# Patient Record
Sex: Female | Born: 1990
Health system: Southern US, Community
[De-identification: ages and names within clinical notes are randomized; demographics above are authoritative.]

## PROBLEM LIST (undated history)

## (undated) ENCOUNTER — Inpatient Hospital Stay (HOSPITAL_COMMUNITY): Payer: Self-pay

## (undated) ENCOUNTER — Emergency Department (HOSPITAL_COMMUNITY): Admission: EM | Payer: Medicaid Other

## (undated) DIAGNOSIS — M549 Dorsalgia, unspecified: Secondary | ICD-10-CM

## (undated) DIAGNOSIS — G43909 Migraine, unspecified, not intractable, without status migrainosus: Secondary | ICD-10-CM

## (undated) DIAGNOSIS — F209 Schizophrenia, unspecified: Secondary | ICD-10-CM

## (undated) DIAGNOSIS — D509 Iron deficiency anemia, unspecified: Secondary | ICD-10-CM

## (undated) DIAGNOSIS — E071 Dyshormogenetic goiter: Secondary | ICD-10-CM

## (undated) DIAGNOSIS — I499 Cardiac arrhythmia, unspecified: Secondary | ICD-10-CM

## (undated) DIAGNOSIS — G8929 Other chronic pain: Secondary | ICD-10-CM

## (undated) DIAGNOSIS — J45909 Unspecified asthma, uncomplicated: Secondary | ICD-10-CM

## (undated) DIAGNOSIS — M419 Scoliosis, unspecified: Secondary | ICD-10-CM

## (undated) DIAGNOSIS — F329 Major depressive disorder, single episode, unspecified: Secondary | ICD-10-CM

## (undated) DIAGNOSIS — Z9889 Other specified postprocedural states: Secondary | ICD-10-CM

## (undated) DIAGNOSIS — N83209 Unspecified ovarian cyst, unspecified side: Secondary | ICD-10-CM

## (undated) DIAGNOSIS — T4145XA Adverse effect of unspecified anesthetic, initial encounter: Secondary | ICD-10-CM

## (undated) DIAGNOSIS — K219 Gastro-esophageal reflux disease without esophagitis: Secondary | ICD-10-CM

## (undated) DIAGNOSIS — F32A Depression, unspecified: Secondary | ICD-10-CM

## (undated) DIAGNOSIS — O139 Gestational [pregnancy-induced] hypertension without significant proteinuria, unspecified trimester: Secondary | ICD-10-CM

## (undated) DIAGNOSIS — F419 Anxiety disorder, unspecified: Secondary | ICD-10-CM

## (undated) DIAGNOSIS — T8859XA Other complications of anesthesia, initial encounter: Secondary | ICD-10-CM

## (undated) HISTORY — PX: UPPER GI ENDOSCOPY: SHX6162

---
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
## 2011-02-11 ENCOUNTER — Emergency Department (INDEPENDENT_AMBULATORY_CARE_PROVIDER_SITE_OTHER)
Admission: EM | Admit: 2011-02-11 | Discharge: 2011-02-11 | Disposition: A | Discharge status: Home / Self Care | Payer: Self-pay | Source: Home / Self Care | Attending: Emergency Medicine | Admitting: Emergency Medicine

## 2011-02-11 ENCOUNTER — Encounter (HOSPITAL_COMMUNITY): Payer: Self-pay | Admitting: Emergency Medicine

## 2011-02-11 DIAGNOSIS — R109 Unspecified abdominal pain: Secondary | ICD-10-CM

## 2011-02-11 LAB — POCT URINALYSIS DIP (DEVICE)
Nitrite: NEGATIVE
Protein, ur: NEGATIVE mg/dL
Specific Gravity, Urine: 1.025 (ref 1.005–1.030)
Urobilinogen, UA: 1 mg/dL (ref 0.0–1.0)
pH: 5 (ref 5.0–8.0)

## 2011-02-11 LAB — WET PREP, GENITAL: Yeast Wet Prep HPF POC: NONE SEEN

## 2011-02-11 NOTE — ED Provider Notes (Signed)
History     CSN: [id number]  Arrival date & time 02/11/11  1341   First MD Initiated Contact with Patient 02/11/11 1410      Chief Complaint  Patient presents with  . Abdominal Pain    (Consider location/radiation/quality/duration/timing/severity/associated sxs/prior treatment) HPI Comments: Patient with intermittent nonradiating midline lower and left lower quadrant pain x1 week. Patient states pain lasts for several minutes and then resolves. Patient not affected with movement, eating, defecation, urination. No abdominal distention, fevers, vaginal bleeding, vaginal discharge, urinary complaints. Last bowel movement earlier today, and was within normal limits for patient. Taking it 100 mg of ibuprofen with mild relief. Patient has a history of irregular menses, last menstrual period in 11 2012. Patient sexually active with one female partner, who is asymptomatic. Does not use condoms, as is allergic to latex. Patient does not use any other form of birth control. Patient has not yet been seen by on OB/GYN but has a referral from her primary care physician..  The history is provided by the patient.    Past Medical History  Diagnosis Date  . Gastric reflux   . Arthritis   . Hyperthyroidism     History reviewed. No pertinent past surgical history.  History reviewed. No pertinent family history.  History  Substance Use Topics  . Smoking status: Passive Smoker  . Smokeless tobacco: Never Used  . Alcohol Use: No    OB History    Grav Para Term Preterm Abortions TAB SAB Ect Mult Living                  Review of Systems  Allergies  Augmentin; Keflex; Latex; and Vicodin  Home Medications   Current Outpatient Rx  Name Route Sig Dispense Refill  . BETHANECHOL CHLORIDE 5 MG PO TABS Oral Take 5 mg by mouth 3 (three) times daily.      . IBUPROFEN 800 MG PO TABS Oral Take 800 mg by mouth every 8 (eight) hours as needed.    Marland Kitchen LANSOPRAZOLE 30 MG PO CPDR Oral Take 30 mg by mouth  2 (two) times daily.      Marland Kitchen SYNTHROID 150 MCG PO TABS  TAKE ONE TABLET BY MOUTH DAILY 30 tablet 0  . ARIPIPRAZOLE 5 MG PO TABS Oral Take 5 mg by mouth daily.      Marland Kitchen FLUOXETINE HCL 20 MG PO TABS Oral Take 20 mg by mouth daily.        BP 117/83  Pulse 106  Temp(Src) 99.4 F (37.4 C) (Oral)  Resp 20  SpO2 100%  LMP 12/15/2010  Physical Exam  Nursing note and vitals reviewed. Constitutional: She is oriented to person, place, and time. She appears well-developed and well-nourished. No distress.  HENT:  Head: Normocephalic and atraumatic.  Eyes: Conjunctivae and EOM are normal.  Neck: Normal range of motion. Neck supple.  Cardiovascular: Normal rate, regular rhythm and normal heart sounds.   Pulmonary/Chest: Effort normal and breath sounds normal.  Abdominal: Soft. Bowel sounds are normal. She exhibits no distension. There is tenderness in the left lower quadrant. There is no rigidity, no rebound, no guarding, no CVA tenderness and negative Murphy's sign.  Genitourinary: Uterus normal. Pelvic exam was performed with patient supine. There is no rash on the right labia. There is no rash on the left labia. Uterus is not tender. Cervix exhibits no motion tenderness, no discharge and no friability. Right adnexum displays no mass, no tenderness and no fullness. Left adnexum displays tenderness. Left  adnexum displays no mass and no fullness. No erythema, tenderness or bleeding around the vagina. No foreign body around the vagina. No vaginal discharge found.       Thin white oderous/nonoderous  vaginal d/c.Chaperone present during exam  Musculoskeletal: Normal range of motion.  Neurological: She is alert and oriented to person, place, and time.  Skin: Skin is warm and dry.  Psychiatric: She has a normal mood and affect. Her behavior is normal. Judgment and thought content normal.    ED Course  Procedures (including critical care time)  Labs Reviewed  POCT URINALYSIS DIP (DEVICE) - Abnormal;  Notable for the following:    Bilirubin Urine SMALL (*)    Ketones, ur TRACE (*)    Hgb urine dipstick TRACE (*)    All other components within normal limits  POCT PREGNANCY, URINE  POCT PREGNANCY, URINE  POCT URINALYSIS DIPSTICK  POCT URINALYSIS DIPSTICK  WET PREP, GENITAL  POCT PREGNANCY, URINE  GC/CHLAMYDIA PROBE AMP, GENITAL   No results found.   1. Abdominal pain     Results for orders placed during the hospital encounter of 02/11/11  POCT URINALYSIS DIP (DEVICE)      Component Value Range   Glucose, UA NEGATIVE  NEGATIVE (mg/dL)   Bilirubin Urine SMALL (*) NEGATIVE    Ketones, ur TRACE (*) NEGATIVE (mg/dL)   Specific Gravity, Urine 1.025  1.005 - 1.030    Hgb urine dipstick TRACE (*) NEGATIVE    pH 5.0  5.0 - 8.0    Protein, ur NEGATIVE  NEGATIVE (mg/dL)   Urobilinogen, UA 1.0  0.0 - 1.0 (mg/dL)   Nitrite NEGATIVE  NEGATIVE    Leukocytes, UA NEGATIVE  NEGATIVE   POCT PREGNANCY, URINE      Component Value Range   Preg Test, Ur NEGATIVE       MDM  Abdomen soft, nontender, nondistended. Mild left adnexal pain. No left lower quadrant pain. McMurray's point negative. Send off for gonorrhea, chlamydia, wet prep. Think that this is most likely mittelschmerz.  no evidence of torsion based on H&P.patient appropriate for outpatient ultrasound. Discussed with patient and mother the symptoms of appendicitis,  ovarian torsion. Discussed when she needs to return to the ED. Patient agrees with plan.   Luiz Blare, MD 02/11/11 1743

## 2011-02-11 NOTE — ED Notes (Signed)
Pt having nausea and vomiting for one month. Stomach pain that started about 1 week ago. She has been urinating more, no dysuria. She sometimes has discharge, but not often. She last had her period 12/15/10, but states it's not always completely regular. She states she had 1 day of period 01/14/11 but after one day it went away which is unusual.

## 2011-02-12 LAB — GC/CHLAMYDIA PROBE AMP, GENITAL
Chlamydia, DNA Probe: NEGATIVE
GC Probe Amp, Genital: NEGATIVE

## 2011-02-20 ENCOUNTER — Other Ambulatory Visit: Payer: Self-pay | Admitting: *Deleted

## 2011-02-20 DIAGNOSIS — E038 Other specified hypothyroidism: Secondary | ICD-10-CM

## 2011-02-21 LAB — T3, FREE: T3, Free: 3 pg/mL (ref 2.3–4.2)

## 2011-02-21 LAB — TSH: TSH: 0.431 u[IU]/mL (ref 0.350–4.500)

## 2011-02-27 ENCOUNTER — Encounter (HOSPITAL_COMMUNITY): Payer: Self-pay | Admitting: Psychology

## 2011-02-27 DIAGNOSIS — F39 Unspecified mood [affective] disorder: Secondary | ICD-10-CM

## 2011-02-27 NOTE — Progress Notes (Signed)
Outpatient Therapist Discharge Summary  Kimberly [name]    10/22/1990   Admission Date: 07/31/10   Discharge Date:  02/27/11 Reason for Discharge:  Not active w/ treatment; No shows Medications:  See chart Diagnosis:  Axis I:   1. Unspecified episodic mood disorder     Axis II:  v71.09  Axis III:  hypothyroid  Axis IV:  Primary supports and social  Axis V:  Unknown at d/c  Comments:  Last attended counseling on 09/10/10; 2 noshows  Forde Radon

## 2011-03-01 ENCOUNTER — Telehealth: Payer: Self-pay | Admitting: "Endocrinology

## 2011-03-01 DIAGNOSIS — F329 Major depressive disorder, single episode, unspecified: Secondary | ICD-10-CM | POA: Insufficient documentation

## 2011-03-01 DIAGNOSIS — E063 Autoimmune thyroiditis: Secondary | ICD-10-CM | POA: Insufficient documentation

## 2011-03-01 DIAGNOSIS — L83 Acanthosis nigricans: Secondary | ICD-10-CM

## 2011-03-01 DIAGNOSIS — K219 Gastro-esophageal reflux disease without esophagitis: Secondary | ICD-10-CM | POA: Insufficient documentation

## 2011-03-01 DIAGNOSIS — N915 Oligomenorrhea, unspecified: Secondary | ICD-10-CM

## 2011-03-01 DIAGNOSIS — E282 Polycystic ovarian syndrome: Secondary | ICD-10-CM | POA: Insufficient documentation

## 2011-03-01 DIAGNOSIS — L680 Hirsutism: Secondary | ICD-10-CM | POA: Insufficient documentation

## 2011-03-01 DIAGNOSIS — F319 Bipolar disorder, unspecified: Secondary | ICD-10-CM | POA: Insufficient documentation

## 2011-03-01 DIAGNOSIS — E049 Nontoxic goiter, unspecified: Secondary | ICD-10-CM | POA: Insufficient documentation

## 2011-03-01 DIAGNOSIS — I10 Essential (primary) hypertension: Secondary | ICD-10-CM | POA: Insufficient documentation

## 2011-03-01 DIAGNOSIS — R1013 Epigastric pain: Secondary | ICD-10-CM | POA: Insufficient documentation

## 2011-03-01 HISTORY — DX: Acanthosis nigricans: L83

## 2011-03-01 HISTORY — DX: Oligomenorrhea, unspecified: N91.5

## 2011-03-01 NOTE — Telephone Encounter (Signed)
I tried to contact the mother. Her home phone will not accept incoming calls. I left a VM message on her cell phone. 1. TFTs from June, whih we could not locate in October, were normal.

## 2011-03-18 ENCOUNTER — Telehealth: Payer: Self-pay | Admitting: *Deleted

## 2011-03-18 NOTE — Telephone Encounter (Signed)
Spoke with Mother: 1. Kimberly [name] is currently on brand name Synthroid 150 mcg daily. 2. No insurance at the moment.  Mother is trying to get Medicaid reinstated. 3. Requests samples of Synthroid 150 mcg.  Discussed above with Dr. Fransico Michael.  We do not have any samples of Synthroid 150 mcg, but we do have Synthroid 125 mcg samples. Per Dr. Fransico Michael: 1. Take Synthroid 125 mcg 1 tablets daily Monday - Friday (5 days/week). 2. Saturday and Sunday take 1.5 tablets of Synthroid 125 mcg (2 days/week). 3. Samples will be left at [facility] for mother to pick up.

## 2011-04-06 ENCOUNTER — Other Ambulatory Visit: Payer: Self-pay | Admitting: *Deleted

## 2011-04-06 DIAGNOSIS — E038 Other specified hypothyroidism: Secondary | ICD-10-CM

## 2011-04-22 ENCOUNTER — Emergency Department (HOSPITAL_COMMUNITY)
Admission: EM | Admit: 2011-04-22 | Discharge: 2011-04-22 | Disposition: A | Discharge status: Home / Self Care | Payer: Self-pay | Attending: Emergency Medicine | Admitting: Emergency Medicine

## 2011-04-22 ENCOUNTER — Emergency Department (HOSPITAL_COMMUNITY): Payer: Self-pay

## 2011-04-22 ENCOUNTER — Encounter (HOSPITAL_COMMUNITY): Payer: Self-pay | Admitting: Emergency Medicine

## 2011-04-22 DIAGNOSIS — E0590 Thyrotoxicosis, unspecified without thyrotoxic crisis or storm: Secondary | ICD-10-CM | POA: Insufficient documentation

## 2011-04-22 DIAGNOSIS — R1915 Other abnormal bowel sounds: Secondary | ICD-10-CM | POA: Insufficient documentation

## 2011-04-22 DIAGNOSIS — R1031 Right lower quadrant pain: Secondary | ICD-10-CM | POA: Insufficient documentation

## 2011-04-22 DIAGNOSIS — N83209 Unspecified ovarian cyst, unspecified side: Secondary | ICD-10-CM | POA: Insufficient documentation

## 2011-04-22 DIAGNOSIS — R112 Nausea with vomiting, unspecified: Secondary | ICD-10-CM | POA: Insufficient documentation

## 2011-04-22 DIAGNOSIS — Z79899 Other long term (current) drug therapy: Secondary | ICD-10-CM | POA: Insufficient documentation

## 2011-04-22 DIAGNOSIS — R10819 Abdominal tenderness, unspecified site: Secondary | ICD-10-CM | POA: Insufficient documentation

## 2011-04-22 LAB — URINALYSIS, ROUTINE W REFLEX MICROSCOPIC
Bilirubin Urine: NEGATIVE
Glucose, UA: NEGATIVE mg/dL
Ketones, ur: 15 mg/dL — AB
Leukocytes, UA: NEGATIVE
Nitrite: NEGATIVE
Protein, ur: NEGATIVE mg/dL
Specific Gravity, Urine: 1.03 (ref 1.005–1.030)
Urobilinogen, UA: 1 mg/dL (ref 0.0–1.0)
pH: 6 (ref 5.0–8.0)

## 2011-04-22 LAB — URINE MICROSCOPIC-ADD ON

## 2011-04-22 LAB — BASIC METABOLIC PANEL
BUN: 9 mg/dL (ref 6–23)
CO2: 23 mEq/L (ref 19–32)
Calcium: 9.3 mg/dL (ref 8.4–10.5)
Chloride: 104 mEq/L (ref 96–112)
Creatinine, Ser: 0.48 mg/dL — ABNORMAL LOW (ref 0.50–1.10)
GFR calc Af Amer: >90 mL/min (ref >90)
GFR calc non Af Amer: >90 mL/min (ref >90)
Glucose, Bld: 100 mg/dL — ABNORMAL HIGH (ref 70–99)
Potassium: 3.6 mEq/L (ref 3.5–5.1)
Sodium: 140 mEq/L (ref 135–145)

## 2011-04-22 LAB — WET PREP, GENITAL
Clue Cells Wet Prep HPF POC: NONE SEEN
Trich, Wet Prep: NONE SEEN
Yeast Wet Prep HPF POC: NONE SEEN

## 2011-04-22 LAB — CBC
HCT: 35 % — ABNORMAL LOW (ref 36.0–46.0)
Hemoglobin: 11.5 g/dL — ABNORMAL LOW (ref 12.0–15.0)
MCH: 27.1 pg (ref 26.0–34.0)
MCHC: 32.9 g/dL (ref 30.0–36.0)
MCV: 82.4 fL (ref 78.0–100.0)
Platelets: 368 10*3/uL (ref 150–400)
RBC: 4.25 MIL/uL (ref 3.87–5.11)
RDW: 13.4 % (ref 11.5–15.5)
WBC: 15.8 10*3/uL — ABNORMAL HIGH (ref 4.0–10.5)

## 2011-04-22 LAB — GC/CHLAMYDIA PROBE AMP, GENITAL
Chlamydia, DNA Probe: NEGATIVE
GC Probe Amp, Genital: NEGATIVE

## 2011-04-22 MED ORDER — ONDANSETRON HCL 4 MG/2ML IJ SOLN
4.0000 mg | Freq: Once | INTRAMUSCULAR | Status: AC
  Administered 2011-04-22: 4 mg via INTRAVENOUS
  Filled 2011-04-22: qty 2

## 2011-04-22 MED ORDER — OXYCODONE-ACETAMINOPHEN 5-325 MG PO TABS: 1.0000 | ORAL_TABLET | ORAL | Status: AC | PRN

## 2011-04-22 MED ORDER — MORPHINE SULFATE 4 MG/ML IJ SOLN
8.0000 mg | Freq: Once | INTRAMUSCULAR | Status: AC
  Administered 2011-04-22: 8 mg via INTRAVENOUS
  Filled 2011-04-22: qty 2

## 2011-04-22 MED ORDER — SODIUM CHLORIDE 0.9 % IV BOLUS (SEPSIS)
1000.0000 mL | Freq: Once | INTRAVENOUS | Status: AC
  Administered 2011-04-22: 1000 mL via INTRAVENOUS

## 2011-04-22 MED ORDER — IOHEXOL 300 MG/ML  SOLN
100.0000 mL | Freq: Once | INTRAMUSCULAR | Status: AC | PRN
  Administered 2011-04-22: 100 mL via INTRAVENOUS

## 2011-04-22 MED ORDER — IOHEXOL 300 MG/ML  SOLN: 20.0000 mL | INTRAMUSCULAR | Status: AC

## 2011-04-22 NOTE — ED Notes (Signed)
Pt discharged to home with mother in stable condition. Pt wheeled to car in wheelchair, pt's mother is driving. Discharge instructions provided to pt. Pt denies any questions at this time.

## 2011-04-22 NOTE — ED Notes (Signed)
Pt reports lower abdominal pain that is cramping; intermittent; [place]. LMP was 3/27th. No vaginal discharge. Vomited a few days ago that pt reports was associated with the pain. Appetite is lessened.

## 2011-04-22 NOTE — ED Notes (Signed)
CT notified that pt has completed contrast °

## 2011-04-22 NOTE — Discharge Instructions (Signed)
Abdominal Pain  Abdominal pain can be caused by many things. Your caregiver decides the seriousness of your pain by an examination and possibly blood tests and X-rays. Many cases can be observed and treated at home. Most abdominal pain is not caused by a disease and will probably improve without treatment. However, in many cases, more time must pass before a clear cause of the pain can be found. Before that point, it may not be known if you need more testing, or if hospitalization or surgery is needed.  HOME CARE INSTRUCTIONS    Do not take laxatives unless directed by your caregiver.   Take pain medicine only as directed by your caregiver.   Only take over-the-counter or prescription medicines for pain, discomfort, or fever as directed by your caregiver.   Try a clear liquid diet (broth, tea, or water) for as long as directed by your caregiver. Slowly move to a bland diet as tolerated.  SEEK IMMEDIATE MEDICAL CARE IF:    The pain does not go away.   You have a fever.   You keep throwing up (vomiting).   The pain is felt only in portions of the abdomen. Pain in the right side could possibly be appendicitis. In an adult, pain in the left lower portion of the abdomen could be colitis or diverticulitis.   You pass bloody or black tarry stools.  MAKE SURE YOU:    Understand these instructions.   Will watch your condition.   Will get help right away if you are not doing well or get worse.  Document Released: 10/15/2004 Document Revised: 12/25/2010 Document Reviewed: 08/24/2007  Exit[employer] Patient Information 2012 [employer]  Ovarian Cyst  The ovaries are small organs that are on each side of the uterus. The ovaries are the organs that produce the female hormones, estrogen and progesterone. An ovarian cyst is a sac filled with fluid that can vary in its size. It is normal for a small cyst to form in women who are in the childbearing age and who have menstrual periods. This type of cyst is called a follicle  cyst that becomes an ovulation cyst (corpus luteum cyst) after it produces the women's egg. It later goes away on its own if the woman does not become pregnant. There are other kinds of ovarian cysts that may cause problems and may need to be treated. The most serious problem is a cyst with cancer. It should be noted that menopausal women who have an ovarian cyst are at a higher risk of it being a cancer cyst. They should be evaluated very quickly, thoroughly and followed closely. This is especially true in menopausal women because of the high rate of ovarian cancer in women in menopause.  CAUSES AND TYPES OF OVARIAN CYSTS:   FUNCTIONAL CYST: The follicle/corpus luteum cyst is a functional cyst that occurs every month during ovulation with the menstrual cycle. They go away with the next menstrual cycle if the woman does not get pregnant. Usually, there are no symptoms with a functional cyst.   ENDOMETRIOMA CYST: This cyst develops from the lining of the uterus tissue. This cyst gets in or on the ovary. It grows every month from the bleeding during the menstrual period. It is also called a "chocolate cyst" because it becomes filled with blood that turns brown. This cyst can cause pain in the lower abdomen during intercourse and with your menstrual period.   CYSTADENOMA CYST: This cyst develops from the cells on the outside   of the ovary. They usually are not cancerous. They can get very big and cause lower abdomen pain and pain with intercourse. This type of cyst can twist on itself, cut off its blood supply and cause severe pain. It also can easily rupture and cause a lot of pain.   DERMOID CYST: This type of cyst is sometimes found in both ovaries. They are found to have different kinds of body tissue in the cyst. The tissue includes skin, teeth, hair, and/or cartilage. They usually do not have symptoms unless they get very big. Dermoid cysts are rarely cancerous.   POLYCYSTIC OVARY: This is a rare condition  with hormone problems that produces many small cysts on both ovaries. The cysts are follicle-like cysts that never produce an egg and become a corpus luteum. It can cause an increase in body weight, infertility, acne, increase in body and facial hair and lack of menstrual periods or rare menstrual periods. Many women with this problem develop type 2 diabetes. The exact cause of this problem is unknown. A polycystic ovary is rarely cancerous.   THECA LUTEIN CYST: Occurs when too much hormone (human chorionic gonadotropin) is produced and over-stimulates the ovaries to produce an egg. They are frequently seen when doctors stimulate the ovaries for invitro-fertilization (test tube babies).   LUTEOMA CYST: This cyst is seen during pregnancy. Rarely it can cause an obstruction to the birth canal during labor and delivery. They usually go away after delivery.  SYMPTOMS    Pelvic pain or pressure.   Pain during sexual intercourse.   Increasing girth (swelling) of the abdomen.   Abnormal menstrual periods.   Increasing pain with menstrual periods.   You stop having menstrual periods and you are not pregnant.  DIAGNOSIS   The diagnosis can be made during:   Routine or annual pelvic examination (common).   Ultrasound.   X-ray of the pelvis.   CT Scan.   MRI.   Blood tests.  TREATMENT    Treatment may only be to follow the cyst monthly for 2 to 3 months with your caregiver. Many go away on their own, especially functional cysts.   May be aspirated (drained) with a long needle with ultrasound, or by laparoscopy (inserting a tube into the pelvis through a small incision).   The whole cyst can be removed by laparoscopy.   Sometimes the cyst may need to be removed through an incision in the lower abdomen.   Hormone treatment is sometimes used to help dissolve certain cysts.   Birth control pills are sometimes used to help dissolve certain cysts.  HOME CARE INSTRUCTIONS   Follow your caregiver's advice  regarding:   Medicine.   Follow up visits to evaluate and treat the cyst.   You may need to come back or make an appointment with another caregiver, to find the exact cause of your cyst, if your caregiver is not a gynecologist.   Get your yearly and recommended pelvic examinations and Pap tests.   Let your caregiver know if you have had an ovarian cyst in the past.  SEEK MEDICAL CARE IF:    Your periods are late, irregular, they stop, or are painful.   Your stomach (abdomen) or pelvic pain does not go away.   Your stomach becomes larger or swollen.   You have pressure on your bladder or trouble emptying your bladder completely.   You have painful sexual intercourse.   You have feelings of fullness, pressure, or discomfort in your   stomach.   You lose weight for no apparent reason.   You feel generally ill.   You become constipated.   You lose your appetite.   You develop acne.   You have an increase in body and facial hair.   You are gaining weight, without changing your exercise and eating habits.   You think you are pregnant.  SEEK IMMEDIATE MEDICAL CARE IF:    You have increasing abdominal pain.   You feel sick to your stomach (nausea) and/or vomit.   You develop a fever that comes on suddenly.   You develop abdominal pain during a bowel movement.   Your menstrual periods become heavier than usual.  Document Released: 01/05/2005 Document Revised: 12/25/2010 Document Reviewed: 11/08/2008  [employer] Patient Information 2012 [employer].

## 2011-04-22 NOTE — ED Notes (Signed)
Pt complains of RLQ pain with nausea/ vomiting for approximately 30 mins. Pt denies diarrhea or urinary symptoms. Pt has tenderness to RLQ.

## 2011-04-25 LAB — T4, FREE: Free T4: 1.47 ng/dL (ref 0.80–1.80)

## 2011-04-28 ENCOUNTER — Encounter: Payer: Self-pay | Admitting: "Endocrinology

## 2011-04-28 ENCOUNTER — Ambulatory Visit (INDEPENDENT_AMBULATORY_CARE_PROVIDER_SITE_OTHER): Payer: Self-pay | Admitting: "Endocrinology

## 2011-04-28 VITALS — BP 109/64 | HR 96 | Wt 221.4 lb

## 2011-04-28 DIAGNOSIS — R1013 Epigastric pain: Secondary | ICD-10-CM

## 2011-04-28 DIAGNOSIS — K3189 Other diseases of stomach and duodenum: Secondary | ICD-10-CM

## 2011-04-28 DIAGNOSIS — E063 Autoimmune thyroiditis: Secondary | ICD-10-CM

## 2011-04-28 DIAGNOSIS — I10 Essential (primary) hypertension: Secondary | ICD-10-CM

## 2011-04-28 DIAGNOSIS — R7303 Prediabetes: Secondary | ICD-10-CM

## 2011-04-28 DIAGNOSIS — F419 Anxiety disorder, unspecified: Secondary | ICD-10-CM

## 2011-04-28 DIAGNOSIS — L680 Hirsutism: Secondary | ICD-10-CM

## 2011-04-28 DIAGNOSIS — E669 Obesity, unspecified: Secondary | ICD-10-CM

## 2011-04-28 DIAGNOSIS — D649 Anemia, unspecified: Secondary | ICD-10-CM

## 2011-04-28 DIAGNOSIS — F329 Major depressive disorder, single episode, unspecified: Secondary | ICD-10-CM

## 2011-04-28 DIAGNOSIS — N915 Oligomenorrhea, unspecified: Secondary | ICD-10-CM

## 2011-04-28 DIAGNOSIS — E038 Other specified hypothyroidism: Secondary | ICD-10-CM

## 2011-04-28 DIAGNOSIS — E049 Nontoxic goiter, unspecified: Secondary | ICD-10-CM

## 2011-04-28 DIAGNOSIS — F32A Depression, unspecified: Secondary | ICD-10-CM

## 2011-04-28 DIAGNOSIS — R7309 Other abnormal glucose: Secondary | ICD-10-CM

## 2011-04-28 DIAGNOSIS — F341 Dysthymic disorder: Secondary | ICD-10-CM

## 2011-04-28 NOTE — Patient Instructions (Addendum)
Followup visit in 3 months. Please continue the eat right diet. Please resume walking for an hour or more per day. Please reduce Synthroid to one 137 mcg tablet, 6 days per week, skip Sunday. Please drink at least 88 ounce glasses of water per day.

## 2011-04-28 NOTE — Progress Notes (Signed)
CC: FU hypothyroid, thyroiditis, goiter, obesity, pre-DM, acanthosis, oligomenorrhea, hirsutism, hypertension, anxiety/depression, dyspepsia, GERD  HPI: 21 y.o. Caucasian young woman, accompanied by mom. 1. I have been following this patient since 06.30.08 for the above issues. Despite being intolerant to metformin, she progressively lost weight  down to 195 lbs by April 2010, but then began to slowly re-gain the weight. Her changes in thyroid hormone requirement and Synthroid dose have paralleled her weight changes. In 2008 her menses became more irregular, painful, and heavy. She also developed some facial hair of the upper lip, sideburns areas, and cheeks. Her GYN started her on Yaz and her PCOS-type complaints all improved. Unfortunately, she and her mother became concerned about the ads from lawyers about Dianah Field and so she took herself off Yaz several months ago. It took several months for her to then have a period and the mild facial hair problems recurred.  2. During the Spring of 2012 she was evaluated by Dr. Lucianne Muss, staff psychiatrist at [facility]. Dr. Lucianne Muss prescribed Abilify. Unfortunately, Latashia became very hungry and gained 10 lbs. She also became so apathetic that Dr. Lucianne Muss discontinued the Abilify and the Prozac she was taking as well.  Those changes resulted in her being paranoid, anxious, scared of interacting with others, and reclusive. She was not sleeping well and was very fatigued.  3. Her last [facility] clinic visit was on 10/28/10. She has had several colds during the Winter. On 04/22/11 she had severe RLQ pains, nausea, vomiting, and diarrhea. She was taken to the ED, where she was evaluated for possible appendicitis. CT scan was c/w mesenteric adenitis. There was also a suggestion of possible ovarian cyst rupture. She still has some residual diarrhea and RLQ pain, but is improved overall.  4. Pertinent Review of Systems: Constitutional  She has been quite depressed,  apathetic, and withdrawn. She no longer has Medicaid or any other insurance.  Eyes: She has been having more visual blurring lately. She saw Dr. Maple Hudson about 10 months ago. He did not think that she needed glasses at that point. I suggested that she may need to see him again, when she can afford to.  Neck: She sometimes has some difficulty swallowing. She has not had any recent problems with thyroid gland swelling, soreness, pain, or pressure.  Heart: She sometimes notes fast heart rates when she is anxious.  Gastrointestinal: She is often very hungry, but often feels too nauseated to eat. Bowel movents seem normal.  Legs: Muscle mass and strength seem normal. She complains of "random  numbness" of both legs.  Feet: She also has random numbness of the feet. GYN: LMP two weeks ago. Menses are quite heavy. She is not taking any oral contraceptives at this time.  PAST MEDICAL, FAMILY, AND SOCIAL HISTORY  1. Work and family: The patient is still unemployed. Her mood issues usually preclude her from interacting successfully with others. She was able to work for 1-1/2 months at [employer], but when business slowed down, she was terminated. - 2. Activities: She has not been exercising recently. 3. Primary care provider: She still has not obtained an adult  primary care provider.    ROS: There are no other significant problems involving her other body systems.  PHYSICAL EXAM: BP 109/64  Pulse 96  Wt 221 lb 6.4 oz (100.426 kg)  LMP 04/15/2011 Weight >> 97%. She has gained one pound since last visit. Constitutional: The patient looks pretty good today.  Eyes: There is  no arcus or proptosis.  Face: She has just a trace of moustache. She has some longer, slender dark sideburns hairs, about a 1+ score. Mouth: The oropharynx appears normal. The tongue appears normal. There is normal oral moisture. There is no obvious gingivitis. Neck: There are no bruits present. The thyroid gland appears normal. The  thyroid gland is approximately 20- 20+ grams in size. The right lobe is within normal. The left lobe is slightly enlarged. The consistency of the thyroid gland is normal. The thyroid gland was not tender to palpation today.  Lungs: The lungs are clear. Air movement is good. Heart: The heart rhythm and rate appear normal. Heart sounds S1 and S2 are normal. I do not appreciate any pathologic heart murmurs. Abdomen: The abdominal size is enlarged. Bowel sounds are normal. The abdomen is soft, but is mildly tender in the RLQ. There is no obviously palpable hepatomegaly, splenomegaly, or other masses.  Arms: Muscle mass appears appropriate for age.  Hands: There is no obvious tremor. Phalangeal and metacarpophalangeal joints appear normal. Palms show 1+ erythema.  Legs: Muscle mass appears appropriate for age. There is no edema.   Neurologic: Muscle strength is normal for age and gender  in both the upper and the lower extremities. Muscle tone appears normal. Sensation to touch is normal in the legs.  Labs: Hemoglobin A1c today is 4.9%, compared with 4.7% at last visit.           TFTs 02/20/11: TSH 0.431, free T4 1.48, free T3 3.0           TFTs 04/22/11: TSH 0.046, free T4 1.47, free T3 3.1           CBC 04/22/11: Hgb 11.5, Hct 35%.   ASSESSMENT: 1. Hypothyroid: She was euthyroid in March of 2012, borderline hyperthyroid in February 2013, and hyperthyroid by [facility] in April 2013. However, the free T4 and free T3 did not substantially change. This pattern is c/w a recent flare up of Hashimoto's disease. 2. Obesity: Weight is essentially unchanged since last visit. 3. Pre-DM: Her hemoglobin A1c is very slightly increased, but still mid-range normal. 4. Anxiety-depression: She looks, talks, and acts pretty normally today, but not as well as at last visit.  5. Thyroiditis: Her Hashimoto's Disease is clinically quiescent. 6. Goiter: Fairly static size, now only barely enlarged. 7. Hypertension: Her systolic BP  is fine. Her diastolic BP is fine. 8. Oligomenorrhea: Her periods have been regular since stopping Yaz, but also heavier. . 9. Hirsutism: Her hirsutism is very mild. I don't think it's any more significant than it was at last visit. 10: Memory problems: These problems have not really changed. It really sounds as if she doesn't have a problem with memory, but really has a problem with her ADD and its effects on her attention. If she doesn't attend to information, especially information she hears, she can't "save" that information.  11. Dyspepsia/GERD: Doing worse again due to increased carb intake. 12. Anemia: Hgb and Hct are slightly low. MCV is still normal. She may have early iron-deficiency anemia.  PLAN: 1. Diagnostic: Iron level and TFTs when she has health insurance benefits or is enrolled in [employer] program. 2. Therapeutic: Continue the Eat Right Diet. Resume walking 45-60 minutes per day. Reduce Synthroid to one 137 mcg pills 6 days per week, skip Sunday. Drink at least 8 8 oz glasses of water daily 3. Patient education: Discussed nutrition, exercise, obesity, and depression. We also discussed the [facility] charity program.  I gave her a handout about the program. She will call me as soon as she is approved.  4. Follow-up: 3 months  Level of Service: This visit lasted in excess of 40 minutes. More than 50% of the visit was devoted to counseling.  David Stall

## 2011-04-30 ENCOUNTER — Ambulatory Visit (INDEPENDENT_AMBULATORY_CARE_PROVIDER_SITE_OTHER): Payer: Self-pay | Admitting: Psychiatry

## 2011-04-30 ENCOUNTER — Encounter (HOSPITAL_COMMUNITY): Payer: Self-pay | Admitting: Psychiatry

## 2011-04-30 VITALS — BP 110/65 | HR 90 | Ht 64.0 in | Wt 220.8 lb

## 2011-04-30 DIAGNOSIS — F988 Other specified behavioral and emotional disorders with onset usually occurring in childhood and adolescence: Secondary | ICD-10-CM

## 2011-04-30 DIAGNOSIS — F410 Panic disorder [episodic paroxysmal anxiety] without agoraphobia: Secondary | ICD-10-CM

## 2011-04-30 DIAGNOSIS — F411 Generalized anxiety disorder: Secondary | ICD-10-CM

## 2011-04-30 DIAGNOSIS — F341 Dysthymic disorder: Secondary | ICD-10-CM

## 2011-04-30 MED ORDER — FLUOXETINE HCL 40 MG PO CAPS: 40.0000 mg | ORAL_CAPSULE | Freq: Every day | ORAL | Status: DC

## 2011-04-30 NOTE — Progress Notes (Signed)
[facility] [id number] Progress Note  Kimberly [name] [id number] 21 y.o.  04/30/2011 9:25 AM  Chief Complaint: I'm doing better with my depression but still suffer from anxiety at times.  History of Present Illness: Patient is a 21 year old female diagnosed with panic disorder and dysthymic disorder who presents today for a followup visit.  Patient says that she is doing well in regards to her depression. She however continues to struggle on and off with anxiety and panic-like symptoms and feels that her Prozac needs to be increased. She currently is unemployed as she was let go from her job due to decrease in the business. She however reports that her ex-employer did call her to ask her if she could work during the summer which is their busy season. She is also currently looking for jobs.  In regards to her medical problems which include her being prediabetic, having goiter, hypertension, GERD and oligomenorrhea patient says that she is stable.  Patient says that she currently does not have insurance as she has applied for Medicaid a few times and has been turned down. She adds that she was to be on medications which she can afford to buy. She denies any other complaints at this visit, any safety issues. Suicidal Ideation: No Plan Formed: No Patient has means to carry out plan: No  Homicidal Ideation: No Plan Formed: No Patient has means to carry out plan: No  Review of Systems: Psychiatric: Agitation: No Hallucination: No Depressed Mood: No Insomnia: No Hypersomnia: No Altered Concentration: No Feels Worthless: No Grandiose Ideas: No Belief In Special Powers: No New/Increased Substance Abuse: No Compulsions: No  Neurologic: Headache: No Seizure: No Paresthesias: No  Past Medical Family, Social History: Lives with mom and her sister and Kimberly [name]. Patient is currently unemployed  Outpatient Encounter Prescriptions as of 04/30/2011  Medication Sig  Dispense Refill  . bethanechol (URECHOLINE) 5 MG tablet Take 5 mg by mouth 3 (three) times daily.        Marland Kitchen FLUoxetine (PROZAC) 40 MG capsule Take 1 capsule (40 mg total) by mouth daily.  90 capsule  1  . lansoprazole (PREVACID) 30 MG capsule Take 30 mg by mouth 2 (two) times daily.        Marland Kitchen oxyCODONE-acetaminophen (PERCOCET) 5-325 MG per tablet Take 1-2 tablets by mouth every 4 (four) hours as needed for pain.  10 tablet  0  . SYNTHROID 150 MCG tablet TAKE ONE TABLET BY MOUTH DAILY  30 tablet  0  . DISCONTD: FLUoxetine (PROZAC) 20 MG tablet Take 20 mg by mouth daily.          Past Psychiatric History/Hospitalization(s): Anxiety: Yes Bipolar Disorder: No Depression: Yes Mania: No Psychosis: No Schizophrenia: No Personality Disorder: No Hospitalization for psychiatric illness: No History of Electroconvulsive Shock Therapy: No Prior Suicide Attempts: No  Physical Exam: Constitutional:  BP 110/65  Pulse 90  Ht 5\' 4"  (1.626 m)  Wt 220 lb 12.8 oz (100.154 kg)  BMI 37.90 kg/m2  LMP 04/15/2011  General Appearance: alert, oriented, no acute distress and obese  Musculoskeletal: Strength & Muscle Tone: within normal limits Gait & Station: normal Patient leans: N/A  Psychiatric: Speech (describe rate, volume, coherence, spontaneity, and abnormalities if any): Normal in volume, rate, tone, spontaneous   Thought Process (describe rate, content, abstract reasoning, and computation): Organized, goal directed, age appropriate   Associations: Intact  Thoughts: normal  Mental Status: Orientation: oriented to person, place, situation and day of week Mood & Affect:  normal affect Attention Span & Concentration: OK  Medical Decision Making (Choose Three): Review of Psycho-Social Stressors (1), Established Problem, Worsening (2), New Problem, with no additional work-up planned (3), Review of Last Therapy Session (1) and Review of New Medication or Change in Dosage (2)  Assessment: Axis  I: Panic disorder, dysthymic disorder, ADD inattentive type  Axis II: Deferred  Axis III: Obesity, goiter, thyroiditis, oligo menorrhea, prediabetes, Stein-Leventhal syndrome, hirsutism, GERD  Axis IV: Lack of employment, financial issues  Axis V: 65   Plan: Increase the Prozac to 40 mg in the morning to help with the anxiety as patient has had again problems with increased anxiety along with panic-like symptoms. Patient's depression seems to be stable Patient continues to struggle with her ADD as she reports that in her job she has to come up with ways to memorize her work so she does not forget anything. She adds that she does no want to try medications at this time as she is able to organize herself. Continue to see her medical specialist for her medical issues Call when necessary Followup in 4 weeks  Nelly Rout, MD 04/30/2011

## 2011-05-01 NOTE — ED Provider Notes (Signed)
History    20yf with with abdominal pain. RLQ. Onset a couple days ago. Initially intermittent but now more or less constant. No radiation. No fever or chills. No urinary complaints. No unusual vaginal bleeding or discharge. Does not think pregnant. Not particularly concerned for possible std.   CSN: [id number]  Arrival date & time 04/22/11  0309   First MD Initiated Contact with Patient 04/22/11 0340      Chief Complaint  Patient presents with  . Abdominal Pain    (Consider location/radiation/quality/duration/timing/severity/associated sxs/prior treatment) HPI  Past Medical History  Diagnosis Date  . Gastric reflux   . Arthritis   . Hyperthyroidism     No past surgical history on file.  No family history on file.  History  Substance Use Topics  . Smoking status: Passive Smoker  . Smokeless tobacco: Never Used  . Alcohol Use: No    OB History    Grav Para Term Preterm Abortions TAB SAB Ect Mult Living                  Review of Systems   Review of symptoms negative unless otherwise noted in HPI.   Allergies  Augmentin; Keflex; Latex; and Vicodin  Home Medications   Current Outpatient Rx  Name Route Sig Dispense Refill  . BETHANECHOL CHLORIDE 5 MG PO TABS Oral Take 5 mg by mouth 3 (three) times daily.      Marland Kitchen LANSOPRAZOLE 30 MG PO CPDR Oral Take 30 mg by mouth 2 (two) times daily.      Marland Kitchen SYNTHROID 150 MCG PO TABS  TAKE ONE TABLET BY MOUTH DAILY 30 tablet 0  . FLUOXETINE HCL 40 MG PO CAPS Oral Take 1 capsule (40 mg total) by mouth daily. 90 capsule 1  . OXYCODONE-ACETAMINOPHEN 5-325 MG PO TABS Oral Take 1-2 tablets by mouth every 4 (four) hours as needed for pain. 10 tablet 0    BP 125/69  Pulse 102  Temp(Src) 98 F (36.7 C) (Oral)  Resp 16  SpO2 99%  LMP 04/15/2011  Physical Exam  Nursing note and vitals reviewed. Constitutional: She is oriented to person, place, and time. She appears well-developed and well-nourished. No distress.  HENT:  Head:  Normocephalic and atraumatic.  Eyes: Conjunctivae are normal. Right eye exhibits no discharge. Left eye exhibits no discharge.  Neck: Neck supple.  Cardiovascular: Normal rate, regular rhythm and normal heart sounds.  Exam reveals no gallop and no friction rub.   No murmur heard. Pulmonary/Chest: Effort normal and breath sounds normal. No respiratory distress.  Abdominal: Soft. She exhibits no distension. There is tenderness. There is guarding. There is no rebound.       Moderate rlq tenderness with voluntaring guarding. No rebound. No mass.  Genitourinary:       No cva tenderness.  Musculoskeletal: She exhibits no edema and no tenderness.  Neurological: She is alert and oriented to person, place, and time.  Skin: Skin is warm and dry. She is not diaphoretic.  Psychiatric: She has a normal mood and affect. Her behavior is normal. Thought content normal.    ED Course  Procedures (including critical care time)  Labs Reviewed  URINALYSIS, ROUTINE W REFLEX MICROSCOPIC - Abnormal; Notable for the following:    APPearance CLOUDY (*)    Hgb urine dipstick TRACE (*)    Ketones, ur 15 (*)    All other components within normal limits  URINE MICROSCOPIC-ADD ON - Abnormal; Notable for the following:    Squamous  Epithelial / LPF MANY (*)    Bacteria, UA MANY (*)    Crystals CA OXALATE CRYSTALS (*)    All other components within normal limits  WET PREP, GENITAL - Abnormal; Notable for the following:    WBC, Wet Prep HPF POC FEW (*)    All other components within normal limits  CBC - Abnormal; Notable for the following:    WBC 15.8 (*)    Hemoglobin 11.5 (*)    HCT 35.0 (*)    All other components within normal limits  BASIC METABOLIC PANEL - Abnormal; Notable for the following:    Glucose, Bld 100 (*)    Creatinine, Ser 0.48 (*)    All other components within normal limits  POCT PREGNANCY, URINE  GC/CHLAMYDIA PROBE AMP, GENITAL  LAB REPORT - SCANNED   No results found.   1.  Abdominal pain   2. Ovarian cyst       MDM  20yf with abdominal pain. Possible ovarian cyst vs mesenteric adenitis. Low suspicion for surgical abdomen. Strict return precautions discussed. Outpt fu as needed.        Raeford Razor, MD 05/01/11 [phone]

## 2011-05-21 ENCOUNTER — Encounter (HOSPITAL_COMMUNITY): Payer: Self-pay

## 2011-05-21 ENCOUNTER — Ambulatory Visit (HOSPITAL_COMMUNITY): Payer: Self-pay | Admitting: Psychiatry

## 2011-06-04 ENCOUNTER — Encounter (HOSPITAL_COMMUNITY): Payer: Self-pay | Admitting: *Deleted

## 2011-06-04 ENCOUNTER — Emergency Department (HOSPITAL_COMMUNITY)
Admission: EM | Admit: 2011-06-04 | Discharge: 2011-06-05 | Disposition: A | Discharge status: Home / Self Care | Payer: Self-pay | Attending: Emergency Medicine | Admitting: Emergency Medicine

## 2011-06-04 DIAGNOSIS — K5289 Other specified noninfective gastroenteritis and colitis: Secondary | ICD-10-CM | POA: Insufficient documentation

## 2011-06-04 DIAGNOSIS — F341 Dysthymic disorder: Secondary | ICD-10-CM | POA: Insufficient documentation

## 2011-06-04 DIAGNOSIS — E0590 Thyrotoxicosis, unspecified without thyrotoxic crisis or storm: Secondary | ICD-10-CM | POA: Insufficient documentation

## 2011-06-04 DIAGNOSIS — K529 Noninfective gastroenteritis and colitis, unspecified: Secondary | ICD-10-CM

## 2011-06-04 DIAGNOSIS — Z8739 Personal history of other diseases of the musculoskeletal system and connective tissue: Secondary | ICD-10-CM | POA: Insufficient documentation

## 2011-06-04 DIAGNOSIS — R1013 Epigastric pain: Secondary | ICD-10-CM | POA: Insufficient documentation

## 2011-06-04 HISTORY — DX: Major depressive disorder, single episode, unspecified: F32.9

## 2011-06-04 HISTORY — DX: Depression, unspecified: F32.A

## 2011-06-04 HISTORY — DX: Scoliosis, unspecified: M41.9

## 2011-06-04 HISTORY — DX: Anxiety disorder, unspecified: F41.9

## 2011-06-04 LAB — CBC
HCT: 36 % (ref 36.0–46.0)
MCH: 27.5 pg (ref 26.0–34.0)
MCHC: 33.1 g/dL (ref 30.0–36.0)
MCV: 83.1 fL (ref 78.0–100.0)
RDW: 13.6 % (ref 11.5–15.5)

## 2011-06-04 LAB — URINALYSIS, ROUTINE W REFLEX MICROSCOPIC
Glucose, UA: 100 mg/dL — AB
Hgb urine dipstick: NEGATIVE
Ketones, ur: 15 mg/dL — AB
Nitrite: NEGATIVE
Protein, ur: 100 mg/dL — AB
Specific Gravity, Urine: 1.025 (ref 1.005–1.030)
Urobilinogen, UA: 4 mg/dL — ABNORMAL HIGH (ref 0.0–1.0)
pH: 6 (ref 5.0–8.0)

## 2011-06-04 LAB — URINE MICROSCOPIC-ADD ON

## 2011-06-04 LAB — POCT I-STAT, CHEM 8
BUN: 8 mg/dL (ref 6–23)
Calcium, Ion: 1.15 mmol/L (ref 1.12–1.32)
Chloride: 106 mEq/L (ref 96–112)
Creatinine, Ser: 0.5 mg/dL (ref 0.50–1.10)
Glucose, Bld: 111 mg/dL — ABNORMAL HIGH (ref 70–99)
HCT: 37 % (ref 36.0–46.0)
Hemoglobin: 12.6 g/dL (ref 12.0–15.0)
Potassium: 3.2 mEq/L — ABNORMAL LOW (ref 3.5–5.1)
Sodium: 143 meq/L (ref 135–145)
TCO2: 24 mmol/L (ref 0–100)

## 2011-06-04 LAB — DIFFERENTIAL
Basophils Absolute: 0 10*3/uL (ref 0.0–0.1)
Basophils Relative: 0 % (ref 0–1)
Eosinophils Absolute: 0.2 10*3/uL (ref 0.0–0.7)
Eosinophils Relative: 2 % (ref 0–5)
Monocytes Absolute: 0.8 10*3/uL (ref 0.1–1.0)
Neutro Abs: 7.2 10*3/uL (ref 1.7–7.7)

## 2011-06-04 LAB — POCT PREGNANCY, URINE: Preg Test, Ur: NEGATIVE

## 2011-06-04 MED ORDER — ONDANSETRON HCL 4 MG/2ML IJ SOLN
4.0000 mg | Freq: Once | INTRAMUSCULAR | Status: AC
  Administered 2011-06-04: 4 mg via INTRAVENOUS
  Filled 2011-06-04: qty 2

## 2011-06-04 MED ORDER — PROMETHAZINE HCL 25 MG PO TABS: 25.0000 mg | ORAL_TABLET | Freq: Four times a day (QID) | ORAL | Status: DC | PRN

## 2011-06-04 MED ORDER — SODIUM CHLORIDE 0.9 % IV BOLUS (SEPSIS)
1000.0000 mL | Freq: Once | INTRAVENOUS | Status: AC
  Administered 2011-06-04: 1000 mL via INTRAVENOUS

## 2011-06-04 NOTE — ED Provider Notes (Signed)
History     CSN: [id number]  Arrival date & time 06/04/11  2134   First MD Initiated Contact with Patient 06/04/11 2208      Chief Complaint  Patient presents with  . Emesis    (Consider location/radiation/quality/duration/timing/severity/associated sxs/prior treatment) HPI Comments: Patient here with about 4 day history of nausea, vomiting and diarrhea - she reports crampy epigastric pain and soreness with movement and vomiting - denies blood or bile in the vomit or blood in the diarrhea - she states that one of her co-workers left earlier in the week ill and has been out for the past 3 days with similar complaints.  Deneis fever, chills, lower abdominal pain, dysuria, hematuria, vaginal discharge - is currently menstruating at this time.  Patient is a 21 y.o. female presenting with vomiting. The history is provided by the patient. No language interpreter was used.  Emesis  This is a new problem. The current episode started more than 2 days ago. The problem occurs more than 10 times per day. The problem has not changed since onset.The emesis has an appearance of stomach contents. There has been no fever. Associated symptoms include abdominal pain. Pertinent negatives include no arthralgias, no chills, no cough, no diarrhea, no fever, no headaches, no myalgias, no sweats and no URI. Risk factors include ill contacts.    Past Medical History  Diagnosis Date  . Gastric reflux   . Arthritis   . Hyperthyroidism   . Anxiety   . Depression   . Scoliosis     History reviewed. No pertinent past surgical history.  History reviewed. No pertinent family history.  History  Substance Use Topics  . Smoking status: Never Smoker   . Smokeless tobacco: Never Used  . Alcohol Use: No    OB History    Grav Para Term Preterm Abortions TAB SAB Ect Mult Living                  Review of Systems  Constitutional: Negative for fever and chills.  Respiratory: Negative for cough.     Gastrointestinal: Positive for vomiting and abdominal pain. Negative for diarrhea.  Musculoskeletal: Negative for myalgias and arthralgias.  Neurological: Negative for headaches.  All other systems reviewed and are negative.    Allergies  Amoxicillin-pot clavulanate; Cephalexin; Latex; and Vicodin  Home Medications   Current Outpatient Rx  Name Route Sig Dispense Refill  . BETHANECHOL CHLORIDE 5 MG PO TABS Oral Take 5 mg by mouth 3 (three) times daily.      Marland Kitchen FLUOXETINE HCL 40 MG PO CAPS Oral Take 1 capsule (40 mg total) by mouth daily. 90 capsule 1  . LANSOPRAZOLE 30 MG PO CPDR Oral Take 30 mg by mouth 2 (two) times daily.      Marland Kitchen SYNTHROID 150 MCG PO TABS  TAKE ONE TABLET BY MOUTH DAILY 30 tablet 0    BP 123/83  Pulse 127  Temp(Src) 99.1 F (37.3 C) (Oral)  Resp 20  SpO2 98%  LMP 05/31/2011  Physical Exam  Nursing note and vitals reviewed. Constitutional: She is oriented to person, place, and time. She appears well-developed and well-nourished. No distress.  HENT:  Head: Normocephalic and atraumatic.  Right Ear: External ear normal.  Left Ear: External ear normal.  Nose: Nose normal.  Mouth/Throat: Oropharynx is clear and moist. No oropharyngeal exudate.  Eyes: Conjunctivae are normal. Pupils are equal, round, and reactive to light.  Neck: Normal range of motion. Neck supple.  Cardiovascular: Regular rhythm  and normal heart sounds.  Exam reveals no gallop and no friction rub.   No murmur heard.      tachycardia  Pulmonary/Chest: Effort normal and breath sounds normal. No respiratory distress. She has no wheezes. She has no rales. She exhibits no tenderness.  Abdominal: Soft. Bowel sounds are normal. She exhibits no distension and no mass. There is tenderness. There is no rebound and no guarding.    Musculoskeletal: Normal range of motion. She exhibits no edema and no tenderness.  Lymphadenopathy:    She has no cervical adenopathy.  Neurological: She is alert and  oriented to person, place, and time. No cranial nerve deficit.  Skin: Skin is warm and dry. No rash noted. No erythema. No pallor.  Psychiatric: She has a normal mood and affect. Her behavior is normal. Judgment and thought content normal.    ED Course  Procedures (including critical care time)  Labs Reviewed  URINALYSIS, ROUTINE W REFLEX MICROSCOPIC - Abnormal; Notable for the following:    Color, Urine RED (*) BIOCHEMICALS MAY BE AFFECTED BY COLOR   APPearance TURBID (*)    Glucose, UA 100 (*)    Bilirubin Urine MODERATE (*)    Ketones, ur 15 (*)    Protein, ur 100 (*)    Urobilinogen, UA 4.0 (*)    Leukocytes, UA TRACE (*)    All other components within normal limits  CBC - Abnormal; Notable for the following:    WBC 11.4 (*)    Hemoglobin 11.9 (*)    All other components within normal limits  URINE MICROSCOPIC-ADD ON - Abnormal; Notable for the following:    Squamous Epithelial / LPF FEW (*)    All other components within normal limits  POCT I-STAT, CHEM 8 - Abnormal; Notable for the following:    Potassium 3.2 (*)    Glucose, Bld 111 (*)    All other components within normal limits  DIFFERENTIAL   No results found.  Results for orders placed during the hospital encounter of 06/04/11  URINALYSIS, ROUTINE W REFLEX MICROSCOPIC      Component Value Range   Color, Urine RED (*) YELLOW    APPearance TURBID (*) CLEAR    Specific Gravity, Urine 1.025  1.005 - 1.030    pH 6.0  5.0 - 8.0    Glucose, UA 100 (*) NEGATIVE (mg/dL)   Hgb urine dipstick NEGATIVE  NEGATIVE    Bilirubin Urine MODERATE (*) NEGATIVE    Ketones, ur 15 (*) NEGATIVE (mg/dL)   Protein, ur 161 (*) NEGATIVE (mg/dL)   Urobilinogen, UA 4.0 (*) 0.0 - 1.0 (mg/dL)   Nitrite NEGATIVE  NEGATIVE    Leukocytes, UA TRACE (*) NEGATIVE   CBC      Component Value Range   WBC 11.4 (*) 4.0 - 10.5 (K/uL)   RBC 4.33  3.87 - 5.11 (MIL/uL)   Hemoglobin 11.9 (*) 12.0 - 15.0 (g/dL)   HCT 09.6  04.5 - 40.9 (%)   MCV 83.1   78.0 - 100.0 (fL)   MCH 27.5  26.0 - 34.0 (pg)   MCHC 33.1  30.0 - 36.0 (g/dL)   RDW 81.1  91.4 - 78.2 (%)   Platelets 373  150 - 400 (K/uL)  DIFFERENTIAL      Component Value Range   Neutrophils Relative 64  43 - 77 (%)   Neutro Abs 7.2  1.7 - 7.7 (K/uL)   Lymphocytes Relative 27  12 - 46 (%)   Lymphs Abs 3.1  0.7 - 4.0 (K/uL)   Monocytes Relative 7  3 - 12 (%)   Monocytes Absolute 0.8  0.1 - 1.0 (K/uL)   Eosinophils Relative 2  0 - 5 (%)   Eosinophils Absolute 0.2  0.0 - 0.7 (K/uL)   Basophils Relative 0  0 - 1 (%)   Basophils Absolute 0.0  0.0 - 0.1 (K/uL)  URINE MICROSCOPIC-ADD ON      Component Value Range   Squamous Epithelial / LPF FEW (*) RARE    WBC, UA 3-6  <3 (WBC/hpf)   RBC / HPF TOO NUMEROUS TO COUNT  <3 (RBC/hpf)   Bacteria, UA RARE  RARE   POCT I-STAT, CHEM 8      Component Value Range   Sodium 143  135 - 145 (mEq/L)   Potassium 3.2 (*) 3.5 - 5.1 (mEq/L)   Chloride 106  96 - 112 (mEq/L)   BUN 8  6 - 23 (mg/dL)   Creatinine, Ser 1.47  0.50 - 1.10 (mg/dL)   Glucose, Bld 829 (*) 70 - 99 (mg/dL)   Calcium, Ion 5.62  1.30 - 1.32 (mmol/L)   TCO2 24  0 - 100 (mmol/L)   Hemoglobin 12.6  12.0 - 15.0 (g/dL)   HCT 86.5  78.4 - 69.6 (%)  POCT PREGNANCY, URINE      Component Value Range   Preg Test, Ur NEGATIVE  NEGATIVE    No results found.   Gastroenteritis    MDM  Patient with soft and mildly tender abdomen, I do not suspect acute surgical emergency, here with symptoms c/w gastroenteritis - heart rate now returned to normal (81) - given a liter of fluids and zofran - able to tolerate po fluids - will discharge home with same.        Izola Price M[place], [place] 06/04/11 2357

## 2011-06-04 NOTE — Discharge Instructions (Signed)
Clear Liquid Diet The clear liquid dietconsists of foods that are liquid or will become liquid at room temperature.You should be able to see through the liquid and beverages. Examples of foods allowed on a clear liquid diet include fruit juice, broth or bouillon, gelatin, or frozen ice pops. The purpose of this diet is to provide necessary fluid, electrolytes such as sodium and potassium, and energy to keep the body functioning during times when you are not able to consume a regular diet.A clear liquid diet should not be continued for long periods of time as it is not nutritionally adequate.  REASONS FOR USING A CLEAR LIQUID DIET  In sudden onset (acute) conditions for a patient before or after surgery.   As the first step in oral feeding.   For fluid and electrolyte replacement in diarrheal diseases.   As a diet before certain medical tests are performed.  ADEQUACY The clear liquid diet is adequate only in ascorbic acid, according to the Recommended Dietary Allowances of the National Research Council. CHOOSING FOODS Breads and Starches  Allowed:  None are allowed.   Avoid: All are avoided.  Vegetables  Allowed:  Strained tomato or vegetable juice.   Avoid: Any others.  Fruit  Allowed:  Strained fruit juices and fruit drinks. Include 1 serving of citrus or vitamin C-enriched fruit juice daily.   Avoid: Any others.  Meat and Meat Substitutes  Allowed:  None are allowed.   Avoid: All are avoided.  Milk  Allowed:  None are allowed.   Avoid: All are avoided.  Soups and Combination Foods  Allowed:  Clear bouillon, broth, or strained broth-based soups.   Avoid: Any others.  Desserts and Sweets  Allowed:  Sugar, honey. High protein gelatin. Flavored gelatin, ices, or frozen ice pops that do not contain milk.   Avoid: Any others.  Fats and Oils  Allowed:  None are allowed.   Avoid: All are avoided.  Beverages  Allowed: Cereal beverages, coffee (regular or  decaffeinated), tea, or soda at the discretion of your caregiver.   Avoid: Any others.  Condiments  Allowed:  Iodized salt.   Avoid: Any others, including pepper.  Supplements  Allowed:  Liquid nutrition beverages.   Avoid: Any others that contain lactose or fiber.  SAMPLE MEAL PLAN Breakfast  4 oz (120 mL) strained orange juice.    to 1 cup (125 to 250 mL) gelatin (plain or fortified).   1 cup (250 mL) beverage (coffee or tea).   Sugar, if desired.  Midmorning Snack   cup (125 mL) gelatin (plain or fortified).  Lunch  1 cup (250 mL) broth or consomm.   4 oz (120 mL) strained grapefruit juice.    cup (125 mL) gelatin (plain or fortified).   1 cup (250 mL) beverage (coffee or tea).   Sugar, if desired.  Midafternoon Snack   cup (125 mL) fruit ice.    cup (125 mL) strained fruit juice.  Dinner  1 cup (250 mL) broth or consomm.    cup (125 mL) cranberry juice.    cup (125 mL) flavored gelatin (plain or fortified).   1 cup (250 mL) beverage (coffee or tea).   Sugar, if desired.  Evening Snack  4 oz (120 mL) strained apple juice (vitamin C-fortified).    cup (125 mL) flavored gelatin (plain or fortified).  Document Released: 01/05/2005 Document Revised: 12/25/2010 Document Reviewed: 04/04/2010 Exit[employer] Patient Information 2012 [employer].Viral Gastroenteritis Viral gastroenteritis is also known as stomach flu. This condition   affects the stomach and intestinal tract. It can cause sudden diarrhea and vomiting. The illness typically lasts 3 to 8 days. Most people develop an immune response that eventually gets rid of the virus. While this natural response develops, the virus can make you quite ill. CAUSES  Many different viruses can cause gastroenteritis, such as rotavirus or noroviruses. You can catch one of these viruses by consuming contaminated food or water. You may also catch a virus by sharing utensils or other personal items with an  infected person or by touching a contaminated surface. SYMPTOMS  The most common symptoms are diarrhea and vomiting. These problems can cause a severe loss of body fluids (dehydration) and a body salt (electrolyte) imbalance. Other symptoms may include:  Fever.   Headache.   Fatigue.   Abdominal pain.  DIAGNOSIS  Your caregiver can usually diagnose viral gastroenteritis based on your symptoms and a physical exam. A stool sample may also be taken to test for the presence of viruses or other infections. TREATMENT  This illness typically goes away on its own. Treatments are aimed at rehydration. The most serious cases of viral gastroenteritis involve vomiting so severely that you are not able to keep fluids down. In these cases, fluids must be given through an intravenous line (IV). HOME CARE INSTRUCTIONS   Drink enough fluids to keep your urine clear or pale yellow. Drink small amounts of fluids frequently and increase the amounts as tolerated.   Ask your caregiver for specific rehydration instructions.   Avoid:   Foods high in sugar.   Alcohol.   Carbonated drinks.   Tobacco.   Juice.   Caffeine drinks.   Extremely hot or cold fluids.   Fatty, greasy foods.   Too much intake of anything at one time.   Dairy products until 24 to 48 hours after diarrhea stops.   You may consume probiotics. Probiotics are active cultures of beneficial bacteria. They may lessen the amount and number of diarrheal stools in adults. Probiotics can be found in yogurt with active cultures and in supplements.   Wash your hands well to avoid spreading the virus.   Only take over-the-counter or prescription medicines for pain, discomfort, or fever as directed by your caregiver. Do not give aspirin to children. Antidiarrheal medicines are not recommended.   Ask your caregiver if you should continue to take your regular prescribed and over-the-counter medicines.   Keep all follow-up appointments  as directed by your caregiver.  SEEK IMMEDIATE MEDICAL CARE IF:   You are unable to keep fluids down.   You do not urinate at least once every 6 to 8 hours.   You develop shortness of breath.   You notice blood in your stool or vomit. This may look like coffee grounds.   You have abdominal pain that increases or is concentrated in one small area (localized).   You have persistent vomiting or diarrhea.   You have a fever.   The patient is a child younger than 3 months, and he or she has a fever.   The patient is a child older than 3 months, and he or she has a fever and persistent symptoms.   The patient is a child older than 3 months, and he or she has a fever and symptoms suddenly get worse.   The patient is a baby, and he or she has no tears when crying.  MAKE SURE YOU:   Understand these instructions.   Will watch your condition.     Will get help right away if you are not doing well or get worse.  Document Released: 01/05/2005 Document Revised: 12/25/2010 Document Reviewed: 10/22/2010 Exit[employer] Patient Information 2012 [employer]. 

## 2011-06-04 NOTE — ED Notes (Signed)
Pt c/o epigastric pain and vomiting for 1 week.  Aprox vomiting x 30 in 24 hours.  Not able to keep down any food/water

## 2011-06-04 NOTE — ED Notes (Signed)
Pt drinking ginger ale  

## 2011-06-05 NOTE — ED Notes (Signed)
PT ambulated with a  Steady gait; VSS; A&Ox3; no signs of distress; respirations even and unlabored; skin warm and dry. No questions; feeling better.

## 2011-06-06 NOTE — ED Provider Notes (Signed)
Medical screening examination/treatment/procedure(s) were performed by non-physician practitioner and as supervising physician I was immediately available for consultation/collaboration.   Glynn Yepes Y. Elika Godar, MD 06/06/11 0712 

## 2011-07-30 ENCOUNTER — Encounter: Payer: Self-pay | Admitting: "Endocrinology

## 2011-07-30 ENCOUNTER — Ambulatory Visit: Payer: Self-pay | Admitting: "Endocrinology

## 2011-11-16 ENCOUNTER — Other Ambulatory Visit (HOSPITAL_COMMUNITY): Payer: Self-pay | Admitting: *Deleted

## 2011-11-16 DIAGNOSIS — F411 Generalized anxiety disorder: Secondary | ICD-10-CM

## 2011-11-21 ENCOUNTER — Encounter (HOSPITAL_COMMUNITY): Payer: Self-pay | Admitting: *Deleted

## 2011-11-21 ENCOUNTER — Emergency Department (HOSPITAL_COMMUNITY)
Admission: EM | Admit: 2011-11-21 | Discharge: 2011-11-22 | Disposition: A | Discharge status: Home / Self Care | Payer: Self-pay | Attending: Emergency Medicine | Admitting: Emergency Medicine

## 2011-11-21 DIAGNOSIS — R509 Fever, unspecified: Secondary | ICD-10-CM | POA: Insufficient documentation

## 2011-11-21 DIAGNOSIS — M4120 Other idiopathic scoliosis, site unspecified: Secondary | ICD-10-CM | POA: Insufficient documentation

## 2011-11-21 DIAGNOSIS — J039 Acute tonsillitis, unspecified: Secondary | ICD-10-CM

## 2011-11-21 DIAGNOSIS — F341 Dysthymic disorder: Secondary | ICD-10-CM | POA: Insufficient documentation

## 2011-11-21 DIAGNOSIS — Z79899 Other long term (current) drug therapy: Secondary | ICD-10-CM | POA: Insufficient documentation

## 2011-11-21 DIAGNOSIS — M129 Arthropathy, unspecified: Secondary | ICD-10-CM | POA: Insufficient documentation

## 2011-11-21 DIAGNOSIS — E0590 Thyrotoxicosis, unspecified without thyrotoxic crisis or storm: Secondary | ICD-10-CM | POA: Insufficient documentation

## 2011-11-21 DIAGNOSIS — K219 Gastro-esophageal reflux disease without esophagitis: Secondary | ICD-10-CM | POA: Insufficient documentation

## 2011-11-21 DIAGNOSIS — R112 Nausea with vomiting, unspecified: Secondary | ICD-10-CM | POA: Insufficient documentation

## 2011-11-21 LAB — CBC WITH DIFFERENTIAL/PLATELET
Eosinophils Absolute: 0.1 10*3/uL (ref 0.0–0.7)
HCT: 38.5 % (ref 36.0–46.0)
Hemoglobin: 12.8 g/dL (ref 12.0–15.0)
Lymphs Abs: 1.8 10*3/uL (ref 0.7–4.0)
MCH: 27.3 pg (ref 26.0–34.0)
Monocytes Relative: 8 % (ref 3–12)
Neutro Abs: 10.4 10*3/uL — ABNORMAL HIGH (ref 1.7–7.7)
Neutrophils Relative %: 78 % — ABNORMAL HIGH (ref 43–77)
RBC: 4.69 MIL/uL (ref 3.87–5.11)

## 2011-11-21 MED ORDER — MORPHINE SULFATE 4 MG/ML IJ SOLN
4.0000 mg | Freq: Once | INTRAMUSCULAR | Status: AC
  Administered 2011-11-21: 4 mg via INTRAVENOUS
  Filled 2011-11-21: qty 1

## 2011-11-21 MED ORDER — METHYLPREDNISOLONE SODIUM SUCC 125 MG IJ SOLR
125.0000 mg | Freq: Once | INTRAMUSCULAR | Status: AC
  Administered 2011-11-21: 125 mg via INTRAVENOUS
  Filled 2011-11-21: qty 2

## 2011-11-21 MED ORDER — SODIUM CHLORIDE 0.9 % IV SOLN
Freq: Once | INTRAVENOUS | Status: AC
  Administered 2011-11-21: via INTRAVENOUS

## 2011-11-21 MED ORDER — ONDANSETRON HCL 4 MG/2ML IJ SOLN
4.0000 mg | Freq: Once | INTRAMUSCULAR | Status: AC
  Administered 2011-11-21: 4 mg via INTRAVENOUS
  Filled 2011-11-21: qty 2

## 2011-11-21 MED ORDER — SODIUM CHLORIDE 0.9 % IV BOLUS (SEPSIS)
1000.0000 mL | Freq: Once | INTRAVENOUS | Status: AC
  Administered 2011-11-21: 1000 mL via INTRAVENOUS

## 2011-11-21 NOTE — ED Notes (Signed)
The pt has had a sorethroat temp .  Nausea diarrhea no vomting.  She has had these symptoms for 2-3 days.   She cannot eat

## 2011-11-21 NOTE — ED Notes (Signed)
Last tylenol was 2 hours ago

## 2011-11-21 NOTE — ED Notes (Signed)
Pt c/o sore throat, nausea, diarrhea, denies vomiting. Pt also c/o fevers up to 102 F. Pt unable to eat due to sore throat. Tonsils enlarged with white patches present.

## 2011-11-21 NOTE — ED Provider Notes (Signed)
History     CSN: [id number]  Arrival date & time 11/21/11  2059   First MD Initiated Contact with Patient 11/21/11 2159      Chief Complaint  Patient presents with  . multiple symptoms     (Consider location/radiation/quality/duration/timing/severity/associated sxs/prior treatment) The history is provided by the patient.   21 year old female has had a severe sore throat for the last 2 days. This is been associated with fevers eyes 102, chills, sweats. There's been associated nausea, vomiting, diarrhea. She's not been able to eat or drink because of her sore throat. Throat pain is worse when she tries to swallow. She denies arthralgias or myalgias. There was a sick contact about 5 days ago, but she's not sure what specific trauma that person had. She's tried taking acetaminophen and ibuprofen without any significant relief.  Past Medical History  Diagnosis Date  . Gastric reflux   . Arthritis   . Hyperthyroidism   . Anxiety   . Depression   . Scoliosis     History reviewed. No pertinent past surgical history.  No family history on file.  History  Substance Use Topics  . Smoking status: Never Smoker   . Smokeless tobacco: Never Used  . Alcohol Use: No    OB History    Grav Para Term Preterm Abortions TAB SAB Ect Mult Living                  Review of Systems  All other systems reviewed and are negative.    Allergies  Amoxicillin-pot clavulanate; Cephalexin; Latex; and Vicodin  Home Medications   Current Outpatient Rx  Name Route Sig Dispense Refill  . BETHANECHOL CHLORIDE 5 MG PO TABS Oral Take 5 mg by mouth 3 (three) times daily.     Marland Kitchen FLUOXETINE HCL 40 MG PO CAPS Oral Take 1 capsule (40 mg total) by mouth daily. 90 capsule 1  . LANSOPRAZOLE 30 MG PO CPDR Oral Take 30 mg by mouth 2 (two) times daily.      Marland Kitchen SYNTHROID 150 MCG PO TABS  TAKE ONE TABLET BY MOUTH DAILY 30 tablet 0  . PROMETHAZINE HCL 25 MG PO TABS Oral Take 1 tablet (25 mg total) by mouth every  6 (six) hours as needed for nausea. 30 tablet 0    BP 141/87  Temp 99.4 F (37.4 C) (Oral)  Resp 20  SpO2 99%  Physical Exam  Nursing note and vitals reviewed. 21 year old female, resting comfortably and in no acute distress. Vital signs are significant for borderline hypertension with blood pressure 141/87. Oxygen saturation is 99%, which is normal. Head is normocephalic and atraumatic. PERRLA, EOMI. tonsils are markedly erythematous and moderately hypertrophic with marked exudate bilaterally. Neck is nontender and supple without adenopathy or JVD. Back is nontender and there is no CVA tenderness. Lungs are clear without rales, wheezes, or rhonchi. Chest is nontender. Heart has regular rate and rhythm without murmur. Abdomen is soft, flat, nontender without masses or hepatosplenomegaly and peristalsis is hypoactive. Extremities have no cyanosis or edema, full range of motion is present. Skin is warm and dry without rash. Neurologic: Mental status is normal, cranial nerves are intact, there are no motor or sensory deficits.   ED Course  Procedures (including critical care time)  Results for orders placed during the hospital encounter of 11/21/11  RAPID STREP SCREEN      Component Value Range   Streptococcus, Group A Screen (Direct) NEGATIVE  NEGATIVE  CBC WITH DIFFERENTIAL  Component Value Range   WBC 13.4 (*) 4.0 - 10.5 K/uL   RBC 4.69  3.87 - 5.11 MIL/uL   Hemoglobin 12.8  12.0 - 15.0 g/dL   HCT 16.1  09.6 - 04.5 %   MCV 82.1  78.0 - 100.0 fL   MCH 27.3  26.0 - 34.0 pg   MCHC 33.2  30.0 - 36.0 g/dL   RDW 40.9  81.1 - 91.4 %   Platelets 313  150 - 400 K/uL   Neutrophils Relative 78 (*) 43 - 77 %   Neutro Abs 10.4 (*) 1.7 - 7.7 K/uL   Lymphocytes Relative 14  12 - 46 %   Lymphs Abs 1.8  0.7 - 4.0 K/uL   Monocytes Relative 8  3 - 12 %   Monocytes Absolute 1.1 (*) 0.1 - 1.0 K/uL   Eosinophils Relative 0  0 - 5 %   Eosinophils Absolute 0.1  0.0 - 0.7 K/uL    Basophils Relative 0  0 - 1 %   Basophils Absolute 0.0  0.0 - 0.1 K/uL  COMPREHENSIVE METABOLIC PANEL      Component Value Range   Sodium 136  135 - 145 mEq/L   Potassium 5.3 (*) 3.5 - 5.1 mEq/L   Chloride 102  96 - 112 mEq/L   CO2 23  19 - 32 mEq/L   Glucose, Bld 94  70 - 99 mg/dL   BUN 7  6 - 23 mg/dL   Creatinine, Ser 7.82 (*) 0.50 - 1.10 mg/dL   Calcium 9.3  8.4 - 95.6 mg/dL   Total Protein 7.9  6.0 - 8.3 g/dL   Albumin 3.7  3.5 - 5.2 g/dL   AST 36  0 - 37 U/L   ALT 15  0 - 35 U/L   Alkaline Phosphatase 95  39 - 117 U/L   Total Bilirubin 0.7  0.3 - 1.2 mg/dL   GFR calc non Af Amer >90  >90 mL/min   GFR calc Af Amer >90  >90 mL/min  MONONUCLEOSIS SCREEN      Component Value Range   Mono Screen NEGATIVE  NEGATIVE    1. Tonsillitis       MDM  Acute tonsillitis. Strep screen is negative. Mono screen and CBC will be checked and she'll be given IV fluids and steroids as well as ondansetron and morphine for pain and nausea. Throat sample will be sent for a strep DNA.  Laboratory workup is unremarkable including negative mono screen. She feels better after IV hydration and IV methylprednisolone. She is also given a dose of IV dexamethasone and sent home with prescription for Percocet for pain.     Dione Booze, MD 11/22/11 [phone]

## 2011-11-22 LAB — COMPREHENSIVE METABOLIC PANEL
Alkaline Phosphatase: 95 U/L (ref 39–117)
BUN: 7 mg/dL (ref 6–23)
Chloride: 102 mEq/L (ref 96–112)
GFR calc Af Amer: >90 mL/min (ref >90)
Glucose, Bld: 94 mg/dL (ref 70–99)
Potassium: 5.3 mEq/L — ABNORMAL HIGH (ref 3.5–5.1)
Total Bilirubin: 0.7 mg/dL (ref 0.3–1.2)

## 2011-11-22 LAB — MONONUCLEOSIS SCREEN: Mono Screen: NEGATIVE

## 2011-11-22 MED ORDER — DEXAMETHASONE SODIUM PHOSPHATE 10 MG/ML IJ SOLN
INTRAMUSCULAR | Status: AC
  Filled 2011-11-22: qty 1

## 2011-11-23 LAB — STREP A DNA PROBE: Group A Strep Probe: NEGATIVE

## 2011-12-26 ENCOUNTER — Other Ambulatory Visit: Payer: Self-pay | Admitting: "Endocrinology

## 2012-02-05 ENCOUNTER — Other Ambulatory Visit: Payer: Self-pay | Admitting: Pediatrics

## 2012-02-05 DIAGNOSIS — K219 Gastro-esophageal reflux disease without esophagitis: Secondary | ICD-10-CM

## 2012-02-05 MED ORDER — BETHANECHOL CHLORIDE 5 MG PO TABS: 5.0000 mg | ORAL_TABLET | Freq: Three times a day (TID) | ORAL | Status: DC

## 2012-02-25 ENCOUNTER — Emergency Department (HOSPITAL_COMMUNITY)
Admission: EM | Admit: 2012-02-25 | Discharge: 2012-02-25 | Disposition: A | Discharge status: Home / Self Care | Payer: No Typology Code available for payment source | Source: Home / Self Care | Attending: Family Medicine | Admitting: Family Medicine

## 2012-02-25 ENCOUNTER — Encounter (HOSPITAL_COMMUNITY): Payer: Self-pay

## 2012-02-25 DIAGNOSIS — E038 Other specified hypothyroidism: Secondary | ICD-10-CM

## 2012-02-25 DIAGNOSIS — K219 Gastro-esophageal reflux disease without esophagitis: Secondary | ICD-10-CM

## 2012-02-25 DIAGNOSIS — E063 Autoimmune thyroiditis: Secondary | ICD-10-CM

## 2012-02-25 DIAGNOSIS — Z23 Encounter for immunization: Secondary | ICD-10-CM

## 2012-02-25 DIAGNOSIS — K3189 Other diseases of stomach and duodenum: Secondary | ICD-10-CM

## 2012-02-25 DIAGNOSIS — R1013 Epigastric pain: Secondary | ICD-10-CM

## 2012-02-25 DIAGNOSIS — F411 Generalized anxiety disorder: Secondary | ICD-10-CM

## 2012-02-25 DIAGNOSIS — L83 Acanthosis nigricans: Secondary | ICD-10-CM

## 2012-02-25 LAB — COMPREHENSIVE METABOLIC PANEL
BUN: 10 mg/dL (ref 6–23)
Calcium: 9.5 mg/dL (ref 8.4–10.5)
GFR calc Af Amer: >90 mL/min (ref >90)
Glucose, Bld: 90 mg/dL (ref 70–99)
Total Protein: 7.5 g/dL (ref 6.0–8.3)

## 2012-02-25 MED ORDER — INFLUENZA VIRUS VACC SPLIT PF IM SUSP
0.5000 mL | Freq: Once | INTRAMUSCULAR | Status: AC
  Administered 2012-02-25: 0.5 mL via INTRAMUSCULAR

## 2012-02-25 MED ORDER — ALBUTEROL SULFATE (5 MG/ML) 0.5% IN NEBU
INHALATION_SOLUTION | RESPIRATORY_TRACT | Status: AC
  Filled 2012-02-25: qty 0.5

## 2012-02-25 MED ORDER — BETHANECHOL CHLORIDE 5 MG PO TABS: 5.0000 mg | ORAL_TABLET | Freq: Three times a day (TID) | ORAL | Status: DC

## 2012-02-25 MED ORDER — FLUTICASONE PROPIONATE 50 MCG/ACT NA SUSP: 2.0000 | Freq: Every day | NASAL | Status: DC

## 2012-02-25 MED ORDER — ALBUTEROL SULFATE (5 MG/ML) 0.5% IN NEBU
2.5000 mg | INHALATION_SOLUTION | Freq: Once | RESPIRATORY_TRACT | Status: AC
Start: 2012-02-25 — End: 2012-02-25
  Administered 2012-02-25: 2.5 mg via RESPIRATORY_TRACT

## 2012-02-25 MED ORDER — IPRATROPIUM BROMIDE 0.02 % IN SOLN
0.5000 mg | Freq: Once | RESPIRATORY_TRACT | Status: AC
Start: 2012-02-25 — End: 2012-02-25
  Administered 2012-02-25: 0.5 mg via RESPIRATORY_TRACT

## 2012-02-25 MED ORDER — SYNTHROID 150 MCG PO TABS: 150.0000 ug | ORAL_TABLET | Freq: Every day | ORAL | Status: DC

## 2012-02-25 MED ORDER — FLUOXETINE HCL 40 MG PO CAPS: 40.0000 mg | ORAL_CAPSULE | Freq: Every day | ORAL | Status: DC

## 2012-02-25 MED ORDER — ALBUTEROL SULFATE HFA 108 (90 BASE) MCG/ACT IN AERS: 2.0000 | INHALATION_SPRAY | Freq: Four times a day (QID) | RESPIRATORY_TRACT | Status: DC | PRN

## 2012-02-25 MED ORDER — LORATADINE 10 MG PO CAPS: 1.0000 | ORAL_CAPSULE | Freq: Every day | ORAL | Status: DC

## 2012-02-25 MED ORDER — LANSOPRAZOLE 30 MG PO CPDR: 30.0000 mg | DELAYED_RELEASE_CAPSULE | Freq: Two times a day (BID) | ORAL | Status: DC

## 2012-02-25 NOTE — ED Notes (Signed)
Patient states has history of depression, acid reflux,ADD, hyper thyroid

## 2012-02-25 NOTE — ED Provider Notes (Signed)
History     CSN: [id number]  Arrival date & time 02/25/12  1719   First MD Initiated Contact with Patient 02/25/12 1845     Chief Complaint  Patient presents with  . Medication Refill   HPI Pt presenting for refills of her regular medications. She also is requesting a referral to behavioral health.  Pt says that she has been coughing and wheezing and having nasal congestion.  Pt denies fever and chills.  She has cough at night.  She says that she has dry home ([place] heat) and has had allergies and has a history of asthma.   Past Medical History  Diagnosis Date  . Gastric reflux   . Arthritis   . Hyperthyroidism   . Anxiety   . Depression   . Scoliosis     History reviewed. No pertinent past surgical history.  No family history on file.  History  Substance Use Topics  . Smoking status: Never Smoker   . Smokeless tobacco: Never Used  . Alcohol Use: No    OB History    Grav Para Term Preterm Abortions TAB SAB Ect Mult Living                 Review of Systems  HENT: Positive for congestion, rhinorrhea, sneezing and postnasal drip.   Respiratory: Positive for wheezing.   All other systems reviewed and are negative.    Allergies  Amoxicillin-pot clavulanate; Cephalexin; Latex; and Vicodin  Home Medications   Current Outpatient Rx  Name  Route  Sig  Dispense  Refill  . ALBUTEROL SULFATE HFA 108 (90 BASE) MCG/ACT IN AERS   Inhalation   Inhale 2 puffs into the lungs every 6 (six) hours as needed for wheezing.   1 Inhaler   2   . BETHANECHOL CHLORIDE 5 MG PO TABS   Oral   Take 1 tablet (5 mg total) by mouth 3 (three) times daily.   100 tablet   5   . FLUOXETINE HCL 40 MG PO CAPS   Oral   Take 1 capsule (40 mg total) by mouth daily.   30 capsule   3   . FLUTICASONE PROPIONATE 50 MCG/ACT NA SUSP   Nasal   Place 2 sprays into the nose daily.   16 g   2   . LANSOPRAZOLE 30 MG PO CPDR   Oral   Take 1 capsule (30 mg total) by mouth 2 (two) times daily.  60 capsule   3   . LORATADINE 10 MG PO CAPS   Oral   Take 1 capsule (10 mg total) by mouth daily.   30 each   3   . PROMETHAZINE HCL 25 MG PO TABS   Oral   Take 1 tablet (25 mg total) by mouth every 6 (six) hours as needed for nausea.   30 tablet   0   . SYNTHROID 150 MCG PO TABS   Oral   Take 1 tablet (150 mcg total) by mouth daily.   30 tablet   0     Dispense as written.     BP 106/70  Pulse 78  Temp 98.1 F (36.7 C) (Oral)  Resp 16  SpO2 99%  LMP 02/10/2012  Physical Exam  Nursing note and vitals reviewed. Constitutional: She is oriented to person, place, and time. She appears well-developed and well-nourished. No distress.  HENT:  Head: Normocephalic and atraumatic.  Nose: Mucosal edema and rhinorrhea present. Right sinus exhibits no maxillary sinus  tenderness and no frontal sinus tenderness. Left sinus exhibits no maxillary sinus tenderness and no frontal sinus tenderness.  Eyes: Conjunctivae normal and EOM are normal. Pupils are equal, round, and reactive to light.  Neck: Normal range of motion. Neck supple. No JVD present.  Cardiovascular: Normal rate, regular rhythm and normal heart sounds.   Pulmonary/Chest: Effort normal. She has wheezes. She has no rales. She exhibits no tenderness.  Abdominal: Soft. Bowel sounds are normal.  Lymphadenopathy:    She has no cervical adenopathy.  Neurological: She is alert and oriented to person, place, and time.  Skin: Skin is warm and dry. No erythema.  Psychiatric: She has a normal mood and affect. Her behavior is normal. Judgment and thought content normal.    ED Course  Procedures (including critical care time)   Labs Reviewed  TSH  T4, FREE  COMPREHENSIVE METABOLIC PANEL  HEMOGLOBIN A1C   No results found.  1. Hypothyroidism, acquired, autoimmune   2. Acanthosis nigricans, acquired   3. GERD (gastroesophageal reflux disease)   4. Dyspepsia   5. GAD (generalized anxiety disorder)   6.  ALLERGIC  RHINITIS 7.  History of prediabetes   MDM  RECOMMENDATIONS / PLAN Albuterol / atrovent nebulizer treatment Check lab tests today FLU VACCINE GIVEN TODAY Loratadine 10 mg po daily Refilled meds for thyroid and depression Referral to Behavioral Health for ongoing follow up   FOLLOW UP 3 months   The patient was given clear instructions to go to ER or return to medical center if symptoms don't improve, worsen or new problems develop.  The patient verbalized understanding.  The patient was told to call to get lab results if they haven't heard anything in the next week.            Cleora Fleet, MD 02/26/12 1010

## 2012-02-26 LAB — HEMOGLOBIN A1C: Hgb A1c MFr Bld: 5.2 % (ref <5.7)

## 2012-02-26 NOTE — ED Notes (Signed)
Referral faxed to behavioral health  Message left to call [facility]

## 2012-02-29 NOTE — Progress Notes (Signed)
Quick Note:  Please notify patient that her TSH was slightly elevated at 6.76. Continue current dose of synthroid. Recheck TSH in 3 months. Remember to take Synthroid everyday. Other labs OK.   Rodney Langton, MD, CDE, FAAFP [facility] [place], [place]   ______

## 2012-05-26 ENCOUNTER — Emergency Department (INDEPENDENT_AMBULATORY_CARE_PROVIDER_SITE_OTHER)
Admission: EM | Admit: 2012-05-26 | Discharge: 2012-05-26 | Disposition: A | Discharge status: Home / Self Care | Payer: No Typology Code available for payment source | Source: Home / Self Care

## 2012-05-26 ENCOUNTER — Encounter (HOSPITAL_COMMUNITY): Payer: Self-pay

## 2012-05-26 DIAGNOSIS — R21 Rash and other nonspecific skin eruption: Secondary | ICD-10-CM

## 2012-05-26 DIAGNOSIS — E063 Autoimmune thyroiditis: Secondary | ICD-10-CM

## 2012-05-26 DIAGNOSIS — E038 Other specified hypothyroidism: Secondary | ICD-10-CM

## 2012-05-26 DIAGNOSIS — K219 Gastro-esophageal reflux disease without esophagitis: Secondary | ICD-10-CM

## 2012-05-26 LAB — TSH: TSH: 1.541 u[IU]/mL (ref 0.350–4.500)

## 2012-05-26 MED ORDER — HYDROCORTISONE 0.5 % EX CREA: TOPICAL_CREAM | Freq: Two times a day (BID) | CUTANEOUS | Status: DC

## 2012-05-26 MED ORDER — PANTOPRAZOLE SODIUM 40 MG PO TBEC: 40.0000 mg | DELAYED_RELEASE_TABLET | Freq: Every day | ORAL | Status: DC

## 2012-05-26 NOTE — ED Provider Notes (Addendum)
History     CSN: [id number]  Arrival date & time 05/26/12  1501   None     Chief Complaint  Patient presents with  . Follow-up   22 year old female presents for a followup. Patient has multiple complaints. She feels that she gets full easily and does not have an appetite. She has not lost any weight. She occasionally feels nauseous. She feels that she's not pregnant because she is getting her menstruation right now.. He denies any blood in the stool black or a stone. She complains of reflux. She is supposed to take Prevacid but has not been taking it because it is expensive. She is taking her Synthroid. Is here for a TSH evaluation. She is also noticed a rash on her face which is worse when she goes into the sun. She does not apply any sunscreen lotion and completely avoids sun exposure. She thinks that she might have lupus.   HPI  Past Medical History  Diagnosis Date  . Gastric reflux   . Arthritis   . Hyperthyroidism   . Anxiety   . Depression   . Scoliosis     History reviewed. No pertinent past surgical history.  No family history on file.  History  Substance Use Topics  . Smoking status: Never Smoker   . Smokeless tobacco: Never Used  . Alcohol Use: No    OB History   Grav Para Term Preterm Abortions TAB SAB Ect Mult Living                  Review of Systems As in history of present illness All other systems reviewed and are negative.  Allergies  Amoxicillin-pot clavulanate; Cephalexin; Latex; and Vicodin  Home Medications   Current Outpatient Rx  Name  Route  Sig  Dispense  Refill  . albuterol (PROVENTIL HFA;VENTOLIN HFA) 108 (90 BASE) MCG/ACT inhaler   Inhalation   Inhale 2 puffs into the lungs every 6 (six) hours as needed for wheezing.   1 Inhaler   2   . bethanechol (URECHOLINE) 5 MG tablet   Oral   Take 1 tablet (5 mg total) by mouth 3 (three) times daily.   100 tablet   5   . FLUoxetine (PROZAC) 40 MG capsule   Oral   Take 1 capsule (40  mg total) by mouth daily.   30 capsule   3   . fluticasone (FLONASE) 50 MCG/ACT nasal spray   Nasal   Place 2 sprays into the nose daily.   16 g   2   . hydrocortisone cream 0.5 %   Topical   Apply topically 2 (two) times daily.   30 g   1   . Loratadine 10 MG CAPS   Oral   Take 1 capsule (10 mg total) by mouth daily.   30 each   3   . pantoprazole (PROTONIX) 40 MG tablet   Oral   Take 1 tablet (40 mg total) by mouth daily.   30 tablet   2   . EXPIRED: promethazine (PHENERGAN) 25 MG tablet   Oral   Take 1 tablet (25 mg total) by mouth every 6 (six) hours as needed for nausea.   30 tablet   0   . SYNTHROID 150 MCG tablet   Oral   Take 1 tablet (150 mcg total) by mouth daily.   30 tablet   0     Dispense as written.     BP 104/65  Pulse  100  Temp(Src) 99.4 F (37.4 C) (Oral)  Resp 16  SpO2 96%  Physical Exam Head: Normocephalic and atraumatic. Diffuse maculopapular rash on her face, Perioral area and in the nasolabial folds Nose: Mucosal edema and rhinorrhea present. Right sinus exhibits no maxillary sinus tenderness and no frontal sinus tenderness. Left sinus exhibits no maxillary sinus tenderness and no frontal sinus tenderness.  Eyes: Conjunctivae normal and EOM are normal. Pupils are equal, round, and reactive to light.  Neck: Normal range of motion. Neck supple. No JVD present.  Cardiovascular: Normal rate, regular rhythm and normal heart sounds.  Pulmonary/Chest: Effort normal. She has wheezes. She has no rales. She exhibits no tenderness.  Abdominal: Soft. Bowel sounds are normal.  Lymphadenopathy:  She has no cervical adenopathy.  Neurological: She is alert and oriented to person, place, and time.  Skin: Skin is warm and dry. No erythema.  Psychiatric: She has a normal mood and affect. Her behavior is normal. Judgment and thought content normal.     ED Course  Procedures (including critical care time)  Labs Reviewed  TSH  ANA   No  results found.   1. Hypothyroidism, acquired, autoimmune check a TSH, ANA,   2. Rash of face apply a mild hydrocortisone cream on the face, recommended to apply sunscreen when the patient has any sun exposure   3. GERD (gastroesophageal reflux disease) given a prescription for Protonix If unable to afford patient advised to take Prilosec       MDM  -        Richarda Overlie, MD 05/26/12 1534  Richarda Overlie, MD 05/26/12 1534  Richarda Overlie, MD 05/26/12 1553

## 2012-05-26 NOTE — ED Notes (Signed)
Follow up- thyroid problem 

## 2012-05-27 LAB — ANA: Anti Nuclear Antibody(ANA): NEGATIVE

## 2012-06-07 ENCOUNTER — Telehealth: Payer: Self-pay

## 2012-06-07 NOTE — Telephone Encounter (Signed)
Pt wanting to know lab results.    Arna Medici: Also wanting a follow up about a referral.

## 2012-06-08 NOTE — Telephone Encounter (Signed)
Patient called wanting her recent lab results Please view

## 2012-06-08 NOTE — Telephone Encounter (Signed)
Please let pt know her tsh was wnl and her ana was neg. This is good news on both counts.   Regarding her referral, was it to psychiatry or other? I see that she was supposed to schedule an appt with psychiatry but I do not see an appt scheduled. If this is what she is referring to, let me know and I can place referral.   Also, she was supposed to f/u here in 1 month but I do not see this scheduled either. Please get her scheduled for 1 mo f/u.   Thanks AW

## 2012-06-15 ENCOUNTER — Telehealth: Payer: Self-pay | Admitting: *Deleted

## 2012-06-15 ENCOUNTER — Other Ambulatory Visit: Payer: Self-pay | Admitting: Family Medicine

## 2012-06-15 DIAGNOSIS — F99 Mental disorder, not otherwise specified: Secondary | ICD-10-CM

## 2012-06-15 NOTE — Telephone Encounter (Signed)
06/15/12 Patient made aware of lab results TSH was WNL and ANA was negative per Dr. Lucretia Roers.  Also patient stated she was suppose to have psychiatrist  Referral spoke with Arna Medici regarding this referral. Arna Medici stated to have Dr. Lucretia Roers place an order and have patient to call [facility] at [phone]  And schedule an appointment after order have been placed . Patient informed to call and schedule and appt.also patient  Is scheduled for an appointment here at the clinic on June 22, 2012 P.Luiz Ochoa

## 2012-06-15 NOTE — Telephone Encounter (Signed)
Referral placed.

## 2012-06-22 ENCOUNTER — Ambulatory Visit: Payer: Self-pay

## 2012-09-06 ENCOUNTER — Emergency Department (HOSPITAL_COMMUNITY)
Admission: EM | Admit: 2012-09-06 | Discharge: 2012-09-06 | Disposition: A | Discharge status: Home / Self Care | Payer: No Typology Code available for payment source | Attending: Emergency Medicine | Admitting: Emergency Medicine

## 2012-09-06 ENCOUNTER — Encounter (HOSPITAL_COMMUNITY): Payer: Self-pay | Admitting: Emergency Medicine

## 2012-09-06 DIAGNOSIS — Z3202 Encounter for pregnancy test, result negative: Secondary | ICD-10-CM | POA: Insufficient documentation

## 2012-09-06 DIAGNOSIS — Z8659 Personal history of other mental and behavioral disorders: Secondary | ICD-10-CM | POA: Insufficient documentation

## 2012-09-06 DIAGNOSIS — Z91040 Latex allergy status: Secondary | ICD-10-CM | POA: Insufficient documentation

## 2012-09-06 DIAGNOSIS — Z8739 Personal history of other diseases of the musculoskeletal system and connective tissue: Secondary | ICD-10-CM | POA: Insufficient documentation

## 2012-09-06 DIAGNOSIS — Z79899 Other long term (current) drug therapy: Secondary | ICD-10-CM | POA: Insufficient documentation

## 2012-09-06 DIAGNOSIS — E0590 Thyrotoxicosis, unspecified without thyrotoxic crisis or storm: Secondary | ICD-10-CM | POA: Insufficient documentation

## 2012-09-06 DIAGNOSIS — Z791 Long term (current) use of non-steroidal anti-inflammatories (NSAID): Secondary | ICD-10-CM | POA: Insufficient documentation

## 2012-09-06 DIAGNOSIS — J069 Acute upper respiratory infection, unspecified: Secondary | ICD-10-CM

## 2012-09-06 DIAGNOSIS — R509 Fever, unspecified: Secondary | ICD-10-CM | POA: Insufficient documentation

## 2012-09-06 DIAGNOSIS — J029 Acute pharyngitis, unspecified: Secondary | ICD-10-CM | POA: Insufficient documentation

## 2012-09-06 DIAGNOSIS — K219 Gastro-esophageal reflux disease without esophagitis: Secondary | ICD-10-CM | POA: Insufficient documentation

## 2012-09-06 LAB — URINALYSIS, ROUTINE W REFLEX MICROSCOPIC
Bilirubin Urine: NEGATIVE
Ketones, ur: NEGATIVE mg/dL
Nitrite: NEGATIVE
pH: 8 (ref 5.0–8.0)

## 2012-09-06 MED ORDER — ACETAMINOPHEN 500 MG PO TABS
1000.0000 mg | ORAL_TABLET | Freq: Once | ORAL | Status: AC
  Administered 2012-09-06: 1000 mg via ORAL
  Filled 2012-09-06: qty 2

## 2012-09-06 MED ORDER — SODIUM CHLORIDE 0.9 % IV BOLUS (SEPSIS)
1000.0000 mL | Freq: Once | INTRAVENOUS | Status: AC
  Administered 2012-09-06: 1000 mL via INTRAVENOUS

## 2012-09-06 NOTE — ED Notes (Signed)
Pt up to b/r, steady gait.  ?

## 2012-09-06 NOTE — ED Notes (Signed)
Pt alert, NAD, calm, interactive, cooperative with exam, family at [facility].

## 2012-09-06 NOTE — ED Provider Notes (Signed)
CSN: [id number]     Arrival date & time 09/06/12  0414 History     First MD Initiated Contact with Patient 09/06/12 0421     Chief Complaint  Patient presents with  . Emesis   (Consider location/radiation/quality/duration/timing/severity/associated sxs/prior Treatment) Patient is a 22 y.o. female presenting with general illness.  Illness Severity:  Moderate Onset quality:  Gradual Duration:  24 hours Timing:  Constant Progression:  Worsening Context:  Sore throat started 2 days ago, fevers started yesterday, vomiting started 1.5 hours ago.   Relieved by:  Ibuprofen Worsened by:  Nothing Associated symptoms: fever, sore throat and vomiting   Associated symptoms: no abdominal pain, no chest pain, no cough, no diarrhea and no shortness of breath     Past Medical History  Diagnosis Date  . Gastric reflux   . Hyperthyroidism   . Anxiety   . Depression   . Scoliosis    History reviewed. No pertinent past surgical history. No family history on file. History  Substance Use Topics  . Smoking status: Never Smoker   . Smokeless tobacco: Never Used  . Alcohol Use: No   OB History   Grav Para Term Preterm Abortions TAB SAB Ect Mult Living                 Review of Systems  Constitutional: Positive for fever.  HENT: Positive for sore throat.   Respiratory: Negative for cough and shortness of breath.   Cardiovascular: Negative for chest pain.  Gastrointestinal: Positive for vomiting. Negative for abdominal pain and diarrhea.  All other systems reviewed and are negative.    Allergies  Amoxicillin-pot clavulanate; Cephalexin; Latex; and Vicodin  Home Medications   Current Outpatient Rx  Name  Route  Sig  Dispense  Refill  . albuterol (PROVENTIL HFA;VENTOLIN HFA) 108 (90 BASE) MCG/ACT inhaler   Inhalation   Inhale 2 puffs into the lungs every 6 (six) hours as needed for wheezing.   1 Inhaler   2   . bethanechol (URECHOLINE) 5 MG tablet   Oral   Take 1 tablet (5 mg  total) by mouth 3 (three) times daily.   100 tablet   5   . FLUoxetine (PROZAC) 40 MG capsule   Oral   Take 1 capsule (40 mg total) by mouth daily.   30 capsule   3   . fluticasone (FLONASE) 50 MCG/ACT nasal spray   Nasal   Place 2 sprays into the nose daily.   16 g   2   . hydrocortisone cream 0.5 %   Topical   Apply topically 2 (two) times daily.   30 g   1   . Loratadine 10 MG CAPS   Oral   Take 1 capsule (10 mg total) by mouth daily.   30 each   3   . pantoprazole (PROTONIX) 40 MG tablet   Oral   Take 1 tablet (40 mg total) by mouth daily.   30 tablet   2   . EXPIRED: promethazine (PHENERGAN) 25 MG tablet   Oral   Take 1 tablet (25 mg total) by mouth every 6 (six) hours as needed for nausea.   30 tablet   0   . SYNTHROID 150 MCG tablet   Oral   Take 1 tablet (150 mcg total) by mouth daily.   30 tablet   0     Dispense as written.    BP 131/84  Pulse 146  Temp(Src) 101.2 F (38.4 C) (  Oral)  Resp 22  Ht 5\' 4"  (1.626 m)  SpO2 99%  LMP 08/14/2012 Physical Exam  Nursing note and vitals reviewed. Constitutional: She is oriented to person, place, and time. She appears well-developed and well-nourished. No distress.  HENT:  Head: Normocephalic and atraumatic.  Mouth/Throat: Oropharynx is clear and moist.  Tonsils enlarged and cryptic.  No exudate.    Eyes: Conjunctivae are normal. Pupils are equal, round, and reactive to light. No scleral icterus.  Neck: Neck supple.  Cardiovascular: Normal rate, regular rhythm, normal heart sounds and intact distal pulses.   No murmur heard. Pulmonary/Chest: Effort normal and breath sounds normal. No stridor. No respiratory distress. She has no rales.  Abdominal: Soft. Bowel sounds are normal. She exhibits no distension. There is tenderness (mild) in the suprapubic area.  Musculoskeletal: Normal range of motion.  Neurological: She is alert and oriented to person, place, and time.  Skin: Skin is warm and dry. No  rash noted.  Psychiatric: She has a normal mood and affect. Her behavior is normal.    ED Course   Procedures (including critical care time)  Labs Reviewed  URINALYSIS, ROUTINE W REFLEX MICROSCOPIC - Abnormal; Notable for the following:    APPearance HAZY (*)    All other components within normal limits  RAPID STREP SCREEN  CULTURE, GROUP A STREP  POCT PREGNANCY, URINE   No results found.  EKG - sinus tachy, rate 126, normal intervals, normal conduction, nonspecific t wave changes, no priors available.  1. Acute URI     MDM  Fevers for 24 hours.   Febrile and tachycardic on arrival, but nontoxic and generally well appearing, sitting upright on bed.  Ambulatory without difficulty.  Complained primarily of some mild URI symptoms.  Vomiting a few times starting this morning.  Abdomen minimally tender without rebound/rebound/guarding. Tachycardia and nausea resolved with IV fluids.  She remained well appearing.  Tolerated PO fluids.  DC'd home with return precautions and PCP follow up.    Candyce Churn, MD 09/06/12 [phone]

## 2012-09-06 NOTE — ED Notes (Signed)
Dr. Wofford at [facility].  

## 2012-09-06 NOTE — ED Notes (Signed)
Report received, assumed care.  

## 2012-09-06 NOTE — ED Notes (Signed)
Patient states she had run a low grade fever since last night.   She advised that she started throwing up about 0200.   Patient states she took Ibuprofen around 0230 for fever.

## 2012-09-14 ENCOUNTER — Ambulatory Visit: Payer: Self-pay

## 2012-09-14 ENCOUNTER — Encounter (HOSPITAL_COMMUNITY): Payer: Self-pay | Admitting: Emergency Medicine

## 2012-09-14 DIAGNOSIS — S8010XA Contusion of unspecified lower leg, initial encounter: Secondary | ICD-10-CM | POA: Insufficient documentation

## 2012-09-14 DIAGNOSIS — Z8739 Personal history of other diseases of the musculoskeletal system and connective tissue: Secondary | ICD-10-CM | POA: Insufficient documentation

## 2012-09-14 DIAGNOSIS — Y9301 Activity, walking, marching and hiking: Secondary | ICD-10-CM | POA: Insufficient documentation

## 2012-09-14 DIAGNOSIS — E0590 Thyrotoxicosis, unspecified without thyrotoxic crisis or storm: Secondary | ICD-10-CM | POA: Insufficient documentation

## 2012-09-14 DIAGNOSIS — K219 Gastro-esophageal reflux disease without esophagitis: Secondary | ICD-10-CM | POA: Insufficient documentation

## 2012-09-14 DIAGNOSIS — F411 Generalized anxiety disorder: Secondary | ICD-10-CM | POA: Insufficient documentation

## 2012-09-14 DIAGNOSIS — F3289 Other specified depressive episodes: Secondary | ICD-10-CM | POA: Insufficient documentation

## 2012-09-14 DIAGNOSIS — S93409A Sprain of unspecified ligament of unspecified ankle, initial encounter: Secondary | ICD-10-CM | POA: Insufficient documentation

## 2012-09-14 DIAGNOSIS — F329 Major depressive disorder, single episode, unspecified: Secondary | ICD-10-CM | POA: Insufficient documentation

## 2012-09-14 DIAGNOSIS — Z79899 Other long term (current) drug therapy: Secondary | ICD-10-CM | POA: Insufficient documentation

## 2012-09-14 DIAGNOSIS — Y929 Unspecified place or not applicable: Secondary | ICD-10-CM | POA: Insufficient documentation

## 2012-09-14 DIAGNOSIS — Z91040 Latex allergy status: Secondary | ICD-10-CM | POA: Insufficient documentation

## 2012-09-14 DIAGNOSIS — W172XXA Fall into hole, initial encounter: Secondary | ICD-10-CM | POA: Insufficient documentation

## 2012-09-14 NOTE — ED Notes (Signed)
PT. STEPPED ON A HOLE WHILE WALKING THIS EVENING REPORTS PAIN AT LEFT ANKLE / AND LEFT SHIN . AMBULATORY.

## 2012-09-15 ENCOUNTER — Emergency Department (HOSPITAL_COMMUNITY): Payer: No Typology Code available for payment source

## 2012-09-15 ENCOUNTER — Emergency Department (HOSPITAL_COMMUNITY)
Admission: EM | Admit: 2012-09-15 | Discharge: 2012-09-15 | Disposition: A | Discharge status: Home / Self Care | Payer: No Typology Code available for payment source | Attending: Emergency Medicine | Admitting: Emergency Medicine

## 2012-09-15 ENCOUNTER — Emergency Department (HOSPITAL_COMMUNITY)
Admit: 2012-09-15 | Discharge: 2012-09-15 | Disposition: A | Discharge status: Home / Self Care | Payer: No Typology Code available for payment source | Attending: Emergency Medicine | Admitting: Emergency Medicine

## 2012-09-15 DIAGNOSIS — S93402A Sprain of unspecified ligament of left ankle, initial encounter: Secondary | ICD-10-CM

## 2012-09-15 DIAGNOSIS — S8012XA Contusion of left lower leg, initial encounter: Secondary | ICD-10-CM

## 2012-09-15 MED ORDER — IBUPROFEN 400 MG PO TABS
600.0000 mg | ORAL_TABLET | Freq: Once | ORAL | Status: AC
  Administered 2012-09-15: 600 mg via ORAL
  Filled 2012-09-15: qty 1

## 2012-09-15 MED ORDER — IBUPROFEN 600 MG PO TABS: 600.0000 mg | ORAL_TABLET | Freq: Four times a day (QID) | ORAL | Status: DC | PRN

## 2012-09-15 MED ORDER — IBUPROFEN 100 MG/5ML PO SUSP: 600.0000 mg | Freq: Once | ORAL | Status: DC

## 2012-09-15 NOTE — ED Notes (Signed)
Copy of xrays given to pt upon discharge.

## 2012-09-15 NOTE — ED Notes (Signed)
Ace wrap applied by Dr. Carolyne Littles to LLE.

## 2012-09-15 NOTE — ED Provider Notes (Signed)
CSN: [id number]     Arrival date & time 09/14/12  2344 History   First MD Initiated Contact with Patient 09/15/12 [phone]     Chief Complaint  Patient presents with  . Ankle Injury   (Consider location/radiation/quality/duration/timing/severity/associated sxs/prior Treatment) HPI Comments: Patient was walking earlier this evening when she stepped into a "hole". Patient is been complaining of left ankle and shin pain ever since that time. No medications have been taken. No other modifying factors identified. No history of fever.  Patient is a 22 y.o. female presenting with lower extremity injury. The history is provided by the patient and a relative.  Ankle Injury This is a new problem. The current episode started 1 to 2 hours ago. The problem occurs constantly. The problem has not changed since onset.Pertinent negatives include no chest pain, no abdominal pain, no headaches and no shortness of breath. The symptoms are aggravated by walking. Nothing relieves the symptoms. She has tried nothing for the symptoms. The treatment provided no relief.    Past Medical History  Diagnosis Date  . Gastric reflux   . Hyperthyroidism   . Anxiety   . Depression   . Scoliosis    History reviewed. No pertinent past surgical history. No family history on file. History  Substance Use Topics  . Smoking status: Never Smoker   . Smokeless tobacco: Never Used  . Alcohol Use: No   OB History   Grav Para Term Preterm Abortions TAB SAB Ect Mult Living                 Review of Systems  Respiratory: Negative for shortness of breath.   Cardiovascular: Negative for chest pain.  Gastrointestinal: Negative for abdominal pain.  Neurological: Negative for headaches.  All other systems reviewed and are negative.    Allergies  Amoxicillin-pot clavulanate; Cephalexin; Latex; and Vicodin  Home Medications   Current Outpatient Rx  Name  Route  Sig  Dispense  Refill  . FLUoxetine (PROZAC) 40 MG capsule  Oral   Take 1 capsule (40 mg total) by mouth daily.   30 capsule   3   . pantoprazole (PROTONIX) 40 MG tablet   Oral   Take 1 tablet (40 mg total) by mouth daily.   30 tablet   2   . SYNTHROID 150 MCG tablet   Oral   Take 1 tablet (150 mcg total) by mouth daily.   30 tablet   0     Dispense as written.    BP 120/95  Pulse 111  Temp(Src) 98.7 F (37.1 C) (Oral)  Resp 17  Ht 5\' 4"  (1.626 m)  Wt 210 lb (95.255 kg)  BMI 36.03 kg/m2  SpO2 97%  LMP 09/06/2012 Physical Exam  Nursing note and vitals reviewed. Constitutional: She is oriented to person, place, and time. She appears well-developed and well-nourished.  HENT:  Head: Normocephalic.  Right Ear: External ear normal.  Left Ear: External ear normal.  Nose: Nose normal.  Mouth/Throat: Oropharynx is clear and moist.  Eyes: EOM are normal. Pupils are equal, round, and reactive to light. Right eye exhibits no discharge. Left eye exhibits no discharge.  Neck: Normal range of motion. Neck supple. No tracheal deviation present.  No nuchal rigidity no meningeal signs  Cardiovascular: Normal rate and regular rhythm.   Pulmonary/Chest: Effort normal and breath sounds normal. No stridor. No respiratory distress. She has no wheezes. She has no rales.  Abdominal: Soft. She exhibits no distension and no mass. There  is no tenderness. There is no rebound and no guarding.  Musculoskeletal: Normal range of motion. She exhibits tenderness. She exhibits no edema.  Full range of motion at hip knee ankle and toes. Patient with contusion and bruising located over midshaft tibia and tenderness over left lateral malleolus. No metatarsal tenderness. No femur tenderness no pelvic tenderness noted. Neurovascularly intact distally.  Neurological: She is alert and oriented to person, place, and time. She has normal reflexes. No cranial nerve deficit. Coordination normal.  Skin: Skin is warm. No rash noted. She is not diaphoretic. No erythema. No  pallor.  No pettechia no purpura    ED Course  ORTHOPEDIC INJURY TREATMENT Date/Time: 09/15/2012 1:54 AM Performed by: Arley Phenix Authorized by: Arley Phenix Consent: Verbal consent obtained. Risks and benefits: risks, benefits and alternatives were discussed Consent given by: patient Patient understanding: patient states understanding of the procedure being performed Site marked: the operative site was marked Imaging studies: imaging studies available Patient identity confirmed: verbally with patient and arm band Time out: Immediately prior to procedure a "time out" was called to verify the correct patient, procedure, equipment, support staff and site/side marked as required. Injury location: ankle Location details: left ankle Injury type: soft tissue Pre-procedure neurovascular assessment: neurovascularly intact Pre-procedure distal perfusion: normal Pre-procedure neurological function: normal Local anesthesia used: no Patient sedated: no Immobilization: brace Splint type: ace wrap. Supplies used: elastic bandage Post-procedure neurovascular assessment: post-procedure neurovascularly intact Post-procedure distal perfusion: normal Post-procedure neurological function: normal Post-procedure range of motion: normal Patient tolerance: Patient tolerated the procedure well with no immediate complications.   (including critical care time) Labs Review Labs Reviewed - No data to display Imaging Review Dg Ankle Complete Left  09/15/2012   *RADIOLOGY REPORT*  Clinical Data: Lateral ankle pain status post fall  LEFT ANKLE COMPLETE - 3+ VIEW  Comparison: 11/12/2010  Findings: No displaced fracture.  No dislocation.  Ankle mortise intact.  No aggressive osseous lesions.  Os trigonum.  IMPRESSION: No acute osseous finding left ankle.  If clinical concern for a fracture persists, recommend a repeat radiograph in 5-10 days to evaluate for interval change or callus formation.    Original Report Authenticated By: Jearld Lesch, M.D.    MDM   1. Ankle sprain, left, initial encounter   2. Contusion, lower leg, left, initial encounter      MDM  xrays to rule out fracture or dislocation.  Motrin for pain.  Pt  agrees with plan   148a pain has improved with dose of Motrin here in the emergency room  154p x-rays negative for acute fracture.  Patient's leg and foot and an Ace wrap for support we'll discharge home. Patient does not wish for crutches at this time. Patient is neurovascularly intact distally at time of discharge home     Arley Phenix, MD 09/15/12 [phone]

## 2012-09-28 ENCOUNTER — Ambulatory Visit: Payer: No Typology Code available for payment source | Attending: Internal Medicine | Admitting: Internal Medicine

## 2012-09-28 VITALS — BP 119/80 | HR 65 | Temp 98.2°F | Resp 16 | Wt 234.2 lb

## 2012-09-28 DIAGNOSIS — F411 Generalized anxiety disorder: Secondary | ICD-10-CM

## 2012-09-28 DIAGNOSIS — E039 Hypothyroidism, unspecified: Secondary | ICD-10-CM | POA: Insufficient documentation

## 2012-09-28 MED ORDER — SYNTHROID 150 MCG PO TABS: 150.0000 ug | ORAL_TABLET | Freq: Every day | ORAL | Status: DC

## 2012-09-28 MED ORDER — FLUOXETINE HCL 40 MG PO CAPS: 40.0000 mg | ORAL_CAPSULE | Freq: Every day | ORAL | Status: DC

## 2012-09-28 NOTE — Addendum Note (Signed)
Addended by: Dorothea Ogle on: 09/28/2012 12:08 PM   Modules accepted: Orders

## 2012-09-28 NOTE — Progress Notes (Signed)
Patient ID: Kimberly [name], female   DOB: [birth year], 22 y.o.   MRN: [id number]  CC: follow up on blood tests  HPI: Pt is 22 yo female who present to clinic for follow up on blood tests. She says she was told her TSH was elevated and she has had it repeated and wants to know the results. She denies any chest pain or shortness of breath, no specific abdominal or urinary concerns. No recent sicknesses or hospitalizations.   Allergies  Allergen Reactions  . Amoxicillin-Pot Clavulanate Other (See Comments)    Stomach pains  . Cephalexin Other (See Comments)    Stomach pains   . Latex   . Vicodin [Hydrocodone-Acetaminophen] Hives and Rash   Past Medical History  Diagnosis Date  . Gastric reflux   . Hyperthyroidism   . Anxiety   . Depression   . Scoliosis    Current Outpatient Prescriptions on File Prior to Visit  Medication Sig Dispense Refill  . FLUoxetine (PROZAC) 40 MG capsule Take 1 capsule (40 mg total) by mouth daily.  30 capsule  3  . ibuprofen (ADVIL,MOTRIN) 600 MG tablet Take 1 tablet (600 mg total) by mouth every 6 (six) hours as needed for pain.  30 tablet  0  . pantoprazole (PROTONIX) 40 MG tablet Take 1 tablet (40 mg total) by mouth daily.  30 tablet  2  . SYNTHROID 150 MCG tablet Take 1 tablet (150 mcg total) by mouth daily.  30 tablet  0  . [DISCONTINUED] bethanechol (URECHOLINE) 5 MG tablet Take 5 mg by mouth 3 (three) times daily.        No current facility-administered medications on file prior to visit.   No family history of thyroid problems.  History   Social History  . Marital Status: Single    Spouse Name: N/A    Number of Children: N/A  . Years of Education: N/A   Occupational History  . Not on file.   Social History Main Topics  . Smoking status: Never Smoker   . Smokeless tobacco: Never Used  . Alcohol Use: No  . Drug Use: No  . Sexual Activity: Yes    Birth Control/ Protection: None   Other Topics Concern  . Not on file   Social History  Narrative  . No narrative on file    Review of Systems  Constitutional: Negative for fever, chills, diaphoresis, activity change, appetite change and fatigue.  HENT: Negative for ear pain, nosebleeds, congestion, facial swelling, rhinorrhea, neck pain, neck stiffness and ear discharge.   Eyes: Negative for pain, discharge, redness, itching and visual disturbance.  Respiratory: Negative for cough, choking, chest tightness, shortness of breath, wheezing and stridor.   Cardiovascular: Negative for chest pain, palpitations and leg swelling.  Gastrointestinal: Negative for abdominal distention.  Genitourinary: Negative for dysuria, urgency, frequency, hematuria, flank pain, decreased urine volume, difficulty urinating and dyspareunia.  Musculoskeletal: Negative for back pain, joint swelling, arthralgias and gait problem.  Neurological: Negative for dizziness, tremors, seizures, syncope, facial asymmetry, speech difficulty, weakness, light-headedness, numbness and headaches.  Hematological: Negative for adenopathy. Does not bruise/bleed easily.  Psychiatric/Behavioral: Negative for hallucinations, behavioral problems, confusion, dysphoric mood, decreased concentration and agitation.    Objective:   Filed Vitals:   09/28/12 1148  BP: 119/80  Pulse: 65  Temp: 98.2 F (36.8 C)  Resp: 16    Physical Exam  Constitutional: Appears well-developed and well-nourished. No distress.  HENT: Normocephalic. External right and left ear normal. Oropharynx is clear  and moist.  Eyes: Conjunctivae and EOM are normal. PERRLA, no scleral icterus.  Neck: Normal ROM. Neck supple. No JVD. No tracheal deviation. No thyromegaly.  CVS: RRR, S1/S2 +, no murmurs, no gallops, no carotid bruit.  Pulmonary: Effort and breath sounds normal, no stridor, rhonchi, wheezes, rales.  Abdominal: Soft. BS +,  no distension, tenderness, rebound or guarding.    Lab Results  Component Value Date   WBC 13.4* 11/21/2011   HGB  12.8 11/21/2011   HCT 38.5 11/21/2011   MCV 82.1 11/21/2011   PLT 313 11/21/2011   Lab Results  Component Value Date   CREATININE 0.49* 02/25/2012   BUN 10 02/25/2012   NA 139 02/25/2012   K 4.2 02/25/2012   CL 103 02/25/2012   CO2 24 02/25/2012    Lab Results  Component Value Date   HGBA1C 5.2 02/25/2012   Lipid Panel  No results found for this basename: chol, trig, hdl, cholhdl, vldl, ldlcalc       Assessment and plan:   Patient Active Problem List   Diagnosis Date Noted  . Hypothyroidism, acquired, autoimmune - stable and TSH within normal limits, will need to be repeated in 2 months  03/01/2011

## 2012-09-28 NOTE — Progress Notes (Signed)
Patient here for follow up Would like last lab results Needs referral to behavioral health

## 2012-09-28 NOTE — Patient Instructions (Signed)

## 2012-10-01 ENCOUNTER — Emergency Department (HOSPITAL_COMMUNITY)
Admission: EM | Admit: 2012-10-01 | Discharge: 2012-10-01 | Disposition: A | Discharge status: Home / Self Care | Payer: No Typology Code available for payment source | Attending: Emergency Medicine | Admitting: Emergency Medicine

## 2012-10-01 ENCOUNTER — Encounter (HOSPITAL_COMMUNITY): Payer: Self-pay | Admitting: Emergency Medicine

## 2012-10-01 DIAGNOSIS — F329 Major depressive disorder, single episode, unspecified: Secondary | ICD-10-CM | POA: Insufficient documentation

## 2012-10-01 DIAGNOSIS — R197 Diarrhea, unspecified: Secondary | ICD-10-CM

## 2012-10-01 DIAGNOSIS — Z79899 Other long term (current) drug therapy: Secondary | ICD-10-CM | POA: Insufficient documentation

## 2012-10-01 DIAGNOSIS — E0590 Thyrotoxicosis, unspecified without thyrotoxic crisis or storm: Secondary | ICD-10-CM | POA: Insufficient documentation

## 2012-10-01 DIAGNOSIS — R112 Nausea with vomiting, unspecified: Secondary | ICD-10-CM | POA: Insufficient documentation

## 2012-10-01 DIAGNOSIS — F411 Generalized anxiety disorder: Secondary | ICD-10-CM | POA: Insufficient documentation

## 2012-10-01 DIAGNOSIS — Z91040 Latex allergy status: Secondary | ICD-10-CM | POA: Insufficient documentation

## 2012-10-01 DIAGNOSIS — K219 Gastro-esophageal reflux disease without esophagitis: Secondary | ICD-10-CM | POA: Insufficient documentation

## 2012-10-01 DIAGNOSIS — Z3202 Encounter for pregnancy test, result negative: Secondary | ICD-10-CM | POA: Insufficient documentation

## 2012-10-01 DIAGNOSIS — F3289 Other specified depressive episodes: Secondary | ICD-10-CM | POA: Insufficient documentation

## 2012-10-01 DIAGNOSIS — Z8739 Personal history of other diseases of the musculoskeletal system and connective tissue: Secondary | ICD-10-CM | POA: Insufficient documentation

## 2012-10-01 LAB — CBC WITH DIFFERENTIAL/PLATELET
Eosinophils Absolute: 0.2 10*3/uL (ref 0.0–0.7)
Eosinophils Relative: 2 % (ref 0–5)
Hemoglobin: 10.8 g/dL — ABNORMAL LOW (ref 12.0–15.0)
Lymphocytes Relative: 26 % (ref 12–46)
Lymphs Abs: 2.7 10*3/uL (ref 0.7–4.0)
MCH: 26.5 pg (ref 26.0–34.0)
MCV: 83.3 fL (ref 78.0–100.0)
Monocytes Relative: 7 % (ref 3–12)
Neutrophils Relative %: 65 % (ref 43–77)
RBC: 4.08 MIL/uL (ref 3.87–5.11)
WBC: 10.3 10*3/uL (ref 4.0–10.5)

## 2012-10-01 LAB — COMPREHENSIVE METABOLIC PANEL
ALT: 11 U/L (ref 0–35)
Alkaline Phosphatase: 75 U/L (ref 39–117)
BUN: 7 mg/dL (ref 6–23)
CO2: 23 mEq/L (ref 19–32)
GFR calc Af Amer: >90 mL/min (ref >90)
GFR calc non Af Amer: >90 mL/min (ref >90)
Glucose, Bld: 89 mg/dL (ref 70–99)
Potassium: 3.7 mEq/L (ref 3.5–5.1)
Sodium: 137 mEq/L (ref 135–145)
Total Protein: 7 g/dL (ref 6.0–8.3)

## 2012-10-01 LAB — URINALYSIS, ROUTINE W REFLEX MICROSCOPIC
Ketones, ur: NEGATIVE mg/dL
Leukocytes, UA: NEGATIVE
Nitrite: NEGATIVE
Protein, ur: NEGATIVE mg/dL
Urobilinogen, UA: 0.2 mg/dL (ref 0.0–1.0)
pH: 5.5 (ref 5.0–8.0)

## 2012-10-01 LAB — POCT PREGNANCY, URINE: Preg Test, Ur: NEGATIVE

## 2012-10-01 MED ORDER — SODIUM CHLORIDE 0.9 % IV BOLUS (SEPSIS)
1000.0000 mL | Freq: Once | INTRAVENOUS | Status: AC
  Administered 2012-10-01: 1000 mL via INTRAVENOUS

## 2012-10-01 MED ORDER — MORPHINE SULFATE 4 MG/ML IJ SOLN
4.0000 mg | Freq: Once | INTRAMUSCULAR | Status: AC
  Administered 2012-10-01: 4 mg via INTRAVENOUS
  Filled 2012-10-01: qty 1

## 2012-10-01 MED ORDER — ONDANSETRON HCL 4 MG PO TABS: 4.0000 mg | ORAL_TABLET | Freq: Four times a day (QID) | ORAL | Status: DC

## 2012-10-01 MED ORDER — PROMETHAZINE HCL 25 MG/ML IJ SOLN
25.0000 mg | Freq: Once | INTRAMUSCULAR | Status: AC
  Administered 2012-10-01: 25 mg via INTRAVENOUS
  Filled 2012-10-01: qty 1

## 2012-10-01 NOTE — ED Notes (Signed)
Patient states she feels much better. Denies pain at this time.

## 2012-10-01 NOTE — ED Notes (Signed)
Pt c/o 2 days of mid abdominal pain with n/v/d. Pt denies change in diet or being around others that are sick.

## 2012-10-06 ENCOUNTER — Ambulatory Visit: Payer: No Typology Code available for payment source | Attending: Internal Medicine

## 2012-10-06 NOTE — ED Provider Notes (Signed)
CSN: [id number]     Arrival date & time 10/01/12  4098 History   First MD Initiated Contact with Patient 10/01/12 [phone]     Chief Complaint  Patient presents with  . Abdominal Pain  . Nausea  . Emesis   (Consider location/radiation/quality/duration/timing/severity/associated sxs/prior Treatment) HPI  22yf with n/v/d. Onset about 2 d ago. Multiple episodes of each. No blood, melena or coffee grounds. No sick contacts. Chills. No fever. No urinary complaints. No sick contacts. No recent significant travel.  Occasionally crampy abdominal pain around umbilicus. No unusual vaginal bleeding or discharge.  Past Medical History  Diagnosis Date  . Gastric reflux   . Hyperthyroidism   . Anxiety   . Depression   . Scoliosis    Past Surgical History  Procedure Laterality Date  . Upper gi endoscopy     History reviewed. No pertinent family history. History  Substance Use Topics  . Smoking status: Never Smoker   . Smokeless tobacco: Never Used  . Alcohol Use: No   OB History   Grav Para Term Preterm Abortions TAB SAB Ect Mult Living                 Review of Systems   All systems reviewed and negative, other than as noted in HPI.  Allergies  Amoxicillin-pot clavulanate; Cephalexin; Latex; Tape; and Vicodin  Home Medications   Current Outpatient Rx  Name  Route  Sig  Dispense  Refill  . FLUoxetine (PROZAC) 20 MG capsule   Oral   Take 20 mg by mouth 2 (two) times daily.         Marland Kitchen ibuprofen (ADVIL,MOTRIN) 600 MG tablet   Oral   Take 1 tablet (600 mg total) by mouth every 6 (six) hours as needed for pain.   30 tablet   0   . pantoprazole (PROTONIX) 40 MG tablet   Oral   Take 1 tablet (40 mg total) by mouth daily.   30 tablet   2   . SYNTHROID 150 MCG tablet   Oral   Take 1 tablet (150 mcg total) by mouth daily.   30 tablet   7     Dispense as written.   . ondansetron (ZOFRAN) 4 MG tablet   Oral   Take 1 tablet (4 mg total) by mouth every 6 (six) hours.   12  tablet   0    BP 105/62  Pulse 79  Temp(Src) 98.8 F (37.1 C) (Oral)  Resp 18  Ht 5\' 4"  (1.626 m)  Wt 234 lb 3 oz (106.227 kg)  BMI 40.18 kg/m2  SpO2 95%  LMP 09/06/2012 Physical Exam  Nursing note and vitals reviewed. Constitutional: She appears well-developed and well-nourished. No distress.  HENT:  Head: Normocephalic and atraumatic.  Eyes: Conjunctivae are normal. Right eye exhibits no discharge. Left eye exhibits no discharge.  Neck: Neck supple.  Cardiovascular: Normal rate, regular rhythm and normal heart sounds.  Exam reveals no gallop and no friction rub.   No murmur heard. Pulmonary/Chest: Effort normal and breath sounds normal. No respiratory distress.  Abdominal: Soft. She exhibits no distension. There is no tenderness.  Musculoskeletal: She exhibits no edema and no tenderness.  Neurological: She is alert.  Skin: Skin is warm and dry.  Psychiatric: She has a normal mood and affect. Her behavior is normal. Thought content normal.    ED Course  Procedures (including critical care time) Labs Review Labs Reviewed  CBC WITH DIFFERENTIAL - Abnormal; Notable for the  following:    Hemoglobin 10.8 (*)    HCT 34.0 (*)    All other components within normal limits  URINALYSIS, ROUTINE W REFLEX MICROSCOPIC - Abnormal; Notable for the following:    Color, Urine AMBER (*)    APPearance CLOUDY (*)    Bilirubin Urine SMALL (*)    All other components within normal limits  COMPREHENSIVE METABOLIC PANEL  POCT PREGNANCY, URINE   Imaging Review No results found.  MDM   1. Nausea vomiting and diarrhea    22yf with likely gastroenteritis. benign abdominal exam. clinically well hydrated. Continued symptomatic tx.     Raeford Razor, MD 10/06/12 [phone]

## 2012-11-16 ENCOUNTER — Encounter (HOSPITAL_COMMUNITY): Payer: Self-pay | Admitting: Emergency Medicine

## 2012-11-16 ENCOUNTER — Inpatient Hospital Stay (HOSPITAL_COMMUNITY)
Admission: EM | Admit: 2012-11-16 | Discharge: 2012-11-18 | DRG: 392 | Disposition: A | Discharge status: Home / Self Care | Payer: No Typology Code available for payment source | Attending: Internal Medicine | Admitting: Internal Medicine

## 2012-11-16 ENCOUNTER — Emergency Department (HOSPITAL_COMMUNITY): Payer: No Typology Code available for payment source

## 2012-11-16 DIAGNOSIS — E876 Hypokalemia: Secondary | ICD-10-CM | POA: Diagnosis present

## 2012-11-16 DIAGNOSIS — F329 Major depressive disorder, single episode, unspecified: Secondary | ICD-10-CM

## 2012-11-16 DIAGNOSIS — I498 Other specified cardiac arrhythmias: Secondary | ICD-10-CM | POA: Diagnosis present

## 2012-11-16 DIAGNOSIS — E071 Dyshormogenetic goiter: Secondary | ICD-10-CM | POA: Insufficient documentation

## 2012-11-16 DIAGNOSIS — E039 Hypothyroidism, unspecified: Secondary | ICD-10-CM | POA: Diagnosis present

## 2012-11-16 DIAGNOSIS — F411 Generalized anxiety disorder: Secondary | ICD-10-CM | POA: Diagnosis present

## 2012-11-16 DIAGNOSIS — E038 Other specified hypothyroidism: Secondary | ICD-10-CM

## 2012-11-16 DIAGNOSIS — R197 Diarrhea, unspecified: Secondary | ICD-10-CM | POA: Diagnosis present

## 2012-11-16 DIAGNOSIS — Z79899 Other long term (current) drug therapy: Secondary | ICD-10-CM

## 2012-11-16 DIAGNOSIS — Z6841 Body Mass Index (BMI) 40.0 and over, adult: Secondary | ICD-10-CM

## 2012-11-16 DIAGNOSIS — F3289 Other specified depressive episodes: Secondary | ICD-10-CM | POA: Diagnosis present

## 2012-11-16 DIAGNOSIS — A088 Other specified intestinal infections: Principal | ICD-10-CM | POA: Diagnosis present

## 2012-11-16 DIAGNOSIS — M4120 Other idiopathic scoliosis, site unspecified: Secondary | ICD-10-CM | POA: Diagnosis present

## 2012-11-16 DIAGNOSIS — Z23 Encounter for immunization: Secondary | ICD-10-CM

## 2012-11-16 DIAGNOSIS — E669 Obesity, unspecified: Secondary | ICD-10-CM | POA: Diagnosis present

## 2012-11-16 DIAGNOSIS — F341 Dysthymic disorder: Secondary | ICD-10-CM

## 2012-11-16 DIAGNOSIS — K5289 Other specified noninfective gastroenteritis and colitis: Secondary | ICD-10-CM

## 2012-11-16 DIAGNOSIS — R000 Tachycardia, unspecified: Secondary | ICD-10-CM | POA: Diagnosis present

## 2012-11-16 DIAGNOSIS — I10 Essential (primary) hypertension: Secondary | ICD-10-CM | POA: Diagnosis present

## 2012-11-16 DIAGNOSIS — O99213 Obesity complicating pregnancy, third trimester: Secondary | ICD-10-CM | POA: Diagnosis present

## 2012-11-16 DIAGNOSIS — E063 Autoimmune thyroiditis: Secondary | ICD-10-CM | POA: Diagnosis present

## 2012-11-16 DIAGNOSIS — K219 Gastro-esophageal reflux disease without esophagitis: Secondary | ICD-10-CM | POA: Diagnosis present

## 2012-11-16 DIAGNOSIS — K529 Noninfective gastroenteritis and colitis, unspecified: Secondary | ICD-10-CM

## 2012-11-16 HISTORY — DX: Adverse effect of unspecified anesthetic, initial encounter: T41.45XA

## 2012-11-16 HISTORY — DX: Cardiac arrhythmia, unspecified: I49.9

## 2012-11-16 HISTORY — DX: Iron deficiency anemia, unspecified: D50.9

## 2012-11-16 HISTORY — DX: Unspecified asthma, uncomplicated: J45.909

## 2012-11-16 HISTORY — DX: Migraine, unspecified, not intractable, without status migrainosus: G43.909

## 2012-11-16 HISTORY — DX: Schizophrenia, unspecified: F20.9

## 2012-11-16 HISTORY — DX: Other complications of anesthesia, initial encounter: T88.59XA

## 2012-11-16 HISTORY — DX: Dyshormogenetic goiter: E07.1

## 2012-11-16 LAB — CBC WITH DIFFERENTIAL/PLATELET
Basophils Relative: 0 % (ref 0–1)
Eosinophils Relative: 1 % (ref 0–5)
HCT: 37.9 % (ref 36.0–46.0)
Hemoglobin: 12.2 g/dL (ref 12.0–15.0)
MCHC: 32.2 g/dL (ref 30.0–36.0)
MCV: 82.9 fL (ref 78.0–100.0)
Monocytes Absolute: 1 10*3/uL (ref 0.1–1.0)
Monocytes Relative: 6 % (ref 3–12)
Neutro Abs: 14.8 10*3/uL — ABNORMAL HIGH (ref 1.7–7.7)
RDW: 13.5 % (ref 11.5–15.5)

## 2012-11-16 LAB — BASIC METABOLIC PANEL
CO2: 21 mEq/L (ref 19–32)
Calcium: 9.1 mg/dL (ref 8.4–10.5)
Creatinine, Ser: 0.53 mg/dL (ref 0.50–1.10)
GFR calc Af Amer: >90 mL/min (ref >90)
GFR calc non Af Amer: >90 mL/min (ref >90)
Sodium: 138 mEq/L (ref 135–145)

## 2012-11-16 LAB — PREGNANCY, URINE: Preg Test, Ur: NEGATIVE

## 2012-11-16 LAB — URINALYSIS, ROUTINE W REFLEX MICROSCOPIC
Bilirubin Urine: NEGATIVE
Glucose, UA: NEGATIVE mg/dL
Hgb urine dipstick: NEGATIVE
Ketones, ur: NEGATIVE mg/dL
Protein, ur: NEGATIVE mg/dL
Urobilinogen, UA: 0.2 mg/dL (ref 0.0–1.0)

## 2012-11-16 LAB — HEPATIC FUNCTION PANEL
ALT: 11 U/L (ref 0–35)
Bilirubin, Direct: <0.1 mg/dL (ref 0.0–0.3)

## 2012-11-16 LAB — RAPID URINE DRUG SCREEN, HOSP PERFORMED
Benzodiazepines: NOT DETECTED
Cocaine: NOT DETECTED
Opiates: NOT DETECTED
Tetrahydrocannabinol: NOT DETECTED

## 2012-11-16 LAB — LIPASE, BLOOD: Lipase: 28 U/L (ref 11–59)

## 2012-11-16 MED ORDER — ONDANSETRON HCL 4 MG/2ML IJ SOLN: 4.0000 mg | Freq: Four times a day (QID) | INTRAMUSCULAR | Status: DC | PRN

## 2012-11-16 MED ORDER — SODIUM CHLORIDE 0.9 % IJ SOLN
3.0000 mL | Freq: Two times a day (BID) | INTRAMUSCULAR | Status: DC
  Administered 2012-11-17: 3 mL via INTRAVENOUS

## 2012-11-16 MED ORDER — ONDANSETRON HCL 4 MG/2ML IJ SOLN
4.0000 mg | Freq: Once | INTRAMUSCULAR | Status: AC
  Administered 2012-11-16: 4 mg via INTRAVENOUS
  Filled 2012-11-16: qty 2

## 2012-11-16 MED ORDER — PROMETHAZINE HCL 25 MG/ML IJ SOLN
12.5000 mg | INTRAMUSCULAR | Status: AC
  Administered 2012-11-16: 12.5 mg via INTRAVENOUS
  Filled 2012-11-16: qty 1

## 2012-11-16 MED ORDER — INFLUENZA VAC SPLIT QUAD 0.5 ML IM SUSP
0.5000 mL | INTRAMUSCULAR | Status: AC
  Administered 2012-11-17: 0.5 mL via INTRAMUSCULAR
  Filled 2012-11-16: qty 0.5

## 2012-11-16 MED ORDER — ONDANSETRON HCL 4 MG PO TABS: 4.0000 mg | ORAL_TABLET | Freq: Four times a day (QID) | ORAL | Status: DC | PRN

## 2012-11-16 MED ORDER — SODIUM CHLORIDE 0.9 % IV SOLN
INTRAVENOUS | Status: DC
  Administered 2012-11-16: 20:00:00 via INTRAVENOUS

## 2012-11-16 MED ORDER — IOHEXOL 300 MG/ML  SOLN
25.0000 mL | INTRAMUSCULAR | Status: DC
  Administered 2012-11-16: 25 mL via ORAL

## 2012-11-16 MED ORDER — LEVOTHYROXINE SODIUM 150 MCG PO TABS
150.0000 ug | ORAL_TABLET | Freq: Every day | ORAL | Status: DC
  Administered 2012-11-17 – 2012-11-18 (×2): 150 ug via ORAL
  Filled 2012-11-16 (×3): qty 1

## 2012-11-16 MED ORDER — IOHEXOL 300 MG/ML  SOLN
100.0000 mL | Freq: Once | INTRAMUSCULAR | Status: AC | PRN
  Administered 2012-11-16: 100 mL via INTRAVENOUS

## 2012-11-16 MED ORDER — SODIUM CHLORIDE 0.9 % IV SOLN
INTRAVENOUS | Status: DC
  Administered 2012-11-16: 10:00:00 via INTRAVENOUS

## 2012-11-16 MED ORDER — SODIUM CHLORIDE 0.9 % IV BOLUS (SEPSIS)
1000.0000 mL | Freq: Once | INTRAVENOUS | Status: AC
  Administered 2012-11-16: 1000 mL via INTRAVENOUS

## 2012-11-16 MED ORDER — SODIUM CHLORIDE 0.9 % IV SOLN
INTRAVENOUS | Status: DC
  Administered 2012-11-16: 18:00:00 via INTRAVENOUS

## 2012-11-16 MED ORDER — ACETAMINOPHEN 325 MG PO TABS
650.0000 mg | ORAL_TABLET | Freq: Four times a day (QID) | ORAL | Status: DC | PRN
  Administered 2012-11-16 – 2012-11-17 (×2): 650 mg via ORAL
  Filled 2012-11-16 (×2): qty 2

## 2012-11-16 MED ORDER — ONDANSETRON HCL 4 MG/2ML IJ SOLN: 4.0000 mg | Freq: Three times a day (TID) | INTRAMUSCULAR | Status: DC | PRN

## 2012-11-16 MED ORDER — PANTOPRAZOLE SODIUM 40 MG PO TBEC
40.0000 mg | DELAYED_RELEASE_TABLET | Freq: Every day | ORAL | Status: DC
  Administered 2012-11-16 – 2012-11-18 (×3): 40 mg via ORAL
  Filled 2012-11-16 (×3): qty 1

## 2012-11-16 MED ORDER — FLUOXETINE HCL 20 MG PO CAPS
20.0000 mg | ORAL_CAPSULE | Freq: Two times a day (BID) | ORAL | Status: DC
  Administered 2012-11-16 – 2012-11-18 (×4): 20 mg via ORAL
  Filled 2012-11-16 (×6): qty 1

## 2012-11-16 MED ORDER — ACETAMINOPHEN 650 MG RE SUPP: 650.0000 mg | Freq: Four times a day (QID) | RECTAL | Status: DC | PRN

## 2012-11-16 NOTE — H&P (Signed)
[facility] History and Physical  [name]:[id number] DOB: 1990/10/05 DOA: 11/16/2012  Referring physician: Shelda Jakes, MD PCP: Conni Slipper, PA-C  Specialists:   Chief Complaint: Nausea, vomiting and diarrhea  HPI: Kimberly [name] is a 22 y.o. female with past medical history of hypothyroidism and depression came in to the hospital because of nausea, vomiting and diarrhea. Patient said she has not been feeling well for the past several days, 2 days ago her sister called acute gastroenteritis and she came in to the emergency department with the same symptoms. She started to have nausea and vomiting about 2 AM, she had about 11-12 times of loose bowel movements since then.  In the ED she was found to be tachycardic heart rate swinging between 120-150, she was given 3 L of normal saline, but she was still markedly tachycardia. Patient does not have any chest pain, shortness of breath or palpitations. Patient admitted to the hospital for further evaluation.  Review of Systems:  Constitutional: negative for anorexia, fevers and sweats Eyes: negative for irritation, redness and visual disturbance Ears, nose, mouth, throat, and face: negative for earaches, epistaxis, nasal congestion and sore throat Respiratory: negative for cough, dyspnea on exertion, sputum and wheezing Cardiovascular: negative for chest pain, dyspnea, lower extremity edema, orthopnea, palpitations and syncope Gastrointestinal: Per HPI Genitourinary:negative for dysuria, frequency and hematuria Hematologic/lymphatic: negative for bleeding, easy bruising and lymphadenopathy Musculoskeletal:negative for arthralgias, muscle weakness and stiff joints Neurological: negative for coordination problems, gait problems, headaches and weakness Endocrine: negative for diabetic symptoms including polydipsia, polyuria and weight loss Allergic/Immunologic: negative for anaphylaxis, hay fever and urticaria   Past  Medical History  Diagnosis Date  . Gastric reflux   . Anxiety   . Depression   . Scoliosis   . 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 Upper gi endoscopy     Social History:  reports that she has never smoked. She has never used smokeless tobacco. She reports that she does not drink alcohol or use illicit drugs.  Allergies  Allergen Reactions  . Abilify [Aripiprazole]     Lethargy, unable to function  . Amoxicillin-Pot Clavulanate Other (See Comments)    Stomach pains  . Cephalexin Other (See Comments)    Stomach pains   . Latex     Swelling, burning of skin .   Marland Kitchen Tape     Swelling, burning of skin .   Marland Kitchen Zicam Cold Remedy [Erysidoron #1] Nausea And Vomiting  . Vicodin [Hydrocodone-Acetaminophen] Hives and Rash    Family History  Problem Relation Age of Onset  . Thyroid disease Maternal Grandmother      Prior to Admission medications   Medication Sig Start Date End Date Taking? Authorizing Provider  FLUoxetine (PROZAC) 20 MG capsule Take 20 mg by mouth 2 (two) times daily.   Yes Historical Provider, MD  levothyroxine (SYNTHROID, LEVOTHROID) 150 MCG tablet Take 150 mcg by mouth daily before breakfast.   Yes Historical Provider, MD  pantoprazole (PROTONIX) 40 MG tablet Take 1 tablet (40 mg total) by mouth daily. 05/26/12  Yes Richarda Overlie, MD   Physical Exam: Filed Vitals:   11/16/12 1415  BP: 119/66  Pulse:   Temp:   Resp:    General appearance: alert, cooperative and no distress  Head: Normocephalic, without obvious abnormality, atraumatic  Eyes: conjunctivae/corneas clear. PERRL, EOM's intact. Fundi benign.  Nose: Nares normal. Septum midline. Mucosa normal. No  drainage or sinus tenderness.  Throat: lips, mucosa, and tongue normal; teeth and gums normal  Neck: Supple, no masses, no cervical lymphadenopathy, no JVD appreciated, no meningeal signs Resp: clear to auscultation bilaterally  Chest  wall: no tenderness  Cardio: Tachycardic, regular, no murmurs or gallop. GI: soft, non-tender; bowel sounds normal; no masses, no organomegaly  Extremities: extremities normal, atraumatic, no cyanosis or edema  Skin: Skin color, texture, turgor normal. No rashes or lesions  Neurologic: Alert and oriented X 3, normal strength and tone. Normal symmetric reflexes. Normal coordination and gait   Labs on Admission:  Basic Metabolic Panel:  Recent Labs Lab 11/16/12 0730  NA 138  K 3.4*  CL 102  CO2 21  GLUCOSE 119*  BUN 9  CREATININE 0.53  CALCIUM 9.1   Liver Function Tests:  Recent Labs Lab 11/16/12 1237  AST 31  ALT 11  ALKPHOS 67  BILITOT 0.5  PROT 6.7  ALBUMIN 3.1*    Recent Labs Lab 11/16/12 1237  LIPASE 28   No results found for this basename: AMMONIA,  in the last 168 hours CBC:  Recent Labs Lab 11/16/12 0730  WBC 16.9*  NEUTROABS 14.8*  HGB 12.2  HCT 37.9  MCV 82.9  PLT 408*   Cardiac Enzymes: No results found for this basename: CKTOTAL, CKMB, CKMBINDEX, TROPONINI,  in the last 168 hours  BNP (last 3 results) No results found for this basename: PROBNP,  in the last 8760 hours CBG: No results found for this basename: GLUCAP,  in the last 168 hours  Radiological Exams on Admission: Dg Chest 2 View  11/16/2012   CLINICAL DATA:  Tachycardia.  EXAM: CHEST  2 VIEW  COMPARISON:  07/26/2009.  FINDINGS: The heart size and mediastinal contours are within normal limits. Both lungs are clear. The visualized skeletal structures are unremarkable.  IMPRESSION: No active cardiopulmonary disease.   Electronically Signed   By: Loralie Champagne M.D.   On: 11/16/2012 12:55   Ct Abdomen Pelvis W Contrast  11/16/2012   CLINICAL DATA:  Epigastric pain. Nausea, vomiting, diarrhea.  EXAM: CT ABDOMEN AND PELVIS WITH CONTRAST  TECHNIQUE: Multidetector CT imaging of the abdomen and pelvis was performed using the standard protocol following bolus administration of intravenous  contrast.  CONTRAST:  OMNIPAQUE IOHEXOL 300 MG/ML  SOLN  COMPARISON:  04/22/2011.  FINDINGS: Lung Bases: Clear.  Liver: Fatty infiltration adjacent to the falciform ligament of the liver.  Spleen:  Normal.  Gallbladder:  Normal. No calcified stones or inflammatory changes.  Common bile duct:  Normal.  Pancreas:  Normal.  Adrenal glands:  Normal bilaterally.  Kidneys: Normal renal enhancement. No stones, obstruction, or hydronephrosis.  Stomach:  Normal. No inflammatory changes.  Small bowel:  Normal. No mesenteric adenopathy. No obstruction.  Colon: There are no right lower quadrant inflammatory changes. Normal appearance of the appendix. Colon opacifies with contrast.  Pelvic Genitourinary: Superior to the urinary bladder, there is a 10.4 cm transverse by 8.4 cm AP cystic lesion with would appears to be displaced ovarian tissue to the left at the superior margin. This likely represents a large ovarian cyst. Longus cranial caudal MEASUREMENTS is 11.7 cm. The right ovary appears within normal limits. The uterus appears normal. Urinary bladder is mildly distended but appears normal.  Bones:  No aggressive osseous lesions.  Vasculature: Within normal limits. There is an accessory E left lower pole renal artery.  Body Wall: Normal.  IMPRESSION: 1. 11.7 cm long axis benign appearing cystic lesion  in the anatomic pelvis, likely a left ovarian cyst. Compared to the prior exam, this appears little changed. Given the stability and no change in size/position, this is probably not related to the acute symptoms. It does not appear that ultrasound evaluation has been performed and a followup ultrasound at 6-12 weeks is recommended to further evaluate. Ultrasound is a better modality to evaluate the characteristics of the cyst than CT. This recommendation follows ACR consensus guidelines: White Paper of the ACR Incidental Findings Committee II on Adnexal Findings. J Am Coll Radiol 2013:10:675-681. 2. No acute abdominal  abnormality.   Electronically Signed   By: Andreas Newport M.D.   On: 11/16/2012 15:13    EKG: Independently reviewed.   Assessment/Plan Principal Problem:   Tachycardia Active Problems:   Obesity   Hypothyroidism, acquired, autoimmune   Hypertension   Acute gastroenteritis   Diarrhea   Acute gastroenteritis -Likely transient acute viral gastroenteritis, patient presented with nausea, vomiting and diarrhea. -She's already started to be better, no vomiting since she's been in the hospital but she still have some loose stools. -Will treat symptomatically with antiemetics, hydrate with IV fluids.  Tachycardia -Heart rate anywhere between 120 at rest and 155 with ambulation, EKG showed sinus tachycardia. -Even after patient hydrated with 3 L of IV fluids patient is still tachycardic. -Check troponin, rule out myocarditis picture, check TSH, repeat 12-lead EKG. -Patient had this problem before, I have asked cardiology to evaluate her.  Hypothyroidism -Patient is on 150 mcg of Synthroid, continue. -Check TSH.  Code Status: Full code Family Communication: Spoke with the patient's with mother at bedside. Disposition Plan: Observation, elementary  Time spent: 70 minutes  [facility] Pager [phone]  If 7PM-7AM, please contact night-coverage [email or web address] Password [email or web address] 11/16/2012, 5:00 PM

## 2012-11-16 NOTE — ED Notes (Addendum)
Pt states that around 2 am she started vomiting and having diarrhea non stop. Pt tried Zofran ODT with no success.

## 2012-11-16 NOTE — Consult Note (Signed)
Admit date: 11/16/2012 Referring Physician  Dr. Arthor Captain   Primary Cardiologist  Irving Shows Reason for Consultation  tachycardia  HPI: 22 y.o. female with past medical history of hypothyroidism and depression came in to the hospital because of nausea, vomiting and diarrhea. Patient said she has not been feeling well for the past several days, 2 days ago her sister called acute gastroenteritis and she came in to the emergency department with the same symptoms. She started to have nausea and vomiting about 2 AM, she had about 11-12 times of loose bowel movements since then.  In the ED she was found to be tachycardic heart rate swinging between 120-150, she was given 3 L of normal saline, but she was still markedly tachycardia. Patient does not have any chest pain, shortness of breath or palpitations. Patient admitted to the hospital for further evaluation.      PMH:   Past Medical History  Diagnosis Date  . Gastric reflux   . Anxiety   . Depression   . Scoliosis   . 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 Upper gi endoscopy      Allergies:  Abilify; Amoxicillin-pot clavulanate; Cephalexin; Latex; Tape; Zicam cold remedy; and Vicodin Prior to Admit Meds:   (Not in a hospital admission) Fam HX:    Family History  Problem Relation Age of Onset  . Thyroid disease Maternal Grandmother    Social HX:    History   Social History  . Marital Status: Single    Spouse Name: N/A    Number of Children: N/A  . Years of Education: N/A   Occupational History  . Not on file.   Social History Main Topics  . Smoking status: Never Smoker   . Smokeless tobacco: Never Used  . Alcohol Use: No  . Drug Use: No  . Sexual Activity: Yes    Birth Control/ Protection: None   Other Topics Concern  . Not on file   Social History Narrative  . No narrative on file     ROS:  All 11 ROS were addressed and are  negative except what is stated in the HPI  Physical Exam: Blood pressure 107/57, pulse 116, temperature 98.8 F (37.1 C), temperature source Oral, resp. rate 16, last menstrual period 11/01/2012, SpO2 99.00%.  General: Well developed, well nourished, appears ill Head:    Normal cephalic and atramatic  Lungs:   Clear bilaterally to auscultation and percussion. Heart:  Tachycardic S1 S2  No JVD.  Msk:  Back normal,Normal strength and tone for age. Extremities:  No edema.   Neuro: Alert and oriented X 3. Psych:  Flat affect, responds appropriately    Labs:   Lab Results  Component Value Date   WBC 16.9* 11/16/2012   HGB 12.2 11/16/2012   HCT 37.9 11/16/2012   MCV 82.9 11/16/2012   PLT 408* 11/16/2012    Recent Labs Lab 11/16/12 0730 11/16/12 1237  NA 138  --   K 3.4*  --   CL 102  --   CO2 21  --   BUN 9  --   CREATININE 0.53  --   CALCIUM 9.1  --   PROT  --  6.7  BILITOT  --  0.5  ALKPHOS  --  67  ALT  --  11  AST  --  31  GLUCOSE 119*  --    No results found  for this basename: PTT   No results found for this basename: INR, PROTIME   No results found for this basename: CKTOTAL, CKMB, CKMBINDEX, TROPONINI     No results found for this basename: CHOL   No results found for this basename: HDL   No results found for this basename: LDLCALC   No results found for this basename: TRIG   No results found for this basename: CHOLHDL   No results found for this basename: LDLDIRECT      Radiology:  Dg Chest 2 View  11/16/2012   CLINICAL DATA:  Tachycardia.  EXAM: CHEST  2 VIEW  COMPARISON:  07/26/2009.  FINDINGS: The heart size and mediastinal contours are within normal limits. Both lungs are clear. The visualized skeletal structures are unremarkable.  IMPRESSION: No active cardiopulmonary disease.   Electronically Signed   By: Loralie Champagne M.D.   On: 11/16/2012 12:55   Ct Abdomen Pelvis W Contrast  11/16/2012   CLINICAL DATA:  Epigastric pain. Nausea,  vomiting, diarrhea.  EXAM: CT ABDOMEN AND PELVIS WITH CONTRAST  TECHNIQUE: Multidetector CT imaging of the abdomen and pelvis was performed using the standard protocol following bolus administration of intravenous contrast.  CONTRAST:  OMNIPAQUE IOHEXOL 300 MG/ML  SOLN  COMPARISON:  04/22/2011.  FINDINGS: Lung Bases: Clear.  Liver: Fatty infiltration adjacent to the falciform ligament of the liver.  Spleen:  Normal.  Gallbladder:  Normal. No calcified stones or inflammatory changes.  Common bile duct:  Normal.  Pancreas:  Normal.  Adrenal glands:  Normal bilaterally.  Kidneys: Normal renal enhancement. No stones, obstruction, or hydronephrosis.  Stomach:  Normal. No inflammatory changes.  Small bowel:  Normal. No mesenteric adenopathy. No obstruction.  Colon: There are no right lower quadrant inflammatory changes. Normal appearance of the appendix. Colon opacifies with contrast.  Pelvic Genitourinary: Superior to the urinary bladder, there is a 10.4 cm transverse by 8.4 cm AP cystic lesion with would appears to be displaced ovarian tissue to the left at the superior margin. This likely represents a large ovarian cyst. Longus cranial caudal MEASUREMENTS is 11.7 cm. The right ovary appears within normal limits. The uterus appears normal. Urinary bladder is mildly distended but appears normal.  Bones:  No aggressive osseous lesions.  Vasculature: Within normal limits. There is an accessory E left lower pole renal artery.  Body Wall: Normal.  IMPRESSION: 1. 11.7 cm long axis benign appearing cystic lesion in the anatomic pelvis, likely a left ovarian cyst. Compared to the prior exam, this appears little changed. Given the stability and no change in size/position, this is probably not related to the acute symptoms. It does not appear that ultrasound evaluation has been performed and a followup ultrasound at 6-12 weeks is recommended to further evaluate. Ultrasound is a better modality to evaluate the  characteristics of the cyst than CT. This recommendation follows ACR consensus guidelines: White Paper of the ACR Incidental Findings Committee II on Adnexal Findings. J Am Coll Radiol 2013:10:675-681. 2. No acute abdominal abnormality.   Electronically Signed   By: Andreas Newport M.D.   On: 11/16/2012 15:13    EKG:  Sinus tachycardia  ASSESSMENT: Sinus tachycardia likely related to illness, despite volume resuscitation.  PLAN:  Doubt serious cardiac issue.  Check echo for LV function, especialy given thyroid disease, and also to r/o other structural heart disease.  Increased WBC  Hypokalemia  Lougenia Morrissey S., MD  11/16/2012  6:44 PM

## 2012-11-16 NOTE — ED Notes (Signed)
Pt finished first cup of contrast. CT notified.

## 2012-11-16 NOTE — Discharge Planning (Signed)
[facility]- Tivis Ringer, Community Liaison  Follow up appointment was made with the patients PCP at the [facility] for 12/01/12 at 3:15pm. Patient is aware of the appointment and was given my contact information for any questions or concerns. 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

## 2012-11-16 NOTE — ED Provider Notes (Signed)
CSN: [id number]     Arrival date & time 11/16/12  0630 History   First MD Initiated Contact with Patient 11/16/12 [phone]     Chief Complaint  Patient presents with  . Vomiting   (Consider location/radiation/quality/duration/timing/severity/associated sxs/prior Treatment) The history is provided by the patient.   22 year old female acute onset of nausea and vomiting occurred about 3 in the morning. No blood in the vomit no blood in the diarrhea. Vomited about 10 times diarrhea about 10 times. Recent sick exposure patient's sister recently had similar illness. Not associated with abdominal pain other than some mild crampy pain. No dysuria no fevers no headache.  Past Medical History  Diagnosis Date  . Gastric reflux   . Anxiety   . Depression   . Scoliosis   . 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 Upper gi endoscopy     History reviewed. No pertinent family history. History  Substance Use Topics  . Smoking status: Never Smoker   . Smokeless tobacco: Never Used  . Alcohol Use: No   OB History   Grav Para Term Preterm Abortions TAB SAB Ect Mult Living   0              Review of Systems  Constitutional: Negative for fever and diaphoresis.  HENT: Negative for congestion.   Eyes: Negative for redness.  Respiratory: Negative for shortness of breath.   Cardiovascular: Negative for chest pain and leg swelling.  Gastrointestinal: Positive for nausea, vomiting, abdominal pain and diarrhea. Negative for blood in stool.  Genitourinary: Negative for dysuria.  Musculoskeletal: Positive for myalgias. Negative for back pain.  Skin: Negative for rash.  Neurological: Negative for headaches.  Hematological: Does not bruise/bleed easily.  Psychiatric/Behavioral: Negative for confusion.    Allergies  Abilify; Amoxicillin-pot clavulanate; Cephalexin; Latex; Tape; Zicam cold remedy; and Vicodin  Home Medications    Current Outpatient Rx  Name  Route  Sig  Dispense  Refill  . FLUoxetine (PROZAC) 20 MG capsule   Oral   Take 20 mg by mouth 2 (two) times daily.         Marland Kitchen levothyroxine (SYNTHROID, LEVOTHROID) 150 MCG tablet   Oral   Take 150 mcg by mouth daily before breakfast.         . pantoprazole (PROTONIX) 40 MG tablet   Oral   Take 1 tablet (40 mg total) by mouth daily.   30 tablet   2    BP 119/66  Pulse 125  Temp(Src) 98.8 F (37.1 C) (Oral)  Resp 24  SpO2 97%  LMP 11/01/2012 Physical Exam  Nursing note and vitals reviewed. Constitutional: She is oriented to person, place, and time. She appears well-developed and well-nourished. No distress.  HENT:  Head: Normocephalic and atraumatic.  Mouth/Throat: Oropharynx is clear and moist.  Eyes: Conjunctivae and EOM are normal. Pupils are equal, round, and reactive to light.  Neck: Normal range of motion.  Cardiovascular: Regular rhythm, normal heart sounds and intact distal pulses.   No murmur heard. Tachycardia  Pulmonary/Chest: Effort normal and breath sounds normal. No respiratory distress.  Abdominal: Soft. Bowel sounds are normal. There is no tenderness.  Musculoskeletal: Normal range of motion. She exhibits no edema and no tenderness.  Neurological: She is alert and oriented to person, place, and time. No cranial nerve deficit. She exhibits normal muscle tone. Coordination normal.  Skin: Skin is warm. No rash noted.    ED Course  Procedures (including critical care time) Labs Review Labs Reviewed  URINALYSIS, ROUTINE W REFLEX MICROSCOPIC - Abnormal; Notable for the following:    APPearance HAZY (*)    All other components within normal limits  CBC WITH DIFFERENTIAL - Abnormal; Notable for the following:    WBC 16.9 (*)    Platelets 408 (*)    Neutrophils Relative % 88 (*)    Neutro Abs 14.8 (*)    Lymphocytes Relative 6 (*)    All other components within normal limits  BASIC METABOLIC PANEL - Abnormal; Notable  for the following:    Potassium 3.4 (*)    Glucose, Bld 119 (*)    All other components within normal limits  HEPATIC FUNCTION PANEL - Abnormal; Notable for the following:    Albumin 3.1 (*)    All other components within normal limits  PREGNANCY, URINE  LIPASE, BLOOD   Results for orders placed during the hospital encounter of 11/16/12  URINALYSIS, ROUTINE W REFLEX MICROSCOPIC      Result Value Range   Color, Urine YELLOW  YELLOW   APPearance HAZY (*) CLEAR   Specific Gravity, Urine 1.021  1.005 - 1.030   pH 5.5  5.0 - 8.0   Glucose, UA NEGATIVE  NEGATIVE mg/dL   Hgb urine dipstick NEGATIVE  NEGATIVE   Bilirubin Urine NEGATIVE  NEGATIVE   Ketones, ur NEGATIVE  NEGATIVE mg/dL   Protein, ur NEGATIVE  NEGATIVE mg/dL   Urobilinogen, UA 0.2  0.0 - 1.0 mg/dL   Nitrite NEGATIVE  NEGATIVE   Leukocytes, UA NEGATIVE  NEGATIVE  PREGNANCY, URINE      Result Value Range   Preg Test, Ur NEGATIVE  NEGATIVE  CBC WITH DIFFERENTIAL      Result Value Range   WBC 16.9 (*) 4.0 - 10.5 K/uL   RBC 4.57  3.87 - 5.11 MIL/uL   Hemoglobin 12.2  12.0 - 15.0 g/dL   HCT 16.1  09.6 - 04.5 %   MCV 82.9  78.0 - 100.0 fL   MCH 26.7  26.0 - 34.0 pg   MCHC 32.2  30.0 - 36.0 g/dL   RDW 40.9  81.1 - 91.4 %   Platelets 408 (*) 150 - 400 K/uL   Neutrophils Relative % 88 (*) 43 - 77 %   Neutro Abs 14.8 (*) 1.7 - 7.7 K/uL   Lymphocytes Relative 6 (*) 12 - 46 %   Lymphs Abs 1.0  0.7 - 4.0 K/uL   Monocytes Relative 6  3 - 12 %   Monocytes Absolute 1.0  0.1 - 1.0 K/uL   Eosinophils Relative 1  0 - 5 %   Eosinophils Absolute 0.1  0.0 - 0.7 K/uL   Basophils Relative 0  0 - 1 %   Basophils Absolute 0.0  0.0 - 0.1 K/uL  BASIC METABOLIC PANEL      Result Value Range   Sodium 138  135 - 145 mEq/L   Potassium 3.4 (*) 3.5 - 5.1 mEq/L   Chloride 102  96 - 112 mEq/L   CO2 21  19 - 32 mEq/L   Glucose, Bld 119 (*) 70 - 99 mg/dL   BUN 9  6 - 23 mg/dL   Creatinine, Ser 7.82  0.50 - 1.10 mg/dL   Calcium 9.1  8.4 -  95.6 mg/dL   GFR calc non Af Amer >90  >90 mL/min   GFR calc Af Amer >90  >90 mL/min  LIPASE, BLOOD  Result Value Range   Lipase 28  11 - 59 U/L  HEPATIC FUNCTION PANEL      Result Value Range   Total Protein 6.7  6.0 - 8.3 g/dL   Albumin 3.1 (*) 3.5 - 5.2 g/dL   AST 31  0 - 37 U/L   ALT 11  0 - 35 U/L   Alkaline Phosphatase 67  39 - 117 U/L   Total Bilirubin 0.5  0.3 - 1.2 mg/dL   Bilirubin, Direct <0.9  0.0 - 0.3 mg/dL   Indirect Bilirubin NOT CALCULATED  0.3 - 0.9 mg/dL    Imaging Review Dg Chest 2 View  11/16/2012   CLINICAL DATA:  Tachycardia.  EXAM: CHEST  2 VIEW  COMPARISON:  07/26/2009.  FINDINGS: The heart size and mediastinal contours are within normal limits. Both lungs are clear. The visualized skeletal structures are unremarkable.  IMPRESSION: No active cardiopulmonary disease.   Electronically Signed   By: Loralie Champagne M.D.   On: 11/16/2012 12:55   Ct Abdomen Pelvis W Contrast  11/16/2012   CLINICAL DATA:  Epigastric pain. Nausea, vomiting, diarrhea.  EXAM: CT ABDOMEN AND PELVIS WITH CONTRAST  TECHNIQUE: Multidetector CT imaging of the abdomen and pelvis was performed using the standard protocol following bolus administration of intravenous contrast.  CONTRAST:  OMNIPAQUE IOHEXOL 300 MG/ML  SOLN  COMPARISON:  04/22/2011.  FINDINGS: Lung Bases: Clear.  Liver: Fatty infiltration adjacent to the falciform ligament of the liver.  Spleen:  Normal.  Gallbladder:  Normal. No calcified stones or inflammatory changes.  Common bile duct:  Normal.  Pancreas:  Normal.  Adrenal glands:  Normal bilaterally.  Kidneys: Normal renal enhancement. No stones, obstruction, or hydronephrosis.  Stomach:  Normal. No inflammatory changes.  Small bowel:  Normal. No mesenteric adenopathy. No obstruction.  Colon: There are no right lower quadrant inflammatory changes. Normal appearance of the appendix. Colon opacifies with contrast.  Pelvic Genitourinary: Superior to the urinary bladder,  there is a 10.4 cm transverse by 8.4 cm AP cystic lesion with would appears to be displaced ovarian tissue to the left at the superior margin. This likely represents a large ovarian cyst. Longus cranial caudal MEASUREMENTS is 11.7 cm. The right ovary appears within normal limits. The uterus appears normal. Urinary bladder is mildly distended but appears normal.  Bones:  No aggressive osseous lesions.  Vasculature: Within normal limits. There is an accessory E left lower pole renal artery.  Body Wall: Normal.  IMPRESSION: 1. 11.7 cm long axis benign appearing cystic lesion in the anatomic pelvis, likely a left ovarian cyst. Compared to the prior exam, this appears little changed. Given the stability and no change in size/position, this is probably not related to the acute symptoms. It does not appear that ultrasound evaluation has been performed and a followup ultrasound at 6-12 weeks is recommended to further evaluate. Ultrasound is a better modality to evaluate the characteristics of the cyst than CT. This recommendation follows ACR consensus guidelines: White Paper of the ACR Incidental Findings Committee II on Adnexal Findings. J Am Coll Radiol 2013:10:675-681. 2. No acute abdominal abnormality.   Electronically Signed   By: Andreas Newport M.D.   On: 11/16/2012 15:13    EKG Interpretation   None      Date: 11/16/2012  Rate: 128  Rhythm: sinus tachycardia  QRS Axis: normal  Intervals: normal  ST/T Wave abnormalities: nonspecific ST/T changes  Conduction Disutrbances:none  Narrative Interpretation:   Old EKG Reviewed: unchanged No sniffing  change in EKG compared to 09/06/2012.   MDM   1. Gastroenteritis   2. Tachycardia    Patient clinically seems gastroenteritis. Vomiting has improved diarrhea is improved some. Patient has been ambulatory no near syncopal feelings or lightheadedness however she has had persistent tachycardia was fairly remarkable anywhere from 154-120. Patient given plenty  of fluids urinating briskly. Said no longer clinically dehydrated. Patient has a history of hypothyroidism and is on thyroid medication some concern that she may be hyperthyroid. We'll discuss with admission through the hospitalist team. She is followed by the wellness clinic. Patients a CT of the abdomen is negative liver function tests without any sniffing abnormalities no evidence of pancreatitis. No evidence of acute abdominal process. Chest x-ray is also negative. There is a leukocytosis but is most likely probably related to a viral gastroenteritis. Patient's sister has similar illness a few days ago. The admission is for the persistent tachycardia. EKG is consistent with sinus tach.    Shelda Jakes, MD 11/16/12 [phone]

## 2012-11-17 ENCOUNTER — Other Ambulatory Visit: Payer: Self-pay

## 2012-11-17 LAB — CLOSTRIDIUM DIFFICILE BY PCR: Toxigenic C. Difficile by PCR: NEGATIVE

## 2012-11-17 LAB — TSH: TSH: 1.057 u[IU]/mL (ref 0.350–4.500)

## 2012-11-17 NOTE — Progress Notes (Signed)
[facility] PROGRESS NOTE  Kimberly [name] ZOX:[id number] DOB: 07/07/1990 DOA: 11/16/2012 PCP: Conni Slipper, PA-C   Brief narrative 22 year old female with history of hypothyroidism and depression presented to the hospital with several episodes of watery diarrhea for 2 days and  along with nausea and vomiting on the day of admission. Her sister had a similar problem a few days ago. In the ED she was noted to have persistent tachycardia despite 3 L of normal saline and was admitted to the hospital for further workup. Abdominal CT was negative. Patient  also had leucocytosis. Denies recent travel, antibiotics or eating outside.   Assessment/Plan: Acute gastroenteritis Possibly viral. Patient is febrile this morning. He has only had 2 episodes of diarrhea overnight and her vomiting has resolved. Heart rate is  stable on telemetry. Will check stool for C. Difficile. Blood culture sent. Continue IV hydration and supportive care  Tachycardia Has improved this morning. Pedal cardiology was consulted for the same he did TSH within normal limits. 2-D echo ordered for was canceled it as heart rate is better now with IV hydration and improvement in her diarrhea.  Code Status: Full code Family Communication : family at bedside Disposition Plan: Home likely tomorrow if continues to improve   Consultants:  [facility] cardiology  Procedures:  none  Antibiotics:  none  HPI/Subjective: No further nausea or vomiting. Diarrhea improving. Patient feels that overall  Objective: Filed Vitals:   11/17/12 0751  BP: 118/82  Pulse: 86  Temp:   Resp: 15    Intake/Output Summary (Last 24 hours) at 11/17/12 1054 Last data filed at 11/17/12 0700  Gross per 24 hour  Intake 1712.5 ml  Output      1 ml  Net 1711.5 ml   Filed Weights   11/16/12 1850 11/17/12 0751  Weight: 109.589 kg (241 lb 9.6 oz) 108 kg (238 lb 1.6 oz)    Exam:   General:  Young female in no acute  distress  HEENT: No pallor, dry oral mucosa, no icterus  Cardiovascular: Normal S1 and S2, no murmurs rub or gallop  Chest: Clear to auscultation bilaterally  Abdomen: Soft, nontender, nondistended, bowel sounds present  Extremities: Warm, no edema  CNS: AAO x3   Data Reviewed: Basic Metabolic Panel:  Recent Labs Lab 11/16/12 0730  NA 138  K 3.4*  CL 102  CO2 21  GLUCOSE 119*  BUN 9  CREATININE 0.53  CALCIUM 9.1   Liver Function Tests:  Recent Labs Lab 11/16/12 1237  AST 31  ALT 11  ALKPHOS 67  BILITOT 0.5  PROT 6.7  ALBUMIN 3.1*    Recent Labs Lab 11/16/12 1237  LIPASE 28   No results found for this basename: AMMONIA,  in the last 168 hours CBC:  Recent Labs Lab 11/16/12 0730  WBC 16.9*  NEUTROABS 14.8*  HGB 12.2  HCT 37.9  MCV 82.9  PLT 408*   Cardiac Enzymes:  Recent Labs Lab 11/16/12 2010  TROPONINI <0.30   BNP (last 3 results) No results found for this basename: PROBNP,  in the last 8760 hours CBG: No results found for this basename: GLUCAP,  in the last 168 hours  Recent Results (from the past 240 hour(s))  CLOSTRIDIUM DIFFICILE BY PCR     Status: None   Collection Time    11/17/12  8:56 AM      Result Value Range Status   C difficile by pcr NEGATIVE  NEGATIVE Final     Studies: Dg  Chest 2 View  11/16/2012   CLINICAL DATA:  Tachycardia.  EXAM: CHEST  2 VIEW  COMPARISON:  07/26/2009.  FINDINGS: The heart size and mediastinal contours are within normal limits. Both lungs are clear. The visualized skeletal structures are unremarkable.  IMPRESSION: No active cardiopulmonary disease.   Electronically Signed   By: Loralie Champagne M.D.   On: 11/16/2012 12:55   Ct Abdomen Pelvis W Contrast  11/16/2012   CLINICAL DATA:  Epigastric pain. Nausea, vomiting, diarrhea.  EXAM: CT ABDOMEN AND PELVIS WITH CONTRAST  TECHNIQUE: Multidetector CT imaging of the abdomen and pelvis was performed using the standard protocol following bolus  administration of intravenous contrast.  CONTRAST:  OMNIPAQUE IOHEXOL 300 MG/ML  SOLN  COMPARISON:  04/22/2011.  FINDINGS: Lung Bases: Clear.  Liver: Fatty infiltration adjacent to the falciform ligament of the liver.  Spleen:  Normal.  Gallbladder:  Normal. No calcified stones or inflammatory changes.  Common bile duct:  Normal.  Pancreas:  Normal.  Adrenal glands:  Normal bilaterally.  Kidneys: Normal renal enhancement. No stones, obstruction, or hydronephrosis.  Stomach:  Normal. No inflammatory changes.  Small bowel:  Normal. No mesenteric adenopathy. No obstruction.  Colon: There are no right lower quadrant inflammatory changes. Normal appearance of the appendix. Colon opacifies with contrast.  Pelvic Genitourinary: Superior to the urinary bladder, there is a 10.4 cm transverse by 8.4 cm AP cystic lesion with would appears to be displaced ovarian tissue to the left at the superior margin. This likely represents a large ovarian cyst. Longus cranial caudal MEASUREMENTS is 11.7 cm. The right ovary appears within normal limits. The uterus appears normal. Urinary bladder is mildly distended but appears normal.  Bones:  No aggressive osseous lesions.  Vasculature: Within normal limits. There is an accessory E left lower pole renal artery.  Body Wall: Normal.  IMPRESSION: 1. 11.7 cm long axis benign appearing cystic lesion in the anatomic pelvis, likely a left ovarian cyst. Compared to the prior exam, this appears little changed. Given the stability and no change in size/position, this is probably not related to the acute symptoms. It does not appear that ultrasound evaluation has been performed and a followup ultrasound at 6-12 weeks is recommended to further evaluate. Ultrasound is a better modality to evaluate the characteristics of the cyst than CT. This recommendation follows ACR consensus guidelines: White Paper of the ACR Incidental Findings Committee II on Adnexal Findings. J Am Coll Radiol  2013:10:675-681. 2. No acute abdominal abnormality.   Electronically Signed   By: Andreas Newport M.D.   On: 11/16/2012 15:13    Scheduled Meds: . FLUoxetine  20 mg Oral BID  . influenza vac split quadrivalent PF  0.5 mL Intramuscular Tomorrow-1000  . levothyroxine  150 mcg Oral QAC breakfast  . pantoprazole  40 mg Oral QAC breakfast  . sodium chloride  3 mL Intravenous Q12H   Continuous Infusions: . sodium chloride 150 mL/hr at 11/16/12 1935      Time spent: 25 minutes    Iliyah Bui  [facility] Pager [phone]. If 7PM-7AM, please contact night-coverage at [email or web address], password [facility] 11/17/2012, 10:54 AM  LOS: 1 day

## 2012-11-17 NOTE — Care Management Note (Unsigned)
    Page 1 of 1   11/17/2012     4:05:43 PM   CARE MANAGEMENT NOTE 11/17/2012  Patient:  Kimberly [name], Kimberly [name]   Account Number:  [id number]  Date Initiated:  11/17/2012  Documentation initiated by:  Adriano Bischof  Subjective/Objective Assessment:   PT ADM WITH N/V/D ON 11/16/12.  PTA, PT INDEPENDENT, LIVES WITH MOTHER.     Action/Plan:   WILL FOLLOW FOR DISCHARGE NEEDS AS PT PROGRESSES.   Anticipated DC Date:  11/19/2012   Anticipated DC Plan:  HOME/SELF CARE      DC Planning Services  CM consult      Choice offered to / List presented to:             Status of service:  In process, will continue to follow Medicare Important Message given?   (If response is "NO", the following Medicare IM given date fields will be blank) Date Medicare IM given:   Date Additional Medicare IM given:    Discharge Disposition:    Per UR Regulation:  Reviewed for med. necessity/level of care/duration of stay  If discussed at Long Length of Stay Meetings, dates discussed:    Comments:

## 2012-11-18 NOTE — Discharge Summary (Signed)
Physician Discharge Summary  Kimberly [name] JYN:[id number] DOB: Nov 29, 1990 DOA: 11/16/2012  PCP: Jeanann Lewandowsky, MD  Admit date: 11/16/2012 Discharge date: 11/18/2012  Time spent: 40 minutes  Recommendations for Outpatient Follow-up:  D/c home [facility]  Discharge Diagnoses:  Principal Problem:   Acute gastroenteritis  Active Problems:   Tachycardia   Obesity   Hypothyroidism, acquired, autoimmune   Diarrhea   Discharge Condition: fair  Diet recommendation: regular  Filed Weights   11/16/12 1850 11/17/12 0751  Weight: 109.589 kg (241 lb 9.6 oz) 108 kg (238 lb 1.6 oz)    History of present illness:  22 year old female with history of hypothyroidism and depression presented to the hospital with several episodes of watery diarrhea for 2 days and along with nausea and vomiting on the day of admission. Her sister had a similar problem a few days ago. In the ED she was noted to have persistent tachycardia despite 3 L of normal saline and was admitted to the hospital for further workup. Abdominal CT was negative. Patient also had leucocytosis. Denies recent travel, antibiotics or eating outside.   Hospital Course:    Acute gastroenteritis with persistent tachycardia Possibly viral. Patient was febrile next  morning. continued on aggressive IV hydration and symptoms improved. Tachycardia resolved. TSH was wnl. Her nausea, vomiting and diarrhea resolved on second day. She remains afebrile for past 24 hrs. Stool for C diff was negative.  Heart rate is stable on telemetry.   stool for C. Difficile was negative. Blood culture preliminary shows no growth.  She sis table for discharge home with outpatient follow up.        Consultants:  [facility] Procedures:  none Antibiotics:  none       Discharge Exam: Filed Vitals:   11/18/12 0622  BP: 102/82  Pulse:   Temp:   Resp:     General: Young female in no acute  distress  HEENT: No pallor, moist oral mucosa,  Cardiovascular: Normal S1 and S2, no murmurs rub or gallop Chest: Clear to auscultation bilaterally Abdomen: Soft, nontender, nondistended, bowel sounds present Extremities: Warm, no edema CNS: AAO x3  Discharge Instructions   Future Appointments Provider Department Dept Phone   12/01/2012 3:15 PM Jeanann Lewandowsky, MD [facility] [phone]       Medication List         FLUoxetine 20 MG capsule  Commonly known as:  PROZAC  Take 20 mg by mouth 2 (two) times daily.     levothyroxine 150 MCG tablet  Commonly known as:  SYNTHROID, LEVOTHROID  Take 150 mcg by mouth daily before breakfast.     pantoprazole 40 MG tablet  Commonly known as:  PROTONIX  Take 1 tablet (40 mg total) by mouth daily.       Allergies  Allergen Reactions  . Abilify [Aripiprazole]     Lethargy, unable to function  . Amoxicillin-Pot Clavulanate Other (See Comments)    Stomach pains  . Cephalexin Other (See Comments)    Stomach pains   . Latex     Swelling, burning of skin .   Marland Kitchen Tape     Swelling, burning of skin .   Marland Kitchen Zicam Cold Remedy [Erysidoron #1] Nausea And Vomiting  . Vicodin [Hydrocodone-Acetaminophen] Hives and Rash       Follow-up Information   Follow up with JEGEDE, OLUGBEMIGA, MD In 2 weeks. (has appt in 2 weeks)    Specialty:  Internal Medicine   Contact information:   [address] [place] [address] [phone]        The results of significant diagnostics from this hospitalization (including imaging, microbiology, ancillary and laboratory) are listed below for reference.    Significant Diagnostic Studies: Dg Chest 2 View  11/16/2012   CLINICAL DATA:  Tachycardia.  EXAM: CHEST  2 VIEW  COMPARISON:  07/26/2009.  FINDINGS: The heart size and mediastinal contours are within normal limits. Both lungs are clear. The visualized skeletal structures are unremarkable.  IMPRESSION: No active  cardiopulmonary disease.   Electronically Signed   By: Loralie Champagne M.D.   On: 11/16/2012 12:55   Ct Abdomen Pelvis W Contrast  11/16/2012   CLINICAL DATA:  Epigastric pain. Nausea, vomiting, diarrhea.  EXAM: CT ABDOMEN AND PELVIS WITH CONTRAST  TECHNIQUE: Multidetector CT imaging of the abdomen and pelvis was performed using the standard protocol following bolus administration of intravenous contrast.  CONTRAST:  OMNIPAQUE IOHEXOL 300 MG/ML  SOLN  COMPARISON:  04/22/2011.  FINDINGS: Lung Bases: Clear.  Liver: Fatty infiltration adjacent to the falciform ligament of the liver.  Spleen:  Normal.  Gallbladder:  Normal. No calcified stones or inflammatory changes.  Common bile duct:  Normal.  Pancreas:  Normal.  Adrenal glands:  Normal bilaterally.  Kidneys: Normal renal enhancement. No stones, obstruction, or hydronephrosis.  Stomach:  Normal. No inflammatory changes.  Small bowel:  Normal. No mesenteric adenopathy. No obstruction.  Colon: There are no right lower quadrant inflammatory changes. Normal appearance of the appendix. Colon opacifies with contrast.  Pelvic Genitourinary: Superior to the urinary bladder, there is a 10.4 cm transverse by 8.4 cm AP cystic lesion with would appears to be displaced ovarian tissue to the left at the superior margin. This likely represents a large ovarian cyst. Longus cranial caudal MEASUREMENTS is 11.7 cm. The right ovary appears within normal limits. The uterus appears normal. Urinary bladder is mildly distended but appears normal.  Bones:  No aggressive osseous lesions.  Vasculature: Within normal limits. There is an accessory E left lower pole renal artery.  Body Wall: Normal.  IMPRESSION: 1. 11.7 cm long axis benign appearing cystic lesion in the anatomic pelvis, likely a left ovarian cyst. Compared to the prior exam, this appears little changed. Given the stability and no change in size/position, this is probably not related to the acute symptoms. It does not  appear that ultrasound evaluation has been performed and a followup ultrasound at 6-12 weeks is recommended to further evaluate. Ultrasound is a better modality to evaluate the characteristics of the cyst than CT. This recommendation follows ACR consensus guidelines: White Paper of the ACR Incidental Findings Committee II on Adnexal Findings. J Am Coll Radiol 2013:10:675-681. 2. No acute abdominal abnormality.   Electronically Signed   By: Andreas Newport M.D.   On: 11/16/2012 15:13    Microbiology: Recent Results (from the past 240 hour(s))  CLOSTRIDIUM DIFFICILE BY PCR     Status: None   Collection Time    11/17/12  8:56 AM      Result Value Range Status   C difficile by pcr NEGATIVE  NEGATIVE Final  CULTURE, BLOOD (ROUTINE X 2)     Status: None   Collection Time    11/17/12  9:30 AM      Result Value Range Status   Specimen Description BLOOD RIGHT ANTECUBITAL   Final   Special Requests BOTTLES DRAWN AEROBIC AND ANAEROBIC 10CC   Final  Culture  Setup Time     Final   Value: 11/17/2012 16:38     Performed at [facility]   Culture     Final   Value:        BLOOD CULTURE RECEIVED NO GROWTH TO DATE CULTURE WILL BE HELD FOR 5 DAYS BEFORE ISSUING A FINAL NEGATIVE REPORT     Performed at [facility]   Report Status PENDING   Incomplete  CULTURE, BLOOD (ROUTINE X 2)     Status: None   Collection Time    11/17/12  9:40 AM      Result Value Range Status   Specimen Description BLOOD HAND LEFT   Final   Special Requests BOTTLES DRAWN AEROBIC AND ANAEROBIC 5CC   Final   Culture  Setup Time     Final   Value: 11/17/2012 16:38     Performed at [facility]   Culture     Final   Value:        BLOOD CULTURE RECEIVED NO GROWTH TO DATE CULTURE WILL BE HELD FOR 5 DAYS BEFORE ISSUING A FINAL NEGATIVE REPORT     Performed at [facility]   Report Status PENDING   Incomplete     Labs: Basic Metabolic Panel:  Recent Labs Lab 11/16/12 0730  NA 138  K 3.4*  CL  102  CO2 21  GLUCOSE 119*  BUN 9  CREATININE 0.53  CALCIUM 9.1   Liver Function Tests:  Recent Labs Lab 11/16/12 1237  AST 31  ALT 11  ALKPHOS 67  BILITOT 0.5  PROT 6.7  ALBUMIN 3.1*    Recent Labs Lab 11/16/12 1237  LIPASE 28   No results found for this basename: AMMONIA,  in the last 168 hours CBC:  Recent Labs Lab 11/16/12 0730  WBC 16.9*  NEUTROABS 14.8*  HGB 12.2  HCT 37.9  MCV 82.9  PLT 408*   Cardiac Enzymes:  Recent Labs Lab 11/16/12 2010  TROPONINI <0.30   BNP: BNP (last 3 results) No results found for this basename: PROBNP,  in the last 8760 hours CBG: No results found for this basename: GLUCAP,  in the last 168 hours     Signed:  Jakyron Fabro  [facility] 11/18/2012, 10:14 AM

## 2012-11-23 LAB — CULTURE, BLOOD (ROUTINE X 2)
Culture: NO GROWTH
Culture: NO GROWTH

## 2012-12-01 ENCOUNTER — Ambulatory Visit: Payer: No Typology Code available for payment source | Attending: Internal Medicine | Admitting: Internal Medicine

## 2012-12-01 ENCOUNTER — Encounter: Payer: Self-pay | Admitting: Internal Medicine

## 2012-12-01 VITALS — BP 125/91 | HR 91 | Temp 99.4°F | Resp 14 | Ht 64.0 in | Wt 241.0 lb

## 2012-12-01 DIAGNOSIS — Z09 Encounter for follow-up examination after completed treatment for conditions other than malignant neoplasm: Secondary | ICD-10-CM | POA: Insufficient documentation

## 2012-12-01 DIAGNOSIS — F489 Nonpsychotic mental disorder, unspecified: Secondary | ICD-10-CM

## 2012-12-01 DIAGNOSIS — F99 Mental disorder, not otherwise specified: Secondary | ICD-10-CM

## 2012-12-01 DIAGNOSIS — E039 Hypothyroidism, unspecified: Secondary | ICD-10-CM

## 2012-12-01 DIAGNOSIS — F411 Generalized anxiety disorder: Secondary | ICD-10-CM

## 2012-12-01 NOTE — Progress Notes (Signed)
Pt is following up on the nausea and vomiting which has completley gone away. Pt has a cyst on her right ovary. That is needs to be removed.

## 2012-12-01 NOTE — Progress Notes (Signed)
Patient ID: MANAAL MANDALA, female   DOB: [birth year], 22 y.o.   MRN: [id number] Patient Demographics  Kimberly [name], is a 22 y.o. female  [id number]  [id number]  DOB - Aug 27, 1990  Chief Complaint  Patient presents with  . Follow-up        Subjective:   Kimberly [name] is a 22 y.o. female here today for a follow up visit. Patient was recently seen in the ER for abdominal pain nausea and vomiting, her mom says she was so dehydrated that her heart rate was high. She got better after rehydration was discharged to be followed up by primary care physician. She has history of multiple mental illness including anxiety and depression, she also has Stein-Leventhal syndrome and hypothyroidism. She is on fluoxetine, levothyroxine and pantoprazole. She has no major complaint today. She was to be referred to a psychiatrist. Patient has No headache, No chest pain, No abdominal pain - No Nausea, No new weakness tingling or numbness, No Cough - SOB.  ALLERGIES: Allergies  Allergen Reactions  . Abilify [Aripiprazole]     Lethargy, unable to function  . Amoxicillin-Pot Clavulanate Other (See Comments)    Stomach pains  . Cephalexin Other (See Comments)    Stomach pains   . Latex     Swelling, burning of skin .   Marland Kitchen Tape     Swelling, burning of skin .   Marland Kitchen Zicam Cold Remedy [Erysidoron #1] Nausea And Vomiting  . Vicodin [Hydrocodone-Acetaminophen] Hives and Rash    PAST MEDICAL HISTORY: Past Medical History  Diagnosis Date  . Gastric reflux   . Anxiety   . Depression   . Scoliosis   . 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 Complication of anesthesia     "takes alot to put me to sleep; woke up completely mid endoscopy" (11/16/2012)  . Asthma     "grew out of it" (11/16/2012)  . Iron deficiency anemia   . Migraines     "weekly" (11/16/2012)  . Schizophrenia     "moderate" (11/16/2012)  . Dysrhythmia     ST (11/16/2012)    MEDICATIONS AT HOME: Prior to  Admission medications   Medication Sig Start Date End Date Taking? Authorizing Provider  FLUoxetine (PROZAC) 20 MG capsule Take 20 mg by mouth 2 (two) times daily.   Yes Historical Provider, MD  levothyroxine (SYNTHROID, LEVOTHROID) 150 MCG tablet Take 150 mcg by mouth daily before breakfast.   Yes Historical Provider, MD  pantoprazole (PROTONIX) 40 MG tablet Take 1 tablet (40 mg total) by mouth daily. 05/26/12  Yes Richarda Overlie, MD     Objective:   Filed Vitals:   12/01/12 1551  BP: 125/91  Pulse: 91  Temp: 99.4 F (37.4 C)  TempSrc: Oral  Resp: 14  Height: 5\' 4"  (1.626 m)  Weight: 241 lb (109.317 kg)  SpO2: 97%    Exam General appearance : Awake, alert, not in any distress. Speech Clear. Not toxic looking, obese HEENT: Atraumatic and Normocephalic, pupils equally reactive to light and accomodation Neck: supple, no JVD. No cervical lymphadenopathy.  Chest:Good air entry bilaterally, no added sounds  CVS: S1 S2 regular, no murmurs.  Abdomen: Bowel sounds present, Non tender and not distended with no gaurding, rigidity or rebound. Extremities: B/L Lower Ext shows no edema, both legs are warm to touch Neurology: Awake alert, and oriented X 3, CN II-XII intact, Non focal Skin:No Rash Wounds:N/A   Data Review   CBC No results found for this  basename: WBC, HGB, HCT, PLT, MCV, MCH, MCHC, RDW, NEUTRABS, LYMPHSABS, MONOABS, EOSABS, BASOSABS, BANDABS, BANDSABD,  in the last 168 hours  Chemistries   No results found for this basename: NA, K, CL, CO2, GLUCOSE, BUN, CREATININE, GFRCGP, CALCIUM, MG, AST, ALT, ALKPHOS, BILITOT,  in the last 168 hours ------------------------------------------------------------------------------------------------------------------ No results found for this basename: HGBA1C,  in the last 72 hours ------------------------------------------------------------------------------------------------------------------ No results found for this basename: CHOL,  HDL, LDLCALC, TRIG, CHOLHDL, LDLDIRECT,  in the last 72 hours ------------------------------------------------------------------------------------------------------------------ No results found for this basename: TSH, T4TOTAL, FREET3, T3FREE, THYROIDAB,  in the last 72 hours ------------------------------------------------------------------------------------------------------------------ No results found for this basename: VITAMINB12, FOLATE, FERRITIN, TIBC, IRON, RETICCTPCT,  in the last 72 hours  Coagulation profile  No results found for this basename: INR, PROTIME,  in the last 168 hours    Assessment & Plan   Patient Active Problem List   Diagnosis Date Noted  . Acute gastroenteritis 11/16/2012  . Diarrhea 11/16/2012  . Tachycardia 11/16/2012  . 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 Hypothyroidism, acquired, autoimmune 03/01/2011  . Thyroiditis, autoimmune 03/01/2011  . Goiter 03/01/2011  . Acanthosis nigricans, acquired 03/01/2011  . Oligomenorrhea 03/01/2011  . Hirsutism 03/01/2011  . 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 Hypertension 03/01/2011  . GERD (gastroesophageal reflux disease) 03/01/2011  . Dyspepsia 03/01/2011  . Anxiety and depression 03/01/2011  . Pre-diabetes 07/03/2010  . Obesity 07/03/2010     Plan: Ambulatory referral to psychiatrist Referred to our social worker here in the clinic for counseling  Patient extensively counseled about nutrition and exercise  Health Maintenance -Vaccinations:  -Influenza already given  Follow up in 3 months or when necessary   The patient was given clear instructions to go to ER or return to medical center if symptoms don't improve, worsen or new problems develop. The patient verbalized understanding. The patient was told to call to get lab results if they haven't heard anything in the next week.    Jeanann Lewandowsky, MD, MHA, FACP, FAAP [facility]  [place], [place] [phone]   12/01/2012, 4:32 PM

## 2012-12-01 NOTE — Patient Instructions (Signed)

## 2012-12-09 ENCOUNTER — Other Ambulatory Visit: Payer: Self-pay | Admitting: Internal Medicine

## 2012-12-09 MED ORDER — FLUOXETINE HCL 20 MG PO CAPS: 20.0000 mg | ORAL_CAPSULE | Freq: Two times a day (BID) | ORAL | Status: DC

## 2013-02-14 DIAGNOSIS — Z8739 Personal history of other diseases of the musculoskeletal system and connective tissue: Secondary | ICD-10-CM | POA: Insufficient documentation

## 2013-02-14 DIAGNOSIS — F209 Schizophrenia, unspecified: Secondary | ICD-10-CM | POA: Insufficient documentation

## 2013-02-14 DIAGNOSIS — Y9389 Activity, other specified: Secondary | ICD-10-CM | POA: Insufficient documentation

## 2013-02-14 DIAGNOSIS — Z880 Allergy status to penicillin: Secondary | ICD-10-CM | POA: Insufficient documentation

## 2013-02-14 DIAGNOSIS — J45909 Unspecified asthma, uncomplicated: Secondary | ICD-10-CM | POA: Insufficient documentation

## 2013-02-14 DIAGNOSIS — T148 Other injury of unspecified body region: Secondary | ICD-10-CM | POA: Insufficient documentation

## 2013-02-14 DIAGNOSIS — Z91040 Latex allergy status: Secondary | ICD-10-CM | POA: Insufficient documentation

## 2013-02-14 DIAGNOSIS — E669 Obesity, unspecified: Secondary | ICD-10-CM | POA: Insufficient documentation

## 2013-02-14 DIAGNOSIS — F411 Generalized anxiety disorder: Secondary | ICD-10-CM | POA: Insufficient documentation

## 2013-02-14 DIAGNOSIS — Z862 Personal history of diseases of the blood and blood-forming organs and certain disorders involving the immune mechanism: Secondary | ICD-10-CM | POA: Insufficient documentation

## 2013-02-14 DIAGNOSIS — Z8679 Personal history of other diseases of the circulatory system: Secondary | ICD-10-CM | POA: Insufficient documentation

## 2013-02-14 DIAGNOSIS — W57XXXA Bitten or stung by nonvenomous insect and other nonvenomous arthropods, initial encounter: Principal | ICD-10-CM | POA: Insufficient documentation

## 2013-02-14 DIAGNOSIS — G43909 Migraine, unspecified, not intractable, without status migrainosus: Secondary | ICD-10-CM | POA: Insufficient documentation

## 2013-02-14 DIAGNOSIS — F3289 Other specified depressive episodes: Secondary | ICD-10-CM | POA: Insufficient documentation

## 2013-02-14 DIAGNOSIS — Z79899 Other long term (current) drug therapy: Secondary | ICD-10-CM | POA: Insufficient documentation

## 2013-02-14 DIAGNOSIS — F329 Major depressive disorder, single episode, unspecified: Secondary | ICD-10-CM | POA: Insufficient documentation

## 2013-02-14 DIAGNOSIS — Y929 Unspecified place or not applicable: Secondary | ICD-10-CM | POA: Insufficient documentation

## 2013-02-15 ENCOUNTER — Encounter (HOSPITAL_COMMUNITY): Payer: Self-pay | Admitting: Emergency Medicine

## 2013-02-15 ENCOUNTER — Emergency Department (HOSPITAL_COMMUNITY)
Admission: EM | Admit: 2013-02-15 | Discharge: 2013-02-15 | Disposition: A | Discharge status: Home / Self Care | Payer: Self-pay | Attending: Emergency Medicine | Admitting: Emergency Medicine

## 2013-02-15 DIAGNOSIS — W57XXXA Bitten or stung by nonvenomous insect and other nonvenomous arthropods, initial encounter: Secondary | ICD-10-CM

## 2013-02-15 MED ORDER — FAMOTIDINE 20 MG PO TABS: 20.0000 mg | ORAL_TABLET | Freq: Two times a day (BID) | ORAL | Status: DC

## 2013-02-15 MED ORDER — FAMOTIDINE 20 MG PO TABS
20.0000 mg | ORAL_TABLET | Freq: Once | ORAL | Status: AC
  Administered 2013-02-15: 20 mg via ORAL
  Filled 2013-02-15: qty 1

## 2013-02-15 NOTE — Discharge Instructions (Signed)
Bedbugs  Bedbugs are tiny bugs that live in and around beds. They come out at night and bite people lying in bed. Bedbug bites rarely cause a medical problem. The bites do cause red, itchy bumps.  HOME CARE  · Only take medicine as told by your doctor.  · Wear pajamas with long sleeves and pant legs.  · Call a pest control expert. You may need to throw away your mattress. Ask the pest control expert what you can do to keep the bedbugs from coming back. You may need to:  · Put a plastic cover over your mattress.  · Wash your clothes and bedding in hot water. Dry them in a hot dryer. The temperature should be hotter than 120° F (48.9° C).  · Vacuum all around your bed often.  · Check all used furniture, bedding, or clothes for bedbugs before you bring them into your house.  · Remove bird nests and bat perches around your home.  · After you travel, check your clothes and luggage for bedbugs before you bring them into your house. If you find any bedbugs, throw those items away.  GET HELP RIGHT AWAY IF:  · You have a fever.  · You have red bug bites that keep coming back.  · You have red bug bites that itch badly.  · You have bug bites that cause a skin rash.  · You have scratch marks that are red and sore.  MAKE SURE YOU:  · Understand these instructions.  · Will watch your condition.  · Will get help right away if you are not doing well or get worse.  Document Released: 04/22/2010 Document Revised: 03/30/2011 Document Reviewed: 04/22/2010  ExitCare® Patient Information ©2014 [employer].

## 2013-02-15 NOTE — ED Provider Notes (Signed)
CSN: [id number]     Arrival date & time 02/14/13  2358 History   First MD Initiated Contact with Patient 02/15/13 [phone]     Chief Complaint  Patient presents with  . Insect Bite   (Consider location/radiation/quality/duration/timing/severity/associated sxs/prior Treatment) HPI Comments: Patient states she slipped in a friend's house.  Last night, when she woke in the morning.  She had multiple red, itchy, appear to be bug bites, along her waistline backs of her arms, ankles  The history is provided by the patient.    Past Medical History  Diagnosis Date  . Gastric reflux   . Anxiety   . Depression   . Scoliosis   . 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 Complication of anesthesia     "takes alot to put me to sleep; woke up completely mid endoscopy" (11/16/2012)  . Asthma     "grew out of it" (11/16/2012)  . Iron deficiency anemia   . Migraines     "weekly" (11/16/2012)  . Schizophrenia     "moderate" (11/16/2012)  . Dysrhythmia     ST (11/16/2012)   Past Surgical History  Procedure Laterality Date  . Upper gi endoscopy  ~ 2006; ~2008   Family History  Problem Relation Age of Onset  . Thyroid disease Maternal Grandmother    History  Substance Use Topics  . Smoking status: Never Smoker   . Smokeless tobacco: Never Used  . Alcohol Use: No   OB History   Grav Para Term Preterm Abortions TAB SAB Ect Mult Living   0              Review of Systems  Respiratory: Negative for shortness of breath.   Skin: Positive for rash. Negative for wound.  All other systems reviewed and are negative.    Allergies  Abilify; Amoxicillin-pot clavulanate; Cephalexin; Latex; Tape; Zicam cold remedy; and Vicodin  Home Medications   Current Outpatient Rx  Name  Route  Sig  Dispense  Refill  . famotidine (PEPCID) 20 MG tablet   Oral   Take 1 tablet (20 mg total) by mouth 2 (two) times daily.   30 tablet   0   . FLUoxetine (PROZAC) 20 MG capsule   Oral  Take 1 capsule (20 mg total) by mouth 2 (two) times daily.   60 capsule   3   . levothyroxine (SYNTHROID, LEVOTHROID) 150 MCG tablet   Oral   Take 150 mcg by mouth daily before breakfast.         . pantoprazole (PROTONIX) 40 MG tablet   Oral   Take 1 tablet (40 mg total) by mouth daily.   30 tablet   2    BP 136/100  Pulse 118  Temp(Src) 98 F (36.7 C) (Oral)  Resp 16  Ht 5\' 4"  (1.626 m)  Wt 244 lb (110.678 kg)  BMI 41.86 kg/m2  SpO2 99%  LMP 01/24/2013 Physical Exam  Nursing note and vitals reviewed. Constitutional: She is oriented to person, place, and time. She appears well-developed and well-nourished.  obese  HENT:  Head: Normocephalic.  Eyes: Pupils are equal, round, and reactive to light.  Neck: Normal range of motion.  Cardiovascular: Normal rate and regular rhythm.   Pulmonary/Chest: Effort normal and breath sounds normal.  Musculoskeletal: Normal range of motion.  Neurological: She is alert and oriented to person, place, and time.  Skin: Rash noted.  Red raised, insect bite, along the posterior belt line, posterior arms, ankles,  posterior knees, and along the right side of the upper chest    ED Course  Procedures (including critical care time) Labs Review Labs Reviewed - No data to display Imaging Review No results found.  EKG Interpretation   None       MDM   1. Insect bite of multiple sites with local reaction    Is bites appear to be patent.  The right, history she's been given a prescription for Pepcid or symptom control, and that the extremities.  Instructions     Garald Balding, NP 02/15/13 0124

## 2013-02-15 NOTE — ED Notes (Signed)
Lissa Hoard NP notified that pt. is at Triage rm . 3.

## 2013-02-15 NOTE — ED Notes (Signed)
Pt. reports multiple insect bites at arms.legs and lower back onset last night .

## 2013-02-15 NOTE — ED Provider Notes (Signed)
Medical screening examination/treatment/procedure(s) were performed by non-physician practitioner and as supervising physician I was immediately available for consultation/collaboration.    Kalman Drape, MD 02/15/13 [phone]

## 2013-06-30 ENCOUNTER — Encounter (HOSPITAL_COMMUNITY): Payer: Self-pay | Admitting: Emergency Medicine

## 2013-06-30 ENCOUNTER — Emergency Department (INDEPENDENT_AMBULATORY_CARE_PROVIDER_SITE_OTHER)
Admission: EM | Admit: 2013-06-30 | Discharge: 2013-06-30 | Disposition: A | Discharge status: Home / Self Care | Payer: Self-pay | Source: Home / Self Care | Attending: Emergency Medicine | Admitting: Emergency Medicine

## 2013-06-30 DIAGNOSIS — K5289 Other specified noninfective gastroenteritis and colitis: Secondary | ICD-10-CM

## 2013-06-30 DIAGNOSIS — T148 Other injury of unspecified body region: Secondary | ICD-10-CM

## 2013-06-30 DIAGNOSIS — W57XXXA Bitten or stung by nonvenomous insect and other nonvenomous arthropods, initial encounter: Secondary | ICD-10-CM

## 2013-06-30 DIAGNOSIS — K529 Noninfective gastroenteritis and colitis, unspecified: Secondary | ICD-10-CM

## 2013-06-30 LAB — POCT I-STAT, CHEM 8
BUN: 5 mg/dL — ABNORMAL LOW (ref 6–23)
CALCIUM ION: 1.31 mmol/L — AB (ref 1.12–1.23)
Chloride: 102 mEq/L (ref 96–112)
Creatinine, Ser: 0.7 mg/dL (ref 0.50–1.10)
GLUCOSE: 98 mg/dL (ref 70–99)
HEMATOCRIT: 38 % (ref 36.0–46.0)
HEMOGLOBIN: 12.9 g/dL (ref 12.0–15.0)
Potassium: 3.7 mEq/L (ref 3.7–5.3)
Sodium: 143 mEq/L (ref 137–147)
TCO2: 24 mmol/L (ref 0–100)

## 2013-06-30 LAB — POCT URINALYSIS DIP (DEVICE)
Glucose, UA: NEGATIVE mg/dL
LEUKOCYTES UA: NEGATIVE
Nitrite: NEGATIVE
PROTEIN: 30 mg/dL — AB
Specific Gravity, Urine: >=1.03 (ref 1.005–1.030)
UROBILINOGEN UA: 2 mg/dL — AB (ref 0.0–1.0)
pH: 5.5 (ref 5.0–8.0)

## 2013-06-30 LAB — POCT PREGNANCY, URINE: PREG TEST UR: NEGATIVE

## 2013-06-30 MED ORDER — ONDANSETRON 4 MG PO TBDP
4.0000 mg | ORAL_TABLET | Freq: Once | ORAL | Status: AC
  Administered 2013-06-30: 4 mg via ORAL

## 2013-06-30 MED ORDER — ONDANSETRON HCL 4 MG PO TABS: 4.0000 mg | ORAL_TABLET | Freq: Three times a day (TID) | ORAL | Status: DC | PRN

## 2013-06-30 MED ORDER — DOXYCYCLINE HYCLATE 100 MG PO CAPS: 100.0000 mg | ORAL_CAPSULE | Freq: Two times a day (BID) | ORAL | Status: DC

## 2013-06-30 MED ORDER — ONDANSETRON 4 MG PO TBDP
ORAL_TABLET | ORAL | Status: AC
  Filled 2013-06-30: qty 1

## 2013-06-30 NOTE — ED Notes (Signed)
C/o tick bite on the back of her neck Patient has removed the tick and place him in a medication container Patient states she has been nausea, vomiting, and chills/sweats States she does work at a [employer], [employer].

## 2013-06-30 NOTE — ED Provider Notes (Signed)
CSN: [id number]     Arrival date & time 06/30/13  1621 History   First MD Initiated Contact with Patient 06/30/13 1805     Chief Complaint  Patient presents with  . Tick Removal   (Consider location/radiation/quality/duration/timing/severity/associated sxs/prior Treatment) HPI Comments: Patient states she began experiencing generalized malaise with chills, diaphoresis, nausea, vomiting and fatigue over the past 7 days. Discovered tick on the back of her neck while bathing yesterday. Tick has been removed, but patient continues to feel unwell. Also reports pruritic rash x 2-3 days at right flank and right thigh.   The history is provided by the patient.    Past Medical History  Diagnosis Date  . Gastric reflux   . Anxiety   . Depression   . Scoliosis   . 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 Complication of anesthesia     "takes alot to put me to sleep; woke up completely mid endoscopy" (11/16/2012)  . Asthma     "grew out of it" (11/16/2012)  . Iron deficiency anemia   . Migraines     "weekly" (11/16/2012)  . Schizophrenia     "moderate" (11/16/2012)  . Dysrhythmia     ST (11/16/2012)   Past Surgical History  Procedure Laterality Date  . Upper gi endoscopy  ~ 2006; ~2008   Family History  Problem Relation Age of Onset  . Thyroid disease Maternal Grandmother    History  Substance Use Topics  . Smoking status: Never Smoker   . Smokeless tobacco: Never Used  . Alcohol Use: No   OB History   Grav Para Term Preterm Abortions TAB SAB Ect Mult Living   0              Review of Systems  Constitutional: Positive for chills, diaphoresis, appetite change and fatigue.  HENT: Negative.   Eyes: Negative.   Respiratory: Negative.   Cardiovascular: Negative.   Gastrointestinal: Positive for nausea and vomiting. Negative for abdominal pain, diarrhea, constipation and blood in stool.  Endocrine: Negative for polydipsia, polyphagia and polyuria.   Genitourinary: Negative.   Musculoskeletal: Positive for myalgias. Negative for arthralgias, neck pain and neck stiffness.  Skin: Positive for rash.  Neurological: Negative.     Allergies  Abilify; Amoxicillin-pot clavulanate; Cephalexin; Latex; Tape; Zicam cold remedy; and Vicodin  Home Medications   Prior to Admission medications   Medication Sig Start Date End Date Taking? Authorizing Provider  doxycycline (VIBRAMYCIN) 100 MG capsule Take 1 capsule (100 mg total) by mouth 2 (two) times daily. 06/30/13   Lahoma Rocker, PA  famotidine (PEPCID) 20 MG tablet Take 1 tablet (20 mg total) by mouth 2 (two) times daily. 02/15/13   Garald Balding, NP  FLUoxetine (PROZAC) 20 MG capsule Take 1 capsule (20 mg total) by mouth 2 (two) times daily. 12/09/12   Angelica Chessman, MD  levothyroxine (SYNTHROID, LEVOTHROID) 150 MCG tablet Take 150 mcg by mouth daily before breakfast.    Historical Provider, MD  ondansetron (ZOFRAN) 4 MG tablet Take 1 tablet (4 mg total) by mouth every 8 (eight) hours as needed for nausea or vomiting. 06/30/13   Lahoma Rocker, PA  pantoprazole (PROTONIX) 40 MG tablet Take 1 tablet (40 mg total) by mouth daily. 05/26/12   Reyne Dumas, MD   BP 125/87  Pulse 96  Temp(Src) 99.4 F (37.4 C) (Oral)  Resp 12  SpO2 99%  LMP 06/10/2013 Physical Exam  Nursing note and vitals reviewed. Constitutional: She is  oriented to person, place, and time. She appears well-developed and well-nourished. No distress.  HENT:  Head: Normocephalic and atraumatic.  Eyes: Conjunctivae are normal. No scleral icterus.  Neck: Normal range of motion. Neck supple.  Cardiovascular: Normal rate, regular rhythm and normal heart sounds.   Pulmonary/Chest: Effort normal and breath sounds normal.  Abdominal: Soft. Bowel sounds are normal.  +obese  Musculoskeletal: Normal range of motion.  Neurological: She is alert and oriented to person, place, and time.  Skin: Skin is warm and dry.     ED Course  Procedures (including critical care time) Labs Review Labs Reviewed  POCT I-STAT, CHEM 8 - Abnormal; Notable for the following:    BUN 5 (*)    Calcium, Ion 1.31 (*)    All other components within normal limits  POCT URINALYSIS DIP (DEVICE) - Abnormal; Notable for the following:    Bilirubin Urine SMALL (*)    Ketones, ur TRACE (*)    Hgb urine dipstick TRACE (*)    Protein, ur 30 (*)    Urobilinogen, UA 2.0 (*)    All other components within normal limits  ROCKY MTN SPOTTED FVR AB, IGM-BLOOD  POCT PREGNANCY, URINE    Imaging Review No results found.   MDM   1. Gastroenteritis   2. Tick bite    UPT negative Istat 8 grossly normal.  Tolerating oral fluids following 4mg  ODT zofran given in clinic. Labs sent for RMSF Will initiate empiric treatment for tick bourne illness and provide Rx for zofran of at home use. Follow up with symptoms persist despite above therapies.    [place], [place] 06/30/13 2050

## 2013-06-30 NOTE — Discharge Instructions (Signed)
Your blood chemistry was normal and your urine studies were normal with the exception of your being a bit dehydrated. Urine pregnancy was negative Your blood work has been sent for testing for [facility] Fever (a tick bourne illness) and you should begin taking doxycycline as prescribed. Use Zofran as directed for nausea and vomiting.  Tick Bite Information Ticks are insects that attach themselves to the skin and draw blood for food. There are various types of ticks. Common types include wood ticks and deer ticks. Most ticks live in shrubs and grassy areas. Ticks can climb onto your body when you make contact with leaves or grass where the tick is waiting. The most common places on the body for ticks to attach themselves are the scalp, neck, armpits, waist, and groin. Most tick bites are harmless, but sometimes ticks carry germs that cause diseases. These germs can be spread to a person during the tick's feeding process. The chance of a disease spreading through a tick bite depends on:   The type of tick.  Time of year.   How long the tick is attached.   Geographic location.  HOW CAN YOU PREVENT TICK BITES? Take these steps to help prevent tick bites when you are outdoors:  Wear protective clothing. Long sleeves and long pants are best.   Wear white clothes so you can see ticks more easily.  Tuck your pant legs into your socks.   If walking on a trail, stay in the middle of the trail to avoid brushing against bushes.  Avoid walking through areas with long grass.  Put insect repellent on all exposed skin and along boot tops, pant legs, and sleeve cuffs.   Check clothing, hair, and skin repeatedly and before going inside.   Brush off any ticks that are not attached.  Take a shower or bath as soon as possible after being outdoors.  WHAT IS THE PROPER WAY TO REMOVE A TICK? Ticks should be removed as soon as possible to help prevent diseases caused by tick  bites. 1. If latex gloves are available, put them on before trying to remove a tick.  2. Using fine-point tweezers, grasp the tick as close to the skin as possible. You may also use curved forceps or a tick removal tool. Grasp the tick as close to its head as possible. Avoid grasping the tick on its body. 3. Pull gently with steady upward pressure until the tick lets go. Do not twist the tick or jerk it suddenly. This may break off the tick's head or mouth parts. 4. Do not squeeze or crush the tick's body. This could force disease-carrying fluids from the tick into your body.  5. After the tick is removed, wash the bite area and your hands with soap and water or other disinfectant such as alcohol. 6. Apply a small amount of antiseptic cream or ointment to the bite site.  7. Wash and disinfect any instruments that were used.  Do not try to remove a tick by applying a hot match, petroleum jelly, or fingernail polish to the tick. These methods do not work and may increase the chances of disease being spread from the tick bite.  WHEN SHOULD YOU SEEK MEDICAL CARE? Contact your health care provider if you are unable to remove a tick from your skin or if a part of the tick breaks off and is stuck in the skin.  After a tick bite, you need to be aware of signs and symptoms  that could be related to diseases spread by ticks. Contact your health care provider if you develop any of the following in the days or weeks after the tick bite:  Unexplained fever.  Rash. A circular rash that appears days or weeks after the tick bite may indicate the possibility of Lyme disease. The rash may resemble a target with a bull's-eye and may occur at a different part of your body than the tick bite.  Redness and swelling in the area of the tick bite.   Tender, swollen lymph glands.   Diarrhea.   Weight loss.   Cough.   Fatigue.   Muscle, joint, or bone pain.   Abdominal pain.   Headache.    Lethargy or a change in your level of consciousness.  Difficulty walking or moving your legs.   Numbness in the legs.   Paralysis.  Shortness of breath.   Confusion.   Repeated vomiting.  Document Released: 01/03/2000 Document Revised: 10/26/2012 Document Reviewed: 06/15/2012 [facility] Patient Information 2014 [place].

## 2013-06-30 NOTE — ED Provider Notes (Signed)
Medical screening examination/treatment/procedure(s) were performed by non-physician practitioner and as supervising physician I was immediately available for consultation/collaboration.  Philipp Deputy, M.D.  Harden Mo, MD 06/30/13 2220

## 2013-07-03 LAB — ROCKY MTN SPOTTED FVR AB, IGM-BLOOD: RMSF IGM: 0.1 IV (ref 0.00–0.89)

## 2013-07-04 ENCOUNTER — Encounter (HOSPITAL_COMMUNITY): Payer: Self-pay | Admitting: Emergency Medicine

## 2013-07-04 ENCOUNTER — Emergency Department (HOSPITAL_COMMUNITY)
Admission: EM | Admit: 2013-07-04 | Discharge: 2013-07-04 | Disposition: A | Discharge status: Home / Self Care | Payer: Self-pay | Attending: Emergency Medicine | Admitting: Emergency Medicine

## 2013-07-04 DIAGNOSIS — R112 Nausea with vomiting, unspecified: Secondary | ICD-10-CM | POA: Insufficient documentation

## 2013-07-04 DIAGNOSIS — M6281 Muscle weakness (generalized): Secondary | ICD-10-CM | POA: Insufficient documentation

## 2013-07-04 DIAGNOSIS — N898 Other specified noninflammatory disorders of vagina: Secondary | ICD-10-CM | POA: Insufficient documentation

## 2013-07-04 DIAGNOSIS — K219 Gastro-esophageal reflux disease without esophagitis: Secondary | ICD-10-CM | POA: Insufficient documentation

## 2013-07-04 DIAGNOSIS — N949 Unspecified condition associated with female genital organs and menstrual cycle: Secondary | ICD-10-CM | POA: Insufficient documentation

## 2013-07-04 DIAGNOSIS — R21 Rash and other nonspecific skin eruption: Secondary | ICD-10-CM | POA: Insufficient documentation

## 2013-07-04 DIAGNOSIS — Z91040 Latex allergy status: Secondary | ICD-10-CM | POA: Insufficient documentation

## 2013-07-04 DIAGNOSIS — E071 Dyshormogenetic goiter: Secondary | ICD-10-CM | POA: Insufficient documentation

## 2013-07-04 DIAGNOSIS — F329 Major depressive disorder, single episode, unspecified: Secondary | ICD-10-CM | POA: Insufficient documentation

## 2013-07-04 DIAGNOSIS — R531 Weakness: Secondary | ICD-10-CM

## 2013-07-04 DIAGNOSIS — J45909 Unspecified asthma, uncomplicated: Secondary | ICD-10-CM | POA: Insufficient documentation

## 2013-07-04 DIAGNOSIS — D649 Anemia, unspecified: Secondary | ICD-10-CM | POA: Insufficient documentation

## 2013-07-04 DIAGNOSIS — E876 Hypokalemia: Secondary | ICD-10-CM | POA: Insufficient documentation

## 2013-07-04 DIAGNOSIS — F411 Generalized anxiety disorder: Secondary | ICD-10-CM | POA: Insufficient documentation

## 2013-07-04 DIAGNOSIS — IMO0002 Reserved for concepts with insufficient information to code with codable children: Secondary | ICD-10-CM | POA: Insufficient documentation

## 2013-07-04 DIAGNOSIS — Z3202 Encounter for pregnancy test, result negative: Secondary | ICD-10-CM | POA: Insufficient documentation

## 2013-07-04 DIAGNOSIS — F3289 Other specified depressive episodes: Secondary | ICD-10-CM | POA: Insufficient documentation

## 2013-07-04 DIAGNOSIS — Z792 Long term (current) use of antibiotics: Secondary | ICD-10-CM | POA: Insufficient documentation

## 2013-07-04 DIAGNOSIS — G43909 Migraine, unspecified, not intractable, without status migrainosus: Secondary | ICD-10-CM | POA: Insufficient documentation

## 2013-07-04 DIAGNOSIS — Z79899 Other long term (current) drug therapy: Secondary | ICD-10-CM | POA: Insufficient documentation

## 2013-07-04 DIAGNOSIS — Z760 Encounter for issue of repeat prescription: Secondary | ICD-10-CM | POA: Insufficient documentation

## 2013-07-04 DIAGNOSIS — Z880 Allergy status to penicillin: Secondary | ICD-10-CM | POA: Insufficient documentation

## 2013-07-04 LAB — CBC WITH DIFFERENTIAL/PLATELET
BASOS ABS: 0 10*3/uL (ref 0.0–0.1)
Basophils Relative: 0 % (ref 0–1)
Eosinophils Absolute: 0.2 10*3/uL (ref 0.0–0.7)
Eosinophils Relative: 1 % (ref 0–5)
HCT: 32.5 % — ABNORMAL LOW (ref 36.0–46.0)
Hemoglobin: 9.9 g/dL — ABNORMAL LOW (ref 12.0–15.0)
Lymphocytes Relative: 24 % (ref 12–46)
Lymphs Abs: 2.9 10*3/uL (ref 0.7–4.0)
MCH: 24.5 pg — ABNORMAL LOW (ref 26.0–34.0)
MCHC: 30.5 g/dL (ref 30.0–36.0)
MCV: 80.4 fL (ref 78.0–100.0)
Monocytes Absolute: 0.7 10*3/uL (ref 0.1–1.0)
Monocytes Relative: 6 % (ref 3–12)
NEUTROS ABS: 8.1 10*3/uL — AB (ref 1.7–7.7)
NEUTROS PCT: 69 % (ref 43–77)
PLATELETS: 404 10*3/uL — AB (ref 150–400)
RBC: 4.04 MIL/uL (ref 3.87–5.11)
RDW: 15.4 % (ref 11.5–15.5)
WBC: 11.8 10*3/uL — ABNORMAL HIGH (ref 4.0–10.5)

## 2013-07-04 LAB — LIPASE, BLOOD: Lipase: 22 U/L (ref 11–59)

## 2013-07-04 LAB — COMPREHENSIVE METABOLIC PANEL
ALBUMIN: 3.6 g/dL (ref 3.5–5.2)
ALT: 17 U/L (ref 0–35)
AST: 16 U/L (ref 0–37)
Alkaline Phosphatase: 93 U/L (ref 39–117)
BILIRUBIN TOTAL: 0.6 mg/dL (ref 0.3–1.2)
BUN: 5 mg/dL — ABNORMAL LOW (ref 6–23)
CHLORIDE: 98 meq/L (ref 96–112)
CO2: 23 mEq/L (ref 19–32)
Calcium: 9.2 mg/dL (ref 8.4–10.5)
Creatinine, Ser: 0.62 mg/dL (ref 0.50–1.10)
GFR calc Af Amer: >90 mL/min (ref >90)
GFR calc non Af Amer: >90 mL/min (ref >90)
Glucose, Bld: 86 mg/dL (ref 70–99)
POTASSIUM: 3.1 meq/L — AB (ref 3.7–5.3)
Sodium: 139 mEq/L (ref 137–147)
Total Protein: 7.4 g/dL (ref 6.0–8.3)

## 2013-07-04 LAB — WET PREP, GENITAL
Clue Cells Wet Prep HPF POC: NONE SEEN
Trich, Wet Prep: NONE SEEN
Yeast Wet Prep HPF POC: NONE SEEN

## 2013-07-04 LAB — POC OCCULT BLOOD, ED: FECAL OCCULT BLD: NEGATIVE

## 2013-07-04 LAB — URINE MICROSCOPIC-ADD ON

## 2013-07-04 LAB — URINALYSIS, ROUTINE W REFLEX MICROSCOPIC
GLUCOSE, UA: NEGATIVE mg/dL
HGB URINE DIPSTICK: NEGATIVE
KETONES UR: 15 mg/dL — AB
Nitrite: NEGATIVE
Protein, ur: 30 mg/dL — AB
Specific Gravity, Urine: 1.021 (ref 1.005–1.030)
Urobilinogen, UA: 1 mg/dL (ref 0.0–1.0)
pH: 6 (ref 5.0–8.0)

## 2013-07-04 LAB — SEDIMENTATION RATE: SED RATE: 30 mm/h — AB (ref 0–22)

## 2013-07-04 LAB — TSH: TSH: 1.15 u[IU]/mL (ref 0.350–4.500)

## 2013-07-04 LAB — PREGNANCY, URINE: Preg Test, Ur: NEGATIVE

## 2013-07-04 MED ORDER — LEVOTHYROXINE SODIUM 150 MCG PO TABS: 150.0000 ug | ORAL_TABLET | Freq: Every day | ORAL | Status: DC

## 2013-07-04 MED ORDER — FLUOXETINE HCL 20 MG PO CAPS: 20.0000 mg | ORAL_CAPSULE | Freq: Two times a day (BID) | ORAL | Status: DC

## 2013-07-04 MED ORDER — POTASSIUM CHLORIDE CRYS ER 20 MEQ PO TBCR
40.0000 meq | EXTENDED_RELEASE_TABLET | Freq: Two times a day (BID) | ORAL | Status: DC
  Administered 2013-07-04: 40 meq via ORAL
  Filled 2013-07-04: qty 2

## 2013-07-04 MED ORDER — ONDANSETRON HCL 4 MG PO TABS: 4.0000 mg | ORAL_TABLET | Freq: Three times a day (TID) | ORAL | Status: DC | PRN

## 2013-07-04 MED ORDER — TRIAMCINOLONE ACETONIDE 0.1 % EX CREA: 1.0000 "application " | TOPICAL_CREAM | Freq: Two times a day (BID) | CUTANEOUS | Status: DC

## 2013-07-04 MED ORDER — SODIUM CHLORIDE 0.9 % IV BOLUS (SEPSIS)
1000.0000 mL | INTRAVENOUS | Status: AC
  Administered 2013-07-04: 1000 mL via INTRAVENOUS

## 2013-07-04 MED ORDER — ONDANSETRON HCL 4 MG/2ML IJ SOLN
4.0000 mg | Freq: Once | INTRAMUSCULAR | Status: AC
  Administered 2013-07-04: 4 mg via INTRAVENOUS
  Filled 2013-07-04: qty 2

## 2013-07-04 NOTE — ED Notes (Addendum)
Checked on wet-prep with lab: lab is just now finishing up with specimen, should be ready any time now.

## 2013-07-04 NOTE — ED Notes (Signed)
Hilda Blades, PA-C at bedside.

## 2013-07-04 NOTE — ED Provider Notes (Signed)
CSN: [id number]     Arrival date & time 07/04/13  0012 History   First MD Initiated Contact with Patient 07/04/13 [phone]     No chief complaint on file.    (Consider location/radiation/quality/duration/timing/severity/associated sxs/prior Treatment) HPI Pt is a 23yo female presenting to ED c/o persistent nausea, vomiting, generalized weakness and rash that has been intermittent since Friday, 6/12.  Pt states she was seen at urgent care where they removed a tick from her neck and stated pt on Doxycycline, test for RMSF came back negative.  Pt reports vomiting has improved, last episode was yesterday. Reports 3-4 episodes of vomiting w/o blood or bile in emesis. Denies diarrhea. Denies urinary or vaginal symptoms. States she does have heavy menstrual cycles, hx of iron deficiency anemia. LMP 06/10/13.  Pt also reports having hypothyroidism and is concerns her symptoms may be due to this because she states she has been taking less of her synthroid, trying to make it last until she can get her insurance and find a PCP.    Past Medical History  Diagnosis Date  . Gastric reflux   . Anxiety   . Depression   . Scoliosis   . 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 Complication of anesthesia     "takes alot to put me to sleep; woke up completely mid endoscopy" (11/16/2012)  . Asthma     "grew out of it" (11/16/2012)  . Iron deficiency anemia   . Migraines     "weekly" (11/16/2012)  . Schizophrenia     "moderate" (11/16/2012)  . Dysrhythmia     ST (11/16/2012)   Past Surgical History  Procedure Laterality Date  . Upper gi endoscopy  ~ 2006; ~2008   Family History  Problem Relation Age of Onset  . Thyroid disease Maternal Grandmother    History  Substance Use Topics  . Smoking status: Never Smoker   . Smokeless tobacco: Never Used  . Alcohol Use: No   OB History   Grav Para Term Preterm Abortions TAB SAB Ect Mult Living   0              Review of Systems   Constitutional: Positive for fatigue. Negative for fever and chills.  Respiratory: Negative for cough, shortness of breath and wheezing.   Cardiovascular: Negative for chest pain and palpitations.  Gastrointestinal: Positive for nausea and vomiting. Negative for abdominal pain, diarrhea and blood in stool.  Genitourinary: Positive for vaginal bleeding and pelvic pain. Negative for dysuria, urgency, frequency, hematuria, flank pain, vaginal discharge and vaginal pain.  Skin: Positive for rash. Negative for color change, pallor and wound.  Neurological: Positive for weakness. Negative for dizziness, syncope and light-headedness.  All other systems reviewed and are negative.     Allergies  Abilify; Amoxicillin-pot clavulanate; Cephalexin; Latex; Tape; Zicam cold remedy; and Vicodin  Home Medications   Prior to Admission medications   Medication Sig Start Date End Date Taking? Authorizing Provider  doxycycline (VIBRAMYCIN) 100 MG capsule Take 1 capsule (100 mg total) by mouth 2 (two) times daily. 06/30/13  Yes Annett Gula Presson, PA  pantoprazole (PROTONIX) 40 MG tablet Take 1 tablet (40 mg total) by mouth daily. 05/26/12  Yes Reyne Dumas, MD  FLUoxetine (PROZAC) 20 MG capsule Take 1 capsule (20 mg total) by mouth 2 (two) times daily. 07/04/13   Noland Fordyce, PA-C  levothyroxine (SYNTHROID, LEVOTHROID) 150 MCG tablet Take 1 tablet (150 mcg total) by mouth daily before breakfast. 07/04/13  Noland Fordyce, PA-C  ondansetron (ZOFRAN) 4 MG tablet Take 1 tablet (4 mg total) by mouth every 8 (eight) hours as needed for nausea or vomiting. 07/04/13   Noland Fordyce, PA-C  triamcinolone cream (KENALOG) 0.1 % Apply 1 application topically 2 (two) times daily. 07/04/13   Noland Fordyce, PA-C   BP 124/79  Pulse 87  Temp(Src) 99.3 F (37.4 C) (Rectal)  Resp 14  Ht 5\' 4"  (1.626 m)  Wt 239 lb (108.41 kg)  BMI 41.00 kg/m2  SpO2 100%  LMP 06/10/2013 Physical Exam  Nursing note and vitals  reviewed. Constitutional: She appears well-developed and well-nourished. No distress.  Pt lying on exam bed, appears well. Non-toxic. NAD.  HENT:  Head: Normocephalic and atraumatic.  Eyes: Conjunctivae are normal. No scleral icterus.  Neck: Normal range of motion. Neck supple.  No nuchal rigidity or meningeal signs.  Cardiovascular: Normal rate, regular rhythm and normal heart sounds.   Pulmonary/Chest: Effort normal and breath sounds normal. No respiratory distress. She has no wheezes. She has no rales. She exhibits no tenderness.  Abdominal: Soft. Bowel sounds are normal. She exhibits no distension and no mass. There is no tenderness. There is no rebound and no guarding.  Soft, non-distended, non-tender. No CVAT  Genitourinary: Uterus normal. Pelvic exam was performed with patient supine. No labial fusion. There is no rash, tenderness, lesion or injury on the right labia. There is no rash, tenderness, lesion or injury on the left labia. Cervix exhibits discharge. Cervix exhibits no motion tenderness and no friability. Right adnexum displays no mass, no tenderness and no fullness. Left adnexum displays no mass, no tenderness and no fullness. No erythema, tenderness or bleeding around the vagina. No foreign body around the vagina. No signs of injury around the vagina. Vaginal discharge found.  Chaperoned exam.  Small amount of white-clear discharge in vaginal canal and from cervical os. Cervical os closed. No CMT, adnexal tenderness or masses.   Musculoskeletal: Normal range of motion.  Neurological: She is alert.  Skin: Skin is warm and dry. Rash noted. She is not diaphoretic.  Erythematous, excoriated rash on right upper back over shoulder blade and right mid-back around T7-T8 dermatome.  No red streaking, ecchymosis, bleeding or discharge. No induration. Mild tenderness.     ED Course  Procedures (including critical care time) Labs Review Labs Reviewed  WET PREP, GENITAL - Abnormal;  Notable for the following:    WBC, Wet Prep HPF POC FEW (*)    All other components within normal limits  CBC WITH DIFFERENTIAL - Abnormal; Notable for the following:    WBC 11.8 (*)    Hemoglobin 9.9 (*)    HCT 32.5 (*)    MCH 24.5 (*)    Platelets 404 (*)    Neutro Abs 8.1 (*)    All other components within normal limits  COMPREHENSIVE METABOLIC PANEL - Abnormal; Notable for the following:    Potassium 3.1 (*)    BUN 5 (*)    All other components within normal limits  SEDIMENTATION RATE - Abnormal; Notable for the following:    Sed Rate 30 (*)    All other components within normal limits  URINALYSIS, ROUTINE W REFLEX MICROSCOPIC - Abnormal; Notable for the following:    Color, Urine AMBER (*)    APPearance CLOUDY (*)    Bilirubin Urine SMALL (*)    Ketones, ur 15 (*)    Protein, ur 30 (*)    Leukocytes, UA SMALL (*)  All other components within normal limits  URINE MICROSCOPIC-ADD ON - Abnormal; Notable for the following:    Squamous Epithelial / LPF MANY (*)    Bacteria, UA MANY (*)    All other components within normal limits  URINE CULTURE  GC/CHLAMYDIA PROBE AMP  LIPASE, BLOOD  TSH  PREGNANCY, URINE  OCCULT BLOOD X 1 CARD TO LAB, STOOL  POC OCCULT BLOOD, ED    Imaging Review No results found.   EKG Interpretation None      MDM   Final diagnoses:  Nausea & vomiting  Rash  Weakness  Anemia  Hypokalemia  Medication refill    Pt is a 23yo female presenting with nausea, vomiting, generalized weakness and intermittent rash. Being tx with Doxycycline for possible tick borne illness. RMSF-negative.  Vitals: unremarkable. Pt appears well, non-toxic.  Labs- CBC- low Hgb, likely due to heavy menstrual cycle. Hemoccult-negative. CMP- mild hypokalemia, 3.1.  Sed Rate: unremarkable TSH: WNL. UA-unremarkable. Urine preg: negative Wet prep: unremarkable.     Pt does have rash on right back, no evidence of underlying infection. Will tx with triamcinolone as pt  reports pruritic rash.  Discussed pt with Dr. Christy Gentles, do not believe emergent process taking place at this time.  IV fluids and zofran given, no vomiting in ED.  Advised pt will refill home medications, f/u with [facility]. Also advised to keep taking doxycycline as prescribed. Return precautions provided. Pt verbalized understanding and agreement with tx plan.      Noland Fordyce, PA-C 07/04/13 [phone]

## 2013-07-04 NOTE — ED Provider Notes (Signed)
Patient seen/examined in the Emergency Department in conjunction with [facility] Patient reports rash and vomiting Exam : awake/alert, small area of rash that could be c/w zoster.  Pt otherwise well appearing Plan: continue treatment with doxycycline for recent tick bite Would not start antivirals at this time as area of rash is small and pt denies pain  Advised to take iron supplement (pt with anemia likely due to heavy vag bleeding)  Sharyon Cable, MD 07/04/13 [phone]

## 2013-07-04 NOTE — ED Notes (Signed)
Pt. reports persistent nausea and vomitting since Friday last week , seen here last week blood tests done prescribed with Doxycycline at discharge .

## 2013-07-05 LAB — URINE CULTURE
CULTURE: NO GROWTH
Colony Count: NO GROWTH

## 2013-07-05 LAB — GC/CHLAMYDIA PROBE AMP
CT Probe RNA: NEGATIVE
GC Probe RNA: NEGATIVE

## 2013-07-05 NOTE — ED Provider Notes (Signed)
Medical screening examination/treatment/procedure(s) were conducted as a shared visit with non-physician practitioner(s) and myself.  I personally evaluated the patient during the encounter.   EKG Interpretation None        Sharyon Cable, MD 07/05/13 2022

## 2013-08-02 ENCOUNTER — Ambulatory Visit: Payer: Self-pay | Attending: Internal Medicine

## 2013-08-07 ENCOUNTER — Encounter: Payer: Self-pay | Admitting: Internal Medicine

## 2013-08-07 ENCOUNTER — Ambulatory Visit: Payer: PRIVATE HEALTH INSURANCE

## 2013-08-07 ENCOUNTER — Ambulatory Visit: Payer: PRIVATE HEALTH INSURANCE | Attending: Internal Medicine | Admitting: Internal Medicine

## 2013-08-07 VITALS — BP 99/68 | HR 88 | Temp 98.4°F | Resp 16 | Ht 64.0 in | Wt 239.0 lb

## 2013-08-07 DIAGNOSIS — F329 Major depressive disorder, single episode, unspecified: Secondary | ICD-10-CM | POA: Insufficient documentation

## 2013-08-07 DIAGNOSIS — S4992XA Unspecified injury of left shoulder and upper arm, initial encounter: Secondary | ICD-10-CM

## 2013-08-07 DIAGNOSIS — F3289 Other specified depressive episodes: Secondary | ICD-10-CM | POA: Insufficient documentation

## 2013-08-07 DIAGNOSIS — K219 Gastro-esophageal reflux disease without esophagitis: Secondary | ICD-10-CM | POA: Insufficient documentation

## 2013-08-07 DIAGNOSIS — S4980XA Other specified injuries of shoulder and upper arm, unspecified arm, initial encounter: Secondary | ICD-10-CM

## 2013-08-07 DIAGNOSIS — M4120 Other idiopathic scoliosis, site unspecified: Secondary | ICD-10-CM | POA: Insufficient documentation

## 2013-08-07 DIAGNOSIS — Z91040 Latex allergy status: Secondary | ICD-10-CM | POA: Insufficient documentation

## 2013-08-07 DIAGNOSIS — Z885 Allergy status to narcotic agent status: Secondary | ICD-10-CM | POA: Insufficient documentation

## 2013-08-07 DIAGNOSIS — Z87891 Personal history of nicotine dependence: Secondary | ICD-10-CM | POA: Insufficient documentation

## 2013-08-07 DIAGNOSIS — Z881 Allergy status to other antibiotic agents status: Secondary | ICD-10-CM | POA: Insufficient documentation

## 2013-08-07 DIAGNOSIS — E039 Hypothyroidism, unspecified: Secondary | ICD-10-CM

## 2013-08-07 DIAGNOSIS — Z880 Allergy status to penicillin: Secondary | ICD-10-CM | POA: Insufficient documentation

## 2013-08-07 DIAGNOSIS — E876 Hypokalemia: Secondary | ICD-10-CM

## 2013-08-07 DIAGNOSIS — F411 Generalized anxiety disorder: Secondary | ICD-10-CM | POA: Insufficient documentation

## 2013-08-07 DIAGNOSIS — S46909A Unspecified injury of unspecified muscle, fascia and tendon at shoulder and upper arm level, unspecified arm, initial encounter: Secondary | ICD-10-CM

## 2013-08-07 LAB — COMPLETE METABOLIC PANEL WITH GFR
ALT: 17 U/L (ref 0–35)
AST: 15 U/L (ref 0–37)
Albumin: 4 g/dL (ref 3.5–5.2)
Alkaline Phosphatase: 91 U/L (ref 39–117)
BILIRUBIN TOTAL: 0.6 mg/dL (ref 0.2–1.2)
BUN: 7 mg/dL (ref 6–23)
CO2: 25 mEq/L (ref 19–32)
CREATININE: 0.59 mg/dL (ref 0.50–1.10)
Calcium: 9.4 mg/dL (ref 8.4–10.5)
Chloride: 105 mEq/L (ref 96–112)
GFR, Est African American: >89 mL/min
GLUCOSE: 83 mg/dL (ref 70–99)
Potassium: 4.4 mEq/L (ref 3.5–5.3)
Sodium: 138 mEq/L (ref 135–145)
Total Protein: 7 g/dL (ref 6.0–8.3)

## 2013-08-07 LAB — LIPID PANEL
Cholesterol: 185 mg/dL (ref 0–200)
HDL: 42 mg/dL (ref >39)
LDL Cholesterol: 114 mg/dL — ABNORMAL HIGH (ref 0–99)
TRIGLYCERIDES: 146 mg/dL (ref <150)
Total CHOL/HDL Ratio: 4.4 Ratio
VLDL: 29 mg/dL (ref 0–40)

## 2013-08-07 MED ORDER — LEVOTHYROXINE SODIUM 150 MCG PO TABS: 150.0000 ug | ORAL_TABLET | Freq: Every day | ORAL | Status: DC

## 2013-08-07 MED ORDER — DICLOFENAC SODIUM 1 % TD GEL: 2.0000 g | Freq: Four times a day (QID) | TRANSDERMAL | Status: DC

## 2013-08-07 MED ORDER — PANTOPRAZOLE SODIUM 40 MG PO TBEC: 40.0000 mg | DELAYED_RELEASE_TABLET | Freq: Every day | ORAL | Status: DC

## 2013-08-07 NOTE — Patient Instructions (Signed)
Hypothyroidism The thyroid is a large gland located in the lower front of your neck. The thyroid gland helps control metabolism. Metabolism is how your body handles food. It controls metabolism with the hormone thyroxine. When this gland is underactive (hypothyroid), it produces too little hormone.  CAUSES These include:   Absence or destruction of thyroid tissue.  Goiter due to iodine deficiency.  Goiter due to medications.  Congenital defects (since birth).  Problems with the pituitary. This causes a lack of TSH (thyroid stimulating hormone). This hormone tells the thyroid to turn out more hormone. SYMPTOMS  Lethargy (feeling as though you have no energy)  Cold intolerance  Weight gain (in spite of normal food intake)  Dry skin  Coarse hair  Menstrual irregularity (if severe, may lead to infertility)  Slowing of thought processes Cardiac problems are also caused by insufficient amounts of thyroid hormone. Hypothyroidism in the newborn is cretinism, and is an extreme form. It is important that this form be treated adequately and immediately or it will lead rapidly to retarded physical and mental development. DIAGNOSIS  To prove hypothyroidism, your caregiver may do blood tests and ultrasound tests. Sometimes the signs are hidden. It may be necessary for your caregiver to watch this illness with blood tests either before or after diagnosis and treatment. TREATMENT  Low levels of thyroid hormone are increased by using synthetic thyroid hormone. This is a safe, effective treatment. It usually takes about four weeks to gain the full effects of the medication. After you have the full effect of the medication, it will generally take another four weeks for problems to leave. Your caregiver may start you on low doses. If you have had heart problems the dose may be gradually increased. It is generally not an emergency to get rapidly to normal. HOME CARE INSTRUCTIONS   Take your  medications as your caregiver suggests. Let your caregiver know of any medications you are taking or start taking. Your caregiver will help you with dosage schedules.  As your condition improves, your dosage needs may increase. It will be necessary to have continuing blood tests as suggested by your caregiver.  Report all suspected medication side effects to your caregiver. SEEK MEDICAL CARE IF: Seek medical care if you develop:  Sweating.  Tremulousness (tremors).  Anxiety.  Rapid weight loss.  Heat intolerance.  Emotional swings.  Diarrhea.  Weakness. SEEK IMMEDIATE MEDICAL CARE IF:  You develop chest pain, an irregular heart beat (palpitations), or a rapid heart beat. MAKE SURE YOU:   Understand these instructions.  Will watch your condition.  Will get help right away if you are not doing well or get worse. Document Released: 01/05/2005 Document Revised: 03/30/2011 Document Reviewed: 08/26/2007 [employer] Patient Information 2015 [employer]. This information is not intended to replace advice given to you by your health care provider. Make sure you discuss any questions you have with your health care provider.  

## 2013-08-07 NOTE — Progress Notes (Signed)
Patient presents for F/U on hypothyroid Requesting med refills States fasting today for blood work C/O 1 day history of left shoulder pain and swelling; rates 9/10 at present. Denies injury. Mom and friend present

## 2013-08-07 NOTE — Progress Notes (Signed)
Patient ID: Kimberly [name], female   DOB: 1990/11/25, 23 y.o.   MRN: [id number]  CC:  Hypothyroidism   HPI:  Patient is here today for medication refill and management of hypothyroidism.  Patient reports that she has been taking her synthroid every other day because she could not afford to get her medication refilled.  Patient reports that she was being seen by a endocrinologist in the past and that she does not do well with generic synthroid, she can only have the brand.  Patient reports a history of depression and she was started on Prozac by [facility] behavioral health in the past.  She reports that she was out of this medication and just started back 2 weeks ago.  Patient denies suicidal or homicidal ideations.  She reports several random crying episode.    Allergies  Allergen Reactions  . Abilify [Aripiprazole]     Lethargy, unable to function  . Amoxicillin-Pot Clavulanate Other (See Comments)    Stomach pains  . Cephalexin Other (See Comments)    Stomach pains   . Latex     Swelling, burning of skin .   Marland Kitchen Tape     Swelling, burning of skin .   Marland Kitchen Zicam Cold Remedy [Erysidoron #1] Nausea And Vomiting  . Vicodin [Hydrocodone-Acetaminophen] Hives and Rash   Past Medical History  Diagnosis Date  . Gastric reflux   . Anxiety   . Depression   . Scoliosis   . 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 Complication of anesthesia     "takes alot to put me to sleep; woke up completely mid endoscopy" (11/16/2012)  . Asthma     "grew out of it" (11/16/2012)  . Iron deficiency anemia   . Migraines     "weekly" (11/16/2012)  . Schizophrenia     "moderate" (11/16/2012)  . Dysrhythmia     ST (11/16/2012)   Current Outpatient Prescriptions on File Prior to Visit  Medication Sig Dispense Refill  . FLUoxetine (PROZAC) 20 MG capsule Take 1 capsule (20 mg total) by mouth 2 (two) times daily.  60 capsule  0  . pantoprazole (PROTONIX) 40 MG tablet Take 1 tablet (40 mg total) by  mouth daily.  30 tablet  2  . doxycycline (VIBRAMYCIN) 100 MG capsule Take 1 capsule (100 mg total) by mouth 2 (two) times daily.  20 capsule  0  . ondansetron (ZOFRAN) 4 MG tablet Take 1 tablet (4 mg total) by mouth every 8 (eight) hours as needed for nausea or vomiting.  10 tablet  0  . triamcinolone cream (KENALOG) 0.1 % Apply 1 application topically 2 (two) times daily.  15 g  2  . [DISCONTINUED] bethanechol (URECHOLINE) 5 MG tablet Take 5 mg by mouth 3 (three) times daily.        No current facility-administered medications on file prior to visit.   Family History  Problem Relation Age of Onset  . Thyroid disease Maternal Grandmother   . Diabetes Mother   . Hypertension Mother   . Vision loss Mother   . Hypertension Father   . Hyperlipidemia Father    History   Social History  . Marital Status: Single    Spouse Name: N/A    Number of Children: N/A  . Years of Education: N/A   Occupational History  . Not on file.   Social History Main Topics  . Smoking status: Never Smoker   . Smokeless tobacco: Never Used  . Alcohol Use:  No  . Drug Use: No  . Sexual Activity: Yes    Birth Control/ Protection: None   Other Topics Concern  . Not on file   Social History Narrative  . No narrative on file   Review of Systems  Constitutional:       Hair loss   HENT: Negative.   Eyes: Negative.   Respiratory: Negative.   Cardiovascular: Positive for palpitations. Negative for chest pain and leg swelling.  Gastrointestinal: Negative.   Neurological: Negative.   Psychiatric/Behavioral: Positive for depression. Negative for suicidal ideas. The patient is nervous/anxious.       Objective:   Filed Vitals:   08/07/13 1040  BP: 99/68  Pulse: 88  Temp: 98.4 F (36.9 C)  Resp: 16    Physical Exam: Constitutional: Patient appears well-developed and well-nourished. No distress. HENT: Normocephalic, atraumatic, External right and left ear normal. Oropharynx is clear and moist.   Eyes: Conjunctivae and EOM are normal. PERRLA, no scleral icterus. Neck: Normal ROM. Neck supple. No JVD. No tracheal deviation. No thyromegaly. CVS: RRR, S1/S2 +, no murmurs, no gallops, no carotid bruit.  Pulmonary: Effort and breath sounds normal, no stridor, rhonchi, wheezes, rales.  Abdominal: Soft. BS +,  no distension, tenderness, rebound or guarding.  Musculoskeletal: Normal range of motion. No edema and no tenderness.  Lymphadenopathy: No lymphadenopathy noted, cervical,  Neuro: Alert. Normal reflexes, muscle tone coordination. Skin: Skin is warm and dry. No rash noted. Not diaphoretic. No erythema. No pallor. Psychiatric: Normal mood and affect. Behavior, judgment, thought content normal.  Lab Results  Component Value Date   WBC 11.8* 07/04/2013   HGB 9.9* 07/04/2013   HCT 32.5* 07/04/2013   MCV 80.4 07/04/2013   PLT 404* 07/04/2013   Lab Results  Component Value Date   CREATININE 0.62 07/04/2013   BUN 5* 07/04/2013   NA 139 07/04/2013   K 3.1* 07/04/2013   CL 98 07/04/2013   CO2 23 07/04/2013    Lab Results  Component Value Date   HGBA1C 5.2 02/25/2012   Lipid Panel  No results found for this basename: chol, trig, hdl, cholhdl, vldl, ldlcalc       Assessment and plan:   Kimberly [name] was seen today for follow-up and hypothyroidism.  Diagnoses and associated orders for this visit:  Unspecified hypothyroidism - pantoprazole (PROTONIX) 40 MG tablet; Take 1 tablet (40 mg total) by mouth daily. - TSH; Future - levothyroxine (SYNTHROID) 150 MCG tablet; Take 1 tablet (150 mcg total) by mouth daily before breakfast.  Hypokalemia - COMPLETE METABOLIC PANEL WITH GFR - Lipid panel  Shoulder injury, left, initial encounter - DG Shoulder Left; Future - diclofenac sodium (VOLTAREN) 1 % GEL; Apply 2 g topically 4 (four) times daily.   Return for tywan appointment, 8 weeks lab, 3 mo pcp.       Chari Manning, [facility] [phone] 08/07/2013, 11:14  AM

## 2013-08-08 ENCOUNTER — Telehealth: Payer: Self-pay | Admitting: *Deleted

## 2013-08-08 NOTE — Telephone Encounter (Signed)
Left message on patient's VM to return call to discuss lab results.

## 2013-08-08 NOTE — Telephone Encounter (Signed)
Message copied by Velora Heckler on Tue Aug 08, 2013  4:33 PM ------      Message from: Chari Manning A      Created: Tue Aug 08, 2013  1:55 PM       Educated patient on diet and weight loss in order to help lower cholesterol. Thanks ------

## 2013-08-09 ENCOUNTER — Telehealth: Payer: Self-pay | Admitting: Internal Medicine

## 2013-08-09 NOTE — Telephone Encounter (Signed)
Pt is returning call to nurse in regards to lab results. Pls contact pt.

## 2013-08-10 ENCOUNTER — Telehealth: Payer: Self-pay | Admitting: *Deleted

## 2013-08-10 NOTE — Telephone Encounter (Signed)
Patient notified of lab results and instructions States understanding of diet and exercise to lower cholesterol

## 2013-08-10 NOTE — Telephone Encounter (Signed)
Pt returning call for results. Please f/u with pt.

## 2013-08-11 ENCOUNTER — Ambulatory Visit (HOSPITAL_COMMUNITY)
Admission: RE | Admit: 2013-08-11 | Discharge: 2013-08-11 | Disposition: A | Discharge status: Home / Self Care | Payer: PRIVATE HEALTH INSURANCE | Source: Ambulatory Visit | Attending: Internal Medicine | Admitting: Internal Medicine

## 2013-08-11 ENCOUNTER — Telehealth: Payer: Self-pay | Admitting: Emergency Medicine

## 2013-08-11 ENCOUNTER — Other Ambulatory Visit: Payer: Self-pay

## 2013-08-11 DIAGNOSIS — S4992XA Unspecified injury of left shoulder and upper arm, initial encounter: Secondary | ICD-10-CM

## 2013-08-11 DIAGNOSIS — M25519 Pain in unspecified shoulder: Secondary | ICD-10-CM | POA: Insufficient documentation

## 2013-08-22 ENCOUNTER — Encounter (HOSPITAL_COMMUNITY): Payer: Self-pay | Admitting: Emergency Medicine

## 2013-08-22 ENCOUNTER — Emergency Department (HOSPITAL_COMMUNITY)
Admission: EM | Admit: 2013-08-22 | Discharge: 2013-08-23 | Disposition: A | Discharge status: Home / Self Care | Payer: PRIVATE HEALTH INSURANCE | Attending: Emergency Medicine | Admitting: Emergency Medicine

## 2013-08-22 DIAGNOSIS — R197 Diarrhea, unspecified: Secondary | ICD-10-CM | POA: Insufficient documentation

## 2013-08-22 DIAGNOSIS — R1013 Epigastric pain: Secondary | ICD-10-CM | POA: Insufficient documentation

## 2013-08-22 DIAGNOSIS — Z3202 Encounter for pregnancy test, result negative: Secondary | ICD-10-CM | POA: Insufficient documentation

## 2013-08-22 DIAGNOSIS — Z880 Allergy status to penicillin: Secondary | ICD-10-CM | POA: Insufficient documentation

## 2013-08-22 DIAGNOSIS — Z8739 Personal history of other diseases of the musculoskeletal system and connective tissue: Secondary | ICD-10-CM | POA: Insufficient documentation

## 2013-08-22 DIAGNOSIS — F329 Major depressive disorder, single episode, unspecified: Secondary | ICD-10-CM | POA: Insufficient documentation

## 2013-08-22 DIAGNOSIS — Z8679 Personal history of other diseases of the circulatory system: Secondary | ICD-10-CM | POA: Insufficient documentation

## 2013-08-22 DIAGNOSIS — Z862 Personal history of diseases of the blood and blood-forming organs and certain disorders involving the immune mechanism: Secondary | ICD-10-CM | POA: Insufficient documentation

## 2013-08-22 DIAGNOSIS — Z9889 Other specified postprocedural states: Secondary | ICD-10-CM | POA: Insufficient documentation

## 2013-08-22 DIAGNOSIS — R112 Nausea with vomiting, unspecified: Secondary | ICD-10-CM | POA: Insufficient documentation

## 2013-08-22 DIAGNOSIS — Z91040 Latex allergy status: Secondary | ICD-10-CM | POA: Insufficient documentation

## 2013-08-22 DIAGNOSIS — F3289 Other specified depressive episodes: Secondary | ICD-10-CM | POA: Insufficient documentation

## 2013-08-22 DIAGNOSIS — K920 Hematemesis: Secondary | ICD-10-CM | POA: Insufficient documentation

## 2013-08-22 DIAGNOSIS — Z79899 Other long term (current) drug therapy: Secondary | ICD-10-CM | POA: Insufficient documentation

## 2013-08-22 DIAGNOSIS — Z8659 Personal history of other mental and behavioral disorders: Secondary | ICD-10-CM | POA: Insufficient documentation

## 2013-08-22 DIAGNOSIS — F411 Generalized anxiety disorder: Secondary | ICD-10-CM | POA: Insufficient documentation

## 2013-08-22 DIAGNOSIS — E071 Dyshormogenetic goiter: Secondary | ICD-10-CM | POA: Insufficient documentation

## 2013-08-22 DIAGNOSIS — J45909 Unspecified asthma, uncomplicated: Secondary | ICD-10-CM | POA: Insufficient documentation

## 2013-08-22 DIAGNOSIS — K219 Gastro-esophageal reflux disease without esophagitis: Secondary | ICD-10-CM | POA: Insufficient documentation

## 2013-08-22 LAB — CBC WITH DIFFERENTIAL/PLATELET
BASOS ABS: 0 10*3/uL (ref 0.0–0.1)
BASOS PCT: 0 % (ref 0–1)
EOS ABS: 0.1 10*3/uL (ref 0.0–0.7)
EOS PCT: 1 % (ref 0–5)
HEMATOCRIT: 33.7 % — AB (ref 36.0–46.0)
Hemoglobin: 10.4 g/dL — ABNORMAL LOW (ref 12.0–15.0)
Lymphocytes Relative: 21 % (ref 12–46)
Lymphs Abs: 2.7 10*3/uL (ref 0.7–4.0)
MCH: 24.2 pg — ABNORMAL LOW (ref 26.0–34.0)
MCHC: 30.9 g/dL (ref 30.0–36.0)
MCV: 78.4 fL (ref 78.0–100.0)
MONO ABS: 1 10*3/uL (ref 0.1–1.0)
Monocytes Relative: 8 % (ref 3–12)
Neutro Abs: 9 10*3/uL — ABNORMAL HIGH (ref 1.7–7.7)
Neutrophils Relative %: 70 % (ref 43–77)
PLATELETS: 473 10*3/uL — AB (ref 150–400)
RBC: 4.3 MIL/uL (ref 3.87–5.11)
RDW: 15 % (ref 11.5–15.5)
WBC: 12.8 10*3/uL — ABNORMAL HIGH (ref 4.0–10.5)

## 2013-08-22 LAB — COMPREHENSIVE METABOLIC PANEL
ALT: 12 U/L (ref 0–35)
AST: 14 U/L (ref 0–37)
Albumin: 3.7 g/dL (ref 3.5–5.2)
Alkaline Phosphatase: 91 U/L (ref 39–117)
Anion gap: 14 (ref 5–15)
BUN: 8 mg/dL (ref 6–23)
CO2: 21 mEq/L (ref 19–32)
CREATININE: 0.64 mg/dL (ref 0.50–1.10)
Calcium: 9.3 mg/dL (ref 8.4–10.5)
Chloride: 106 mEq/L (ref 96–112)
GFR calc Af Amer: >90 mL/min (ref >90)
Glucose, Bld: 79 mg/dL (ref 70–99)
Potassium: 4 mEq/L (ref 3.7–5.3)
SODIUM: 141 meq/L (ref 137–147)
TOTAL PROTEIN: 7.3 g/dL (ref 6.0–8.3)
Total Bilirubin: 0.8 mg/dL (ref 0.3–1.2)

## 2013-08-22 LAB — POC URINE PREG, ED: Preg Test, Ur: NEGATIVE

## 2013-08-22 LAB — LIPASE, BLOOD: Lipase: 28 U/L (ref 11–59)

## 2013-08-22 MED ORDER — ONDANSETRON 4 MG PO TBDP
4.0000 mg | ORAL_TABLET | Freq: Once | ORAL | Status: AC
  Administered 2013-08-22: 4 mg via ORAL
  Filled 2013-08-22: qty 1

## 2013-08-22 MED ORDER — METRONIDAZOLE 500 MG PO TABS
500.0000 mg | ORAL_TABLET | Freq: Once | ORAL | Status: AC
  Administered 2013-08-23: 500 mg via ORAL
  Filled 2013-08-22: qty 1

## 2013-08-22 MED ORDER — SODIUM CHLORIDE 0.9 % IV BOLUS (SEPSIS)
1000.0000 mL | Freq: Once | INTRAVENOUS | Status: AC
  Administered 2013-08-23: 1000 mL via INTRAVENOUS

## 2013-08-22 NOTE — ED Provider Notes (Signed)
CSN: [id number]     Arrival date & time 08/22/13  2023 History   First MD Initiated Contact with Patient 08/22/13 2255     Chief Complaint  Patient presents with  . Hematemesis     (Consider location/radiation/quality/duration/timing/severity/associated sxs/prior Treatment) HPI Kimberly [name] is a 23 y.o. female who complains of N/V for the past week. She said the last two times she threw up there was "a little bit of blood". She also says she has had some diarrhea for the past 3 days. She denies any blood in her stool. She has been able to drink fluids and keep down some food throughout this process. She reports having seen a pediatric GI doc throughout childhood and has had "several endoscopies"- her most recent EGD she reports was 3 yrs ago and showed reflux disease. She reports taking Protonix on a daily basis. She denies fevers, abdominal pain, chest pain, SOB, cough, dizziness. Denies smoking, EtOH use, or any illicit drug use.  Past Medical History  Diagnosis Date  . Gastric reflux   . Anxiety   . Depression   . Scoliosis   . 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 Complication of anesthesia     "takes alot to put me to sleep; woke up completely mid endoscopy" (11/16/2012)  . Asthma     "grew out of it" (11/16/2012)  . Iron deficiency anemia   . Migraines     "weekly" (11/16/2012)  . Schizophrenia     "moderate" (11/16/2012)  . Dysrhythmia     ST (11/16/2012)   Past Surgical History  Procedure Laterality Date  . Upper gi endoscopy  ~ 2006; ~2008   Family History  Problem Relation Age of Onset  . Thyroid disease Maternal Grandmother   . Diabetes Mother   . Hypertension Mother   . Vision loss Mother   . Hypertension Father   . Hyperlipidemia Father    History  Substance Use Topics  . Smoking status: Never Smoker   . Smokeless tobacco: Never Used  . Alcohol Use: No   OB History   Grav Para Term Preterm Abortions TAB SAB Ect Mult Living   0               Review of Systems  Constitutional: Negative for fever.  HENT: Negative for congestion.   Eyes: Negative for visual disturbance.  Respiratory: Negative for cough, chest tightness and shortness of breath.   Cardiovascular: Negative for chest pain and palpitations.  Gastrointestinal: Positive for nausea, vomiting and diarrhea. Negative for abdominal pain, blood in stool and abdominal distention.  Genitourinary: Negative for dysuria, flank pain, difficulty urinating, menstrual problem and pelvic pain.  Skin: Negative for color change and rash.  Neurological: Negative for dizziness, syncope, weakness and numbness.  All other systems reviewed and are negative.     Allergies  Abilify; Amoxicillin-pot clavulanate; Cephalexin; Latex; Tape; Zicam cold remedy; and Vicodin  Home Medications   Prior to Admission medications   Medication Sig Start Date End Date Taking? Authorizing Provider  FLUoxetine (PROZAC) 20 MG capsule Take 1 capsule (20 mg total) by mouth 2 (two) times daily. 07/04/13  Yes Noland Fordyce, PA-C  levothyroxine (SYNTHROID) 150 MCG tablet Take 1 tablet (150 mcg total) by mouth daily before breakfast. 08/07/13  Yes Lance Bosch, NP  pantoprazole (PROTONIX) 40 MG tablet Take 40 mg by mouth daily. 08/07/13  Yes Lance Bosch, NP  metroNIDAZOLE (FLAGYL) 500 MG tablet Take 1 tablet (500 mg  total) by mouth 3 (three) times daily. 08/23/13   Johnna Acosta, MD  naproxen (NAPROSYN) 500 MG tablet Take 1 tablet (500 mg total) by mouth 2 (two) times daily with a meal. 08/23/13   Johnna Acosta, MD  ondansetron (ZOFRAN ODT) 4 MG disintegrating tablet Take 1 tablet (4 mg total) by mouth every 8 (eight) hours as needed for nausea. 08/23/13   Johnna Acosta, MD  promethazine (PHENERGAN) 25 MG tablet Take 1 tablet (25 mg total) by mouth every 6 (six) hours as needed for nausea or vomiting. 08/23/13   Johnna Acosta, MD   BP 121/78  Pulse 108  Temp(Src) 98.7 F (37.1 C) (Oral)  Resp 18   SpO2 96%  LMP 07/09/2013 Physical Exam  Constitutional: She is oriented to person, place, and time. She appears well-developed and well-nourished. No distress.  HENT:  Head: Normocephalic and atraumatic.  Mouth/Throat: Oropharynx is clear and moist.  Eyes: Conjunctivae and EOM are normal.  Neck: Normal range of motion.  Cardiovascular: Normal rate, regular rhythm and normal heart sounds.  Exam reveals no gallop and no friction rub.   No murmur heard. Pulmonary/Chest: Effort normal and breath sounds normal.  Abdominal: Soft. Bowel sounds are normal. She exhibits no distension. There is no rebound.  Mild tenderness over epigastric area.  Neurological: She is alert and oriented to person, place, and time.  Skin: Skin is warm and dry. She is not diaphoretic.    ED Course  Procedures (including critical care time) Labs Review Labs Reviewed  CBC WITH DIFFERENTIAL - Abnormal; Notable for the following:    WBC 12.8 (*)    Hemoglobin 10.4 (*)    HCT 33.7 (*)    MCH 24.2 (*)    Platelets 473 (*)    Neutro Abs 9.0 (*)    All other components within normal limits  URINALYSIS, ROUTINE W REFLEX MICROSCOPIC - Abnormal; Notable for the following:    Color, Urine AMBER (*)    APPearance CLOUDY (*)    Specific Gravity, Urine 1.031 (*)    Bilirubin Urine SMALL (*)    Leukocytes, UA TRACE (*)    All other components within normal limits  URINE MICROSCOPIC-ADD ON - Abnormal; Notable for the following:    Squamous Epithelial / LPF FEW (*)    Bacteria, UA FEW (*)    All other components within normal limits  CLOSTRIDIUM DIFFICILE BY PCR  COMPREHENSIVE METABOLIC PANEL  LIPASE, BLOOD  POC URINE PREG, ED    Imaging Review No results found.   EKG Interpretation None    Nausea improved in ED with ODT Zofran PT non-tachycardic on my exam despite nurse vitals No vomiting during ED stay. Pt said she felt better after fluid bolus. Pt able to tolerate PO fluids before discharge   MDM    Final diagnoses:  Diarrhea  Non-intractable vomiting with nausea, vomiting of unspecified type   Diarrhea- Advised proper hydration. Due to routine use of PPI will treat for potential C. Diff Nausea- DC with Zofran and Phenergan  Naproxen given for gastric inflammation  Will f/u with Nicola Police, PA-C 08/23/13 2256

## 2013-08-22 NOTE — ED Notes (Signed)
Pt states she has been vomiting for the past week and today she was vomited 7 times and noticed blood. Pt states weakness and blurred vision.

## 2013-08-22 NOTE — ED Provider Notes (Signed)
23 year old female, history of gastroesophageal reflux disease on Protonix who presents with one week of diarrhea, 3 weeks of vomiting. She states that she has diffuse abdominal cramping, worse in the epigastrium but felt in all 4 quadrants associated with watery green diarrhea which is foul-smelling and associated with chills and night sweats. She denies fevers, has been able to tolerate very little fluids but is able to tolerate some and does state that she has urinary frequency. On exam the patient has diffuse mild abdominal tenderness without guarding or peritoneal signs, clear heart and lung sounds, no tachycardia on my exam though initially she did present with this. Laboratory workup showed leukocytosis but no other acute findings including her metabolic panel and renal function which are normal. Will have urinalysis to gauge hydration and check for urinary infection, suspect C. difficile as patient is on proton pump inhibitor with an extended GI illness the also consider inflammatory bowel disease and other infectious etiologies. She does not appear to be in distress whatsoever. IV fluids, antibiotics, close followup with her gastroenterologist.  Medical screening examination/treatment/procedure(s) were conducted as a shared visit with non-physician practitioner(s) and myself.  I personally evaluated the patient during the encounter.  Clinical Impression: Gastroenteritis - possible C dif      Johnna Acosta, MD 08/23/13 2259

## 2013-08-23 LAB — URINE MICROSCOPIC-ADD ON

## 2013-08-23 LAB — URINALYSIS, ROUTINE W REFLEX MICROSCOPIC
Glucose, UA: NEGATIVE mg/dL
Hgb urine dipstick: NEGATIVE
KETONES UR: NEGATIVE mg/dL
Nitrite: NEGATIVE
PH: 5 (ref 5.0–8.0)
Protein, ur: NEGATIVE mg/dL
SPECIFIC GRAVITY, URINE: 1.031 — AB (ref 1.005–1.030)
UROBILINOGEN UA: 1 mg/dL (ref 0.0–1.0)

## 2013-08-23 MED ORDER — PROMETHAZINE HCL 25 MG PO TABS: 25.0000 mg | ORAL_TABLET | Freq: Four times a day (QID) | ORAL | Status: DC | PRN

## 2013-08-23 MED ORDER — METRONIDAZOLE 500 MG PO TABS: 500.0000 mg | ORAL_TABLET | Freq: Three times a day (TID) | ORAL | Status: DC

## 2013-08-23 MED ORDER — ONDANSETRON 4 MG PO TBDP: 4.0000 mg | ORAL_TABLET | Freq: Three times a day (TID) | ORAL | Status: DC | PRN

## 2013-08-23 MED ORDER — NAPROXEN 500 MG PO TABS: 500.0000 mg | ORAL_TABLET | Freq: Two times a day (BID) | ORAL | Status: DC

## 2013-08-23 NOTE — ED Notes (Signed)
IV attempted x 2, will have 2nd attempt

## 2013-08-23 NOTE — ED Provider Notes (Signed)
Medical screening examination/treatment/procedure(s) were conducted as a shared visit with non-physician practitioner(s) and myself.  I personally evaluated the patient during the encounter  Please see my separate respective documentation pertaining to this patient encounter   Johnna Acosta, MD 08/23/13 2258

## 2013-08-23 NOTE — Discharge Instructions (Signed)
Clostridium Difficile Toxin °This is a test which may be done when a patient has diarrhea that lasts for several days, or has abdominal pain, fever, and nausea after antibiotic therapy.  °This test looks for the presence of Clostridium difficile (C.diff.) toxin in a stool sample. C.diff. is a germ (bacterium) that is one of the groups of bacteria that are usually in the colon, called "normal flora." If something upsets the growth of the other normal flora, C.diff. may overgrow and disrupt the balance of bacteria in the colon. C. diff. may produce two toxins, A and B. The combination of overgrowth and toxins may cause prolonged diarrhea. The toxins may damage the lining of the colon and lead to colitis.  °While some cases of C. diff. diarrhea and colitis do not require treatment, others require specific oral antibiotic therapy. Most patients improve as the normal flora re-establishes itself, but about some may have one or more relapses, with symptoms and detectible toxin levels coming back. °PREPARATION FOR TEST °There is no special preparation for the test. A fresh stool sample is collected in a sterile container. The sample should not be mixed with urine or water. The stool should be taken to the lab within an hour. It may be refrigerated or frozen and taken to the lab as soon as possible. The container should be labeled with your name and the date and time of the stool collection.  °NORMAL FINDINGS °Negative Tissue Culture (no toxin identified) °Ranges for normal findings may vary among different laboratories and hospitals. You should always check with your doctor after having lab work or other tests done to discuss the meaning of your test results and whether your values are considered within normal limits. °MEANING OF TEST  °Your caregiver will go over the test results with you and discuss the importance and meaning of your results, as well as treatment options and the need for additional tests if  necessary. °OBTAINING THE TEST RESULTS °It is your responsibility to obtain your test results. Ask the lab or department performing the test when and how you will get your results. °Document Released: 01/29/2004 Document Revised: 03/30/2011 Document Reviewed: 12/15/2007 °[employer]® Patient Information ©2015 [employer]. This information is not intended to replace advice given to you by your health care provider. Make sure you discuss any questions you have with your health care provider. ° °

## 2013-10-02 ENCOUNTER — Other Ambulatory Visit: Payer: Self-pay

## 2013-10-18 ENCOUNTER — Ambulatory Visit: Payer: PRIVATE HEALTH INSURANCE | Attending: Internal Medicine

## 2013-10-18 DIAGNOSIS — E039 Hypothyroidism, unspecified: Secondary | ICD-10-CM

## 2013-10-19 LAB — TSH: TSH: 6.04 u[IU]/mL — ABNORMAL HIGH (ref 0.350–4.500)

## 2013-10-23 ENCOUNTER — Other Ambulatory Visit: Payer: Self-pay

## 2013-10-23 ENCOUNTER — Encounter: Payer: Self-pay | Admitting: Internal Medicine

## 2013-10-23 ENCOUNTER — Ambulatory Visit: Payer: PRIVATE HEALTH INSURANCE | Attending: Internal Medicine | Admitting: Internal Medicine

## 2013-10-23 VITALS — BP 112/78 | HR 87 | Temp 98.3°F | Resp 16 | Ht 64.0 in | Wt 236.0 lb

## 2013-10-23 DIAGNOSIS — E038 Other specified hypothyroidism: Secondary | ICD-10-CM

## 2013-10-23 DIAGNOSIS — F209 Schizophrenia, unspecified: Secondary | ICD-10-CM | POA: Insufficient documentation

## 2013-10-23 DIAGNOSIS — D509 Iron deficiency anemia, unspecified: Secondary | ICD-10-CM | POA: Insufficient documentation

## 2013-10-23 DIAGNOSIS — F419 Anxiety disorder, unspecified: Secondary | ICD-10-CM | POA: Insufficient documentation

## 2013-10-23 DIAGNOSIS — E063 Autoimmune thyroiditis: Secondary | ICD-10-CM

## 2013-10-23 DIAGNOSIS — Z79899 Other long term (current) drug therapy: Secondary | ICD-10-CM | POA: Insufficient documentation

## 2013-10-23 DIAGNOSIS — G43909 Migraine, unspecified, not intractable, without status migrainosus: Secondary | ICD-10-CM | POA: Insufficient documentation

## 2013-10-23 DIAGNOSIS — I499 Cardiac arrhythmia, unspecified: Secondary | ICD-10-CM | POA: Insufficient documentation

## 2013-10-23 DIAGNOSIS — F329 Major depressive disorder, single episode, unspecified: Secondary | ICD-10-CM | POA: Insufficient documentation

## 2013-10-23 DIAGNOSIS — F32A Depression, unspecified: Secondary | ICD-10-CM

## 2013-10-23 DIAGNOSIS — E039 Hypothyroidism, unspecified: Secondary | ICD-10-CM | POA: Insufficient documentation

## 2013-10-23 DIAGNOSIS — K219 Gastro-esophageal reflux disease without esophagitis: Secondary | ICD-10-CM | POA: Insufficient documentation

## 2013-10-23 DIAGNOSIS — M419 Scoliosis, unspecified: Secondary | ICD-10-CM | POA: Insufficient documentation

## 2013-10-23 MED ORDER — ESOMEPRAZOLE MAGNESIUM 40 MG PO CPDR: 40.0000 mg | DELAYED_RELEASE_CAPSULE | Freq: Every day | ORAL | Status: DC

## 2013-10-23 MED ORDER — LEVOTHYROXINE SODIUM 150 MCG PO TABS: 150.0000 ug | ORAL_TABLET | Freq: Every day | ORAL | Status: DC

## 2013-10-23 MED ORDER — FLUOXETINE HCL 20 MG PO CAPS: 20.0000 mg | ORAL_CAPSULE | Freq: Two times a day (BID) | ORAL | Status: DC

## 2013-10-23 NOTE — Progress Notes (Signed)
Pt here to discuss thyroid medication States she was off medication for 7 days with sx's worsening anxiety,feeling jittery with cold extremities Pt states she picked medication at pharmacy last Friday 125mcg with restart New lab results posted 10/18/13 Pt requesting medication change Protonix for hemoptysis  Need refill on Prozac

## 2013-10-23 NOTE — Patient Instructions (Signed)

## 2013-10-23 NOTE — Progress Notes (Signed)
Patient ID: Kimberly [name], female   DOB: April 21, 1990, 23 y.o.   MRN: [id number]  CC: hypothyroidism   HPI:  Patient reports that she has been out of her medication for over one week before the last blood draw a few days ago.  She states that the pharmacy did not have her dose of synthroid on file so they gave her synthroid 100 mcg and told her to take those until her dose comes in.  She states that she has only been taking 100 mcg for the past three days.  The patient reports that she has been out of her Prozac for two week.  Her mom states that she has been having more crying spells and withdrawal from activity.  She reports that she does not feel the prozac is working anymore.  She states that she was on abilify in the past but was taken off due to feeling spaced out often.  She reports that she was told that she is borderline schizophrenic and bipolar. The patient has not set up a appointment for counseling or with psychiatry.  She denies SI.   Allergies  Allergen Reactions  . Abilify [Aripiprazole]     Lethargy, unable to function  . Amoxicillin-Pot Clavulanate Other (See Comments)    Stomach pains  . Cephalexin Other (See Comments)    Stomach pains   . Latex     Swelling, burning of skin .   Marland Kitchen Tape     Swelling, burning of skin .   Marland Kitchen Zicam Cold Remedy [Erysidoron #1] Nausea And Vomiting  . Vicodin [Hydrocodone-Acetaminophen] Hives and Rash   Past Medical History  Diagnosis Date  . Gastric reflux   . Anxiety   . Depression   . Scoliosis   . 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 Complication of anesthesia     "takes alot to put me to sleep; woke up completely mid endoscopy" (11/16/2012)  . Asthma     "grew out of it" (11/16/2012)  . Iron deficiency anemia   . Migraines     "weekly" (11/16/2012)  . Schizophrenia     "moderate" (11/16/2012)  . Dysrhythmia     ST (11/16/2012)   Current Outpatient Prescriptions on File Prior to Visit  Medication Sig  Dispense Refill  . FLUoxetine (PROZAC) 20 MG capsule Take 1 capsule (20 mg total) by mouth 2 (two) times daily.  60 capsule  0  . pantoprazole (PROTONIX) 40 MG tablet Take 40 mg by mouth daily.      Marland Kitchen levothyroxine (SYNTHROID) 150 MCG tablet Take 1 tablet (150 mcg total) by mouth daily before breakfast.  30 tablet  4  . metroNIDAZOLE (FLAGYL) 500 MG tablet Take 1 tablet (500 mg total) by mouth 3 (three) times daily.  42 tablet  0  . naproxen (NAPROSYN) 500 MG tablet Take 1 tablet (500 mg total) by mouth 2 (two) times daily with a meal.  30 tablet  0  . ondansetron (ZOFRAN ODT) 4 MG disintegrating tablet Take 1 tablet (4 mg total) by mouth every 8 (eight) hours as needed for nausea.  10 tablet  0  . promethazine (PHENERGAN) 25 MG tablet Take 1 tablet (25 mg total) by mouth every 6 (six) hours as needed for nausea or vomiting.  12 tablet  0  . [DISCONTINUED] bethanechol (URECHOLINE) 5 MG tablet Take 5 mg by mouth 3 (three) times daily.        No current facility-administered medications on file prior to  visit.   Family History  Problem Relation Age of Onset  . Thyroid disease Maternal Grandmother   . Diabetes Mother   . Hypertension Mother   . Vision loss Mother   . Hypertension Father   . Hyperlipidemia Father    History   Social History  . Marital Status: Single    Spouse Name: N/A    Number of Children: N/A  . Years of Education: N/A   Occupational History  . Not on file.   Social History Main Topics  . Smoking status: Never Smoker   . Smokeless tobacco: Never Used  . Alcohol Use: No  . Drug Use: No  . Sexual Activity: Yes    Birth Control/ Protection: None   Other Topics Concern  . Not on file   Social History Narrative  . No narrative on file   Review of Systems  Constitutional: Positive for malaise/fatigue.  Gastrointestinal: Positive for nausea and vomiting. Negative for constipation.  Neurological: Positive for weakness. Negative for dizziness and tingling.      Objective:   Filed Vitals:   10/23/13 1222  BP: 112/78  Pulse: 87  Temp: 98.3 F (36.8 C)  Resp: 16    Physical Exam  Constitutional: She is oriented to person, place, and time.  Cardiovascular: Normal rate, regular rhythm and normal heart sounds.   No murmur heard. Pulmonary/Chest: Effort normal and breath sounds normal.  Abdominal: Soft. Bowel sounds are normal.  Neurological: She is alert and oriented to person, place, and time. She has normal reflexes.  Skin: Skin is warm and dry.  Psychiatric:  Withdrawn      Lab Results  Component Value Date   WBC 12.8* 08/22/2013   HGB 10.4* 08/22/2013   HCT 33.7* 08/22/2013   MCV 78.4 08/22/2013   PLT 473* 08/22/2013   Lab Results  Component Value Date   CREATININE 0.64 08/22/2013   BUN 8 08/22/2013   NA 141 08/22/2013   K 4.0 08/22/2013   CL 106 08/22/2013   CO2 21 08/22/2013    Lab Results  Component Value Date   HGBA1C 5.2 02/25/2012   Lipid Panel     Component Value Date/Time   CHOL 185 08/07/2013 1141   TRIG 146 08/07/2013 1141   HDL 42 08/07/2013 1141   CHOLHDL 4.4 08/07/2013 1141   VLDL 29 08/07/2013 1141   LDLCALC 114* 08/07/2013 1141       Assessment and plan:   Aleya was seen today for follow-up and hypothyroidism.  Diagnoses and associated orders for this visit:  Other specified hypothyroidism - levothyroxine (SYNTHROID) 150 MCG tablet; Take 1 tablet (150 mcg total) by mouth daily before breakfast. - TSH; Future  Depression - Refill FLUoxetine (PROZAC) 20 MG capsule; Take 1 capsule (20 mg total) by mouth 2 (two) times daily. Urged patient to establish care at [facility] for medication management and changes if needed  Patient urged to set appointment with Lenell Antu, LCSW for counseling  Gastroesophageal reflux disease, esophagitis presence not specified - esomeprazole (NEXIUM) 40 MG capsule; Take 1 capsule (40 mg total) by mouth daily.  Return in about 3 months (around 01/23/2014) for Lab  Visit-TSH.       Chari Manning, NP-C [facility] [phone] 10/23/2013, 12:25 PM

## 2013-11-03 ENCOUNTER — Other Ambulatory Visit: Payer: Self-pay

## 2014-01-14 ENCOUNTER — Encounter (HOSPITAL_COMMUNITY): Payer: Self-pay | Admitting: Emergency Medicine

## 2014-01-14 ENCOUNTER — Emergency Department (HOSPITAL_COMMUNITY)
Admission: EM | Admit: 2014-01-14 | Discharge: 2014-01-15 | Disposition: A | Discharge status: Home / Self Care | Payer: PRIVATE HEALTH INSURANCE | Attending: Emergency Medicine | Admitting: Emergency Medicine

## 2014-01-14 DIAGNOSIS — M419 Scoliosis, unspecified: Secondary | ICD-10-CM | POA: Insufficient documentation

## 2014-01-14 DIAGNOSIS — E071 Dyshormogenetic goiter: Secondary | ICD-10-CM | POA: Insufficient documentation

## 2014-01-14 DIAGNOSIS — B349 Viral infection, unspecified: Secondary | ICD-10-CM | POA: Insufficient documentation

## 2014-01-14 DIAGNOSIS — J45909 Unspecified asthma, uncomplicated: Secondary | ICD-10-CM | POA: Insufficient documentation

## 2014-01-14 DIAGNOSIS — B9689 Other specified bacterial agents as the cause of diseases classified elsewhere: Secondary | ICD-10-CM

## 2014-01-14 DIAGNOSIS — F419 Anxiety disorder, unspecified: Secondary | ICD-10-CM | POA: Insufficient documentation

## 2014-01-14 DIAGNOSIS — N760 Acute vaginitis: Secondary | ICD-10-CM | POA: Insufficient documentation

## 2014-01-14 DIAGNOSIS — D509 Iron deficiency anemia, unspecified: Secondary | ICD-10-CM

## 2014-01-14 DIAGNOSIS — Z880 Allergy status to penicillin: Secondary | ICD-10-CM | POA: Insufficient documentation

## 2014-01-14 DIAGNOSIS — F329 Major depressive disorder, single episode, unspecified: Secondary | ICD-10-CM | POA: Insufficient documentation

## 2014-01-14 DIAGNOSIS — Z3202 Encounter for pregnancy test, result negative: Secondary | ICD-10-CM | POA: Insufficient documentation

## 2014-01-14 DIAGNOSIS — Z79899 Other long term (current) drug therapy: Secondary | ICD-10-CM | POA: Insufficient documentation

## 2014-01-14 DIAGNOSIS — Z792 Long term (current) use of antibiotics: Secondary | ICD-10-CM | POA: Insufficient documentation

## 2014-01-14 DIAGNOSIS — K219 Gastro-esophageal reflux disease without esophagitis: Secondary | ICD-10-CM | POA: Insufficient documentation

## 2014-01-14 DIAGNOSIS — Z91040 Latex allergy status: Secondary | ICD-10-CM | POA: Insufficient documentation

## 2014-01-14 LAB — URINALYSIS, ROUTINE W REFLEX MICROSCOPIC
Bilirubin Urine: NEGATIVE
GLUCOSE, UA: NEGATIVE mg/dL
KETONES UR: 15 mg/dL — AB
LEUKOCYTES UA: NEGATIVE
NITRITE: NEGATIVE
Protein, ur: NEGATIVE mg/dL
Specific Gravity, Urine: 1.033 — ABNORMAL HIGH (ref 1.005–1.030)
Urobilinogen, UA: 1 mg/dL (ref 0.0–1.0)
pH: 6.5 (ref 5.0–8.0)

## 2014-01-14 LAB — URINE MICROSCOPIC-ADD ON

## 2014-01-14 LAB — CBC WITH DIFFERENTIAL/PLATELET
BASOS ABS: 0 10*3/uL (ref 0.0–0.1)
BASOS PCT: 0 % (ref 0–1)
EOS ABS: 0.2 10*3/uL (ref 0.0–0.7)
EOS PCT: 2 % (ref 0–5)
HCT: 31.8 % — ABNORMAL LOW (ref 36.0–46.0)
Hemoglobin: 9.4 g/dL — ABNORMAL LOW (ref 12.0–15.0)
Lymphocytes Relative: 27 % (ref 12–46)
Lymphs Abs: 3.1 10*3/uL (ref 0.7–4.0)
MCH: 22.8 pg — ABNORMAL LOW (ref 26.0–34.0)
MCHC: 29.6 g/dL — AB (ref 30.0–36.0)
MCV: 77.2 fL — ABNORMAL LOW (ref 78.0–100.0)
MONO ABS: 0.8 10*3/uL (ref 0.1–1.0)
Monocytes Relative: 7 % (ref 3–12)
NEUTROS ABS: 7.4 10*3/uL (ref 1.7–7.7)
Neutrophils Relative %: 64 % (ref 43–77)
Platelets: 422 10*3/uL — ABNORMAL HIGH (ref 150–400)
RBC: 4.12 MIL/uL (ref 3.87–5.11)
RDW: 15.5 % (ref 11.5–15.5)
WBC: 11.6 10*3/uL — ABNORMAL HIGH (ref 4.0–10.5)

## 2014-01-14 LAB — COMPREHENSIVE METABOLIC PANEL
ALT: 15 U/L (ref 0–35)
AST: 16 U/L (ref 0–37)
Albumin: 3.3 g/dL — ABNORMAL LOW (ref 3.5–5.2)
Alkaline Phosphatase: 73 U/L (ref 39–117)
Anion gap: 7 (ref 5–15)
BILIRUBIN TOTAL: 0.3 mg/dL (ref 0.3–1.2)
BUN: 9 mg/dL (ref 6–23)
CHLORIDE: 108 meq/L (ref 96–112)
CO2: 23 mmol/L (ref 19–32)
Calcium: 8.8 mg/dL (ref 8.4–10.5)
Creatinine, Ser: 0.73 mg/dL (ref 0.50–1.10)
GFR calc Af Amer: >90 mL/min (ref >90)
Glucose, Bld: 98 mg/dL (ref 70–99)
Potassium: 3.9 mmol/L (ref 3.5–5.1)
Sodium: 138 mmol/L (ref 135–145)
Total Protein: 6.5 g/dL (ref 6.0–8.3)

## 2014-01-14 LAB — POC URINE PREG, ED: PREG TEST UR: NEGATIVE

## 2014-01-14 MED ORDER — ONDANSETRON HCL 4 MG/2ML IJ SOLN
4.0000 mg | Freq: Once | INTRAMUSCULAR | Status: AC
  Administered 2014-01-14: 4 mg via INTRAVENOUS
  Filled 2014-01-14: qty 2

## 2014-01-14 MED ORDER — SODIUM CHLORIDE 0.9 % IV BOLUS (SEPSIS)
1000.0000 mL | INTRAVENOUS | Status: AC
  Administered 2014-01-14: 1000 mL via INTRAVENOUS

## 2014-01-14 MED ORDER — KETOROLAC TROMETHAMINE 30 MG/ML IJ SOLN
30.0000 mg | Freq: Once | INTRAMUSCULAR | Status: AC
  Administered 2014-01-14: 30 mg via INTRAVENOUS
  Filled 2014-01-14: qty 1

## 2014-01-14 NOTE — ED Notes (Signed)
The patient has a history of UTI and thinks she has one now.  The mother said she is "disoriented" and has done this before with a UTI.  She says she feels pressure, urgency, cloudy urine and malodorous.  She denies any blood in her urine.  She rates her pain 7/10.

## 2014-01-14 NOTE — ED Provider Notes (Signed)
CSN: [id number]     Arrival date & time 01/14/14  2149 History   First MD Initiated Contact with Patient 01/14/14 2242     Chief Complaint  Patient presents with  . Urinary Tract Infection    The patient has a history of UTI and thinks she has one now.  The mother said she is "disoriented" and has done this before with a UTI.    (Consider location/radiation/quality/duration/timing/severity/associated sxs/prior Treatment) HPI Kimberly [name] is a 23 yo female presenting with report of feeling more tired lately with nausea, vomiting and diarrhea x 1 week.  She states she has noticed she is more forgetful at times in this past week.  She states she has not been able to take her full dose of her thyroid medicine because she is trying to make it stretch and thinks that may be related to her forgetfulness symptoms.  She also has noticed some urinary symptoms over the last week including pressure and some frequency.  She has some white vaginal discharge but would like to be checked for STI.  She denies any fevers, chills, focal neuro deficit, chest pain, shortness of breath, or palpitations.  Past Medical History  Diagnosis Date  . Gastric reflux   . Anxiety   . Depression   . Scoliosis   . 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 Complication of anesthesia     "takes alot to put me to sleep; woke up completely mid endoscopy" (11/16/2012)  . Asthma     "grew out of it" (11/16/2012)  . Iron deficiency anemia   . Migraines     "weekly" (11/16/2012)  . Schizophrenia     "moderate" (11/16/2012)  . Dysrhythmia     ST (11/16/2012)   Past Surgical History  Procedure Laterality Date  . Upper gi endoscopy  ~ 2006; ~2008   Family History  Problem Relation Age of Onset  . Thyroid disease Maternal Grandmother   . Diabetes Mother   . Hypertension Mother   . Vision loss Mother   . Hypertension Father   . Hyperlipidemia Father    History  Substance Use Topics  . Smoking  status: Never Smoker   . Smokeless tobacco: Never Used  . Alcohol Use: No   OB History    Gravida Para Term Preterm AB TAB SAB Ectopic Multiple Living   0              Review of Systems  Constitutional: Positive for fatigue. Negative for fever and chills.  HENT: Negative for sore throat.   Eyes: Negative for visual disturbance.  Respiratory: Negative for cough and shortness of breath.   Cardiovascular: Negative for chest pain and leg swelling.  Gastrointestinal: Positive for nausea, vomiting and diarrhea.  Genitourinary: Positive for dysuria.  Musculoskeletal: Negative for myalgias.  Skin: Negative for rash.  Neurological: Negative for weakness, numbness and headaches.    Allergies  Abilify; Amoxicillin-pot clavulanate; Cephalexin; Latex; Tape; Zicam cold remedy; and Vicodin  Home Medications   Prior to Admission medications   Medication Sig Start Date End Date Taking? Authorizing Provider  esomeprazole (NEXIUM) 40 MG capsule Take 1 capsule (40 mg total) by mouth daily. 10/23/13   Lance Bosch, NP  FLUoxetine (PROZAC) 20 MG capsule Take 1 capsule (20 mg total) by mouth 2 (two) times daily. 10/23/13   Lance Bosch, NP  levothyroxine (SYNTHROID) 150 MCG tablet Take 1 tablet (150 mcg total) by mouth daily before breakfast. 10/23/13  Lance Bosch, NP  metroNIDAZOLE (FLAGYL) 500 MG tablet Take 1 tablet (500 mg total) by mouth 3 (three) times daily. 08/23/13   Johnna Acosta, MD  naproxen (NAPROSYN) 500 MG tablet Take 1 tablet (500 mg total) by mouth 2 (two) times daily with a meal. 08/23/13   Johnna Acosta, MD  ondansetron (ZOFRAN ODT) 4 MG disintegrating tablet Take 1 tablet (4 mg total) by mouth every 8 (eight) hours as needed for nausea. 08/23/13   Johnna Acosta, MD  promethazine (PHENERGAN) 25 MG tablet Take 1 tablet (25 mg total) by mouth every 6 (six) hours as needed for nausea or vomiting. 08/23/13   Johnna Acosta, MD   BP 134/91 mmHg  Pulse 95  Temp(Src) 98.6 F (37 C)  (Oral)  Resp 20  Ht 5\' 4"  (1.626 m)  Wt 240 lb (108.863 kg)  BMI 41.18 kg/m2  SpO2 99%  LMP 12/19/2013 Physical Exam  Constitutional: She is oriented to person, place, and time. She appears well-developed and well-nourished. No distress.  HENT:  Head: Normocephalic and atraumatic.  Mouth/Throat: Oropharynx is clear and moist. No oropharyngeal exudate.  Eyes: Conjunctivae are normal.  Neck: Neck supple. No thyromegaly present.  Cardiovascular: Normal rate, regular rhythm and intact distal pulses.   Pulmonary/Chest: Effort normal and breath sounds normal. No respiratory distress. She has no wheezes. She has no rales. She exhibits no tenderness.  Abdominal: Soft. She exhibits no distension and no mass. There is no tenderness. There is no rebound and no guarding.  Genitourinary: There is no rash or tenderness on the right labia. There is no rash or tenderness on the left labia. Cervix exhibits no motion tenderness. Right adnexum displays no mass, no tenderness and no fullness. Left adnexum displays no mass, no tenderness and no fullness. There is bleeding in the vagina.  Musculoskeletal: She exhibits no tenderness.  Lymphadenopathy:    She has no cervical adenopathy.  Neurological: She is alert and oriented to person, place, and time. No cranial nerve deficit.  Skin: Skin is warm and dry. No rash noted. She is not diaphoretic.  Psychiatric: She has a normal mood and affect.  Nursing note and vitals reviewed.   ED Course  Procedures (including critical care time) Labs Review Labs Reviewed  WET PREP, GENITAL - Abnormal; Notable for the following:    Clue Cells Wet Prep HPF POC MANY (*)    WBC, Wet Prep HPF POC FEW (*)    All other components within normal limits  COMPREHENSIVE METABOLIC PANEL - Abnormal; Notable for the following:    Albumin 3.3 (*)    All other components within normal limits  URINALYSIS, ROUTINE W REFLEX MICROSCOPIC - Abnormal; Notable for the following:     APPearance CLOUDY (*)    Specific Gravity, Urine 1.033 (*)    Hgb urine dipstick MODERATE (*)    Ketones, ur 15 (*)    All other components within normal limits  CBC WITH DIFFERENTIAL - Abnormal; Notable for the following:    WBC 11.6 (*)    Hemoglobin 9.4 (*)    HCT 31.8 (*)    MCV 77.2 (*)    MCH 22.8 (*)    MCHC 29.6 (*)    Platelets 422 (*)    All other components within normal limits  URINE MICROSCOPIC-ADD ON - Abnormal; Notable for the following:    Squamous Epithelial / LPF FEW (*)    All other components within normal limits  GC/CHLAMYDIA PROBE AMP  POC URINE PREG, ED    Imaging Review No results found.   EKG Interpretation None      MDM   Final diagnoses:  Viral illness  Iron deficiency anemia  Bacterial vaginosis   23 yo with various complaints including recent nausea, vomiting, diarrhea, general weakness, gradual onset headache and dysuria.  CBC, CMP, UA U preg. Pelvic exam.  CBC shows mild anemia most likely related to iron deficiency. Urine negative for UTI but shows moderate hgb consistent with pt just starting menstrual cycle.  Wet prep shows findings consistent with BV.  NS liter bolus and headache cocktail given and pt reports improvement in symptoms. Pt aware of her IDA but has not been taking iron supplement. Prescriptions provided for Flagyl, and iron supplements. Pt is well-appearing, in no acute distress and vital signs are stable.  They appear safe to be discharged.  Discharge include follow-up with their PCP.  Return precautions provided.    Filed Vitals:   01/14/14 2210 01/14/14 2245 01/14/14 2300  BP: 125/75 130/83 134/91  Pulse: 95 90 95  Temp: 98.6 F (37 C)    TempSrc: Oral    Resp: 20    Height: 5\' 4"  (1.626 m)    Weight: 240 lb (108.863 kg)    SpO2: 97% 97% 99%   Meds given in ED:  Medications  sodium chloride 0.9 % bolus 1,000 mL (1,000 mLs Intravenous New Bag/Given 01/14/14 2357)  ketorolac (TORADOL) 30 MG/ML injection 30 mg (30  mg Intravenous Given 01/14/14 2357)  ondansetron (ZOFRAN) injection 4 mg (4 mg Intravenous Given 01/14/14 2357)    Discharge Medication List as of 01/15/2014  2:26 AM    START taking these medications   Details  !! metroNIDAZOLE (FLAGYL) 500 MG tablet Take 1 tablet (500 mg total) by mouth 2 (two) times daily., Starting 01/15/2014, Until Discontinued, Print     !! - Potential duplicate medications found. Please discuss with provider.     01/15/14 0000  ondansetron (ZOFRAN) 4 MG tablet Every 6 hours Discontinue Reprint 01/15/14 0224   01/15/14 0000  ferrous sulfate 325 (65 FE) MG tablet Daily Discontinue Reprint 01/15/14 0224         Britt Bottom, NP 01/15/14 1721  Ezequiel Essex, MD 01/16/14 [phone]

## 2014-01-14 NOTE — ED Notes (Addendum)
Pt reports nausea x3 days.  Pt reports h/a (x2days) , backpain (x1.5 weeks), confusion.  Pt states that she forgets her train of thought.  Pt alert and oriented. Pt also reports burning upon urination. Pt has epigastric pain, last bm today, diarrhea. Pt reports emesis starting today.  Pt reports hypothyroidism and has not been taking synthroid as directed.

## 2014-01-14 NOTE — ED Notes (Signed)
Pt placed in a gown with blood pressure cuff on and pulse on

## 2014-01-15 LAB — WET PREP, GENITAL
TRICH WET PREP: NONE SEEN
Yeast Wet Prep HPF POC: NONE SEEN

## 2014-01-15 MED ORDER — METRONIDAZOLE 500 MG PO TABS: 500.0000 mg | ORAL_TABLET | Freq: Two times a day (BID) | ORAL | Status: DC

## 2014-01-15 MED ORDER — FERROUS SULFATE 325 (65 FE) MG PO TABS: 325.0000 mg | ORAL_TABLET | Freq: Every day | ORAL | Status: DC

## 2014-01-15 MED ORDER — ONDANSETRON HCL 4 MG PO TABS: 4.0000 mg | ORAL_TABLET | Freq: Four times a day (QID) | ORAL | Status: DC

## 2014-01-15 NOTE — Discharge Instructions (Signed)
Please follow the directions provided.  Follow-up with your primary care provider to ensure you are getting better.  Take your medicines as directed.   Don't hesitate to return for any new, worsening or concerning symptoms.     SEEK IMMEDIATE MEDICAL CARE IF:  You are unable to keep fluids down.  You do not urinate at least once every 6 to 8 hours.  You develop shortness of breath.  You notice blood in your stool or vomit. This may look like coffee grounds.  You have abdominal pain that increases or is concentrated in one small area (localized).  You have persistent vomiting or diarrhea.  You have a fever.

## 2014-01-16 LAB — GC/CHLAMYDIA PROBE AMP
CT Probe RNA: NEGATIVE
GC Probe RNA: NEGATIVE

## 2014-02-02 ENCOUNTER — Encounter (HOSPITAL_COMMUNITY): Payer: Self-pay | Admitting: Emergency Medicine

## 2014-02-02 ENCOUNTER — Emergency Department (HOSPITAL_COMMUNITY)
Admission: EM | Admit: 2014-02-02 | Discharge: 2014-02-02 | Disposition: A | Discharge status: Home / Self Care | Payer: PRIVATE HEALTH INSURANCE | Attending: Emergency Medicine | Admitting: Emergency Medicine

## 2014-02-02 DIAGNOSIS — D509 Iron deficiency anemia, unspecified: Secondary | ICD-10-CM | POA: Insufficient documentation

## 2014-02-02 DIAGNOSIS — R112 Nausea with vomiting, unspecified: Secondary | ICD-10-CM

## 2014-02-02 DIAGNOSIS — J45909 Unspecified asthma, uncomplicated: Secondary | ICD-10-CM | POA: Insufficient documentation

## 2014-02-02 DIAGNOSIS — B349 Viral infection, unspecified: Secondary | ICD-10-CM | POA: Insufficient documentation

## 2014-02-02 DIAGNOSIS — Z91040 Latex allergy status: Secondary | ICD-10-CM | POA: Insufficient documentation

## 2014-02-02 DIAGNOSIS — F419 Anxiety disorder, unspecified: Secondary | ICD-10-CM | POA: Insufficient documentation

## 2014-02-02 DIAGNOSIS — E039 Hypothyroidism, unspecified: Secondary | ICD-10-CM | POA: Insufficient documentation

## 2014-02-02 DIAGNOSIS — Z8679 Personal history of other diseases of the circulatory system: Secondary | ICD-10-CM | POA: Insufficient documentation

## 2014-02-02 DIAGNOSIS — Z880 Allergy status to penicillin: Secondary | ICD-10-CM | POA: Insufficient documentation

## 2014-02-02 DIAGNOSIS — F329 Major depressive disorder, single episode, unspecified: Secondary | ICD-10-CM | POA: Insufficient documentation

## 2014-02-02 DIAGNOSIS — K219 Gastro-esophageal reflux disease without esophagitis: Secondary | ICD-10-CM | POA: Insufficient documentation

## 2014-02-02 DIAGNOSIS — R1030 Lower abdominal pain, unspecified: Secondary | ICD-10-CM

## 2014-02-02 DIAGNOSIS — Z79899 Other long term (current) drug therapy: Secondary | ICD-10-CM | POA: Insufficient documentation

## 2014-02-02 DIAGNOSIS — M419 Scoliosis, unspecified: Secondary | ICD-10-CM | POA: Insufficient documentation

## 2014-02-02 DIAGNOSIS — R197 Diarrhea, unspecified: Secondary | ICD-10-CM

## 2014-02-02 DIAGNOSIS — R531 Weakness: Secondary | ICD-10-CM | POA: Insufficient documentation

## 2014-02-02 DIAGNOSIS — Z3202 Encounter for pregnancy test, result negative: Secondary | ICD-10-CM | POA: Insufficient documentation

## 2014-02-02 DIAGNOSIS — J039 Acute tonsillitis, unspecified: Secondary | ICD-10-CM | POA: Insufficient documentation

## 2014-02-02 LAB — URINALYSIS, ROUTINE W REFLEX MICROSCOPIC
Bilirubin Urine: NEGATIVE
Glucose, UA: NEGATIVE mg/dL
Ketones, ur: 15 mg/dL — AB
Nitrite: NEGATIVE
PH: 6 (ref 5.0–8.0)
PROTEIN: 30 mg/dL — AB
SPECIFIC GRAVITY, URINE: 1.024 (ref 1.005–1.030)
Urobilinogen, UA: 1 mg/dL (ref 0.0–1.0)

## 2014-02-02 LAB — URINE MICROSCOPIC-ADD ON

## 2014-02-02 LAB — COMPREHENSIVE METABOLIC PANEL
ALBUMIN: 3.6 g/dL (ref 3.5–5.2)
ALT: 17 U/L (ref 0–35)
AST: 21 U/L (ref 0–37)
Alkaline Phosphatase: 77 U/L (ref 39–117)
Anion gap: 8 (ref 5–15)
BUN: 6 mg/dL (ref 6–23)
CHLORIDE: 107 meq/L (ref 96–112)
CO2: 21 mmol/L (ref 19–32)
CREATININE: 0.66 mg/dL (ref 0.50–1.10)
Calcium: 8.8 mg/dL (ref 8.4–10.5)
GFR calc Af Amer: >90 mL/min (ref >90)
GFR calc non Af Amer: >90 mL/min (ref >90)
Glucose, Bld: 111 mg/dL — ABNORMAL HIGH (ref 70–99)
Potassium: 4 mmol/L (ref 3.5–5.1)
SODIUM: 136 mmol/L (ref 135–145)
Total Bilirubin: 1 mg/dL (ref 0.3–1.2)
Total Protein: 7.1 g/dL (ref 6.0–8.3)

## 2014-02-02 LAB — CBC WITH DIFFERENTIAL/PLATELET
Basophils Absolute: 0 10*3/uL (ref 0.0–0.1)
Basophils Relative: 0 % (ref 0–1)
Eosinophils Absolute: 0 10*3/uL (ref 0.0–0.7)
Eosinophils Relative: 0 % (ref 0–5)
HCT: 36.2 % (ref 36.0–46.0)
Hemoglobin: 11.1 g/dL — ABNORMAL LOW (ref 12.0–15.0)
LYMPHS ABS: 0.6 10*3/uL — AB (ref 0.7–4.0)
Lymphocytes Relative: 4 % — ABNORMAL LOW (ref 12–46)
MCH: 24.3 pg — ABNORMAL LOW (ref 26.0–34.0)
MCHC: 30.7 g/dL (ref 30.0–36.0)
MCV: 79.2 fL (ref 78.0–100.0)
Monocytes Absolute: 0.6 10*3/uL (ref 0.1–1.0)
Monocytes Relative: 5 % (ref 3–12)
NEUTROS ABS: 12.5 10*3/uL — AB (ref 1.7–7.7)
Neutrophils Relative %: 91 % — ABNORMAL HIGH (ref 43–77)
PLATELETS: 317 10*3/uL (ref 150–400)
RBC: 4.57 MIL/uL (ref 3.87–5.11)
RDW: 17.2 % — AB (ref 11.5–15.5)
WBC: 13.7 10*3/uL — ABNORMAL HIGH (ref 4.0–10.5)

## 2014-02-02 LAB — I-STAT CG4 LACTIC ACID, ED: LACTIC ACID, VENOUS: 1.59 mmol/L (ref 0.5–2.2)

## 2014-02-02 LAB — RAPID STREP SCREEN (MED CTR MEBANE ONLY): STREPTOCOCCUS, GROUP A SCREEN (DIRECT): NEGATIVE

## 2014-02-02 LAB — POC URINE PREG, ED: PREG TEST UR: NEGATIVE

## 2014-02-02 LAB — MONONUCLEOSIS SCREEN: MONO SCREEN: NEGATIVE

## 2014-02-02 MED ORDER — SODIUM CHLORIDE 0.9 % IV BOLUS (SEPSIS)
500.0000 mL | Freq: Once | INTRAVENOUS | Status: AC
  Administered 2014-02-02: 500 mL via INTRAVENOUS

## 2014-02-02 MED ORDER — PENICILLIN G BENZATHINE 1200000 UNIT/2ML IM SUSP
1.2000 10*6.[IU] | Freq: Once | INTRAMUSCULAR | Status: AC
  Administered 2014-02-02: 1.2 10*6.[IU] via INTRAMUSCULAR
  Filled 2014-02-02: qty 2

## 2014-02-02 MED ORDER — ACETAMINOPHEN 325 MG PO TABS
650.0000 mg | ORAL_TABLET | Freq: Once | ORAL | Status: AC
  Administered 2014-02-02: 650 mg via ORAL
  Filled 2014-02-02: qty 2

## 2014-02-02 MED ORDER — SODIUM CHLORIDE 0.9 % IV BOLUS (SEPSIS)
1000.0000 mL | Freq: Once | INTRAVENOUS | Status: AC
  Administered 2014-02-02: 1000 mL via INTRAVENOUS

## 2014-02-02 MED ORDER — IBUPROFEN 800 MG PO TABS: 800.0000 mg | ORAL_TABLET | Freq: Three times a day (TID) | ORAL | Status: DC

## 2014-02-02 MED ORDER — IBUPROFEN 800 MG PO TABS
800.0000 mg | ORAL_TABLET | Freq: Once | ORAL | Status: AC
  Administered 2014-02-02: 800 mg via ORAL
  Filled 2014-02-02: qty 1

## 2014-02-02 MED ORDER — ONDANSETRON 4 MG PO TBDP: ORAL_TABLET | ORAL | Status: DC

## 2014-02-02 NOTE — ED Notes (Signed)
Pt c/o weakness, N/V onset yesterday. Pt is found to have fever in triage. Pt denies exposure to others with illness.

## 2014-02-02 NOTE — ED Provider Notes (Signed)
CSN: [id number]     Arrival date & time 02/02/14  1035 History   First MD Initiated Contact with Patient 02/02/14 1154     Chief Complaint  Patient presents with  . Weakness  . Nausea  . Emesis     (Consider location/radiation/quality/duration/timing/severity/associated sxs/prior Treatment) HPI Comments: 24 year old female with a past medical history of schizophrenia, migraines, iron deficiency anemia, asthma, hypothyroidism, depression, anxiety and gastric reflux presenting with sore throat, generalized weakness, generalized abdominal pain, nausea, vomiting and diarrhea 1 day. Patient reports when she got home from work yesterday evening she started to feel bad, felt very warm and had a subjective fever. She got in the hot bath started to feel a little better. States her throat feels swollen, worse with swallowing. Admits to associated nausea with about 10 episodes of nonbloody, nonbilious vomiting and diarrhea. She took an ibuprofen at 3:00 AM today with relief. Denies any urinary or GYN symptoms. No sick contacts.  Patient is a 24 y.o. female presenting with weakness and vomiting. The history is provided by the patient and a parent.  Weakness  Emesis   Past Medical History  Diagnosis Date  . Gastric reflux   . Anxiety   . Depression   . Scoliosis   . 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 Complication of anesthesia     "takes alot to put me to sleep; woke up completely mid endoscopy" (11/16/2012)  . Asthma     "grew out of it" (11/16/2012)  . Iron deficiency anemia   . Migraines     "weekly" (11/16/2012)  . Schizophrenia     "moderate" (11/16/2012)  . Dysrhythmia     ST (11/16/2012)   Past Surgical History  Procedure Laterality Date  . Upper gi endoscopy  ~ 2006; ~2008   Family History  Problem Relation Age of Onset  . Thyroid disease Maternal Grandmother   . Diabetes Mother   . Hypertension Mother   . Vision loss Mother   . Hypertension Father    . Hyperlipidemia Father    History  Substance Use Topics  . Smoking status: Never Smoker   . Smokeless tobacco: Never Used  . Alcohol Use: No   OB History    Gravida Para Term Preterm AB TAB SAB Ectopic Multiple Living   0              Review of Systems  10 Systems reviewed and are negative for acute change except as noted in the HPI.  Allergies  Abilify; Amoxicillin-pot clavulanate; Cephalexin; Latex; Tape; Zicam cold remedy; and Vicodin  Home Medications   Prior to Admission medications   Medication Sig Start Date End Date Taking? Authorizing Provider  ferrous sulfate 325 (65 FE) MG tablet Take 1 tablet (325 mg total) by mouth daily. 01/15/14  Yes Britt Bottom, NP  FLUoxetine (PROZAC) 20 MG capsule Take 1 capsule (20 mg total) by mouth 2 (two) times daily. 10/23/13  Yes Lance Bosch, NP  levothyroxine (SYNTHROID) 150 MCG tablet Take 1 tablet (150 mcg total) by mouth daily before breakfast. 10/23/13  Yes Lance Bosch, NP  ondansetron (ZOFRAN) 4 MG tablet Take 1 tablet (4 mg total) by mouth every 6 (six) hours. 01/15/14  Yes Britt Bottom, NP  esomeprazole (NEXIUM) 40 MG capsule Take 1 capsule (40 mg total) by mouth daily. Patient not taking: Reported on 02/02/2014 10/23/13   Lance Bosch, NP  ibuprofen (ADVIL,MOTRIN) 800 MG tablet Take 1 tablet (800 mg total)  by mouth 3 (three) times daily. 02/02/14   Lavonna Lampron M Jaidynn Balster, PA-C  metroNIDAZOLE (FLAGYL) 500 MG tablet Take 1 tablet (500 mg total) by mouth 3 (three) times daily. Patient not taking: Reported on 02/02/2014 08/23/13   Johnna Acosta, MD  metroNIDAZOLE (FLAGYL) 500 MG tablet Take 1 tablet (500 mg total) by mouth 2 (two) times daily. Patient not taking: Reported on 02/02/2014 01/15/14   Britt Bottom, NP  ondansetron [facility] ODT) 4 MG disintegrating tablet 4mg  ODT q4 hours prn nausea/vomit 02/02/14   Amneet Cendejas M Liller Yohn, PA-C  promethazine (PHENERGAN) 25 MG tablet Take 1 tablet (25 mg total) by mouth every 6 (six) hours  as needed for nausea or vomiting. Patient not taking: Reported on 02/02/2014 08/23/13   Johnna Acosta, MD   BP 118/59 mmHg  Pulse 99  Temp(Src) 99.2 F (37.3 C) (Oral)  Resp 19  Ht 5\' 4"  (1.626 m)  Wt 245 lb (111.131 kg)  BMI 42.03 kg/m2  SpO2 96%  LMP 01/19/2014 Physical Exam  Constitutional: She is oriented to person, place, and time. She appears well-developed and well-nourished. No distress.  HENT:  Head: Normocephalic and atraumatic.  Tonsils enlarged and inflamed +2 BL without exudate.  Eyes: Conjunctivae are normal.  Neck: Normal range of motion. Neck supple.  Cardiovascular: Normal rate, regular rhythm and normal heart sounds.   Pulmonary/Chest: Effort normal and breath sounds normal.  Abdominal: Soft. Normal appearance and bowel sounds are normal. She exhibits no distension. There is tenderness. There is no rigidity, no rebound and no guarding.  Mild TTP across lower abdomen. No peritoneal signs.  Musculoskeletal: Normal range of motion. She exhibits no edema.  Lymphadenopathy:    She has cervical adenopathy.       Right cervical: Superficial cervical adenopathy present.       Left cervical: Superficial cervical adenopathy present.  Neurological: She is alert and oriented to person, place, and time.  Skin: Skin is warm and dry. She is not diaphoretic.  Psychiatric: She has a normal mood and affect. Her behavior is normal.    ED Course  Procedures (including critical care time) Labs Review Labs Reviewed  CBC WITH DIFFERENTIAL - Abnormal; Notable for the following:    WBC 13.7 (*)    Hemoglobin 11.1 (*)    MCH 24.3 (*)    RDW 17.2 (*)    Neutrophils Relative % 91 (*)    Neutro Abs 12.5 (*)    Lymphocytes Relative 4 (*)    Lymphs Abs 0.6 (*)    All other components within normal limits  COMPREHENSIVE METABOLIC PANEL - Abnormal; Notable for the following:    Glucose, Bld 111 (*)    All other components within normal limits  URINALYSIS, ROUTINE W REFLEX  MICROSCOPIC - Abnormal; Notable for the following:    Color, Urine AMBER (*)    APPearance CLOUDY (*)    Hgb urine dipstick SMALL (*)    Ketones, ur 15 (*)    Protein, ur 30 (*)    Leukocytes, UA SMALL (*)    All other components within normal limits  URINE MICROSCOPIC-ADD ON - Abnormal; Notable for the following:    Squamous Epithelial / LPF MANY (*)    Bacteria, UA MANY (*)    All other components within normal limits  RAPID STREP SCREEN  CULTURE, BLOOD (ROUTINE X 2)  CULTURE, BLOOD (ROUTINE X 2)  CULTURE, GROUP A STREP  MONONUCLEOSIS SCREEN  I-STAT CG4 LACTIC ACID, ED  POC URINE PREG,  ED    Imaging Review No results found.   EKG Interpretation None      MDM   Final diagnoses:  Tonsillitis  Lower abdominal pain  Diarrhea  Viral syndrome  Non-intractable vomiting with nausea, vomiting of unspecified type   Pt non-toxic and non-septic appearing, NAD. Temp 102.6 on arrival with HR 153. Vitals otherwise stable. WBC 13.7. Rapid strep negative, however meets 4/4 centor criteria for strep treatment. No abscess. Treat with bicillin IM. Regarding abdominal pain, mild tenderness across lower abdomen without peritoneal signs, associated nausea and vomiting. No vomiting in the ED. Urinalysis contaminated with squamous cells. Doubt UTI, no symptoms. Culture pending. Blood cultures drawn due to pt meeting two SIRS criteria. After receiving IV fluids, ibuprofen and tylenol, pt reports she feels much better. Vitals improved. Temp 99.2, HR 99. Tolerating PO. Advised her to rest and stay well hydrated, f/u with PCP. Stable for d/c. Return precautions given. Patient states understanding of treatment care plan and is agreeable.  Carman Ching, PA-C 02/02/14 1417  Dot Lanes, MD 02/03/14 [phone]

## 2014-02-02 NOTE — Discharge Instructions (Signed)
Take ibuprofen as directed for pain and fever. You may also take tylenol. Take zofran as directed as needed for nausea. Rest and stay well hydrated.  Tonsillitis Tonsillitis is an infection of the throat that causes the tonsils to become red, tender, and swollen. Tonsils are collections of lymphoid tissue at the back of the throat. Each tonsil has crevices (crypts). Tonsils help fight nose and throat infections and keep infection from spreading to other parts of the body for the first 18 months of life.  CAUSES Sudden (acute) tonsillitis is usually caused by infection with streptococcal bacteria. Long-lasting (chronic) tonsillitis occurs when the crypts of the tonsils become filled with pieces of food and bacteria, which makes it easy for the tonsils to become repeatedly infected. SYMPTOMS  Symptoms of tonsillitis include:  A sore throat, with possible difficulty swallowing.  White patches on the tonsils.  Fever.  Tiredness.  New episodes of snoring during sleep, when you did not snore before.  Small, foul-smelling, yellowish-white pieces of material (tonsilloliths) that you occasionally cough up or spit out. The tonsilloliths can also cause you to have bad breath. DIAGNOSIS Tonsillitis can be diagnosed through a physical exam. Diagnosis can be confirmed with the results of lab tests, including a throat culture. TREATMENT  The goals of tonsillitis treatment include the reduction of the severity and duration of symptoms and prevention of associated conditions. Symptoms of tonsillitis can be improved with the use of steroids to reduce the swelling. Tonsillitis caused by bacteria can be treated with antibiotic medicines. Usually, treatment with antibiotic medicines is started before the cause of the tonsillitis is known. However, if it is determined that the cause is not bacterial, antibiotic medicines will not treat the tonsillitis. If attacks of tonsillitis are severe and frequent, your health  care provider may recommend surgery to remove the tonsils (tonsillectomy). HOME CARE INSTRUCTIONS   Rest as much as possible and get plenty of sleep.  Drink plenty of fluids. While the throat is very sore, eat soft foods or liquids, such as sherbet, soups, or instant breakfast drinks.  Eat frozen ice pops.  Gargle with a warm or cold liquid to help soothe the throat. Mix 1/4 teaspoon of salt and 1/4 teaspoon of baking soda in 8 oz of water. SEEK MEDICAL CARE IF:   Large, tender lumps develop in your neck.  A rash develops.  A green, yellow-brown, or bloody substance is coughed up.  You are unable to swallow liquids or food for 24 hours.  You notice that only one of the tonsils is swollen. SEEK IMMEDIATE MEDICAL CARE IF:   You develop any new symptoms such as vomiting, severe headache, stiff neck, chest pain, or trouble breathing or swallowing.  You have severe throat pain along with drooling or voice changes.  You have severe pain, unrelieved with recommended medications.  You are unable to fully open the mouth.  You develop redness, swelling, or severe pain anywhere in the neck.  You have a fever. MAKE SURE YOU:   Understand these instructions.  Will watch your condition.  Will get help right away if you are not doing well or get worse. Document Released: 10/15/2004 Document Revised: 05/22/2013 Document Reviewed: 06/24/2012 [facility] Patient Information 2015 [place], [place]. This information is not intended to replace advice given to you by your health care provider. Make sure you discuss any questions you have with your health care provider.  Diarrhea Diarrhea is frequent loose and watery bowel movements. It can cause you to feel  weak and dehydrated. Dehydration can cause you to become tired and thirsty, have a dry mouth, and have decreased urination that often is dark yellow. Diarrhea is a sign of another problem, most often an infection that will not last long. In most  cases, diarrhea typically lasts 2-3 days. However, it can last longer if it is a sign of something more serious. It is important to treat your diarrhea as directed by your caregiver to lessen or prevent future episodes of diarrhea. CAUSES  Some common causes include:  Gastrointestinal infections caused by viruses, bacteria, or parasites.  Food poisoning or food allergies.  Certain medicines, such as antibiotics, chemotherapy, and laxatives.  Artificial sweeteners and fructose.  Digestive disorders. HOME CARE INSTRUCTIONS  Ensure adequate fluid intake (hydration): Have 1 cup (8 oz) of fluid for each diarrhea episode. Avoid fluids that contain simple sugars or sports drinks, fruit juices, whole milk products, and sodas. Your urine should be clear or pale yellow if you are drinking enough fluids. Hydrate with an oral rehydration solution that you can purchase at pharmacies, retail stores, and online. You can prepare an oral rehydration solution at home by mixing the following ingredients together:   - tsp table salt.   tsp baking soda.   tsp salt substitute containing potassium chloride.  1  tablespoons sugar.  1 L (34 oz) of water.  Certain foods and beverages may increase the speed at which food moves through the gastrointestinal (GI) tract. These foods and beverages should be avoided and include:  Caffeinated and alcoholic beverages.  High-fiber foods, such as raw fruits and vegetables, nuts, seeds, and whole grain breads and cereals.  Foods and beverages sweetened with sugar alcohols, such as xylitol, sorbitol, and mannitol.  Some foods may be well tolerated and may help thicken stool including:  Starchy foods, such as rice, toast, pasta, low-sugar cereal, oatmeal, grits, baked potatoes, crackers, and bagels.  Bananas.  Applesauce.  Add probiotic-rich foods to help increase healthy bacteria in the GI tract, such as yogurt and fermented milk products.  Wash your hands  well after each diarrhea episode.  Only take over-the-counter or prescription medicines as directed by your caregiver.  Take a warm bath to relieve any burning or pain from frequent diarrhea episodes. SEEK IMMEDIATE MEDICAL CARE IF:   You are unable to keep fluids down.  You have persistent vomiting.  You have blood in your stool, or your stools are black and tarry.  You do not urinate in 6-8 hours, or there is only a small amount of very dark urine.  You have abdominal pain that increases or localizes.  You have weakness, dizziness, confusion, or light-headedness.  You have a severe headache.  Your diarrhea gets worse or does not get better.  You have a fever or persistent symptoms for more than 2-3 days.  You have a fever and your symptoms suddenly get worse. MAKE SURE YOU:   Understand these instructions.  Will watch your condition.  Will get help right away if you are not doing well or get worse. Document Released: 12/26/2001 Document Revised: 05/22/2013 Document Reviewed: 09/13/2011 [facility] Patient Information 2015 [place], [place]. This information is not intended to replace advice given to you by your health care provider. Make sure you discuss any questions you have with your health care provider.  Nausea and Vomiting Nausea is a sick feeling that often comes before throwing up (vomiting). Vomiting is a reflex where stomach contents come out of your mouth. Vomiting can cause  severe loss of body fluids (dehydration). Children and elderly adults can become dehydrated quickly, especially if they also have diarrhea. Nausea and vomiting are symptoms of a condition or disease. It is important to find the cause of your symptoms. CAUSES   Direct irritation of the stomach lining. This irritation can result from increased acid production (gastroesophageal reflux disease), infection, food poisoning, taking certain medicines (such as nonsteroidal anti-inflammatory drugs), alcohol  use, or tobacco use.  Signals from the brain.These signals could be caused by a headache, heat exposure, an inner ear disturbance, increased pressure in the brain from injury, infection, a tumor, or a concussion, pain, emotional stimulus, or metabolic problems.  An obstruction in the gastrointestinal tract (bowel obstruction).  Illnesses such as diabetes, hepatitis, gallbladder problems, appendicitis, kidney problems, cancer, sepsis, atypical symptoms of a heart attack, or eating disorders.  Medical treatments such as chemotherapy and radiation.  Receiving medicine that makes you sleep (general anesthetic) during surgery. DIAGNOSIS Your caregiver may ask for tests to be done if the problems do not improve after a few days. Tests may also be done if symptoms are severe or if the reason for the nausea and vomiting is not clear. Tests may include:  Urine tests.  Blood tests.  Stool tests.  Cultures (to look for evidence of infection).  X-rays or other imaging studies. Test results can help your caregiver make decisions about treatment or the need for additional tests. TREATMENT You need to stay well hydrated. Drink frequently but in small amounts.You may wish to drink water, sports drinks, clear broth, or eat frozen ice pops or gelatin dessert to help stay hydrated.When you eat, eating slowly may help prevent nausea.There are also some antinausea medicines that may help prevent nausea. HOME CARE INSTRUCTIONS   Take all medicine as directed by your caregiver.  If you do not have an appetite, do not force yourself to eat. However, you must continue to drink fluids.  If you have an appetite, eat a normal diet unless your caregiver tells you differently.  Eat a variety of complex carbohydrates (rice, wheat, potatoes, bread), lean meats, yogurt, fruits, and vegetables.  Avoid high-fat foods because they are more difficult to digest.  Drink enough water and fluids to keep your urine  clear or pale yellow.  If you are dehydrated, ask your caregiver for specific rehydration instructions. Signs of dehydration may include:  Severe thirst.  Dry lips and mouth.  Dizziness.  Dark urine.  Decreasing urine frequency and amount.  Confusion.  Rapid breathing or pulse. SEEK IMMEDIATE MEDICAL CARE IF:   You have blood or brown flecks (like coffee grounds) in your vomit.  You have black or bloody stools.  You have a severe headache or stiff neck.  You are confused.  You have severe abdominal pain.  You have chest pain or trouble breathing.  You do not urinate at least once every 8 hours.  You develop cold or clammy skin.  You continue to vomit for longer than 24 to 48 hours.  You have a fever. MAKE SURE YOU:   Understand these instructions.  Will watch your condition.  Will get help right away if you are not doing well or get worse. Document Released: 01/05/2005 Document Revised: 03/30/2011 Document Reviewed: 06/04/2010 [facility] Patient Information 2015 [place], [place]. This information is not intended to replace advice given to you by your health care provider. Make sure you discuss any questions you have with your health care provider. Food Choices to Help Relieve  Diarrhea When you have diarrhea, the foods you eat and your eating habits are very important. Choosing the right foods and drinks can help relieve diarrhea. Also, because diarrhea can last up to 7 days, you need to replace lost fluids and electrolytes (such as sodium, potassium, and chloride) in order to help prevent dehydration.  WHAT GENERAL GUIDELINES DO I NEED TO FOLLOW?  Slowly drink 1 cup (8 oz) of fluid for each episode of diarrhea. If you are getting enough fluid, your urine will be clear or pale yellow.  Eat starchy foods. Some good choices include white rice, white toast, pasta, low-fiber cereal, baked potatoes (without the skin), saltine crackers, and bagels.  Avoid large servings  of any cooked vegetables.  Limit fruit to two servings per day. A serving is  cup or 1 small piece.  Choose foods with less than 2 g of fiber per serving.  Limit fats to less than 8 tsp (38 g) per day.  Avoid fried foods.  Eat foods that have probiotics in them. Probiotics can be found in certain dairy products.  Avoid foods and beverages that may increase the speed at which food moves through the stomach and intestines (gastrointestinal tract). Things to avoid include:  High-fiber foods, such as dried fruit, raw fruits and vegetables, nuts, seeds, and whole grain foods.  Spicy foods and high-fat foods.  Foods and beverages sweetened with high-fructose corn syrup, honey, or sugar alcohols such as xylitol, sorbitol, and mannitol. WHAT FOODS ARE RECOMMENDED? Grains White rice. White, [place], or pita breads (fresh or toasted), including plain rolls, buns, or bagels. White pasta. Saltine, soda, or graham crackers. Pretzels. Low-fiber cereal. Cooked cereals made with water (such as cornmeal, farina, or cream cereals). Plain muffins. Matzo. Melba toast. Zwieback.  Vegetables Potatoes (without the skin). Strained tomato and vegetable juices. Most well-cooked and canned vegetables without seeds. Tender lettuce. Fruits Cooked or canned applesauce, apricots, cherries, fruit cocktail, grapefruit, peaches, pears, or plums. Fresh bananas, apples without skin, cherries, grapes, cantaloupe, grapefruit, peaches, oranges, or plums.  Meat and Other Protein Products Baked or boiled chicken. Eggs. Tofu. Fish. Seafood. Smooth peanut butter. Ground or well-cooked tender beef, ham, veal, lamb, pork, or poultry.  Dairy Plain yogurt, kefir, and unsweetened liquid yogurt. Lactose-free milk, buttermilk, or soy milk. Plain hard cheese. Beverages Sport drinks. Clear broths. Diluted fruit juices (except prune). Regular, caffeine-free sodas such as ginger ale. Water. Decaffeinated teas. Oral rehydration solutions.  Sugar-free beverages not sweetened with sugar alcohols. Other Bouillon, broth, or soups made from recommended foods.  The items listed above may not be a complete list of recommended foods or beverages. Contact your dietitian for more options. WHAT FOODS ARE NOT RECOMMENDED? Grains Whole grain, whole wheat, bran, or rye breads, rolls, pastas, crackers, and cereals. Wild or brown rice. Cereals that contain more than 2 g of fiber per serving. Corn tortillas or taco shells. Cooked or dry oatmeal. Granola. Popcorn. Vegetables Raw vegetables. Cabbage, broccoli, Brussels sprouts, artichokes, baked beans, beet greens, corn, kale, legumes, peas, sweet potatoes, and yams. Potato skins. Cooked spinach and cabbage. Fruits Dried fruit, including raisins and dates. Raw fruits. Stewed or dried prunes. Fresh apples with skin, apricots, mangoes, pears, raspberries, and strawberries.  Meat and Other Protein Products Chunky peanut butter. Nuts and seeds. Beans and lentils. Berniece Salines.  Dairy High-fat cheeses. Milk, chocolate milk, and beverages made with milk, such as milk shakes. Cream. Ice cream. Sweets and Desserts Sweet rolls, doughnuts, and sweet breads. Pancakes and waffles. Fats and  Oils Butter. Cream sauces. Margarine. Salad oils. Plain salad dressings. Olives. Avocados.  Beverages Caffeinated beverages (such as coffee, tea, soda, or energy drinks). Alcoholic beverages. Fruit juices with pulp. Prune juice. Soft drinks sweetened with high-fructose corn syrup or sugar alcohols. Other Coconut. Hot sauce. Chili powder. Mayonnaise. Gravy. Cream-based or milk-based soups.  The items listed above may not be a complete list of foods and beverages to avoid. Contact your dietitian for more information. WHAT SHOULD I DO IF I BECOME DEHYDRATED? Diarrhea can sometimes lead to dehydration. Signs of dehydration include dark urine and dry mouth and skin. If you think you are dehydrated, you should rehydrate with an oral  rehydration solution. These solutions can be purchased at pharmacies, retail stores, or online.  Drink -1 cup (120-240 mL) of oral rehydration solution each time you have an episode of diarrhea. If drinking this amount makes your diarrhea worse, try drinking smaller amounts more often. For example, drink 1-3 tsp (5-15 mL) every 5-10 minutes.  A general rule for staying hydrated is to drink 1-2 L of fluid per day. Talk to your health care provider about the specific amount you should be drinking each day. Drink enough fluids to keep your urine clear or pale yellow. Document Released: 03/28/2003 Document Revised: 01/10/2013 Document Reviewed: 11/28/2012 [facility] Patient Information 2015 [place], [place]. This information is not intended to replace advice given to you by your health care provider. Make sure you discuss any questions you have with your health care provider.

## 2014-02-03 LAB — CULTURE, GROUP A STREP

## 2014-02-08 LAB — CULTURE, BLOOD (ROUTINE X 2)
CULTURE: NO GROWTH
Culture: NO GROWTH

## 2014-03-03 ENCOUNTER — Emergency Department (HOSPITAL_COMMUNITY)
Admission: EM | Admit: 2014-03-03 | Discharge: 2014-03-03 | Disposition: A | Discharge status: Home / Self Care | Payer: Self-pay | Attending: Emergency Medicine | Admitting: Emergency Medicine

## 2014-03-03 ENCOUNTER — Emergency Department (HOSPITAL_COMMUNITY): Payer: Self-pay

## 2014-03-03 DIAGNOSIS — K219 Gastro-esophageal reflux disease without esophagitis: Secondary | ICD-10-CM | POA: Insufficient documentation

## 2014-03-03 DIAGNOSIS — E071 Dyshormogenetic goiter: Secondary | ICD-10-CM | POA: Insufficient documentation

## 2014-03-03 DIAGNOSIS — Z8739 Personal history of other diseases of the musculoskeletal system and connective tissue: Secondary | ICD-10-CM | POA: Insufficient documentation

## 2014-03-03 DIAGNOSIS — Y990 Civilian activity done for income or pay: Secondary | ICD-10-CM | POA: Insufficient documentation

## 2014-03-03 DIAGNOSIS — S61031A Puncture wound without foreign body of right thumb without damage to nail, initial encounter: Secondary | ICD-10-CM | POA: Insufficient documentation

## 2014-03-03 DIAGNOSIS — D509 Iron deficiency anemia, unspecified: Secondary | ICD-10-CM | POA: Insufficient documentation

## 2014-03-03 DIAGNOSIS — J45909 Unspecified asthma, uncomplicated: Secondary | ICD-10-CM | POA: Insufficient documentation

## 2014-03-03 DIAGNOSIS — Z79899 Other long term (current) drug therapy: Secondary | ICD-10-CM | POA: Insufficient documentation

## 2014-03-03 DIAGNOSIS — Z880 Allergy status to penicillin: Secondary | ICD-10-CM | POA: Insufficient documentation

## 2014-03-03 DIAGNOSIS — W540XXA Bitten by dog, initial encounter: Secondary | ICD-10-CM | POA: Insufficient documentation

## 2014-03-03 DIAGNOSIS — Z91040 Latex allergy status: Secondary | ICD-10-CM | POA: Insufficient documentation

## 2014-03-03 DIAGNOSIS — Y9289 Other specified places as the place of occurrence of the external cause: Secondary | ICD-10-CM | POA: Insufficient documentation

## 2014-03-03 DIAGNOSIS — Z23 Encounter for immunization: Secondary | ICD-10-CM | POA: Insufficient documentation

## 2014-03-03 DIAGNOSIS — F419 Anxiety disorder, unspecified: Secondary | ICD-10-CM | POA: Insufficient documentation

## 2014-03-03 DIAGNOSIS — Y9389 Activity, other specified: Secondary | ICD-10-CM | POA: Insufficient documentation

## 2014-03-03 DIAGNOSIS — Z8679 Personal history of other diseases of the circulatory system: Secondary | ICD-10-CM | POA: Insufficient documentation

## 2014-03-03 DIAGNOSIS — F209 Schizophrenia, unspecified: Secondary | ICD-10-CM | POA: Insufficient documentation

## 2014-03-03 DIAGNOSIS — Z791 Long term (current) use of non-steroidal anti-inflammatories (NSAID): Secondary | ICD-10-CM | POA: Insufficient documentation

## 2014-03-03 DIAGNOSIS — F329 Major depressive disorder, single episode, unspecified: Secondary | ICD-10-CM | POA: Insufficient documentation

## 2014-03-03 MED ORDER — DOXYCYCLINE HYCLATE 100 MG PO CAPS: 100.0000 mg | ORAL_CAPSULE | Freq: Two times a day (BID) | ORAL | Status: DC

## 2014-03-03 MED ORDER — TETANUS-DIPHTH-ACELL PERTUSSIS 5-2.5-18.5 LF-MCG/0.5 IM SUSP
0.5000 mL | Freq: Once | INTRAMUSCULAR | Status: AC
  Administered 2014-03-03: 0.5 mL via INTRAMUSCULAR
  Filled 2014-03-03: qty 0.5

## 2014-03-03 MED ORDER — OXYCODONE-ACETAMINOPHEN 5-325 MG PO TABS: 1.0000 | ORAL_TABLET | Freq: Four times a day (QID) | ORAL | Status: DC | PRN

## 2014-03-03 NOTE — ED Provider Notes (Signed)
CSN: [id number]     Arrival date & time 03/03/14  1310 History  This chart was scribed for non-physician practitioner, Clayton Bibles, PA-C working with Jasper Riling. Alvino Chapel, MD by Evelene Croon, ED Scribe. This patient was seen in room TR08C/TR08C and the patient's care was started at 2:29 PM.    Chief Complaint  Patient presents with  . Animal Bite    The history is provided by the patient. No language interpreter was used.     HPI Comments:  Kimberly [name] is a 24 y.o. female who presents to the Emergency Department complaining of a wound to her right thumb after being bitten by a dog at the [employer] where she works [employer]. She reports mild-moderate pain to the area that she describes as a soreness and a tingling sensation to her thumb. She states the dogs shots are UTD. She is unsure of the date of her last tetanus. No alleviating factors or associated symptoms noted.   Past Medical History  Diagnosis Date  . Gastric reflux   . Anxiety   . Depression   . Scoliosis   . 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 Complication of anesthesia     "takes alot to put me to sleep; woke up completely mid endoscopy" (11/16/2012)  . Asthma     "grew out of it" (11/16/2012)  . Iron deficiency anemia   . Migraines     "weekly" (11/16/2012)  . Schizophrenia     "moderate" (11/16/2012)  . Dysrhythmia     ST (11/16/2012)   Past Surgical History  Procedure Laterality Date  . Upper gi endoscopy  ~ 2006; ~2008   Family History  Problem Relation Age of Onset  . Thyroid disease Maternal Grandmother   . Diabetes Mother   . Hypertension Mother   . Vision loss Mother   . Hypertension Father   . Hyperlipidemia Father    History  Substance Use Topics  . Smoking status: Never Smoker   . Smokeless tobacco: Never Used  . Alcohol Use: No   OB History    Gravida Para Term Preterm AB TAB SAB Ectopic Multiple Living   0              Review of Systems  Constitutional: Negative for  fever and chills.  Skin: Positive for wound. Negative for color change.  Allergic/Immunologic: Negative for immunocompromised state.  Neurological: Positive for numbness. Negative for weakness.  Hematological: Does not bruise/bleed easily.  Psychiatric/Behavioral: Negative for self-injury.      Allergies  Abilify; Amoxicillin-pot clavulanate; Cephalexin; Latex; Tape; Zicam cold remedy; and Vicodin  Home Medications   Prior to Admission medications   Medication Sig Start Date End Date Taking? Authorizing Provider  esomeprazole (NEXIUM) 40 MG capsule Take 1 capsule (40 mg total) by mouth daily. Patient not taking: Reported on 02/02/2014 10/23/13   Lance Bosch, NP  ferrous sulfate 325 (65 FE) MG tablet Take 1 tablet (325 mg total) by mouth daily. 01/15/14   Britt Bottom, NP  FLUoxetine (PROZAC) 20 MG capsule Take 1 capsule (20 mg total) by mouth 2 (two) times daily. 10/23/13   Lance Bosch, NP  ibuprofen (ADVIL,MOTRIN) 800 MG tablet Take 1 tablet (800 mg total) by mouth 3 (three) times daily. 02/02/14   Carman Ching, PA-C  levothyroxine (SYNTHROID) 150 MCG tablet Take 1 tablet (150 mcg total) by mouth daily before breakfast. 10/23/13   Lance Bosch, NP  metroNIDAZOLE (FLAGYL) 500 MG tablet  Take 1 tablet (500 mg total) by mouth 3 (three) times daily. Patient not taking: Reported on 02/02/2014 08/23/13   Johnna Acosta, MD  metroNIDAZOLE (FLAGYL) 500 MG tablet Take 1 tablet (500 mg total) by mouth 2 (two) times daily. Patient not taking: Reported on 02/02/2014 01/15/14   Britt Bottom, NP  ondansetron (ZOFRAN ODT) 4 MG disintegrating tablet 4mg  ODT q4 hours prn nausea/vomit 02/02/14   Robyn M Hess, PA-C  ondansetron (ZOFRAN) 4 MG tablet Take 1 tablet (4 mg total) by mouth every 6 (six) hours. 01/15/14   Britt Bottom, NP  promethazine (PHENERGAN) 25 MG tablet Take 1 tablet (25 mg total) by mouth every 6 (six) hours as needed for nausea or vomiting. Patient not taking: Reported  on 02/02/2014 08/23/13   Johnna Acosta, MD   BP 118/83 mmHg  Pulse 114  Temp(Src) 98.6 F (37 C) (Oral)  Resp 16  SpO2 96%  LMP 02/10/2014 Physical Exam  Constitutional: She appears well-developed and well-nourished. No distress.  HENT:  Head: Normocephalic and atraumatic.  Neck: Neck supple.  Pulmonary/Chest: Effort normal.  Neurological: She is alert.  Skin: She is not diaphoretic.  Right dorsal thumb with areas of abrasions and avulsed skin to dorsal aspect;There is a puncture wound to the palmar aspect of right thumb; Cap refill less than 2 seconds. FAROM of thumb; No erythema/edema/discharge  Nursing note and vitals reviewed.   ED Course  Procedures   DIAGNOSTIC STUDIES:  Oxygen Saturation is 96% on RA, normal by my interpretation.    COORDINATION OF CARE:  2:32 PM Will order tetanus. Discussed treatment plan with pt at bedside and pt agreed to plan.  Labs Review Labs Reviewed - No data to display  Imaging Review Dg Finger Thumb Right  03/03/2014   CLINICAL DATA:  24 year old female with right thumb pain status post dog bite  EXAM: RIGHT THUMB 2+V  COMPARISON:  None.  FINDINGS: There is no evidence of fracture or dislocation. There is no evidence of arthropathy or other focal bone abnormality. Soft tissues are unremarkable  IMPRESSION: Negative.   Electronically Signed   By: Jacqulynn Cadet M.D.   On: 03/03/2014 14:17     EKG Interpretation None      MDM   Final diagnoses:  Dog bite    Afebrile, nontoxic patient with accidental dog bite to right thumb.  Dog's vaccinations are up to date.  Neurovascularly intact.  Xray negative.  Tetanus updated.  D/C home with doxycycline (penicillin allergic), percocet (vicodin-allergic), hand surgery follow up Lenon Curt).   Discussed result, findings, treatment, and follow up  with patient.  Pt given return precautions.  Pt verbalizes understanding and agrees with plan.       I personally performed the services described in  this documentation, which was scribed in my presence. The recorded information has been reviewed and is accurate.    Clayton Bibles, PA-C 03/03/14 [place] Alvino Chapel, MD 03/04/14 [phone]

## 2014-03-03 NOTE — ED Notes (Signed)
PT was bit on finger by a dog at the [employer] office where she works. Pt verified the dog is up to date on shots.

## 2014-03-03 NOTE — ED Notes (Signed)
Declined W/C at D/C and was escorted to lobby by RN. 

## 2014-03-03 NOTE — Discharge Instructions (Signed)
Read the information below.  Use the prescribed medication as directed.  Please discuss all new medications with your pharmacist.  Do not take additional tylenol while taking the prescribed pain medication to avoid overdose.  You may return to the Emergency Department at any time for worsening condition or any new symptoms that concern you.  If you develop redness, swelling, pus draining from the wound, difficulty moving your thumb, or fevers greater than 100.4, return to the ER immediately for a recheck.     Animal Bite An animal bite can result in a scratch on the skin, deep open cut, puncture of the skin, crush injury, or tearing away of the skin or a body part. Dogs are responsible for most animal bites. Children are bitten more often than adults. An animal bite can range from very mild to more serious. A small bite from your house pet is no cause for alarm. However, some animal bites can become infected or injure a bone or other tissue. You must seek medical care if:  The skin is broken and bleeding does not slow down or stop after 15 minutes.  The puncture is deep and difficult to clean (such as a cat bite).  Pain, warmth, redness, or pus develops around the wound.  The bite is from a stray animal or rodent. There may be a risk of rabies infection.  The bite is from a snake, raccoon, skunk, fox, coyote, or bat. There may be a risk of rabies infection.  The person bitten has a chronic illness such as diabetes, liver disease, or cancer, or the person takes medicine that lowers the immune system.  There is concern about the location and severity of the bite. It is important to clean and protect an animal bite wound right away to prevent infection. Follow these steps:  Clean the wound with plenty of water and soap.  Apply an antibiotic cream.  Apply gentle pressure over the wound with a clean towel or gauze to slow or stop bleeding.  Elevate the affected area above the heart to help stop  any bleeding.  Seek medical care. Getting medical care within 8 hours of the animal bite leads to the best possible outcome. DIAGNOSIS  Your caregiver will most likely:  Take a detailed history of the animal and the bite injury.  Perform a wound exam.  Take your medical history. Blood tests or X-rays may be performed. Sometimes, infected bite wounds are cultured and sent to a lab to identify the infectious bacteria.  TREATMENT  Medical treatment will depend on the location and type of animal bite as well as the patient's medical history. Treatment may include:  Wound care, such as cleaning and flushing the wound with saline solution, bandaging, and elevating the affected area.  Antibiotics.  Tetanus immunization.  Rabies immunization.  Leaving the wound open to heal. This is often done with animal bites, due to the high risk of infection. However, in certain cases, wound closure with stitches, wound adhesive, skin adhesive strips, or staples may be used. Infected bites that are left untreated may require intravenous (IV) antibiotics and surgical treatment in the hospital. [place]  Follow your caregiver's instructions for wound care.  Take all medicines as directed.  If your caregiver prescribes antibiotics, take them as directed. Finish them even if you start to feel better.  Follow up with your caregiver for further exams or immunizations as directed. You may need a tetanus shot if:  You cannot remember when  you had your last tetanus shot.  You have never had a tetanus shot.  The injury broke your skin. If you get a tetanus shot, your arm may swell, get red, and feel warm to the touch. This is common and not a problem. If you need a tetanus shot and you choose not to have one, there is a rare chance of getting tetanus. Sickness from tetanus can be serious. SEEK MEDICAL CARE IF:  You notice warmth, redness, soreness, swelling, pus discharge, or a bad smell  coming from the wound.  You have a red line on the skin coming from the wound.  You have a fever, chills, or a general ill feeling.  You have nausea or vomiting.  You have continued or worsening pain.  You have trouble moving the injured part.  You have other questions or concerns. MAKE SURE YOU:  Understand these instructions.  Will watch your condition.  Will get help right away if you are not doing well or get worse. Document Released: 09/23/2010 Document Revised: 03/30/2011 Document Reviewed: 09/23/2010 [facility] Patient Information 2015 [place], [place]. This information is not intended to replace advice given to you by your health care provider. Make sure you discuss any questions you have with your health care provider.

## 2014-03-03 NOTE — ED Notes (Addendum)
Dog bite to ant. Side and dorsal side of rt.Thumb. 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 X 2 mild puncture wounds. Bleeding mild and controlled.

## 2014-03-04 ENCOUNTER — Emergency Department (HOSPITAL_COMMUNITY)
Admission: EM | Admit: 2014-03-04 | Discharge: 2014-03-04 | Disposition: A | Discharge status: Home / Self Care | Payer: Self-pay | Attending: Emergency Medicine | Admitting: Emergency Medicine

## 2014-03-04 ENCOUNTER — Encounter (HOSPITAL_COMMUNITY): Payer: Self-pay | Admitting: Emergency Medicine

## 2014-03-04 DIAGNOSIS — Z91040 Latex allergy status: Secondary | ICD-10-CM | POA: Insufficient documentation

## 2014-03-04 DIAGNOSIS — S61051D Open bite of right thumb without damage to nail, subsequent encounter: Secondary | ICD-10-CM | POA: Insufficient documentation

## 2014-03-04 DIAGNOSIS — F419 Anxiety disorder, unspecified: Secondary | ICD-10-CM | POA: Insufficient documentation

## 2014-03-04 DIAGNOSIS — F329 Major depressive disorder, single episode, unspecified: Secondary | ICD-10-CM | POA: Insufficient documentation

## 2014-03-04 DIAGNOSIS — J45909 Unspecified asthma, uncomplicated: Secondary | ICD-10-CM | POA: Insufficient documentation

## 2014-03-04 DIAGNOSIS — Z79899 Other long term (current) drug therapy: Secondary | ICD-10-CM | POA: Insufficient documentation

## 2014-03-04 DIAGNOSIS — Z8669 Personal history of other diseases of the nervous system and sense organs: Secondary | ICD-10-CM | POA: Insufficient documentation

## 2014-03-04 DIAGNOSIS — Z8679 Personal history of other diseases of the circulatory system: Secondary | ICD-10-CM | POA: Insufficient documentation

## 2014-03-04 DIAGNOSIS — Z791 Long term (current) use of non-steroidal anti-inflammatories (NSAID): Secondary | ICD-10-CM | POA: Insufficient documentation

## 2014-03-04 DIAGNOSIS — Z8719 Personal history of other diseases of the digestive system: Secondary | ICD-10-CM | POA: Insufficient documentation

## 2014-03-04 DIAGNOSIS — Z880 Allergy status to penicillin: Secondary | ICD-10-CM | POA: Insufficient documentation

## 2014-03-04 DIAGNOSIS — W540XXA Bitten by dog, initial encounter: Secondary | ICD-10-CM | POA: Insufficient documentation

## 2014-03-04 DIAGNOSIS — F209 Schizophrenia, unspecified: Secondary | ICD-10-CM | POA: Insufficient documentation

## 2014-03-04 DIAGNOSIS — E039 Hypothyroidism, unspecified: Secondary | ICD-10-CM | POA: Insufficient documentation

## 2014-03-04 DIAGNOSIS — S61259A Open bite of unspecified finger without damage to nail, initial encounter: Secondary | ICD-10-CM

## 2014-03-04 DIAGNOSIS — Z8739 Personal history of other diseases of the musculoskeletal system and connective tissue: Secondary | ICD-10-CM | POA: Insufficient documentation

## 2014-03-04 DIAGNOSIS — D649 Anemia, unspecified: Secondary | ICD-10-CM | POA: Insufficient documentation

## 2014-03-04 DIAGNOSIS — Z792 Long term (current) use of antibiotics: Secondary | ICD-10-CM | POA: Insufficient documentation

## 2014-03-04 MED ORDER — DOXYCYCLINE HYCLATE 100 MG PO TABS
200.0000 mg | ORAL_TABLET | Freq: Once | ORAL | Status: AC
  Administered 2014-03-04: 200 mg via ORAL
  Filled 2014-03-04: qty 2

## 2014-03-04 MED ORDER — OXYCODONE-ACETAMINOPHEN 5-325 MG PO TABS
2.0000 | ORAL_TABLET | Freq: Once | ORAL | Status: AC
  Administered 2014-03-04: 2 via ORAL
  Filled 2014-03-04: qty 2

## 2014-03-04 NOTE — ED Notes (Signed)
Pt reports here for wound check of dog bite that occurred yesterday. Pt bit on right thumb. Area red and painful. Pt cannot afford the prescribed medications. [facility].

## 2014-03-04 NOTE — ED Provider Notes (Signed)
CSN: [id number]     Arrival date & time 03/04/14  1054 History  This chart was scribed for non-physician practitioner, Fransico Meadow, PA-C, working with Dorie Rank, MD, by Ian Bushman, ED Scribe. This patient was seen in room TR09C/TR09C and the patient's care was started at 11:34 AM.     Chief Complaint  Patient presents with  . Wound Check   (Consider location/radiation/quality/duration/timing/severity/associated sxs/prior Treatment) The history is provided by the patient. No language interpreter was used.    HPI Comments: Kimberly [name] is a 24 y.o. female who presents to the Emergency Department for a wound check/constant right thumb pain.  Patient was in the ED yesterday and was given antibiotics but notes that her finger has gotten worse.  Patient works at [employer] and was bit by a dog before feeding him yesterday.  Patient has no other complaints today.    Past Medical History  Diagnosis Date  . Gastric reflux   . Anxiety   . Depression   . Scoliosis   . 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 Complication of anesthesia     "takes alot to put me to sleep; woke up completely mid endoscopy" (11/16/2012)  . Asthma     "grew out of it" (11/16/2012)  . Iron deficiency anemia   . Migraines     "weekly" (11/16/2012)  . Schizophrenia     "moderate" (11/16/2012)  . Dysrhythmia     ST (11/16/2012)   Past Surgical History  Procedure Laterality Date  . Upper gi endoscopy  ~ 2006; ~2008   Family History  Problem Relation Age of Onset  . Thyroid disease Maternal Grandmother   . Diabetes Mother   . Hypertension Mother   . Vision loss Mother   . Hypertension Father   . Hyperlipidemia Father    History  Substance Use Topics  . Smoking status: Never Smoker   . Smokeless tobacco: Never Used  . Alcohol Use: No   OB History    Gravida Para Term Preterm AB TAB SAB Ectopic Multiple Living   0              Review of Systems  Constitutional:  Negative for fever and chills.  Respiratory: Negative for cough, chest tightness and shortness of breath.   Skin: Positive for color change and wound.      Allergies  Abilify; Amoxicillin-pot clavulanate; Cephalexin; Latex; Tape; Zicam cold remedy; and Vicodin  Home Medications   Prior to Admission medications   Medication Sig Start Date End Date Taking? Authorizing Provider  doxycycline (VIBRAMYCIN) 100 MG capsule Take 1 capsule (100 mg total) by mouth 2 (two) times daily. One po bid x 5 days 03/03/14   Clayton Bibles, PA-C  esomeprazole (NEXIUM) 40 MG capsule Take 1 capsule (40 mg total) by mouth daily. Patient not taking: Reported on 02/02/2014 10/23/13   Lance Bosch, NP  ferrous sulfate 325 (65 FE) MG tablet Take 1 tablet (325 mg total) by mouth daily. 01/15/14   Britt Bottom, NP  FLUoxetine (PROZAC) 20 MG capsule Take 1 capsule (20 mg total) by mouth 2 (two) times daily. 10/23/13   Lance Bosch, NP  ibuprofen (ADVIL,MOTRIN) 800 MG tablet Take 1 tablet (800 mg total) by mouth 3 (three) times daily. 02/02/14   Carman Ching, PA-C  levothyroxine (SYNTHROID) 150 MCG tablet Take 1 tablet (150 mcg total) by mouth daily before breakfast. 10/23/13   Lance Bosch, NP  metroNIDAZOLE (  FLAGYL) 500 MG tablet Take 1 tablet (500 mg total) by mouth 3 (three) times daily. Patient not taking: Reported on 02/02/2014 08/23/13   Johnna Acosta, MD  metroNIDAZOLE (FLAGYL) 500 MG tablet Take 1 tablet (500 mg total) by mouth 2 (two) times daily. Patient not taking: Reported on 02/02/2014 01/15/14   Britt Bottom, NP  ondansetron [facility] ODT) 4 MG disintegrating tablet 4mg  ODT q4 hours prn nausea/vomit 02/02/14   Robyn M Hess, PA-C  ondansetron (ZOFRAN) 4 MG tablet Take 1 tablet (4 mg total) by mouth every 6 (six) hours. 01/15/14   Britt Bottom, NP  oxyCODONE-acetaminophen (PERCOCET/ROXICET) 5-325 MG per tablet Take 1 tablet by mouth every 6 (six) hours as needed for moderate pain or severe pain.  03/03/14   Clayton Bibles, PA-C  promethazine (PHENERGAN) 25 MG tablet Take 1 tablet (25 mg total) by mouth every 6 (six) hours as needed for nausea or vomiting. Patient not taking: Reported on 02/02/2014 08/23/13   Johnna Acosta, MD   BP 115/80 mmHg  Pulse 104  Temp(Src) 98.1 F (36.7 C) (Oral)  Resp 18  Ht 5\' 4"  (1.626 m)  Wt 245 lb (111.131 kg)  BMI 42.03 kg/m2  SpO2 98%  LMP 02/10/2014 Physical Exam  Constitutional: She is oriented to person, place, and time. She appears well-developed and well-nourished. No distress.  HENT:  Head: Normocephalic and atraumatic.  Eyes: Conjunctivae and EOM are normal.  Neck: Neck supple.  Cardiovascular: Normal rate.   Pulmonary/Chest: Effort normal. No respiratory distress.  Musculoskeletal: Normal range of motion.  Neurological: She is alert and oriented to person, place, and time.  Skin: Skin is warm and dry.  Small pustule, erythema, superficial laceration on right thumb.   Psychiatric: She has a normal mood and affect. Her behavior is normal.  Nursing note and vitals reviewed.   ED Course  Procedures (including critical care time) DIAGNOSTIC STUDIES: Oxygen Saturation is 98% on RA, normal by my interpretation.    COORDINATION OF CARE: 11:33 AM Discussed treatment plan with patient at beside, the patient agrees with the plan and has no further questions at this time.   Labs Review Labs Reviewed - No data to display  Imaging Review Dg Finger Thumb Right  03/03/2014   CLINICAL DATA:  24 year old female with right thumb pain status post dog bite  EXAM: RIGHT THUMB 2+V  COMPARISON:  None.  FINDINGS: There is no evidence of fracture or dislocation. There is no evidence of arthropathy or other focal bone abnormality. Soft tissues are unremarkable  IMPRESSION: Negative.   Electronically Signed   By: Jacqulynn Cadet M.D.   On: 03/03/2014 14:17     EKG Interpretation None      MDM Pt did not fill antibiotic.  She can not get until am.    Final diagnoses:  Dog bite of finger, initial encounter     I personally performed the services in this documentation, which was scribed in my presence.  The recorded information has been reviewed and considered.   Ronnald Collum.    Hollace Kinnier [place], PA-C 03/04/14 [place], MD 03/06/14 [phone]

## 2014-03-04 NOTE — ED Notes (Signed)
Declined W/C at D/C and was escorted to lobby by RN. 

## 2014-03-04 NOTE — Discharge Instructions (Signed)
Animal Bite °An animal bite can result in a scratch on the skin, deep open cut, puncture of the skin, crush injury, or tearing away of the skin or a body part. Dogs are responsible for most animal bites. Children are bitten more often than adults. An animal bite can range from very mild to more serious. A small bite from your house pet is no cause for alarm. However, some animal bites can become infected or injure a bone or other tissue. You must seek medical care if: °· The skin is broken and bleeding does not slow down or stop after 15 minutes. °· The puncture is deep and difficult to clean (such as a cat bite). °· Pain, warmth, redness, or pus develops around the wound. °· The bite is from a stray animal or rodent. There may be a risk of rabies infection. °· The bite is from a snake, raccoon, skunk, fox, coyote, or bat. There may be a risk of rabies infection. °· The person bitten has a chronic illness such as diabetes, liver disease, or cancer, or the person takes medicine that lowers the immune system. °· There is concern about the location and severity of the bite. °It is important to clean and protect an animal bite wound right away to prevent infection. Follow these steps: °· Clean the wound with plenty of water and soap. °· Apply an antibiotic cream. °· Apply gentle pressure over the wound with a clean towel or gauze to slow or stop bleeding. °· Elevate the affected area above the heart to help stop any bleeding. °· Seek medical care. Getting medical care within 8 hours of the animal bite leads to the best possible outcome. °DIAGNOSIS  °Your caregiver will most likely: °· Take a detailed history of the animal and the bite injury. °· Perform a wound exam. °· Take your medical history. °Blood tests or X-rays may be performed. Sometimes, infected bite wounds are cultured and sent to a lab to identify the infectious bacteria.  °TREATMENT  °Medical treatment will depend on the location and type of animal bite as  well as the patient's medical history. Treatment may include: °· Wound care, such as cleaning and flushing the wound with saline solution, bandaging, and elevating the affected area. °· Antibiotics. °· Tetanus immunization. °· Rabies immunization. °· Leaving the wound open to heal. This is often done with animal bites, due to the high risk of infection. However, in certain cases, wound closure with stitches, wound adhesive, skin adhesive strips, or staples may be used. ° Infected bites that are left untreated may require intravenous (IV) antibiotics and surgical treatment in the hospital. °HOME CARE INSTRUCTIONS °· Follow your caregiver's instructions for wound care. °· Take all medicines as directed. °· If your caregiver prescribes antibiotics, take them as directed. Finish them even if you start to feel better. °· Follow up with your caregiver for further exams or immunizations as directed. °You may need a tetanus shot if: °· You cannot remember when you had your last tetanus shot. °· You have never had a tetanus shot. °· The injury broke your skin. °If you get a tetanus shot, your arm may swell, get red, and feel warm to the touch. This is common and not a problem. If you need a tetanus shot and you choose not to have one, there is a rare chance of getting tetanus. Sickness from tetanus can be serious. °SEEK MEDICAL CARE IF: °· You notice warmth, redness, soreness, swelling, pus discharge, or a bad   smell coming from the wound.  You have a red line on the skin coming from the wound.  You have a fever, chills, or a general ill feeling.  You have nausea or vomiting.  You have continued or worsening pain.  You have trouble moving the injured part.  You have other questions or concerns. MAKE SURE YOU:  Understand these instructions.  Will watch your condition.  Will get help right away if you are not doing well or get worse. Document Released: 09/23/2010 Document Revised: 03/30/2011 Document  Reviewed: 09/23/2010 [facility] Patient Information 2015 [place], [place]. This information is not intended to replace advice given to you by your health care provider. Make sure you discuss any questions you have with your health care provider. Soak finger 20 minutes 4-5 times today

## 2014-03-12 ENCOUNTER — Ambulatory Visit: Payer: Self-pay | Admitting: Internal Medicine

## 2014-03-16 ENCOUNTER — Encounter: Payer: Self-pay | Admitting: Internal Medicine

## 2014-03-16 ENCOUNTER — Ambulatory Visit: Payer: Self-pay | Attending: Internal Medicine | Admitting: Internal Medicine

## 2014-03-16 VITALS — BP 116/84 | HR 85 | Temp 98.4°F | Resp 16 | Ht 64.0 in | Wt 242.0 lb

## 2014-03-16 DIAGNOSIS — E039 Hypothyroidism, unspecified: Secondary | ICD-10-CM | POA: Insufficient documentation

## 2014-03-16 DIAGNOSIS — F329 Major depressive disorder, single episode, unspecified: Secondary | ICD-10-CM

## 2014-03-16 DIAGNOSIS — K219 Gastro-esophageal reflux disease without esophagitis: Secondary | ICD-10-CM

## 2014-03-16 DIAGNOSIS — E038 Other specified hypothyroidism: Secondary | ICD-10-CM

## 2014-03-16 DIAGNOSIS — R1902 Left upper quadrant abdominal swelling, mass and lump: Secondary | ICD-10-CM

## 2014-03-16 DIAGNOSIS — E063 Autoimmune thyroiditis: Secondary | ICD-10-CM

## 2014-03-16 DIAGNOSIS — F32A Depression, unspecified: Secondary | ICD-10-CM

## 2014-03-16 LAB — TSH: TSH: 14.959 u[IU]/mL — AB (ref 0.350–4.500)

## 2014-03-16 MED ORDER — LEVOTHYROXINE SODIUM 150 MCG PO TABS
150.0000 ug | ORAL_TABLET | Freq: Every day | ORAL | Status: DC
Start: 2014-03-16 — End: 2014-08-03

## 2014-03-16 MED ORDER — OMEPRAZOLE 20 MG PO CPDR: 20.0000 mg | DELAYED_RELEASE_CAPSULE | Freq: Every day | ORAL | Status: DC

## 2014-03-16 MED ORDER — FLUOXETINE HCL 20 MG PO CAPS: 20.0000 mg | ORAL_CAPSULE | Freq: Two times a day (BID) | ORAL | Status: DC

## 2014-03-16 NOTE — Progress Notes (Signed)
Patient ID: Kimberly [name], female   DOB: 28-Dec-1990, 24 y.o.   MRN: [id number]  CC: follow up  HPI: Kimberly [name] is a 24 y.o. female here today for a follow up visit.  Patient has past medical history of hypothyroidism. She states that she was seen in the ER 2 weeks ago for a dog bite. She was given a Tdap and doxycycline. She presents for a wound recheck. She notes that the wound has healed and she completed all antibiotics.  Has been out of her synthroid and prozac for 5 days due to appointment cancellation. She has noticed some LUQ abdominal pain over the past few days.    Patient has No headache, No chest pain, No abdominal pain - No Nausea, No new weakness tingling or numbness, No Cough - SOB.  Allergies  Allergen Reactions  . Sulfa Antibiotics Swelling and Other (See Comments)  . Abilify [Aripiprazole]     Lethargy, unable to function  . Amoxicillin-Pot Clavulanate Other (See Comments)    Stomach pains  . Cephalexin Other (See Comments)    Stomach pains   . Latex     Swelling, burning of skin .   Marland Kitchen Tape     Swelling, burning of skin .   Marland Kitchen Zicam Cold Remedy [Erysidoron #1] Nausea And Vomiting  . Vicodin [Hydrocodone-Acetaminophen] Hives and Rash   Past Medical History  Diagnosis Date  . Gastric reflux   . Anxiety   . Depression   . Scoliosis   . 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 Complication of anesthesia     "takes alot to put me to sleep; woke up completely mid endoscopy" (11/16/2012)  . Asthma     "grew out of it" (11/16/2012)  . Iron deficiency anemia   . Migraines     "weekly" (11/16/2012)  . Schizophrenia     "moderate" (11/16/2012)  . Dysrhythmia     ST (11/16/2012)   Current Outpatient Prescriptions on File Prior to Visit  Medication Sig Dispense Refill  . doxycycline (VIBRAMYCIN) 100 MG capsule Take 1 capsule (100 mg total) by mouth 2 (two) times daily. One po bid x 5 days (Patient not taking: Reported on 03/16/2014) 10 capsule 0    . esomeprazole (NEXIUM) 40 MG capsule Take 1 capsule (40 mg total) by mouth daily. (Patient not taking: Reported on 02/02/2014) 30 capsule 3  . ferrous sulfate 325 (65 FE) MG tablet Take 1 tablet (325 mg total) by mouth daily. (Patient not taking: Reported on 03/16/2014) 30 tablet 0  . FLUoxetine (PROZAC) 20 MG capsule Take 1 capsule (20 mg total) by mouth 2 (two) times daily. (Patient not taking: Reported on 03/16/2014) 60 capsule 1  . ibuprofen (ADVIL,MOTRIN) 800 MG tablet Take 1 tablet (800 mg total) by mouth 3 (three) times daily. (Patient not taking: Reported on 03/16/2014) 21 tablet 0  . levothyroxine (SYNTHROID) 150 MCG tablet Take 1 tablet (150 mcg total) by mouth daily before breakfast. (Patient not taking: Reported on 03/16/2014) 30 tablet 4  . metroNIDAZOLE (FLAGYL) 500 MG tablet Take 1 tablet (500 mg total) by mouth 3 (three) times daily. (Patient not taking: Reported on 02/02/2014) 42 tablet 0  . metroNIDAZOLE (FLAGYL) 500 MG tablet Take 1 tablet (500 mg total) by mouth 2 (two) times daily. (Patient not taking: Reported on 02/02/2014) 14 tablet 0  . ondansetron (ZOFRAN ODT) 4 MG disintegrating tablet 4mg  ODT q4 hours prn nausea/vomit (Patient not taking: Reported on 03/16/2014) 10 tablet 0  .  ondansetron (ZOFRAN) 4 MG tablet Take 1 tablet (4 mg total) by mouth every 6 (six) hours. (Patient not taking: Reported on 03/16/2014) 12 tablet 0  . oxyCODONE-acetaminophen (PERCOCET/ROXICET) 5-325 MG per tablet Take 1 tablet by mouth every 6 (six) hours as needed for moderate pain or severe pain. (Patient not taking: Reported on 03/16/2014) 10 tablet 0  . promethazine (PHENERGAN) 25 MG tablet Take 1 tablet (25 mg total) by mouth every 6 (six) hours as needed for nausea or vomiting. (Patient not taking: Reported on 02/02/2014) 12 tablet 0  . [DISCONTINUED] bethanechol (URECHOLINE) 5 MG tablet Take 5 mg by mouth 3 (three) times daily.      No current facility-administered medications on file prior to visit.    Family History  Problem Relation Age of Onset  . Thyroid disease Maternal Grandmother   . Diabetes Mother   . Hypertension Mother   . Vision loss Mother   . Hypertension Father   . Hyperlipidemia Father    History   Social History  . Marital Status: Single    Spouse Name: N/A  . Number of Children: N/A  . Years of Education: N/A   Occupational History  . Not on file.   Social History Main Topics  . Smoking status: Never Smoker   . Smokeless tobacco: Never Used  . Alcohol Use: No  . Drug Use: No  . Sexual Activity: Yes    Birth Control/ Protection: None   Other Topics Concern  . Not on file   Social History Narrative    Review of Systems: See HPI  Objective:   Filed Vitals:   03/16/14 1147  BP: 116/84  Pulse: 85  Temp: 98.4 F (36.9 C)  Resp: 16    Physical Exam  Cardiovascular: Normal rate, regular rhythm and normal heart sounds.   Pulmonary/Chest: Effort normal and breath sounds normal.  Abdominal: She exhibits mass. There is no hepatosplenomegaly. There is tenderness in the left upper quadrant. There is no rigidity, no rebound and no guarding.    Neurological: She is alert.  Skin: Skin is warm and dry. She is not diaphoretic.     Lab Results  Component Value Date   WBC 13.7* 02/02/2014   HGB 11.1* 02/02/2014   HCT 36.2 02/02/2014   MCV 79.2 02/02/2014   PLT 317 02/02/2014   Lab Results  Component Value Date   CREATININE 0.66 02/02/2014   BUN 6 02/02/2014   NA 136 02/02/2014   K 4.0 02/02/2014   CL 107 02/02/2014   CO2 21 02/02/2014    Lab Results  Component Value Date   HGBA1C 5.2 02/25/2012   Lipid Panel     Component Value Date/Time   CHOL 185 08/07/2013 1141   TRIG 146 08/07/2013 1141   HDL 42 08/07/2013 1141   CHOLHDL 4.4 08/07/2013 1141   VLDL 29 08/07/2013 1141   LDLCALC 114* 08/07/2013 1141       Assessment and plan:   Kimberly [name] was seen today for follow-up.  Diagnoses and all orders for this  visit:  Hypothyroidism, acquired, autoimmune Orders: -     TSH -   refill levothyroxine (SYNTHROID) 150 MCG tablet; Take 1 tablet (150 mcg total) by mouth daily before breakfast.  Depression Orders: -    Refill FLUoxetine (PROZAC) 20 MG capsule; Take 1 capsule (20 mg total) by mouth 2 (two) times daily. I have advised patient to seek counseling at [facility] or [facility]  Left upper quadrant abdominal mass Orders: -  US Abdomen Complete; Future  Gastroesophageal reflux disease, esophagitis presence not specified Orders: -    Refill omeprazole (PRILOSEC) 20 MG capsule; Take 1 capsule (20 mg total) by mouth daily.  We will f/u pending ultrasound results.        Chari Manning, NP-C [facility] [phone] 03/16/2014, 12:39 PM

## 2014-03-16 NOTE — Progress Notes (Signed)
Pt is here following up on the dog bite on her right thumb. Wound has healed up nicely. Pt is fasting and requesting lab work today. Pt has been out of all her medications for over a week.

## 2014-03-19 ENCOUNTER — Telehealth: Payer: Self-pay | Admitting: *Deleted

## 2014-03-19 DIAGNOSIS — E063 Autoimmune thyroiditis: Secondary | ICD-10-CM

## 2014-03-19 NOTE — Telephone Encounter (Signed)
-----   Message from Lance Bosch, NP sent at 03/18/2014  7:03 PM EST ----- Ask patient if she's been off Synthroid medication longer than stated. Her levels actually increased higher than before. We will need to do a recheck in 6-8 Weeks, Please place future TSH order

## 2014-03-19 NOTE — Telephone Encounter (Signed)
Lab order placed  LVM to return call

## 2014-03-23 ENCOUNTER — Ambulatory Visit (HOSPITAL_COMMUNITY)
Admission: RE | Admit: 2014-03-23 | Discharge: 2014-03-23 | Disposition: A | Discharge status: Home / Self Care | Payer: Self-pay | Source: Ambulatory Visit | Attending: Internal Medicine | Admitting: Internal Medicine

## 2014-03-23 ENCOUNTER — Other Ambulatory Visit: Payer: Self-pay | Admitting: Internal Medicine

## 2014-03-23 DIAGNOSIS — R1012 Left upper quadrant pain: Secondary | ICD-10-CM | POA: Insufficient documentation

## 2014-03-23 DIAGNOSIS — R1902 Left upper quadrant abdominal swelling, mass and lump: Secondary | ICD-10-CM | POA: Insufficient documentation

## 2014-03-27 ENCOUNTER — Telehealth: Payer: Self-pay | Admitting: *Deleted

## 2014-03-27 NOTE — Telephone Encounter (Signed)
-----   Message from Lance Bosch, NP sent at 03/23/2014 10:09 AM EST ----- Negative ultrasound. No mass found. May just be muscular swelling. Try NSAID's

## 2014-03-27 NOTE — Telephone Encounter (Signed)
Left message to return my call.  

## 2014-04-06 ENCOUNTER — Ambulatory Visit: Payer: Self-pay

## 2014-07-09 ENCOUNTER — Ambulatory Visit: Payer: Self-pay | Attending: Internal Medicine

## 2014-08-03 ENCOUNTER — Other Ambulatory Visit: Payer: Self-pay | Admitting: Internal Medicine

## 2014-08-03 DIAGNOSIS — E063 Autoimmune thyroiditis: Secondary | ICD-10-CM

## 2014-08-03 MED ORDER — LEVOTHYROXINE SODIUM 150 MCG PO TABS: 150.0000 ug | ORAL_TABLET | Freq: Every day | ORAL | Status: DC

## 2014-08-07 ENCOUNTER — Emergency Department (HOSPITAL_COMMUNITY)
Admission: EM | Admit: 2014-08-07 | Discharge: 2014-08-07 | Disposition: A | Discharge status: Home / Self Care | Payer: Self-pay | Attending: Emergency Medicine | Admitting: Emergency Medicine

## 2014-08-07 ENCOUNTER — Emergency Department (HOSPITAL_COMMUNITY): Payer: Self-pay

## 2014-08-07 ENCOUNTER — Encounter (HOSPITAL_COMMUNITY): Payer: Self-pay | Admitting: Neurology

## 2014-08-07 DIAGNOSIS — M419 Scoliosis, unspecified: Secondary | ICD-10-CM | POA: Insufficient documentation

## 2014-08-07 DIAGNOSIS — K529 Noninfective gastroenteritis and colitis, unspecified: Secondary | ICD-10-CM | POA: Insufficient documentation

## 2014-08-07 DIAGNOSIS — K219 Gastro-esophageal reflux disease without esophagitis: Secondary | ICD-10-CM | POA: Insufficient documentation

## 2014-08-07 DIAGNOSIS — F329 Major depressive disorder, single episode, unspecified: Secondary | ICD-10-CM | POA: Insufficient documentation

## 2014-08-07 DIAGNOSIS — Z91040 Latex allergy status: Secondary | ICD-10-CM | POA: Insufficient documentation

## 2014-08-07 DIAGNOSIS — F419 Anxiety disorder, unspecified: Secondary | ICD-10-CM | POA: Insufficient documentation

## 2014-08-07 DIAGNOSIS — E071 Dyshormogenetic goiter: Secondary | ICD-10-CM | POA: Insufficient documentation

## 2014-08-07 DIAGNOSIS — Z880 Allergy status to penicillin: Secondary | ICD-10-CM | POA: Insufficient documentation

## 2014-08-07 DIAGNOSIS — Z8679 Personal history of other diseases of the circulatory system: Secondary | ICD-10-CM | POA: Insufficient documentation

## 2014-08-07 DIAGNOSIS — Z79899 Other long term (current) drug therapy: Secondary | ICD-10-CM | POA: Insufficient documentation

## 2014-08-07 DIAGNOSIS — J029 Acute pharyngitis, unspecified: Secondary | ICD-10-CM | POA: Insufficient documentation

## 2014-08-07 DIAGNOSIS — Z3490 Encounter for supervision of normal pregnancy, unspecified, unspecified trimester: Secondary | ICD-10-CM

## 2014-08-07 DIAGNOSIS — J45909 Unspecified asthma, uncomplicated: Secondary | ICD-10-CM | POA: Insufficient documentation

## 2014-08-07 DIAGNOSIS — F209 Schizophrenia, unspecified: Secondary | ICD-10-CM | POA: Insufficient documentation

## 2014-08-07 DIAGNOSIS — Z331 Pregnant state, incidental: Secondary | ICD-10-CM | POA: Insufficient documentation

## 2014-08-07 DIAGNOSIS — Z862 Personal history of diseases of the blood and blood-forming organs and certain disorders involving the immune mechanism: Secondary | ICD-10-CM | POA: Insufficient documentation

## 2014-08-07 LAB — CBC WITH DIFFERENTIAL/PLATELET
BASOS PCT: 0 % (ref 0–1)
Basophils Absolute: 0 10*3/uL (ref 0.0–0.1)
Eosinophils Absolute: 0 10*3/uL (ref 0.0–0.7)
Eosinophils Relative: 0 % (ref 0–5)
HEMATOCRIT: 35.8 % — AB (ref 36.0–46.0)
HEMOGLOBIN: 11.2 g/dL — AB (ref 12.0–15.0)
LYMPHS ABS: 1.9 10*3/uL (ref 0.7–4.0)
Lymphocytes Relative: 10 % — ABNORMAL LOW (ref 12–46)
MCH: 25 pg — ABNORMAL LOW (ref 26.0–34.0)
MCHC: 31.3 g/dL (ref 30.0–36.0)
MCV: 79.9 fL (ref 78.0–100.0)
MONOS PCT: 6 % (ref 3–12)
Monocytes Absolute: 1.1 10*3/uL — ABNORMAL HIGH (ref 0.1–1.0)
NEUTROS PCT: 84 % — AB (ref 43–77)
Neutro Abs: 15.7 10*3/uL — ABNORMAL HIGH (ref 1.7–7.7)
PLATELETS: 344 10*3/uL (ref 150–400)
RBC: 4.48 MIL/uL (ref 3.87–5.11)
RDW: 15 % (ref 11.5–15.5)
WBC: 18.7 10*3/uL — ABNORMAL HIGH (ref 4.0–10.5)

## 2014-08-07 LAB — URINALYSIS, ROUTINE W REFLEX MICROSCOPIC
BILIRUBIN URINE: NEGATIVE
Glucose, UA: NEGATIVE mg/dL
HGB URINE DIPSTICK: NEGATIVE
KETONES UR: NEGATIVE mg/dL
Leukocytes, UA: NEGATIVE
NITRITE: NEGATIVE
PH: 8 (ref 5.0–8.0)
PROTEIN: NEGATIVE mg/dL
SPECIFIC GRAVITY, URINE: 1.022 (ref 1.005–1.030)
Urobilinogen, UA: 1 mg/dL (ref 0.0–1.0)

## 2014-08-07 LAB — COMPREHENSIVE METABOLIC PANEL
ALBUMIN: 3.9 g/dL (ref 3.5–5.0)
ALT: 18 U/L (ref 14–54)
AST: 17 U/L (ref 15–41)
Alkaline Phosphatase: 74 U/L (ref 38–126)
Anion gap: 9 (ref 5–15)
BUN: 5 mg/dL — AB (ref 6–20)
CO2: 21 mmol/L — ABNORMAL LOW (ref 22–32)
Calcium: 9 mg/dL (ref 8.9–10.3)
Chloride: 105 mmol/L (ref 101–111)
Creatinine, Ser: 0.59 mg/dL (ref 0.44–1.00)
GFR calc Af Amer: >60 mL/min (ref >60)
GFR calc non Af Amer: >60 mL/min (ref >60)
GLUCOSE: 101 mg/dL — AB (ref 65–99)
Potassium: 3.7 mmol/L (ref 3.5–5.1)
Sodium: 135 mmol/L (ref 135–145)
Total Bilirubin: 1.6 mg/dL — ABNORMAL HIGH (ref 0.3–1.2)
Total Protein: 7.3 g/dL (ref 6.5–8.1)

## 2014-08-07 LAB — RAPID STREP SCREEN (MED CTR MEBANE ONLY): Streptococcus, Group A Screen (Direct): NEGATIVE

## 2014-08-07 LAB — I-STAT CG4 LACTIC ACID, ED
Lactic Acid, Venous: 1.38 mmol/L (ref 0.5–2.0)
Lactic Acid, Venous: 0.39 mmol/L — ABNORMAL LOW (ref 0.5–2.0)

## 2014-08-07 LAB — I-STAT BETA HCG BLOOD, ED (MC, WL, AP ONLY): HCG, QUANTITATIVE: 17.9 m[IU]/mL — AB (ref <5)

## 2014-08-07 LAB — MONONUCLEOSIS SCREEN: MONO SCREEN: NEGATIVE

## 2014-08-07 MED ORDER — KETOROLAC TROMETHAMINE 30 MG/ML IJ SOLN
30.0000 mg | Freq: Once | INTRAMUSCULAR | Status: AC
  Administered 2014-08-07: 30 mg via INTRAVENOUS
  Filled 2014-08-07: qty 1

## 2014-08-07 MED ORDER — SODIUM CHLORIDE 0.9 % IV SOLN
INTRAVENOUS | Status: DC
  Administered 2014-08-07: 18:00:00 via INTRAVENOUS

## 2014-08-07 MED ORDER — PROMETHAZINE HCL 25 MG PO TABS: 25.0000 mg | ORAL_TABLET | Freq: Four times a day (QID) | ORAL | Status: DC | PRN

## 2014-08-07 MED ORDER — SODIUM CHLORIDE 0.9 % IV BOLUS (SEPSIS)
2000.0000 mL | Freq: Once | INTRAVENOUS | Status: AC
  Administered 2014-08-07: 2000 mL via INTRAVENOUS

## 2014-08-07 MED ORDER — ACETAMINOPHEN 325 MG PO TABS
325.0000 mg | ORAL_TABLET | Freq: Once | ORAL | Status: AC
  Administered 2014-08-07: 325 mg via ORAL
  Filled 2014-08-07: qty 1

## 2014-08-07 MED ORDER — ONDANSETRON HCL 4 MG/2ML IJ SOLN
4.0000 mg | Freq: Once | INTRAMUSCULAR | Status: AC
  Administered 2014-08-07: 4 mg via INTRAVENOUS
  Filled 2014-08-07: qty 2

## 2014-08-07 MED ORDER — ACETAMINOPHEN 325 MG PO TABS
650.0000 mg | ORAL_TABLET | Freq: Once | ORAL | Status: AC | PRN
  Administered 2014-08-07: 650 mg via ORAL
  Filled 2014-08-07: qty 2

## 2014-08-07 MED ORDER — SODIUM CHLORIDE 0.9 % IV BOLUS (SEPSIS)
1000.0000 mL | Freq: Once | INTRAVENOUS | Status: AC
  Administered 2014-08-07: 1000 mL via INTRAVENOUS

## 2014-08-07 NOTE — Discharge Instructions (Signed)
Take Tylenol every 6 hours for the fevers. Follow-up of with the wellness clinic in the next several days. Also follow-up with OB/GYN for the pregnancy. Resource guide provided below. Take Phenergan as needed for the nausea and vomiting. Return for any new or worse symptoms. If not better in a week it would be reasonable to have the Monospot test rechecked.    Work note provided.   Emergency Department Resource Guide 1) Find a Doctor and Pay Out of Pocket Although you won't have to find out who is covered by your insurance plan, it is a good idea to ask around and get recommendations. You will then need to call the office and see if the doctor you have chosen will accept you as a new patient and what types of options they offer for patients who are self-pay. Some doctors offer discounts or will set up payment plans for their patients who do not have insurance, but you will need to ask so you aren't surprised when you get to your appointment.  2) Contact Your Local Health Department Not all health departments have doctors that can see patients for sick visits, but many do, so it is worth a call to see if yours does. If you don't know where your local health department is, you can check in your phone book. The CDC also has a tool to help you locate your state's health department, and many state websites also have listings of all of their local health departments.  3) Find a [facility] If your illness is not likely to be very severe or complicated, you may want to try a walk in clinic. These are popping up all over the country in pharmacies, drugstores, and shopping centers. They're usually staffed by nurse practitioners or physician assistants that have been trained to treat common illnesses and complaints. They're usually fairly quick and inexpensive. However, if you have serious medical issues or chronic medical problems, these are probably not your best option.  No Primary Care Doctor: - Call  Health Connect at  [phone] - they can help you locate a primary care doctor that  accepts your insurance, provides certain services, etc. - Physician Referral Service- [phone]  Chronic Pain Problems: Organization         Address  Phone   Notes  [facility]  [phone] Patients need to be referred by their primary care doctor.   Medication Assistance: Organization         Address  Phone   Notes  [facility] Medication [facility] [place], [place] [address] [phone] --Must be a resident of [facility] -- Must have NO insurance coverage whatsoever (no Medicaid/ Medicare, etc.) -- The pt. MUST have a primary care doctor that directs their care regularly and follows them in the community   [facility]  [phone]   [employer]  [phone]    Agencies that provide inexpensive medical care: Organization         Address  Phone   Notes  [place]  [phone]   [facility]    [phone]   [facility] [place], [place] [address] [phone]   [place] [phone]   Planned Parenthood    [phone]   [facility]    [phone]   Community Health and [facility]  [place], [place] Phone:  [phone], Fax:  [phone] Hours of Operation:  9 am - 6 pm, M-F.  Also accepts Medicaid/Medicare and self-pay.  [facility] for [place], [address], [place] Phone: [phone], Fax: [phone]. Hours of Operation:  8:30 am - 5:30 pm, M-F.  Also accepts Medicaid and self-pay.  [facility] [address], [place] Phone: [phone]   [place], [place], [place] [phone], Ext. [phone] Mondays & Thursdays: 7-9 AM.  First 15 patients are seen on a first come, first serve  basis.    [place] Providers:  Organization         Address  Phone   Notes  [facility] [address], [place] [phone] Also accepts self-pay patients.  [facility] [address], [place]  [phone]   [place], [address], [place] [phone]   [facility] [address], [place] [phone]   Lucianne Lei [address], [place]   [phone] Only accepts [place] Access [place] patients after they have their name applied to their card.   Self-Pay (no insurance) in [facility]:  Organization         Address  Phone   Notes  Sickle Cell Patients, [facility] [place] [phone]   [facility] [place] [phone]   [facility] [place]  [place], [place], [place] [phone]   [facility]/Dr. Osei-Bonsu  [address] [place] or [address], [place] [phone] Phone number for both [place] and [place] locations is the same.  Urgent Medical and [facility] [address], [place] [phone]   [facility] [address] [place] or [address] [phone]   [facility] [address], [place] [phone], phone; [phone], fax Sees patients 1st and 3rd Saturday of every month.  Must not qualify for public or private insurance (i.e. Medicaid, Medicare, [place] Health Choice, Veterans' Benefits)  Household income should be no more than 200% of the poverty level The clinic cannot treat you if you are pregnant or think you are pregnant  Sexually transmitted diseases are not treated at the clinic.    Dental Care: Organization         Address  Phone  Notes  [facility] Department of [facility] [phone] Accepts children up to age 92 who are enrolled in [place] or [place]; pregnant women with a Medicaid card; and children who have applied for Medicaid or [place] Health Choice, but were declined, whose parents can pay a reduced fee at time of service.  [facility] Department of [facility]  [address], [place] [phone] Accepts children up to age 92 who are enrolled in [place] or [place]; pregnant women with a Medicaid card; and children who have applied for Medicaid or [place] Health Choice, but were declined, whose parents can pay a reduced fee at time of service.  [facility] Adult Dental Access PROGRAM  [place], [place] [phone]  Patients are seen by appointment only. Walk-ins are not accepted. [place] will see patients 64 years of age and older. Monday - Tuesday (8am-5pm) Most Wednesdays (8:30-5pm) $30 per visit, cash only  [facility] Adult Dental Access PROGRAM  [address], [facility] [phone] Patients are seen by appointment only. Walk-ins are not accepted. [place] will see patients 66 years of age and older. One Wednesday Evening (Monthly: Volunteer Based).  $30 per visit, cash only  [place]  [phone] for adults; Children under age 47, call Graduate Pediatric Dentistry at [phone]. Children aged 45-14, please call [phone] to request a pediatric application.  Dental services are provided in all areas of dental care including fillings, crowns and bridges, complete and partial dentures, implants, gum treatment, root canals, and extractions. Preventive care is also provided. Treatment is provided to both adults and children. Patients are selected via a lottery and there is often a waiting list.   [facility] [address] [place]  [phone]  [email or web address]   [facility] [address] [place], [place] [phone], Ext. [phone] Second and Fourth Thursday of each month, opens at 6:30 AM; Clinic ends at 9 AM.  Patients are seen on a first-come first-served basis, and a limited number are seen during each clinic.   [facility]  [address] Hillard Danker [place], [place] [phone]   Eligibility Requirements You must have lived in [place], [place], or [place] counties for at least the last three months.   You cannot be eligible for state or federal sponsored [employer], including [employer], [place], or [employer].   You generally cannot be eligible for healthcare insurance through your employer.    How to apply: Eligibility screenings are held every Tuesday and Wednesday afternoon from 1:00 pm until 4:00 pm. You do not need an appointment for the interview!  [facility] [address] [place], [place]   [place]  [place] Department  [place]  [phone]    Behavioral Health Resources in the Community: Intensive Outpatient Programs Organization         Address  Phone  Notes  [place]. [address] [place], [place] [phone]   [facility] Outpatient [address] [place], [place]   ADS: Alcohol & Drug Svcs [address], [place], [place]   [place] [address]  [place], [place] or [phone]   Substance Abuse Resources Organization         Address  Phone  Notes  Alcohol and Drug Services  [phone]   [place]  [phone]   [facility] [place]   Chinita Pester  [phone]   Residential & Outpatient Substance Abuse Program  [phone]   Psychological Services Organization          Address  Phone  Notes  [facility] [place]  [place]  [phone]   [place] [address], [place] or [phone]    Mobile Crisis Teams Organization         Address  Phone  Notes  [facility], Mobile Crisis Care Unit  [phone]   Assertive Psychotherapeutic Services  [address] [place], [place]   [facility] [address] [place]  [place] [phone]    Self-Help/Support Groups Organization         Address  Phone             Notes  Mental Health Assoc. of [place] - variety of support groups  [place] Call for more information  Narcotics Anonymous (NA), [facility] [address], [employer] [place]  2 meetings at this location   Special educational needs teacher         Address  Phone  Notes  [facility]idential [facility] [place],    [place]  [phone]   [facility]  [address] [place] [id number], [place], [place]   [place], [place] [phone] Admissions: 8am-3pm M-F  Incentives Substance [place] [address],    [place], [place] [phone]   [facility] [address] [place], [place], [place]   [facility] [address],  [place], [place]   [facility] - Intensive Outpatient [address] Kristeen [address], [place], [place]   [facility] ([place].) [place].,  [place], [place] [phone] or [phone]   [facility] Services (RTS) [address] [place], [place] Accepts Medicaid  [facility] [place] [address],  [place] [phone] Substance Abuse/Addiction Treatment   [facility] Organization         Address  Phone  Notes  [facility]  [phone]   Domenic Schwab, PhD [address] Arlis Porta [place], [place]   [phone] or [phone]   [place], [place] [phone]   [facility] [address] [place], [place] [phone] Insurance/Medicaid/sponsorship through [facility] and Families [address] [place]                                    [place], [place] [phone] [place] [address], [place] [phone]    Dr. Adele Schilder  [phone]   [facility] Dept. 1) [address] [place] 2) [place] 3)  [place] [address], [place] [phone]  [phone]   [place] [phone] or [phone] (After Hours)

## 2014-08-07 NOTE — ED Notes (Signed)
Pt reports fever, n/v/d for 2 days. Pt is a x 4. HR 170, Temp 103.

## 2014-08-07 NOTE — ED Provider Notes (Signed)
CSN: [id number]     Arrival date & time 08/07/14  1516 History   First MD Initiated Contact with Patient 08/07/14 1611     Chief Complaint  Patient presents with  . Emesis  . Diarrhea  . Fever     (Consider location/radiation/quality/duration/timing/severity/associated sxs/prior Treatment) Patient is a 24 y.o. female presenting with vomiting, diarrhea, and fever. The history is provided by the patient.  Emesis Associated symptoms: diarrhea   Associated symptoms: no abdominal pain, no headaches and no myalgias   Diarrhea Associated symptoms: fever and vomiting   Associated symptoms: no abdominal pain, no headaches and no myalgias   Fever Associated symptoms: diarrhea, nausea and vomiting   Associated symptoms: no chest pain, no confusion, no congestion, no dysuria, no headaches, no myalgias and no rash    patient with 2 day history of nausea vomiting and diarrhea and fever. Also sore throat. No chest pain no shortness of breath no cough no abdominal pain. No vaginal bleeding no vaginal discharge. Slight tinge of blood and some of the vomit but no vomiting any pool of blood no blood in the bowel movements. Patient has had strep throat in the past. This somewhat reminds her of that.  Past Medical History  Diagnosis Date  . Gastric reflux   . Anxiety   . Depression   . Scoliosis   . 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 Complication of anesthesia     "takes alot to put me to sleep; woke up completely mid endoscopy" (11/16/2012)  . Asthma     "grew out of it" (11/16/2012)  . Iron deficiency anemia   . Migraines     "weekly" (11/16/2012)  . Schizophrenia     "moderate" (11/16/2012)  . Dysrhythmia     ST (11/16/2012)   Past Surgical History  Procedure Laterality Date  . Upper gi endoscopy  ~ 2006; ~2008   Family History  Problem Relation Age of Onset  . Thyroid disease Maternal Grandmother   . Diabetes Mother   . Hypertension Mother   . Vision loss  Mother   . Hypertension Father   . Hyperlipidemia Father    History  Substance Use Topics  . Smoking status: Never Smoker   . Smokeless tobacco: Never Used  . Alcohol Use: No   OB History    Gravida Para Term Preterm AB TAB SAB Ectopic Multiple Living   0              Review of Systems  Constitutional: Positive for fever and fatigue.  HENT: Negative for congestion.   Eyes: Negative for visual disturbance.  Respiratory: Negative for shortness of breath.   Cardiovascular: Negative for chest pain.  Gastrointestinal: Positive for nausea, vomiting and diarrhea. Negative for abdominal pain.  Genitourinary: Negative for dysuria.  Musculoskeletal: Negative for myalgias and back pain.  Skin: Negative for rash.  Neurological: Negative for headaches.  Hematological: Does not bruise/bleed easily.  Psychiatric/Behavioral: Negative for confusion.      Allergies  Sulfa antibiotics; Abilify; Amoxicillin-pot clavulanate; Cephalexin; Ferrous sulfate; Latex; Tape; Zicam cold remedy; and Vicodin  Home Medications   Prior to Admission medications   Medication Sig Start Date End Date Taking? Authorizing Provider  FLUoxetine (PROZAC) 20 MG capsule Take 1 capsule (20 mg total) by mouth 2 (two) times daily. 03/16/14  Yes Lance Bosch, NP  ibuprofen (ADVIL,MOTRIN) 800 MG tablet Take 800 mg by mouth every 8 (eight) hours as needed for fever.   Yes Historical  Provider, MD  levothyroxine (SYNTHROID) 150 MCG tablet Take 1 tablet (150 mcg total) by mouth daily before breakfast. 08/03/14  Yes Tresa Garter, MD  omeprazole (PRILOSEC) 20 MG capsule Take 1 capsule (20 mg total) by mouth daily. 03/16/14  Yes Lance Bosch, NP  doxycycline (VIBRAMYCIN) 100 MG capsule Take 1 capsule (100 mg total) by mouth 2 (two) times daily. One po bid x 5 days Patient not taking: Reported on 03/16/2014 03/03/14   Clayton Bibles, PA-C  promethazine (PHENERGAN) 25 MG tablet Take 1 tablet (25 mg total) by mouth every 6 (six)  hours as needed for nausea or vomiting. 08/07/14   Fredia Sorrow, MD   BP 105/67 mmHg  Pulse 85  Temp(Src) 98.8 F (37.1 C) (Oral)  Resp 16  SpO2 98%  LMP 07/31/2014 Physical Exam  Constitutional: She is oriented to person, place, and time. She appears well-developed and well-nourished. No distress.  HENT:  Head: Normocephalic and atraumatic.  Mouth/Throat: Oropharyngeal exudate present.  Because membranes are moist exudate on both tonsils. Tonsils enlarged. Uvula midline.  Eyes: Conjunctivae and EOM are normal. Pupils are equal, round, and reactive to light.  Neck: Normal range of motion. Neck supple.  Cardiovascular: Normal rate, regular rhythm and normal heart sounds.   Pulmonary/Chest: Effort normal. No respiratory distress.  Abdominal: Soft. Bowel sounds are normal. She exhibits no distension.  Musculoskeletal: Normal range of motion. She exhibits no edema.  Neurological: She is alert and oriented to person, place, and time. No cranial nerve deficit. She exhibits normal muscle tone. Coordination normal.  Skin: Skin is warm. No rash noted.  Nursing note and vitals reviewed.   ED Course  Procedures (including critical care time) Labs Review Labs Reviewed  COMPREHENSIVE METABOLIC PANEL - Abnormal; Notable for the following:    CO2 21 (*)    Glucose, Bld 101 (*)    BUN 5 (*)    Total Bilirubin 1.6 (*)    All other components within normal limits  URINALYSIS, ROUTINE W REFLEX MICROSCOPIC (NOT AT [facility]) - Abnormal; Notable for the following:    Color, Urine AMBER (*)    All other components within normal limits  CBC WITH DIFFERENTIAL/PLATELET - Abnormal; Notable for the following:    WBC 18.7 (*)    Hemoglobin 11.2 (*)    HCT 35.8 (*)    MCH 25.0 (*)    Neutrophils Relative % 84 (*)    Neutro Abs 15.7 (*)    Lymphocytes Relative 10 (*)    Monocytes Absolute 1.1 (*)    All other components within normal limits  I-STAT BETA HCG BLOOD, ED (MC, WL, AP ONLY) - Abnormal;  Notable for the following:    I-stat hCG, quantitative 17.9 (*)    All other components within normal limits  I-STAT CG4 LACTIC ACID, ED - Abnormal; Notable for the following:    Lactic Acid, Venous 0.39 (*)    All other components within normal limits  RAPID STREP SCREEN (NOT AT [facility]  CULTURE, BLOOD (ROUTINE X 2)  CULTURE, BLOOD (ROUTINE X 2)  URINE CULTURE  CULTURE, GROUP A STREP  MONONUCLEOSIS SCREEN  I-STAT CG4 LACTIC ACID, ED    Imaging Review Dg Chest 2 View  08/07/2014   CLINICAL DATA:  24 year old female with nausea vomiting and diarrhea. Sore throat and fever.  EXAM: CHEST  2 VIEW  COMPARISON:  Radiograph dated 11/16/2012  FINDINGS: The heart size and mediastinal contours are within normal limits. Both lungs are clear. The visualized  skeletal structures are unremarkable.  IMPRESSION: No active cardiopulmonary disease.   Electronically Signed   By: Anner Crete M.D.   On: 08/07/2014 17:19     EKG Interpretation   Date/Time:  Tuesday August 07 2014 16:00:22 EDT Ventricular Rate:  173 PR Interval:  96 QRS Duration: 84 QT Interval:  244 QTC Calculation: 413 R Axis:   78 Text Interpretation:  Sinus tachycardia with short PR T wave abnormality,  consider inferior ischemia Abnormal ECG Confirmed by RAY MD, Andee Poles  [phone]) on 08/07/2014 4:05:29 PM Also confirmed by Rogene Houston  MD, [place]  [phone])  on 08/07/2014 4:59:47 PM      MDM   Final diagnoses:  Pregnancy  Pharyngitis  Gastroenteritis    Patient clinically with pharyngitis and gastroenteritis with some vomiting and diarrhea. In addition patient early pregnancy. No evidence urinary tract infection. Patient improved significantly with 3 L of fluid here. Patient was to be started on prenatal vitamins but she has an allergy to the iron sulfate so it was held for now. Patient will be discharged on Phenergan taking Tylenol or rigor basis will follow up with wellness clinic. Also resource guide divided to help her with  follow-up for the pregnancy. Patient was initially extremely tachycardic that improved drastically with fluids time of discharge patient's temperature was 98.8 and heart rate was 85. It was a sinus tachycardia. Patient's rapid strep test was negative it'll be followed up with a culture. Also Monospot was negative clinically is suspected patient may have mono. Patient will have that repeated in a week if she's not better. Patient improved significantly stable for discharge home.   Fredia Sorrow, MD 08/07/14 2147

## 2014-08-07 NOTE — ED Notes (Signed)
Pt ambulated to restroom. 

## 2014-08-09 LAB — CULTURE, GROUP A STREP

## 2014-08-10 ENCOUNTER — Ambulatory Visit: Payer: Self-pay | Attending: Internal Medicine

## 2014-08-10 LAB — URINE CULTURE

## 2014-08-12 LAB — CULTURE, BLOOD (ROUTINE X 2)
Culture: NO GROWTH
Culture: NO GROWTH

## 2014-08-17 ENCOUNTER — Ambulatory Visit: Payer: Self-pay | Attending: Internal Medicine | Admitting: Internal Medicine

## 2014-08-17 ENCOUNTER — Encounter: Payer: Self-pay | Admitting: Internal Medicine

## 2014-08-17 VITALS — BP 107/76 | HR 107 | Temp 99.1°F | Resp 18 | Ht 64.0 in | Wt 239.0 lb

## 2014-08-17 DIAGNOSIS — E039 Hypothyroidism, unspecified: Secondary | ICD-10-CM | POA: Insufficient documentation

## 2014-08-17 DIAGNOSIS — F209 Schizophrenia, unspecified: Secondary | ICD-10-CM | POA: Insufficient documentation

## 2014-08-17 DIAGNOSIS — Z3200 Encounter for pregnancy test, result unknown: Secondary | ICD-10-CM

## 2014-08-17 DIAGNOSIS — F329 Major depressive disorder, single episode, unspecified: Secondary | ICD-10-CM | POA: Insufficient documentation

## 2014-08-17 DIAGNOSIS — K219 Gastro-esophageal reflux disease without esophagitis: Secondary | ICD-10-CM | POA: Insufficient documentation

## 2014-08-17 DIAGNOSIS — F419 Anxiety disorder, unspecified: Secondary | ICD-10-CM | POA: Insufficient documentation

## 2014-08-17 DIAGNOSIS — Z3202 Encounter for pregnancy test, result negative: Secondary | ICD-10-CM | POA: Insufficient documentation

## 2014-08-17 DIAGNOSIS — E079 Disorder of thyroid, unspecified: Secondary | ICD-10-CM

## 2014-08-17 DIAGNOSIS — R110 Nausea: Secondary | ICD-10-CM | POA: Insufficient documentation

## 2014-08-17 DIAGNOSIS — Z79899 Other long term (current) drug therapy: Secondary | ICD-10-CM | POA: Insufficient documentation

## 2014-08-17 LAB — TSH: TSH: 0.382 u[IU]/mL (ref 0.350–4.500)

## 2014-08-17 LAB — POCT URINE PREGNANCY: PREG TEST UR: NEGATIVE

## 2014-08-17 LAB — T4, FREE: FREE T4: 1.13 ng/dL (ref 0.80–1.80)

## 2014-08-17 MED ORDER — PRENATAL VITAMINS 0.8 MG PO TABS: 1.0000 | ORAL_TABLET | Freq: Every day | ORAL | Status: DC

## 2014-08-17 MED ORDER — PROMETHAZINE HCL 25 MG PO TABS: 12.5000 mg | ORAL_TABLET | Freq: Three times a day (TID) | ORAL | Status: DC | PRN

## 2014-08-17 NOTE — Progress Notes (Signed)
Patient ID: Kimberly [name], female   DOB: Feb 08, 1990, 24 y.o.   MRN: [id number]  CC: HFU  HPI: Kimberly [name] is a 24 y.o. female here today for a follow up visit.  Patient has past medical history of depression, anxiety, thyroid disease, scoliosis, and schizophrenia. Patient was seen in the ED on 7/19 for symptoms of fever, vomiting, nausea, vomiting. She reports at that time she was given a blood pregnancy test that came back positive. She would like to confirm results today. LMP was 07/29/14. At the time of the ER visit she was tested for strep and mono which was negative and was told to follow up for retest in 5 days. She denies sore throat, enlarged lymph, cough, fever, or chills today.   Patient has No headache, No chest pain, No abdominal pain, No new weakness tingling or numbness, No Cough - SOB.  Allergies  Allergen Reactions  . Sulfa Antibiotics Swelling and Other (See Comments)  . Abilify [Aripiprazole]     Lethargy, unable to function  . Amoxicillin-Pot Clavulanate Other (See Comments)    Stomach pains  . Cephalexin Other (See Comments)    Stomach pains   . Ferrous Sulfate Nausea And Vomiting  . Latex     Swelling, burning of skin .   Marland Kitchen Tape     Swelling, burning of skin .   Marland Kitchen Zicam Cold Remedy [Erysidoron #1] Nausea And Vomiting  . Vicodin [Hydrocodone-Acetaminophen] Hives and Rash   Past Medical History  Diagnosis Date  . Gastric reflux   . Anxiety   . Depression   . Scoliosis   . 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 Complication of anesthesia     "takes alot to put me to sleep; woke up completely mid endoscopy" (11/16/2012)  . Asthma     "grew out of it" (11/16/2012)  . Iron deficiency anemia   . Migraines     "weekly" (11/16/2012)  . Schizophrenia     "moderate" (11/16/2012)  . Dysrhythmia     ST (11/16/2012)   Current Outpatient Prescriptions on File Prior to Visit  Medication Sig Dispense Refill  . FLUoxetine (PROZAC) 20 MG capsule  Take 1 capsule (20 mg total) by mouth 2 (two) times daily. 60 capsule 5  . levothyroxine (SYNTHROID) 150 MCG tablet Take 1 tablet (150 mcg total) by mouth daily before breakfast. 90 tablet 3  . omeprazole (PRILOSEC) 20 MG capsule Take 1 capsule (20 mg total) by mouth daily. 30 capsule 5  . promethazine (PHENERGAN) 25 MG tablet Take 1 tablet (25 mg total) by mouth every 6 (six) hours as needed for nausea or vomiting. (Patient not taking: Reported on 08/17/2014) 20 tablet 0  . [DISCONTINUED] bethanechol (URECHOLINE) 5 MG tablet Take 5 mg by mouth 3 (three) times daily.     . [DISCONTINUED] ferrous sulfate 325 (65 FE) MG tablet Take 1 tablet (325 mg total) by mouth daily. (Patient not taking: Reported on 03/16/2014) 30 tablet 0   No current facility-administered medications on file prior to visit.   Family History  Problem Relation Age of Onset  . Thyroid disease Maternal Grandmother   . Diabetes Mother   . Hypertension Mother   . Vision loss Mother   . Hypertension Father   . Hyperlipidemia Father    History   Social History  . Marital Status: Single    Spouse Name: N/A  . Number of Children: N/A  . Years of Education: N/A   Occupational History  .  Not on file.   Social History Main Topics  . Smoking status: Never Smoker   . Smokeless tobacco: Never Used  . Alcohol Use: No  . Drug Use: No  . Sexual Activity: Yes    Birth Control/ Protection: None   Other Topics Concern  . Not on file   Social History Narrative    Review of Systems: See HPI   Objective:   Filed Vitals:   08/17/14 1521  BP: 107/76  Pulse: 107  Temp: 99.1 F (37.3 C)  Resp: 18    Physical Exam  Constitutional: She is oriented to person, place, and time.  HENT:  Right Ear: External ear normal.  Left Ear: External ear normal.  Nose: Nose normal.  Mouth/Throat: Oropharynx is clear and moist. No oropharyngeal exudate.  Cardiovascular: Normal rate, regular rhythm and normal heart sounds.    Pulmonary/Chest: Effort normal and breath sounds normal.  Abdominal: Soft.  Lymphadenopathy:    She has no cervical adenopathy.  Neurological: She is alert and oriented to person, place, and time.     Lab Results  Component Value Date   WBC 18.7* 08/07/2014   HGB 11.2* 08/07/2014   HCT 35.8* 08/07/2014   MCV 79.9 08/07/2014   PLT 344 08/07/2014   Lab Results  Component Value Date   CREATININE 0.59 08/07/2014   BUN 5* 08/07/2014   NA 135 08/07/2014   K 3.7 08/07/2014   CL 105 08/07/2014   CO2 21* 08/07/2014    Lab Results  Component Value Date   HGBA1C 5.2 02/25/2012   Lipid Panel     Component Value Date/Time   CHOL 185 08/07/2013 1141   TRIG 146 08/07/2013 1141   HDL 42 08/07/2013 1141   CHOLHDL 4.4 08/07/2013 1141   VLDL 29 08/07/2013 1141   LDLCALC 114* 08/07/2013 1141       Assessment and plan:   Kimberly [name] was seen today for hospitalization follow-up.  Diagnoses and all orders for this visit:  Possible pregnancy Orders: -     POCT urine pregnancy -     Throat culture ([facility]) -     hCG, quantitative, pregnancy -     Mononucleosis screen -     Prenatal Multivit-Min-Fe-FA (PRENATAL VITAMINS) 0.8 MG tablet; Take 1 tablet by mouth daily. I highly doubt patient has mono based on presentation. I will do throat culture. Urine pregnancy is negative at this time, likely because it is before her next scheduled menstrual cycle. I will obtain a serum hcg to see if levels are trending up.  If patient is pregnant I will refer to GYN to decide if she should come off prozac or switch to different SSRI  Thyroid disease Orders: -     TSH -     T4, Free Will send refill based on lab level  Nausea without vomiting Orders: -     Refill promethazine (PHENERGAN) 25 MG tablet; Take 0.5 tablets (12.5 mg total) by mouth every 8 (eight) hours as needed for nausea or vomiting.  Return if symptoms worsen or fail to improve.       Lance Bosch, [facility] [phone] 08/17/2014, 3:36 PM

## 2014-08-17 NOTE — Patient Instructions (Signed)
I will call you soon with lab results. If you are pregnant I will switch you to Zantac daily and take you off Omeprazole.   I have sent Phenergan to our pharmacy for Nausea.

## 2014-08-17 NOTE — Progress Notes (Signed)
Patient went to hospital for fever, vomiting, high heart rate.   Hospital told patient to have another mono swab because it takes 5 days to sow up.  Patient needs something for nausea. Nauseous in the mornings and evenings at times.

## 2014-08-18 LAB — HCG, QUANTITATIVE, PREGNANCY

## 2014-08-18 LAB — MONONUCLEOSIS SCREEN: MONO SCREEN: NEGATIVE

## 2014-08-19 LAB — CULTURE, GROUP A STREP: Organism ID, Bacteria: NORMAL

## 2014-08-20 ENCOUNTER — Telehealth: Payer: Self-pay

## 2014-08-20 NOTE — Telephone Encounter (Signed)
-----   Message from Lance Bosch, NP sent at 08/19/2014  8:47 PM EDT ----- Normal throat culture

## 2014-08-20 NOTE — Telephone Encounter (Signed)
Nurse called patient, patient verified date of birth. Patient aware of normal throat culture. Patient questioned hcg blood work. Nurse spoke with Mateo Flow. Patient advised hcg blood work is <2, which is negative. Per Mateo Flow, patient could have had false positive in ED.  Nurse advised patient to use protection or make appointment for birth control if she does not want to get pregnant. Patient voices understanding and has no further questions at this time.

## 2014-10-15 ENCOUNTER — Encounter (HOSPITAL_COMMUNITY): Payer: Self-pay | Admitting: *Deleted

## 2014-10-15 ENCOUNTER — Emergency Department (INDEPENDENT_AMBULATORY_CARE_PROVIDER_SITE_OTHER)
Admission: EM | Admit: 2014-10-15 | Discharge: 2014-10-15 | Disposition: A | Discharge status: Home / Self Care | Payer: Self-pay | Source: Home / Self Care | Attending: Family Medicine | Admitting: Family Medicine

## 2014-10-15 DIAGNOSIS — K529 Noninfective gastroenteritis and colitis, unspecified: Secondary | ICD-10-CM

## 2014-10-15 MED ORDER — ONDANSETRON 4 MG PO TBDP
ORAL_TABLET | ORAL | Status: AC
  Filled 2014-10-15: qty 2

## 2014-10-15 MED ORDER — ONDANSETRON 4 MG PO TBDP
8.0000 mg | ORAL_TABLET | Freq: Once | ORAL | Status: AC
  Administered 2014-10-15: 8 mg via ORAL

## 2014-10-15 MED ORDER — PROMETHAZINE HCL 25 MG PO TABS: 25.0000 mg | ORAL_TABLET | Freq: Four times a day (QID) | ORAL | Status: DC | PRN

## 2014-10-15 NOTE — Discharge Instructions (Signed)
Clear liquid diet tonight as tolerated, advance on tues as improved, use medicine as needed, probiotics will help, imodium for diarrhea, return or see your doctor if any problems.

## 2014-10-15 NOTE — ED Provider Notes (Signed)
CSN: [id number]     Arrival date & time 10/15/14  1713 History   First MD Initiated Contact with Patient 10/15/14 1852     Chief Complaint  Patient presents with  . Emesis   (Consider location/radiation/quality/duration/timing/severity/associated sxs/prior Treatment) Patient is a 24 y.o. female presenting with vomiting. The history is provided by the patient and a parent.  Emesis Severity:  Mild Duration:  3 days Number of daily episodes:  3-4 Quality:  Stomach contents Able to tolerate:  Liquids Progression:  Unchanged Chronicity:  New Recent urination:  Normal Relieved by:  Nothing Worsened by:  Nothing tried Ineffective treatments:  None tried Associated symptoms: abdominal pain, diarrhea and headaches   Associated symptoms: no fever   Risk factors: sick contacts   Risk factors comment:  Mother with similar sx   Past Medical History  Diagnosis Date  . Gastric reflux   . Anxiety   . Depression   . Scoliosis   . 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 Complication of anesthesia     "takes alot to put me to sleep; woke up completely mid endoscopy" (11/16/2012)  . Asthma     "grew out of it" (11/16/2012)  . Iron deficiency anemia   . Migraines     "weekly" (11/16/2012)  . Schizophrenia     "moderate" (11/16/2012)  . Dysrhythmia     ST (11/16/2012)   Past Surgical History  Procedure Laterality Date  . Upper gi endoscopy  ~ 2006; ~2008   Family History  Problem Relation Age of Onset  . Thyroid disease Maternal Grandmother   . Diabetes Mother   . Hypertension Mother   . Vision loss Mother   . Hypertension Father   . Hyperlipidemia Father    Social History  Substance Use Topics  . Smoking status: Never Smoker   . Smokeless tobacco: Never Used  . Alcohol Use: No   OB History    Gravida Para Term Preterm AB TAB SAB Ectopic Multiple Living   0              Review of Systems  Constitutional: Positive for appetite change.   Gastrointestinal: Positive for nausea, vomiting, abdominal pain and diarrhea. Negative for blood in stool.  Genitourinary: Negative.   Musculoskeletal: Negative.   Skin: Negative.   Neurological: Positive for headaches.  All other systems reviewed and are negative.   Allergies  Sulfa antibiotics; Abilify; Amoxicillin-pot clavulanate; Cephalexin; Ferrous sulfate; Latex; Tape; Zicam cold remedy; and Vicodin  Home Medications   Prior to Admission medications   Medication Sig Start Date End Date Taking? Authorizing Provider  FLUoxetine (PROZAC) 20 MG capsule Take 1 capsule (20 mg total) by mouth 2 (two) times daily. 03/16/14   Lance Bosch, NP  levothyroxine (SYNTHROID) 150 MCG tablet Take 1 tablet (150 mcg total) by mouth daily before breakfast. 08/03/14   Tresa Garter, MD  omeprazole (PRILOSEC) 20 MG capsule Take 1 capsule (20 mg total) by mouth daily. 03/16/14   Lance Bosch, NP  Prenatal Multivit-Min-Fe-FA (PRENATAL VITAMINS) 0.8 MG tablet Take 1 tablet by mouth daily. 08/17/14   Lance Bosch, NP  promethazine (PHENERGAN) 25 MG tablet Take 1 tablet (25 mg total) by mouth every 6 (six) hours as needed for nausea or vomiting. 10/15/14   Billy Fischer, MD   Meds Ordered and Administered this Visit   Medications  ondansetron (ZOFRAN-ODT) disintegrating tablet 8 mg (8 mg Oral Given 10/15/14 1924)    BP  122/88 mmHg  Pulse 103  Temp(Src) 98.5 F (36.9 C) (Oral)  Resp 16  SpO2 99% No data found.   Physical Exam  Constitutional: She is oriented to person, place, and time. She appears well-developed and well-nourished. No distress.  Abdominal: Soft. Normal appearance. She exhibits no distension and no mass. Bowel sounds are increased. There is no hepatosplenomegaly. There is generalized tenderness. There is no rigidity, no rebound, no guarding and no CVA tenderness.  Neurological: She is alert and oriented to person, place, and time.  Skin: Skin is warm and dry.  Nursing note  and vitals reviewed.   ED Course  Procedures (including critical care time)  Labs Review Labs Reviewed - No data to display  Imaging Review No results found.   Visual Acuity Review  Right Eye Distance:   Left Eye Distance:   Bilateral Distance:    Right Eye Near:   Left Eye Near:    Bilateral Near:         MDM   1. Gastroenteritis, acute    rx phenergan,     Billy Fischer, MD 10/15/14 [phone]

## 2014-10-15 NOTE — ED Notes (Signed)
Pt  reports  Symptoms     Of  Vomiting   Diarrhea          With  Decreased  Appetite as  Well  As  Low  abd  Pain       diarrhea  x3 vomiting     X  4        Onset of  Symptoms  X   3  Days

## 2014-12-17 ENCOUNTER — Inpatient Hospital Stay (HOSPITAL_COMMUNITY)
Admission: AD | Admit: 2014-12-17 | Discharge: 2014-12-17 | Disposition: A | Discharge status: Home / Self Care | Payer: Self-pay | Source: Ambulatory Visit | Attending: Family Medicine | Admitting: Family Medicine

## 2014-12-17 ENCOUNTER — Encounter (HOSPITAL_COMMUNITY): Payer: Self-pay | Admitting: *Deleted

## 2014-12-17 DIAGNOSIS — Z880 Allergy status to penicillin: Secondary | ICD-10-CM | POA: Insufficient documentation

## 2014-12-17 DIAGNOSIS — O99611 Diseases of the digestive system complicating pregnancy, first trimester: Secondary | ICD-10-CM | POA: Insufficient documentation

## 2014-12-17 DIAGNOSIS — O219 Vomiting of pregnancy, unspecified: Secondary | ICD-10-CM

## 2014-12-17 DIAGNOSIS — O210 Mild hyperemesis gravidarum: Secondary | ICD-10-CM | POA: Insufficient documentation

## 2014-12-17 DIAGNOSIS — A084 Viral intestinal infection, unspecified: Secondary | ICD-10-CM | POA: Insufficient documentation

## 2014-12-17 DIAGNOSIS — Z3A01 Less than 8 weeks gestation of pregnancy: Secondary | ICD-10-CM | POA: Insufficient documentation

## 2014-12-17 LAB — URINALYSIS, ROUTINE W REFLEX MICROSCOPIC
BILIRUBIN URINE: NEGATIVE
GLUCOSE, UA: NEGATIVE mg/dL
HGB URINE DIPSTICK: NEGATIVE
Ketones, ur: NEGATIVE mg/dL
Leukocytes, UA: NEGATIVE
Nitrite: NEGATIVE
Protein, ur: NEGATIVE mg/dL
Specific Gravity, Urine: >1.03 — ABNORMAL HIGH (ref 1.005–1.030)
pH: 5.5 (ref 5.0–8.0)

## 2014-12-17 LAB — POCT PREGNANCY, URINE: Preg Test, Ur: POSITIVE — AB

## 2014-12-17 MED ORDER — PROMETHAZINE HCL 25 MG/ML IJ SOLN
25.0000 mg | Freq: Once | INTRAMUSCULAR | Status: AC
  Administered 2014-12-17: 25 mg via INTRAVENOUS
  Filled 2014-12-17: qty 1

## 2014-12-17 MED ORDER — PROMETHAZINE HCL 25 MG PO TABS: 12.5000 mg | ORAL_TABLET | Freq: Four times a day (QID) | ORAL | Status: DC | PRN

## 2014-12-17 NOTE — Discharge Instructions (Signed)
Prenatal Care Providers [facility]  [phone]     Phone: [place]                      [facility]  @[place]     Phone: [phone]  Phone: [place]     Phone: [phone]  Phone: [place] [facility] [place]                hone: [phone]  Phone: [phone]         [facility] [address]      Phone: [phone]  Phone: [phone]         [place] [facility]                Phone: [phone]  [place])          Phone: [phone] [facility]   Phone: [phone]  Safe Medications in Pregnancy   Acne: Benzoyl Peroxide Salicylic Acid  Backache/Headache: Tylenol: 2 regular strength every 4 hours OR              2 Extra strength every 6 hours  Colds/Coughs/Allergies: Benadryl (alcohol free) 25 mg every 6 hours as needed Breath right strips Claritin Cepacol throat lozenges Chloraseptic throat spray Cold-Eeze- up to three times per day Cough drops, alcohol free Flonase (by prescription only) Guaifenesin Mucinex Robitussin DM (plain only, alcohol free) Saline nasal spray/drops Sudafed (pseudoephedrine) & Actifed ** use only after [redacted] weeks gestation and if you do not have high blood pressure Tylenol Vicks Vaporub Zinc lozenges Zyrtec   Constipation: Colace Ducolax suppositories Fleet enema Glycerin suppositories Metamucil Milk of magnesia Miralax Senokot Smooth move tea  Diarrhea: Kaopectate Imodium A-D  *NO pepto Bismol  Hemorrhoids: Anusol Anusol HC Preparation H Tucks  Indigestion: Tums Maalox Mylanta Zantac  Pepcid  Insomnia: Benadryl (alcohol free) 25mg  every 6 hours as needed Tylenol  PM Unisom, no Gelcaps  Leg Cramps: Tums MagGel  Nausea/Vomiting:  Bonine Dramamine Emetrol Ginger extract Sea bands Meclizine  Nausea medication to take during pregnancy:  Unisom (doxylamine succinate 25 mg tablets) Take one tablet daily at bedtime. If symptoms are not adequately controlled, the dose can be increased to a maximum recommended dose of two tablets daily (1/2 tablet in the morning, 1/2 tablet mid-afternoon and one at bedtime). Vitamin B6 100mg  tablets. Take one tablet twice a day (up to 200 mg per day).  Skin Rashes: Aveeno products Benadryl cream or 25mg  every 6 hours as needed Calamine Lotion 1% cortisone cream  Yeast infection: Gyne-lotrimin 7 Monistat 7   **If taking multiple medications, please check labels to avoid duplicating the same active ingredients **take medication as directed on the label ** Do not exceed 4000 mg of tylenol in 24 hours **Do not take medications that contain aspirin or ibuprofen     Morning Sickness Morning sickness is when you feel sick to your stomach (nauseous) during pregnancy. You may feel sick to your stomach and throw up (vomit). You may feel sick in the morning, but you can feel this way any time  of day. Some women feel very sick to their stomach and cannot stop throwing up (hyperemesis gravidarum). HOME CARE  Only take medicines as told by your doctor.  Take multivitamins as told by your doctor. Taking multivitamins before getting pregnant can stop or lessen the harshness of morning sickness.  Eat dry toast or unsalted crackers before getting out of bed.  Eat 5 to 6 small meals a day.  Eat dry and bland foods like rice and baked potatoes.  Do not drink liquids with meals. Drink between meals.  Do not eat greasy, fatty, or spicy foods.  Have someone cook for you if the smell of food causes you to feel sick or throw up.  If you feel sick to your stomach after taking prenatal vitamins, take them at night or with  a snack.  Eat protein when you need a snack (nuts, yogurt, cheese).  Eat unsweetened gelatins for dessert.  Wear a bracelet used for sea sickness (acupressure wristband).  Go to a doctor that puts thin needles into certain body points (acupuncture) to improve how you feel.  Do not smoke.  Use a humidifier to keep the air in your house free of odors.  Get lots of fresh air. GET HELP IF:  You need medicine to feel better.  You feel dizzy or lightheaded.  You are losing weight. GET HELP RIGHT AWAY IF:   You feel very sick to your stomach and cannot stop throwing up.  You pass out (faint). MAKE SURE YOU:  Understand these instructions.  Will watch your condition.  Will get help right away if you are not doing well or get worse.   This information is not intended to replace advice given to you by your health care provider. Make sure you discuss any questions you have with your health care provider.   Document Released: 02/13/2004 Document Revised: 01/26/2014 Document Reviewed: 06/22/2012 Elsevier Interactive Patient Education [employer].

## 2014-12-17 NOTE — MAU Note (Signed)
Patient presents to MAU with c/o nausea, vomiting, and diarrhea x2 days. Endorses mother being sick with similar symptoms recently. States unable to keep anything down to eat or drink. Denies fever. +HPT 1 week ago. Denies vaginal bleeding or pain at the present time.

## 2014-12-17 NOTE — MAU Note (Signed)
Last LMP October 10th per patient.

## 2014-12-17 NOTE — MAU Provider Note (Signed)
History     CSN: [id number]  Arrival date and time: 12/17/14 2032   First Provider Initiated Contact with Patient 12/17/14 2130      Chief Complaint  Patient presents with  . Nausea  . Emesis  . Diarrhea  . Possible Pregnancy   HPI Comments: Kimberly [name] is a 24 y.o. G1P0 at [redacted]w[redacted]d who presents today with nausea/vomiting/diarrhea. She states that she has had nausea and vomiting for over a week. However, the last two days it has worsened, and she has had diarrhea. She states that her mother has been sick as well.   Emesis  This is a new problem. The current episode started in the past 7 days. The problem occurs 5 to 10 times per day. The problem has been gradually worsening. The emesis has an appearance of stomach contents. There has been no fever. Associated symptoms include diarrhea. Pertinent negatives include no abdominal pain, chills or fever. Risk factors include ill contacts. She has tried nothing for the symptoms.  Diarrhea  This is a new problem. The current episode started yesterday. The problem occurs 2 to 4 times per day. The stool consistency is described as watery. The patient states that diarrhea awakens her from sleep. Associated symptoms include vomiting. Pertinent negatives include no abdominal pain, chills or fever. Risk factors include ill contacts. She has tried nothing for the symptoms.     Past Medical History  Diagnosis Date  . Gastric reflux   . Anxiety   . Depression   . Scoliosis   . 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 Complication of anesthesia     "takes alot to put me to sleep; woke up completely mid endoscopy" (11/16/2012)  . Asthma     "grew out of it" (11/16/2012)  . Iron deficiency anemia   . Migraines     "weekly" (11/16/2012)  . Schizophrenia ([place])     "moderate" (11/16/2012)  . Dysrhythmia     ST (11/16/2012)    Past Surgical History  Procedure Laterality Date  . Upper gi endoscopy  ~ 2006; ~2008    Family  History  Problem Relation Age of Onset  . Thyroid disease Maternal Grandmother   . Diabetes Mother   . Hypertension Mother   . Vision loss Mother   . Hypertension Father   . Hyperlipidemia Father     Social History  Substance Use Topics  . Smoking status: Never Smoker   . Smokeless tobacco: Never Used  . Alcohol Use: No    Allergies:  Allergies  Allergen Reactions  . Sulfa Antibiotics Swelling and Other (See Comments)  . Abilify [Aripiprazole]     Lethargy, unable to function  . Amoxicillin-Pot Clavulanate Other (See Comments)    Stomach pains  . Cephalexin Other (See Comments)    Stomach pains   . Ferrous Sulfate Nausea And Vomiting  . Latex     Swelling, burning of skin .   Marland Kitchen Tape     Swelling, burning of skin .   Marland Kitchen Zicam Cold Remedy [Erysidoron #1] Nausea And Vomiting  . Vicodin [Hydrocodone-Acetaminophen] Hives and Rash    Prescriptions prior to admission  Medication Sig Dispense Refill Last Dose  . FLUoxetine (PROZAC) 20 MG capsule Take 1 capsule (20 mg total) by mouth 2 (two) times daily. 60 capsule 5 Taking  . levothyroxine (SYNTHROID) 150 MCG tablet Take 1 tablet (150 mcg total) by mouth daily before breakfast. 90 tablet 3 Taking  . omeprazole (PRILOSEC) 20  MG capsule Take 1 capsule (20 mg total) by mouth daily. 30 capsule 5 Taking  . Prenatal Multivit-Min-Fe-FA (PRENATAL VITAMINS) 0.8 MG tablet Take 1 tablet by mouth daily. 30 tablet 3   . promethazine (PHENERGAN) 25 MG tablet Take 1 tablet (25 mg total) by mouth every 6 (six) hours as needed for nausea or vomiting. 15 tablet 0     Review of Systems  Constitutional: Negative for fever and chills.  Gastrointestinal: Positive for nausea, vomiting and diarrhea. Negative for abdominal pain and constipation.  Genitourinary: Negative for dysuria, urgency and frequency.   Physical Exam   Blood pressure 135/82, pulse 96, temperature 98.4 F (36.9 C), temperature source Oral, resp. rate 18, last menstrual period  10/29/2014, SpO2 100 %.  Physical Exam  Nursing note and vitals reviewed. Constitutional: She is oriented to person, place, and time. She appears well-developed and well-nourished. No distress.  HENT:  Head: Normocephalic.  Cardiovascular: Normal rate.   Respiratory: Effort normal.  GI: Soft. There is no tenderness.  Neurological: She is alert and oriented to person, place, and time.  Skin: Skin is warm and dry.  Psychiatric: She has a normal mood and affect.   Results for orders placed or performed during the hospital encounter of 12/17/14 (from the past 24 hour(s))  Urinalysis, Routine w reflex microscopic (not at [facility])     Status: Abnormal   Collection Time: 12/17/14  9:09 PM  Result Value Ref Range   Color, Urine YELLOW YELLOW   APPearance CLEAR CLEAR   Specific Gravity, Urine >1.030 (H) 1.005 - 1.030   pH 5.5 5.0 - 8.0   Glucose, UA NEGATIVE NEGATIVE mg/dL   Hgb urine dipstick NEGATIVE NEGATIVE   Bilirubin Urine NEGATIVE NEGATIVE   Ketones, ur NEGATIVE NEGATIVE mg/dL   Protein, ur NEGATIVE NEGATIVE mg/dL   Nitrite NEGATIVE NEGATIVE   Leukocytes, UA NEGATIVE NEGATIVE  Pregnancy, urine POC     Status: Abnormal   Collection Time: 12/17/14  9:21 PM  Result Value Ref Range   Preg Test, Ur POSITIVE (A) NEGATIVE    MAU Course  Procedures  MDM Patient has had phenergan and LR bolus. She reports feeling better.   Assessment and Plan   1. Viral gastroenteritis   2. Nausea and vomiting during pregnancy prior to [redacted] weeks gestation    DC home Comfort measures reviewed  1st Trimester precautions  Bleeding precautions Ectopic precautions RX: phenergan PRN #30 with 3 RF  Return to MAU as needed FU with OB as planned  Follow-up Information    Schedule an appointment as soon as possible for a visit with [facility].   Contact information:   [place] [address] [phone]       Mathis Bud 12/17/2014, 9:32 PM

## 2015-01-07 ENCOUNTER — Encounter: Payer: Self-pay | Admitting: Internal Medicine

## 2015-01-07 ENCOUNTER — Inpatient Hospital Stay (HOSPITAL_COMMUNITY)
Admission: AD | Admit: 2015-01-07 | Discharge: 2015-01-07 | Disposition: A | Discharge status: Home / Self Care | Payer: Self-pay | Source: Ambulatory Visit | Attending: Obstetrics and Gynecology | Admitting: Obstetrics and Gynecology

## 2015-01-07 ENCOUNTER — Ambulatory Visit: Payer: Self-pay | Attending: Internal Medicine | Admitting: Internal Medicine

## 2015-01-07 ENCOUNTER — Encounter (HOSPITAL_COMMUNITY): Payer: Self-pay | Admitting: *Deleted

## 2015-01-07 ENCOUNTER — Inpatient Hospital Stay (HOSPITAL_COMMUNITY): Payer: Self-pay

## 2015-01-07 VITALS — BP 119/81 | HR 97 | Temp 98.7°F | Resp 17 | Ht 64.0 in | Wt 254.0 lb

## 2015-01-07 DIAGNOSIS — O26899 Other specified pregnancy related conditions, unspecified trimester: Secondary | ICD-10-CM

## 2015-01-07 DIAGNOSIS — Z888 Allergy status to other drugs, medicaments and biological substances status: Secondary | ICD-10-CM | POA: Insufficient documentation

## 2015-01-07 DIAGNOSIS — R109 Unspecified abdominal pain: Secondary | ICD-10-CM

## 2015-01-07 DIAGNOSIS — N949 Unspecified condition associated with female genital organs and menstrual cycle: Secondary | ICD-10-CM

## 2015-01-07 DIAGNOSIS — Z91048 Other nonmedicinal substance allergy status: Secondary | ICD-10-CM | POA: Insufficient documentation

## 2015-01-07 DIAGNOSIS — Z885 Allergy status to narcotic agent status: Secondary | ICD-10-CM | POA: Insufficient documentation

## 2015-01-07 DIAGNOSIS — Z880 Allergy status to penicillin: Secondary | ICD-10-CM | POA: Insufficient documentation

## 2015-01-07 DIAGNOSIS — Z91040 Latex allergy status: Secondary | ICD-10-CM | POA: Insufficient documentation

## 2015-01-07 DIAGNOSIS — M419 Scoliosis, unspecified: Secondary | ICD-10-CM | POA: Insufficient documentation

## 2015-01-07 DIAGNOSIS — Z881 Allergy status to other antibiotic agents status: Secondary | ICD-10-CM | POA: Insufficient documentation

## 2015-01-07 DIAGNOSIS — F419 Anxiety disorder, unspecified: Secondary | ICD-10-CM | POA: Insufficient documentation

## 2015-01-07 DIAGNOSIS — Z3490 Encounter for supervision of normal pregnancy, unspecified, unspecified trimester: Secondary | ICD-10-CM

## 2015-01-07 DIAGNOSIS — F329 Major depressive disorder, single episode, unspecified: Secondary | ICD-10-CM | POA: Insufficient documentation

## 2015-01-07 DIAGNOSIS — E039 Hypothyroidism, unspecified: Secondary | ICD-10-CM | POA: Insufficient documentation

## 2015-01-07 DIAGNOSIS — Z3A09 9 weeks gestation of pregnancy: Secondary | ICD-10-CM | POA: Insufficient documentation

## 2015-01-07 DIAGNOSIS — R102 Pelvic and perineal pain: Secondary | ICD-10-CM | POA: Insufficient documentation

## 2015-01-07 DIAGNOSIS — Z9109 Other allergy status, other than to drugs and biological substances: Secondary | ICD-10-CM | POA: Insufficient documentation

## 2015-01-07 DIAGNOSIS — F209 Schizophrenia, unspecified: Secondary | ICD-10-CM | POA: Insufficient documentation

## 2015-01-07 DIAGNOSIS — K219 Gastro-esophageal reflux disease without esophagitis: Secondary | ICD-10-CM | POA: Insufficient documentation

## 2015-01-07 DIAGNOSIS — Z3491 Encounter for supervision of normal pregnancy, unspecified, first trimester: Secondary | ICD-10-CM

## 2015-01-07 DIAGNOSIS — N83292 Other ovarian cyst, left side: Secondary | ICD-10-CM | POA: Insufficient documentation

## 2015-01-07 DIAGNOSIS — J45909 Unspecified asthma, uncomplicated: Secondary | ICD-10-CM | POA: Insufficient documentation

## 2015-01-07 DIAGNOSIS — Z79899 Other long term (current) drug therapy: Secondary | ICD-10-CM | POA: Insufficient documentation

## 2015-01-07 DIAGNOSIS — O26891 Other specified pregnancy related conditions, first trimester: Secondary | ICD-10-CM | POA: Insufficient documentation

## 2015-01-07 DIAGNOSIS — O3481 Maternal care for other abnormalities of pelvic organs, first trimester: Secondary | ICD-10-CM | POA: Insufficient documentation

## 2015-01-07 DIAGNOSIS — Z331 Pregnant state, incidental: Secondary | ICD-10-CM | POA: Insufficient documentation

## 2015-01-07 DIAGNOSIS — N83202 Unspecified ovarian cyst, left side: Secondary | ICD-10-CM

## 2015-01-07 LAB — URINALYSIS, ROUTINE W REFLEX MICROSCOPIC
Bilirubin Urine: NEGATIVE
GLUCOSE, UA: NEGATIVE mg/dL
Hgb urine dipstick: NEGATIVE
Ketones, ur: NEGATIVE mg/dL
LEUKOCYTES UA: NEGATIVE
Nitrite: NEGATIVE
PH: 6 (ref 5.0–8.0)
Protein, ur: NEGATIVE mg/dL
Specific Gravity, Urine: 1.025 (ref 1.005–1.030)

## 2015-01-07 LAB — CBC
HEMATOCRIT: 35 % — AB (ref 36.0–46.0)
HEMOGLOBIN: 11.2 g/dL — AB (ref 12.0–15.0)
MCH: 26.8 pg (ref 26.0–34.0)
MCHC: 32 g/dL (ref 30.0–36.0)
MCV: 83.7 fL (ref 78.0–100.0)
Platelets: 350 10*3/uL (ref 150–400)
RBC: 4.18 MIL/uL (ref 3.87–5.11)
RDW: 14.7 % (ref 11.5–15.5)
WBC: 9.5 10*3/uL (ref 4.0–10.5)

## 2015-01-07 MED ORDER — PRENATAL VITAMINS 0.8 MG PO TABS: 1.0000 | ORAL_TABLET | Freq: Every day | ORAL | Status: DC

## 2015-01-07 NOTE — Progress Notes (Signed)
Patient here for referral to ob-gyn Recently diagnosed through [facility] with her first pregnancy Would like to have an ultra sound to see how far along she is

## 2015-01-07 NOTE — MAU Note (Signed)
Patient has been seen in MAU for N/V unsure of how far pregnant, denies vaginal bleeding or discharge, lower abdominal discomfort with pressure and cramping intermittently. Has not vomited in 2 days.

## 2015-01-07 NOTE — MAU Provider Note (Signed)
History     CSN: [id number]  Arrival date and time: 01/07/15 1127   First Provider Initiated Contact with Patient 01/07/15 1142      Chief Complaint  Patient presents with  . Abdominal Pain   HPI Kimberly [name] is 24 y.o. G1P0 [redacted]w[redacted]d weeks presenting with lower abdominal pressure, describes as cramping and intermittent that began 1 week ago.  Hx of nausea and vomiting treated with Phenergan.  Med helps no vomiting X 2 days.  Treated several weeks ago here with IV fluids.  Neg for vaginal bleeding.  Is concerned because this is a new sxs.  Has not begun prenatal care, [facility]    Past Medical History  Diagnosis Date  . Gastric reflux   . Anxiety   . Depression   . Scoliosis   . 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 Complication of anesthesia     "takes alot to put me to sleep; woke up completely mid endoscopy" (11/16/2012)  . Asthma     "grew out of it" (11/16/2012)  . Iron deficiency anemia   . Migraines     "weekly" (11/16/2012)  . Schizophrenia ([place])     "moderate" (11/16/2012)  . Dysrhythmia     ST (11/16/2012)    Past Surgical History  Procedure Laterality Date  . Upper gi endoscopy  ~ 2006; ~2008    Family History  Problem Relation Age of Onset  . Thyroid disease Maternal Grandmother   . Diabetes Mother   . Hypertension Mother   . Vision loss Mother   . Hypertension Father   . Hyperlipidemia Father     Social History  Substance Use Topics  . Smoking status: Never Smoker   . Smokeless tobacco: Never Used  . Alcohol Use: No    Allergies:  Allergies  Allergen Reactions  . Sulfa Antibiotics Swelling and Other (See Comments)  . Abilify [Aripiprazole]     Lethargy, unable to function  . Amoxicillin-Pot Clavulanate Other (See Comments)    Stomach pains  . Cephalexin Other (See Comments)    Stomach pains   . Ferrous Sulfate Nausea And Vomiting  . Latex     Swelling, burning of skin .   Marland Kitchen Tape     Swelling,  burning of skin .   Marland Kitchen Zicam Cold Remedy [Erysidoron #1] Nausea And Vomiting  . Vicodin [Hydrocodone-Acetaminophen] Hives and Rash    Prescriptions prior to admission  Medication Sig Dispense Refill Last Dose  . FLUoxetine (PROZAC) 20 MG capsule Take 1 capsule (20 mg total) by mouth 2 (two) times daily. 60 capsule 5 01/06/2015 at Unknown time  . levothyroxine (SYNTHROID) 150 MCG tablet Take 1 tablet (150 mcg total) by mouth daily before breakfast. 90 tablet 3 01/06/2015 at Unknown time  . omeprazole (PRILOSEC) 20 MG capsule Take 1 capsule (20 mg total) by mouth daily. 30 capsule 5 01/06/2015 at Unknown time  . Prenatal Multivit-Min-Fe-FA (PRENATAL VITAMINS) 0.8 MG tablet Take 1 tablet by mouth daily. 30 tablet 3 01/06/2015 at Unknown time  . promethazine (PHENERGAN) 25 MG tablet Take 0.5-1 tablets (12.5-25 mg total) by mouth every 6 (six) hours as needed. 30 tablet 3 01/07/2015 at Unknown time  . Prenatal Multivit-Min-Fe-FA (PRENATAL VITAMINS) 0.8 MG tablet Take 1 tablet by mouth daily. (Patient not taking: Reported on 01/07/2015) 30 tablet 4 Not Taking at Unknown time  . promethazine (PHENERGAN) 25 MG tablet Take 1 tablet (25 mg total) by mouth every 6 (six) hours  as needed for nausea or vomiting. (Patient not taking: Reported on 01/07/2015) 15 tablet 0 Not Taking at Unknown time    Review of Systems  Constitutional: Negative for fever and chills.  Gastrointestinal: Positive for abdominal pain (mid lower pelvic pressure). Negative for nausea and vomiting.  Genitourinary: Negative for dysuria, urgency, frequency and hematuria.       Negative for vaginal discharge or bleeding.  Musculoskeletal: Negative for back pain.  Neurological: Negative for headaches.   Physical Exam   Blood pressure 136/84, pulse 99, temperature 98.3 F (36.8 C), temperature source Oral, resp. rate 18, last menstrual period 10/29/2014.  Physical Exam  Constitutional: She is oriented to person, place, and time. She  appears well-developed and well-nourished. No distress.  HENT:  Head: Normocephalic.  Neck: Normal range of motion. Neck supple.  Cardiovascular: Normal rate, regular rhythm and normal heart sounds.   Respiratory: Effort normal and breath sounds normal. No respiratory distress.  GI: Soft. There is tenderness (mild suprapubic discomfort on exam.).  Genitourinary: There is no rash, tenderness or lesion on the right labia. There is no rash, tenderness or lesion on the left labia. Uterus is enlarged. Uterus is not tender. Cervix exhibits no motion tenderness, no discharge and no friability. Right adnexum displays no mass, no tenderness and no fullness. Left adnexum displays no mass, no tenderness and no fullness. No erythema, tenderness or bleeding in the vagina. No vaginal discharge found.  Musculoskeletal: Normal range of motion. She exhibits no edema.  Neurological: She is alert and oriented to person, place, and time.  Skin: Skin is warm and dry.  Psychiatric: She has a normal mood and affect. Her behavior is normal.   Results for orders placed or performed during the hospital encounter of 01/07/15 (from the past 24 hour(s))  Urinalysis, Routine w reflex microscopic (not at [facility])     Status: Abnormal   Collection Time: 01/07/15 11:30 AM  Result Value Ref Range   Color, Urine YELLOW YELLOW   APPearance CLOUDY (A) CLEAR   Specific Gravity, Urine 1.025 1.005 - 1.030   pH 6.0 5.0 - 8.0   Glucose, UA NEGATIVE NEGATIVE mg/dL   Hgb urine dipstick NEGATIVE NEGATIVE   Bilirubin Urine NEGATIVE NEGATIVE   Ketones, ur NEGATIVE NEGATIVE mg/dL   Protein, ur NEGATIVE NEGATIVE mg/dL   Nitrite NEGATIVE NEGATIVE   Leukocytes, UA NEGATIVE NEGATIVE  CBC     Status: Abnormal   Collection Time: 01/07/15 12:08 PM  Result Value Ref Range   WBC 9.5 4.0 - 10.5 K/uL   RBC 4.18 3.87 - 5.11 MIL/uL   Hemoglobin 11.2 (L) 12.0 - 15.0 g/dL   HCT 35.0 (L) 36.0 - 46.0 %   MCV 83.7 78.0 - 100.0 fL   MCH 26.8 26.0 -  34.0 pg   MCHC 32.0 30.0 - 36.0 g/dL   RDW 14.7 11.5 - 15.5 %   Platelets 350 150 - 400 K/uL   US Ob Comp Less 14 Wks  01/07/2015  CLINICAL DATA:  Lower abdominal/ pelvic cramping. History of left ovarian cyst. Quantitative beta HCG not ordered. Per history, patient is pregnant with estimated gestational age [redacted] weeks 0 days per LMP. EXAM: OBSTETRIC <14 WK [place] AND TRANSVAGINAL OB US TECHNIQUE: Both transabdominal and transvaginal ultrasound examinations were performed for complete evaluation of the gestation as well as the maternal uterus, adnexal regions, and pelvic cul-de-sac. Transvaginal technique was performed to assess early pregnancy. COMPARISON:  CT 11/16/2012 FINDINGS: Intrauterine gestational sac: Visualized/normal in shape. Yolk  sac:  Visualized. Embryo:  Visualized. Cardiac Activity: Visualized. Heart Rate: 184  bpm CRL:  2.71  cm   9 w   4 d                  [place] EDC: 08/08/2015 Subchorionic hemorrhage:  None visualized. Maternal uterus/adnexae: Right ovary is normal. Simple left ovarian cyst measuring 8.5 x 11.9 x 13.3 cm as these measurements are slightly larger compared to the previous CT scan from 11/16/2012 (8.4 x 10.4 x 11.7 cm). No free pelvic fluid. IMPRESSION: Single live IUP with estimated gestational age [redacted] weeks 4 days. Slight increase in size of known simple left ovarian cyst measuring 8.5 x 11.9 x 13.3 cm (previously 8.4 x 10.4 x 11.7 cm by CT 11/16/2012). Electronically Signed   By: Marin Olp M.D.   On: 01/07/2015 13:31   US Ob Transvaginal  01/07/2015  CLINICAL DATA:  Lower abdominal/ pelvic cramping. History of left ovarian cyst. Quantitative beta HCG not ordered. Per history, patient is pregnant with estimated gestational age [redacted] weeks 0 days per LMP. EXAM: OBSTETRIC <14 WK [place] AND TRANSVAGINAL OB US TECHNIQUE: Both transabdominal and transvaginal ultrasound examinations were performed for complete evaluation of the gestation as well as the maternal uterus, adnexal regions, and  pelvic cul-de-sac. Transvaginal technique was performed to assess early pregnancy. COMPARISON:  CT 11/16/2012 FINDINGS: Intrauterine gestational sac: Visualized/normal in shape. Yolk sac:  Visualized. Embryo:  Visualized. Cardiac Activity: Visualized. Heart Rate: 184  bpm CRL:  2.71  cm   9 w   4 d                  [place] EDC: 08/08/2015 Subchorionic hemorrhage:  None visualized. Maternal uterus/adnexae: Right ovary is normal. Simple left ovarian cyst measuring 8.5 x 11.9 x 13.3 cm as these measurements are slightly larger compared to the previous CT scan from 11/16/2012 (8.4 x 10.4 x 11.7 cm). No free pelvic fluid. IMPRESSION: Single live IUP with estimated gestational age [redacted] weeks 4 days. Slight increase in size of known simple left ovarian cyst measuring 8.5 x 11.9 x 13.3 cm (previously 8.4 x 10.4 x 11.7 cm by CT 11/16/2012). Electronically Signed   By: Marin Olp M.D.   On: 01/07/2015 13:31    NOTE History of ovarian cyst on left, slight increase.  Her pain is suprapubic, without adnexal pain.   MAU Course  Procedures  GC/Chl culture pending.  Wet prep was not done--neg for abnormal appearing d/c  MDM:  MSE LABs Exam U/S  Assessment and Plan  A:  Lower pelvic pressure in pregnancy      Normal UA      Viable intrauterine pregnancy  9.[redacted]  weeks gestation       Simple left ovarian cyst  P:  Discussed U/S findings-cyst slightly larger that findings in 2014.        Encouraged patient to return for worsening pain      Continue prenatal vitamins      Begin prenatal care with MD of her choice.--She asked for clinic's number-given.      KEY,EVE M 01/07/2015, 1:59 PM

## 2015-01-07 NOTE — Discharge Instructions (Signed)
Abdominal Pain During Pregnancy Abdominal pain is common in pregnancy. Most of the time, it does not cause harm. There are many causes of abdominal pain. Some causes are more serious than others. Some of the causes of abdominal pain in pregnancy are easily diagnosed. Occasionally, the diagnosis takes time to understand. Other times, the cause is not determined. Abdominal pain can be a sign that something is very wrong with the pregnancy, or the pain may have nothing to do with the pregnancy at all. For this reason, always tell your health care provider if you have any abdominal discomfort. HOME CARE INSTRUCTIONS  Monitor your abdominal pain for any changes. The following actions may help to alleviate any discomfort you are experiencing:  Do not have sexual intercourse or put anything in your vagina until your symptoms go away completely.  Get plenty of rest until your pain improves.  Drink clear fluids if you feel nauseous. Avoid solid food as long as you are uncomfortable or nauseous.  Only take over-the-counter or prescription medicine as directed by your health care provider.  Keep all follow-up appointments with your health care provider. SEEK IMMEDIATE MEDICAL CARE IF:  You are bleeding, leaking fluid, or passing tissue from the vagina.  You have increasing pain or cramping.  You have persistent vomiting.  You have painful or bloody urination.  You have a fever.  You notice a decrease in your baby's movements.  You have extreme weakness or feel faint.  You have shortness of breath, with or without abdominal pain.  You develop a severe headache with abdominal pain.  You have abnormal vaginal discharge with abdominal pain.  You have persistent diarrhea.  You have abdominal pain that continues even after rest, or gets worse. MAKE SURE YOU:   Understand these instructions.  Will watch your condition.  Will get help right away if you are not doing well or get worse.     This information is not intended to replace advice given to you by your health care provider. Make sure you discuss any questions you have with your health care provider.   Document Released: 01/05/2005 Document Revised: 10/26/2012 Document Reviewed: 08/04/2012 Elsevier Interactive Patient Education 2016 [employer] Ovarian Cyst An ovarian cyst is a sac filled with fluid or blood. This sac is attached to the ovary. Some cysts go away on their own. Other cysts need treatment.  HOME CARE   Only take medicine as told by your doctor.  Follow up with your doctor as told.  Get regular pelvic exams and Pap tests. GET HELP IF:  Your periods are late, not regular, or painful.  You stop having periods.  Your belly (abdominal) or pelvic pain does not go away.  Your belly becomes large or puffy (swollen).  You have a hard time peeing (totally emptying your bladder).  You have pressure on your bladder.  You have pain during sex.  You feel fullness, pressure, or discomfort in your belly.  You lose weight for no reason.  You feel sick most of the time.  You have a hard time pooping (constipation).  You do not feel like eating.  You develop pimples (acne).  You have an increase in hair on your body and face.  You are gaining weight for no reason.  You think you are pregnant. GET HELP RIGHT AWAY IF:   Your belly pain gets worse.  You feel sick to your stomach (nauseous), and you throw up (vomit).  You have a fever that  comes on fast.  You have belly pain while pooping (bowel movement).  Your periods are heavier than usual. MAKE SURE YOU:   Understand these instructions.  Will watch your condition.  Will get help right away if you are not doing well or get worse.   This information is not intended to replace advice given to you by your health care provider. Make sure you discuss any questions you have with your health care provider.   Document Released:  06/24/2007 Document Revised: 10/26/2012 Document Reviewed: 09/12/2012 Elsevier Interactive Patient Education [employer].

## 2015-01-07 NOTE — Progress Notes (Signed)
Patient ID: Kimberly [name], female   DOB: October 22, 1990, 24 y.o.   MRN: [id number]  CC: pregnant  HPI: Kimberly [name] is a 24 y.o. female here today for a follow up visit.  Patient has past medical history of thyroid disease. Patient was seen in the ER on 11/28 and was found to have a positive pregnancy test. She would like a referral to OB/GYN. LMP was 11/02/14. Patient has been taking prenatal vitamins daily. She is requesting a refill.  Patient has No headache, No chest pain, No abdominal pain - No Nausea, No new weakness tingling or numbness, No Cough - SOB.  Allergies  Allergen Reactions  . Sulfa Antibiotics Swelling and Other (See Comments)  . Abilify [Aripiprazole]     Lethargy, unable to function  . Amoxicillin-Pot Clavulanate Other (See Comments)    Stomach pains  . Cephalexin Other (See Comments)    Stomach pains   . Ferrous Sulfate Nausea And Vomiting  . Latex     Swelling, burning of skin .   Marland Kitchen Tape     Swelling, burning of skin .   Marland Kitchen Zicam Cold Remedy [Erysidoron #1] Nausea And Vomiting  . Vicodin [Hydrocodone-Acetaminophen] Hives and Rash   Past Medical History  Diagnosis Date  . Gastric reflux   . Anxiety   . Depression   . Scoliosis   . 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 Complication of anesthesia     "takes alot to put me to sleep; woke up completely mid endoscopy" (11/16/2012)  . Asthma     "grew out of it" (11/16/2012)  . Iron deficiency anemia   . Migraines     "weekly" (11/16/2012)  . Schizophrenia ([place])     "moderate" (11/16/2012)  . Dysrhythmia     ST (11/16/2012)   Current Outpatient Prescriptions on File Prior to Visit  Medication Sig Dispense Refill  . FLUoxetine (PROZAC) 20 MG capsule Take 1 capsule (20 mg total) by mouth 2 (two) times daily. 60 capsule 5  . levothyroxine (SYNTHROID) 150 MCG tablet Take 1 tablet (150 mcg total) by mouth daily before breakfast. 90 tablet 3  . omeprazole (PRILOSEC) 20 MG capsule Take 1  capsule (20 mg total) by mouth daily. 30 capsule 5  . Prenatal Multivit-Min-Fe-FA (PRENATAL VITAMINS) 0.8 MG tablet Take 1 tablet by mouth daily. 30 tablet 3  . promethazine (PHENERGAN) 25 MG tablet Take 1 tablet (25 mg total) by mouth every 6 (six) hours as needed for nausea or vomiting. (Patient not taking: Reported on 12/17/2014) 15 tablet 0  . promethazine (PHENERGAN) 25 MG tablet Take 0.5-1 tablets (12.5-25 mg total) by mouth every 6 (six) hours as needed. 30 tablet 3  . [DISCONTINUED] bethanechol (URECHOLINE) 5 MG tablet Take 5 mg by mouth 3 (three) times daily.     . [DISCONTINUED] ferrous sulfate 325 (65 FE) MG tablet Take 1 tablet (325 mg total) by mouth daily. (Patient not taking: Reported on 03/16/2014) 30 tablet 0   No current facility-administered medications on file prior to visit.   Family History  Problem Relation Age of Onset  . Thyroid disease Maternal Grandmother   . Diabetes Mother   . Hypertension Mother   . Vision loss Mother   . Hypertension Father   . Hyperlipidemia Father    Social History   Social History  . Marital Status: Single    Spouse Name: N/A  . Number of Children: N/A  . Years of Education: N/A   Occupational History  .  Not on file.   Social History Main Topics  . Smoking status: Never Smoker   . Smokeless tobacco: Never Used  . Alcohol Use: No  . Drug Use: No  . Sexual Activity: Yes    Birth Control/ Protection: None   Other Topics Concern  . Not on file   Social History Narrative    Review of Systems: Constitutional: Negative for fever, chills, diaphoresis, activity change, appetite change and fatigue. HENT: Negative for ear pain, nosebleeds, congestion, facial swelling, rhinorrhea, neck pain, neck stiffness and ear discharge.  Eyes: Negative for pain, discharge, redness, itching and visual disturbance. Respiratory: Negative for cough, choking, chest tightness, shortness of breath, wheezing and stridor.  Cardiovascular: Negative for  chest pain, palpitations and leg swelling. Gastrointestinal: Negative for abdominal distention. Genitourinary: Negative for dysuria, urgency, frequency, hematuria, flank pain, decreased urine volume, difficulty urinating and dyspareunia.  Musculoskeletal: Negative for back pain, joint swelling, arthralgias and gait problem. Neurological: Negative for dizziness, tremors, seizures, syncope, facial asymmetry, speech difficulty, weakness, light-headedness, numbness and headaches.  Hematological: Negative for adenopathy. Does not bruise/bleed easily. Psychiatric/Behavioral: Negative for hallucinations, behavioral problems, confusion, dysphoric mood, decreased concentration and agitation.    Objective:   Filed Vitals:   01/07/15 0927  BP: 119/81  Pulse: 97  Temp: 98.7 F (37.1 C)  Resp: 17    Physical Exam  Constitutional: She is oriented to person, place, and time.  Cardiovascular: Normal rate, regular rhythm and normal heart sounds.   Pulmonary/Chest: Effort normal and breath sounds normal.  Musculoskeletal: Normal range of motion.  Neurological: She is alert and oriented to person, place, and time.  Skin: Skin is warm and dry.     Lab Results  Component Value Date   WBC 18.7* 08/07/2014   HGB 11.2* 08/07/2014   HCT 35.8* 08/07/2014   MCV 79.9 08/07/2014   PLT 344 08/07/2014   Lab Results  Component Value Date   CREATININE 0.59 08/07/2014   BUN 5* 08/07/2014   NA 135 08/07/2014   K 3.7 08/07/2014   CL 105 08/07/2014   CO2 21* 08/07/2014    Lab Results  Component Value Date   HGBA1C 5.2 02/25/2012   Lipid Panel     Component Value Date/Time   CHOL 185 08/07/2013 1141   TRIG 146 08/07/2013 1141   HDL 42 08/07/2013 1141   CHOLHDL 4.4 08/07/2013 1141   VLDL 29 08/07/2013 1141   LDLCALC 114* 08/07/2013 1141       Assessment and plan:   Kimberly [name] was seen today for referral.  Diagnoses and all orders for this visit:  Pregnancy -     Prenatal Multivit-Min-Fe-FA  (PRENATAL VITAMINS) 0.8 MG tablet; Take 1 tablet by mouth daily. Patient has been given a letter for medicaid. I have placed referral to OB/GYN.      Return if symptoms worsen or fail to improve.    Lance Bosch, [facility] [phone] 01/07/2015, 9:40 AM

## 2015-01-08 LAB — HIV ANTIBODY (ROUTINE TESTING W REFLEX): HIV SCREEN 4TH GENERATION: NONREACTIVE

## 2015-01-08 LAB — GC/CHLAMYDIA PROBE AMP (~~LOC~~) NOT AT ARMC
Chlamydia: NEGATIVE
Neisseria Gonorrhea: NEGATIVE

## 2015-01-20 NOTE — L&D Delivery Note (Signed)
Delivery Note At 10:59 PM a viable female was delivered via Vaginal, Spontaneous Delivery (Presentation: Right Occiput Anterior).  APGAR: 7, 9; weight 8 lb 2 oz (3685 g).   Placenta status: Intact, Spontaneous.  Cord: 3 vessels with the following complications: None.    Anesthesia: Epidural  Episiotomy: None Lacerations: None Est. Blood Loss (mL): 150  Mom to postpartum.  Baby to Couplet care / Skin to Skin.   Serita Grammes 07/31/2015, 11:19 PM

## 2015-01-23 ENCOUNTER — Ambulatory Visit: Payer: Self-pay | Attending: Internal Medicine | Admitting: Internal Medicine

## 2015-01-23 ENCOUNTER — Encounter: Payer: Self-pay | Admitting: Internal Medicine

## 2015-01-23 VITALS — BP 116/81 | HR 108 | Temp 98.0°F | Resp 16 | Ht 64.0 in | Wt 247.0 lb

## 2015-01-23 DIAGNOSIS — J45909 Unspecified asthma, uncomplicated: Secondary | ICD-10-CM | POA: Insufficient documentation

## 2015-01-23 DIAGNOSIS — Z882 Allergy status to sulfonamides status: Secondary | ICD-10-CM | POA: Insufficient documentation

## 2015-01-23 DIAGNOSIS — Z880 Allergy status to penicillin: Secondary | ICD-10-CM | POA: Insufficient documentation

## 2015-01-23 DIAGNOSIS — F32A Depression, unspecified: Secondary | ICD-10-CM

## 2015-01-23 DIAGNOSIS — E063 Autoimmune thyroiditis: Secondary | ICD-10-CM

## 2015-01-23 DIAGNOSIS — Z881 Allergy status to other antibiotic agents status: Secondary | ICD-10-CM | POA: Insufficient documentation

## 2015-01-23 DIAGNOSIS — F209 Schizophrenia, unspecified: Secondary | ICD-10-CM | POA: Insufficient documentation

## 2015-01-23 DIAGNOSIS — M419 Scoliosis, unspecified: Secondary | ICD-10-CM | POA: Insufficient documentation

## 2015-01-23 DIAGNOSIS — Z888 Allergy status to other drugs, medicaments and biological substances status: Secondary | ICD-10-CM | POA: Insufficient documentation

## 2015-01-23 DIAGNOSIS — R112 Nausea with vomiting, unspecified: Secondary | ICD-10-CM | POA: Insufficient documentation

## 2015-01-23 DIAGNOSIS — R5383 Other fatigue: Secondary | ICD-10-CM | POA: Insufficient documentation

## 2015-01-23 DIAGNOSIS — Z9109 Other allergy status, other than to drugs and biological substances: Secondary | ICD-10-CM | POA: Insufficient documentation

## 2015-01-23 DIAGNOSIS — Z91040 Latex allergy status: Secondary | ICD-10-CM | POA: Insufficient documentation

## 2015-01-23 DIAGNOSIS — Z3490 Encounter for supervision of normal pregnancy, unspecified, unspecified trimester: Secondary | ICD-10-CM

## 2015-01-23 DIAGNOSIS — E038 Other specified hypothyroidism: Secondary | ICD-10-CM | POA: Insufficient documentation

## 2015-01-23 DIAGNOSIS — K219 Gastro-esophageal reflux disease without esophagitis: Secondary | ICD-10-CM | POA: Insufficient documentation

## 2015-01-23 DIAGNOSIS — Z331 Pregnant state, incidental: Secondary | ICD-10-CM | POA: Insufficient documentation

## 2015-01-23 DIAGNOSIS — F329 Major depressive disorder, single episode, unspecified: Secondary | ICD-10-CM | POA: Insufficient documentation

## 2015-01-23 DIAGNOSIS — F419 Anxiety disorder, unspecified: Secondary | ICD-10-CM | POA: Insufficient documentation

## 2015-01-23 DIAGNOSIS — Z885 Allergy status to narcotic agent status: Secondary | ICD-10-CM | POA: Insufficient documentation

## 2015-01-23 MED ORDER — RANITIDINE HCL 150 MG PO TABS: 150.0000 mg | ORAL_TABLET | Freq: Every day | ORAL | Status: DC

## 2015-01-23 MED ORDER — PROMETHAZINE HCL 25 MG PO TABS: 25.0000 mg | ORAL_TABLET | Freq: Four times a day (QID) | ORAL | Status: DC | PRN

## 2015-01-23 MED ORDER — PRENATAL VITAMINS 0.8 MG PO TABS: 1.0000 | ORAL_TABLET | Freq: Every day | ORAL | Status: DC

## 2015-01-23 MED FILL — PROMETHAZINE 25 MG TABLET: 25 | 4 days supply | Qty: 15 | Fill #0

## 2015-01-23 MED FILL — raNITIdine HCL 150 MG TABS: 150 | 30 days supply | Qty: 30 | Fill #0

## 2015-01-23 NOTE — Progress Notes (Signed)
Patient ID: Kimberly [name], female   DOB: 03-17-1990, 25 y.o.   MRN: [id number]  CC: weak, nausea  HPI: Kimberly [name] is a 25 y.o. female here today for a follow up visit.  Patient has past medical history of anxiety, depression, and thyroid disease. Patient was seen by me 1 month ago and was found to have a positive pregnancy test. At that time patient was referred to OB/GYN for prenatal care but has yet to make a appointment. Since that time she has continued to take all medications such as prozac, omeprazole, synthroid.  She states that for the past couple of weeks she has been feeling drained and fatigued. She was seen in the ER one week ago for symptoms of fatigue and nausea. She was hydrated and given a script for phenergan which she is requesting a refill.  Patient is concerned about not having enough energy to work her 36 hours per week and would like a letter stating that she needs reduced hours due to her fatigue.   Allergies  Allergen Reactions  . Sulfa Antibiotics Swelling and Other (See Comments)  . Abilify [Aripiprazole]     Lethargy, unable to function  . Amoxicillin-Pot Clavulanate Other (See Comments)    Stomach pains  . Cephalexin Other (See Comments)    Stomach pains   . Ferrous Sulfate Nausea And Vomiting  . Latex     Swelling, burning of skin .   Marland Kitchen Tape     Swelling, burning of skin .   Marland Kitchen Zicam Cold Remedy [Erysidoron #1] Nausea And Vomiting  . Vicodin [Hydrocodone-Acetaminophen] Hives and Rash   Past Medical History  Diagnosis Date  . Gastric reflux   . Anxiety   . Depression   . Scoliosis   . 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 Complication of anesthesia     "takes alot to put me to sleep; woke up completely mid endoscopy" (11/16/2012)  . Asthma     "grew out of it" (11/16/2012)  . Iron deficiency anemia   . Migraines     "weekly" (11/16/2012)  . Schizophrenia ([place])     "moderate" (11/16/2012)  . Dysrhythmia     ST (11/16/2012)    Current Outpatient Prescriptions on File Prior to Visit  Medication Sig Dispense Refill  . FLUoxetine (PROZAC) 20 MG capsule Take 1 capsule (20 mg total) by mouth 2 (two) times daily. 60 capsule 5  . levothyroxine (SYNTHROID) 150 MCG tablet Take 1 tablet (150 mcg total) by mouth daily before breakfast. 90 tablet 3  . omeprazole (PRILOSEC) 20 MG capsule Take 1 capsule (20 mg total) by mouth daily. 30 capsule 5  . Prenatal Multivit-Min-Fe-FA (PRENATAL VITAMINS) 0.8 MG tablet Take 1 tablet by mouth daily. 30 tablet 4  . promethazine (PHENERGAN) 25 MG tablet Take 1 tablet (25 mg total) by mouth every 6 (six) hours as needed for nausea or vomiting. 15 tablet 0  . [DISCONTINUED] bethanechol (URECHOLINE) 5 MG tablet Take 5 mg by mouth 3 (three) times daily.     . [DISCONTINUED] ferrous sulfate 325 (65 FE) MG tablet Take 1 tablet (325 mg total) by mouth daily. (Patient not taking: Reported on 03/16/2014) 30 tablet 0   No current facility-administered medications on file prior to visit.   Family History  Problem Relation Age of Onset  . Thyroid disease Maternal Grandmother   . Diabetes Mother   . Hypertension Mother   . Vision loss Mother   . Hypertension Father   .  Hyperlipidemia Father    Social History   Social History  . Marital Status: Single    Spouse Name: N/A  . Number of Children: N/A  . Years of Education: N/A   Occupational History  . Not on file.   Social History Main Topics  . Smoking status: Never Smoker   . Smokeless tobacco: Never Used  . Alcohol Use: No  . Drug Use: No  . Sexual Activity: Yes    Birth Control/ Protection: None   Other Topics Concern  . Not on file   Social History Narrative    Review of Systems  Constitutional: Positive for malaise/fatigue.  Gastrointestinal: Positive for heartburn, nausea and vomiting. Negative for abdominal pain.  Neurological: Positive for weakness.  All other systems reviewed and are negative.   Objective:   Filed  Vitals:   01/23/15 1440  BP: 116/81  Pulse: 108  Temp: 98 F (36.7 C)  Resp: 16    Physical Exam  Constitutional: No distress.  Cardiovascular: Normal rate, regular rhythm and normal heart sounds.   Pulmonary/Chest: Effort normal and breath sounds normal.  Skin: Skin is warm and dry. She is not diaphoretic.  Psychiatric: She has a normal mood and affect.    Lab Results  Component Value Date   WBC 9.5 01/07/2015   HGB 11.2* 01/07/2015   HCT 35.0* 01/07/2015   MCV 83.7 01/07/2015   PLT 350 01/07/2015   Lab Results  Component Value Date   CREATININE 0.59 08/07/2014   BUN 5* 08/07/2014   NA 135 08/07/2014   K 3.7 08/07/2014   CL 105 08/07/2014   CO2 21* 08/07/2014    Lab Results  Component Value Date   HGBA1C 5.2 02/25/2012   Lipid Panel     Component Value Date/Time   CHOL 185 08/07/2013 1141   TRIG 146 08/07/2013 1141   HDL 42 08/07/2013 1141   CHOLHDL 4.4 08/07/2013 1141   VLDL 29 08/07/2013 1141   LDLCALC 114* 08/07/2013 1141       Assessment and plan:   Kimberly [name] was seen today for fatigue.  Diagnoses and all orders for this visit:  Pregnancy -     Prenatal Multivit-Min-Fe-FA (PRENATAL VITAMINS) 0.8 MG tablet; Take 1 tablet by mouth daily. Refilled vitamins. I have reviewed EMR and found patient has a appointment with the [facility] on 02/04/15. I have spoken with Case Worker Carmela Hurt, RN who will be in contact with patient frequently to make sure she is receiving proper prenatal care since she is high risk.   Hypothyroidism, acquired, autoimmune -     TSH -     T4, Free I will recheck levels today. It is likely that her levels are off due to pregnancy. I have stressed that she gets to [facility] asap.  Depression I have explained to patient that she needs to see her OB asap to determine if it is safe fore her to remain on Prozac. Medication is low risk to fetus but she needs proper follow up for best medication choices as should be determined  by OB.   Gastroesophageal reflux disease, esophagitis presence not specified -     ranitidine (ZANTAC) 150 MG tablet; Take 1 tablet (150 mg total) by mouth daily. I have taken patient off Omeprazole and switched her to Zantac which is a safer option in pregnancy.   Nausea and vomiting, intractability of vomiting not specified, unspecified vomiting type -     promethazine (PHENERGAN) 25 MG tablet;  Take 1 tablet (25 mg total) by mouth every 6 (six) hours as needed for nausea or vomiting. I have given her a short supply of phenergan to last her until she is seen by OB.   Return if symptoms worsen or fail to improve.      Lance Bosch, [facility] [phone] 01/23/2015, 2:58 PM

## 2015-01-23 NOTE — Progress Notes (Signed)
Patient complains of feeling drained and really fatigued Nausea and has been vomiting off and on for the past couple of weeks [facility] and basically gave her fluids and sent her home

## 2015-01-23 NOTE — Patient Instructions (Addendum)
I will call you soon with TSH results  Please stop Omeprazole. I have switched you to Zantac  I have given you a limited amount of nausea medication  When you see OB they will refill all medications as appropriate

## 2015-01-24 ENCOUNTER — Telehealth: Payer: Self-pay

## 2015-01-24 DIAGNOSIS — E063 Autoimmune thyroiditis: Secondary | ICD-10-CM

## 2015-01-24 LAB — T4, FREE: FREE T4: 1.03 ng/dL (ref 0.80–1.80)

## 2015-01-24 LAB — TSH: TSH: 4.171 u[IU]/mL (ref 0.350–4.500)

## 2015-01-24 MED ORDER — LEVOTHYROXINE SODIUM 150 MCG PO TABS: 150.0000 ug | ORAL_TABLET | Freq: Every day | ORAL | Status: DC

## 2015-01-24 NOTE — Telephone Encounter (Signed)
This Case Manager was asked by Chari Manning, NP to follow-up with patient.  Call placed to patient to discuss importance of prenatal care. Patient verbalized understanding and aware of appointment on 02/04/15 at [place] with Dr. Roselie Awkward at [facility].  Patient stated she had appointment written down and would go to her appointment. Patient's medications reviewed. Patient aware that omeprazole was discontinued on 01/23/15 and Zantac was prescribed. She indicated she picked up Zantac and has stopped taking omeprazole.  She indicated she only had one Synthroid left and was waiting for her TSH results.  Patient's EMR reviewed. Note from Chari Manning on 01/24/15 indicated "TSH normal. Please refill current dose of synthroid." Patient informed of normal results. Synthroid refilled. Patient appreciative of call. No additional concerns identified.

## 2015-01-24 NOTE — Telephone Encounter (Signed)
Tried to call patient this am Patient not available Left message on voice mail to return our call

## 2015-01-24 NOTE — Telephone Encounter (Signed)
-----   Message from Lance Bosch, NP sent at 01/24/2015  8:42 AM EST ----- TSH is normal. Please refill her current dose of synthroid.

## 2015-01-25 MED FILL — $Synthroid 150mcg tablet: 150 | 30 days supply | Qty: 30 | Fill #2

## 2015-01-30 ENCOUNTER — Telehealth: Payer: Self-pay

## 2015-01-30 NOTE — Telephone Encounter (Signed)
Call placed to the patient to check on her status.  She said that she is " feeling okay." She reported that she is taking the synthroid and zantac as ordered.  She did note that for the past 4 days she has experienced about  4 episodes a day  of " throwing up acid "  She said that she is able to eat and has not been vomiting her food, just stomach acid.  She noted that she has an appointment with her "pregnancy doctor" on Monday, 02/04/15 @ 0845.  Informed her that this CM would notify Kimberly Manning, NP about the vomiting.  She was appreciative of the call and said that she would also speak to her doctor on Monday, 02/04/15 about the vomiting.   No other problems/ questions reported.    Informed Kimberly Manning, NP about the patient's report of " throwing up acid."  She stated that the patient should discuss with her OB on Monday, 1/17.  Call then placed to the patient to inform her of Kimberly Jaffe, NP instructions and a  HIPAA compliant voice mail message was left requesting a call back to # [phone] or [phone].

## 2015-01-31 ENCOUNTER — Telehealth: Payer: Self-pay

## 2015-01-31 NOTE — Telephone Encounter (Signed)
This Case Manager attempted to follow-up with patient regarding conversation with Eden Lathe, RN CM on 01/30/15 regarding vomiting up stomach acid. Per Chari Manning, NP patient needs to discuss this with her OB at her upcoming appointment on 02/04/15.  Call placed to patient to update. Unable to reach patient; voicemail left requesting return call.

## 2015-02-04 ENCOUNTER — Ambulatory Visit (INDEPENDENT_AMBULATORY_CARE_PROVIDER_SITE_OTHER): Payer: Self-pay | Admitting: Obstetrics & Gynecology

## 2015-02-04 ENCOUNTER — Encounter: Payer: Self-pay | Admitting: Obstetrics & Gynecology

## 2015-02-04 VITALS — BP 121/81 | HR 123 | Temp 98.6°F | Wt 247.7 lb

## 2015-02-04 DIAGNOSIS — Z3492 Encounter for supervision of normal pregnancy, unspecified, second trimester: Secondary | ICD-10-CM

## 2015-02-04 DIAGNOSIS — Z23 Encounter for immunization: Secondary | ICD-10-CM

## 2015-02-04 DIAGNOSIS — Z1151 Encounter for screening for human papillomavirus (HPV): Secondary | ICD-10-CM

## 2015-02-04 DIAGNOSIS — K219 Gastro-esophageal reflux disease without esophagitis: Secondary | ICD-10-CM

## 2015-02-04 LAB — POCT URINALYSIS DIP (DEVICE)
GLUCOSE, UA: NEGATIVE mg/dL
KETONES UR: NEGATIVE mg/dL
LEUKOCYTES UA: NEGATIVE
NITRITE: NEGATIVE
PH: 5.5 (ref 5.0–8.0)
PROTEIN: NEGATIVE mg/dL
Specific Gravity, Urine: >=1.03 (ref 1.005–1.030)
UROBILINOGEN UA: 0.2 mg/dL (ref 0.0–1.0)

## 2015-02-04 MED ORDER — OMEPRAZOLE 40 MG PO CPDR: 40.0000 mg | DELAYED_RELEASE_CAPSULE | Freq: Every day | ORAL | Status: DC

## 2015-02-04 NOTE — Progress Notes (Signed)
Subjective: new OB 14 weeks    Kimberly [name] is a G2P0010 [redacted]w[redacted]d being seen today for her first obstetrical visit.  Her obstetrical history is significant for obesity and GERD, h/o depression. Patient does intend to breast feed. Pregnancy history fully reviewed.  Patient reports heartburn and nausea.  Filed Vitals:   02/04/15 0900  BP: 121/81  Pulse: 123  Temp: 98.6 F (37 C)  Weight: 247 lb 11.2 oz (112.356 kg)    HISTORY: OB History  Gravida Para Term Preterm AB SAB TAB Ectopic Multiple Living  2 0   1 1    0    # Outcome Date GA Lbr Len/2nd Weight Sex Delivery Anes PTL Lv  2 Current           1 SAB 07/2014 [redacted]w[redacted]d            Past Medical History  Diagnosis Date  . Gastric reflux   . Anxiety   . Depression   . Scoliosis   . 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 Complication of anesthesia     "takes alot to put me to sleep; woke up completely mid endoscopy" (11/16/2012)  . Asthma     "grew out of it" (11/16/2012)  . Iron deficiency anemia   . Migraines     "weekly" (11/16/2012)  . Schizophrenia ([place])     "moderate" (11/16/2012)  . Dysrhythmia     ST (11/16/2012)   Past Surgical History  Procedure Laterality Date  . Upper gi endoscopy  ~ 2006; ~2008   Family History  Problem Relation Age of Onset  . Thyroid disease Maternal Grandmother   . Diabetes Mother   . Hypertension Mother   . Vision loss Mother   . Hypertension Father   . Hyperlipidemia Father      Exam    Uterus:     Pelvic Exam:    Perineum: No Hemorrhoids   Vulva: normal   Vagina:  normal mucosa   pH:    Cervix: no lesions   Adnexa: normal adnexa   Bony Pelvis: average  System: Breast:  normal appearance, no masses or tenderness   Skin: normal coloration and turgor, no rashes    Neurologic: oriented, normal mood   Extremities: normal strength, tone, and muscle mass   HEENT neck supple with midline trachea and thyroid without masses   Mouth/Teeth mucous  membranes moist, pharynx normal without lesions and dental hygiene good   Neck supple   Cardiovascular: regular rate and rhythm, no murmurs or gallops   Respiratory:  appears well, vitals normal, no respiratory distress, acyanotic, normal RR, chest clear, no wheezing, crepitations, rhonchi, normal symmetric air entry   Abdomen: soft, non-tender; bowel sounds normal; no masses,  no organomegaly   Urinary: urethral meatus normal      Assessment:    Pregnancy: G2P0010 Patient Active Problem List   Diagnosis Date Noted  . Acute gastroenteritis 11/16/2012  . Diarrhea 11/16/2012  . Tachycardia 11/16/2012  . 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 Hypothyroidism, acquired, autoimmune 03/01/2011  . Thyroiditis, autoimmune 03/01/2011  . Goiter 03/01/2011  . Acanthosis nigricans, acquired 03/01/2011  . Oligomenorrhea 03/01/2011  . Hirsutism 03/01/2011  . 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 Hypertension 03/01/2011  . GERD (gastroesophageal reflux disease) 03/01/2011  . Dyspepsia 03/01/2011  . Anxiety and depression 03/01/2011  . Pre-diabetes 07/03/2010  . Maternal morbid obesity, antepartum ([place]) 07/03/2010        Plan:  Initial labs drawn. Prenatal vitamins. Problem list reviewed and updated. Genetic Screening discussed [place] Screen: next visit.  Ultrasound discussed; fetal survey: 18+ weeks.  Follow up in 4 weeks. 50% of 12min visit spent on counseling and coordination of care.    May hold her Prozac, change to Omeprazole for GERD  Kimberly [name] 02/04/2015

## 2015-02-04 NOTE — Patient Instructions (Signed)

## 2015-02-04 NOTE — Progress Notes (Signed)
Edema- hands/feet/right sided r/t scolosis  Vaginal discharge- clear, no odor Early glucola given due to BMI > 30 New ob packet given  Flu vaccine given today

## 2015-02-05 LAB — PRENATAL PROFILE (SOLSTAS)
Antibody Screen: NEGATIVE
BASOS PCT: 0 % (ref 0–1)
Basophils Absolute: 0 10*3/uL (ref 0.0–0.1)
EOS PCT: 1 % (ref 0–5)
Eosinophils Absolute: 0.1 10*3/uL (ref 0.0–0.7)
HEMATOCRIT: 33.9 % — AB (ref 36.0–46.0)
HEMOGLOBIN: 11.1 g/dL — AB (ref 12.0–15.0)
HEP B S AG: NEGATIVE
HIV: NONREACTIVE
Lymphocytes Relative: 20 % (ref 12–46)
Lymphs Abs: 2 10*3/uL (ref 0.7–4.0)
MCH: 26.6 pg (ref 26.0–34.0)
MCHC: 32.7 g/dL (ref 30.0–36.0)
MCV: 81.1 fL (ref 78.0–100.0)
MONO ABS: 0.5 10*3/uL (ref 0.1–1.0)
MONOS PCT: 5 % (ref 3–12)
MPV: 9.4 fL (ref 8.6–12.4)
NEUTROS ABS: 7.3 10*3/uL (ref 1.7–7.7)
Neutrophils Relative %: 74 % (ref 43–77)
Platelets: 347 10*3/uL (ref 150–400)
RBC: 4.18 MIL/uL (ref 3.87–5.11)
RDW: 14.8 % (ref 11.5–15.5)
RH TYPE: POSITIVE
WBC: 9.9 10*3/uL (ref 4.0–10.5)

## 2015-02-05 LAB — GLUCOSE TOLERANCE, 1 HOUR (50G) W/O FASTING: GLUCOSE 1 HOUR GTT: 129 mg/dL (ref 70–140)

## 2015-02-06 LAB — CULTURE, OB URINE: Colony Count: 25000

## 2015-02-06 LAB — CYTOLOGY - PAP

## 2015-02-07 LAB — AFP, QUAD SCREEN
AFP: 16.6 ng/mL
CURR GEST AGE: 14 wks.days
Down Syndrome Scr Risk Est: 1:424 {titer}
HCG, Total: 95.97 IU/mL
INH: 263.1 pg/mL
Interpretation-AFP: NEGATIVE
MOM FOR AFP: 0.92
MOM FOR HCG: 1.96
MoM for INH: 1.92
Open Spina bifida: NEGATIVE
TRI 18 SCR RISK EST: NEGATIVE
UE3 MOM: 0.76
UE3 VALUE: 0.3 ng/mL

## 2015-02-15 ENCOUNTER — Telehealth: Payer: Self-pay

## 2015-02-15 NOTE — Telephone Encounter (Signed)
This Case Manager placed call to check on patient and to remind her of upcoming OB appointment on 03/04/15 at 1005 with Dr. Gala Romney.  Call placed to [phone]; unable to reach patient.  Voicemail left requesting return call.

## 2015-03-04 ENCOUNTER — Encounter: Payer: Self-pay | Admitting: Obstetrics & Gynecology

## 2015-03-05 MED FILL — PROMETHAZINE 25 MG TABLET: 25 | 8 days supply | Qty: 30 | Fill #1

## 2015-03-06 ENCOUNTER — Telehealth: Payer: Self-pay

## 2015-03-06 ENCOUNTER — Ambulatory Visit: Payer: Self-pay | Admitting: Internal Medicine

## 2015-03-06 NOTE — Telephone Encounter (Signed)
Pt called stating she is having some vomiting and diarrhea that started yesterday while at work. She states she was expose to this earlier in the week. I have advised patient to start a BRAT diet for today and clear liquids small sips for the rest of the day. Is symptoms get worse patient should call us back or come to [facility] for to be check out. Pt voice understanding.

## 2015-03-11 ENCOUNTER — Ambulatory Visit (INDEPENDENT_AMBULATORY_CARE_PROVIDER_SITE_OTHER): Payer: Self-pay | Admitting: Family Medicine

## 2015-03-11 VITALS — BP 114/55 | HR 118 | Temp 98.7°F | Wt 247.6 lb

## 2015-03-11 DIAGNOSIS — Z2839 Other underimmunization status: Secondary | ICD-10-CM | POA: Insufficient documentation

## 2015-03-11 DIAGNOSIS — E079 Disorder of thyroid, unspecified: Secondary | ICD-10-CM

## 2015-03-11 DIAGNOSIS — O09899 Supervision of other high risk pregnancies, unspecified trimester: Secondary | ICD-10-CM

## 2015-03-11 DIAGNOSIS — I159 Secondary hypertension, unspecified: Secondary | ICD-10-CM

## 2015-03-11 DIAGNOSIS — Z283 Underimmunization status: Secondary | ICD-10-CM

## 2015-03-11 DIAGNOSIS — O9989 Other specified diseases and conditions complicating pregnancy, childbirth and the puerperium: Secondary | ICD-10-CM

## 2015-03-11 DIAGNOSIS — O0992 Supervision of high risk pregnancy, unspecified, second trimester: Secondary | ICD-10-CM

## 2015-03-11 DIAGNOSIS — R7303 Prediabetes: Secondary | ICD-10-CM

## 2015-03-11 DIAGNOSIS — K219 Gastro-esophageal reflux disease without esophagitis: Secondary | ICD-10-CM

## 2015-03-11 DIAGNOSIS — O99282 Endocrine, nutritional and metabolic diseases complicating pregnancy, second trimester: Secondary | ICD-10-CM

## 2015-03-11 DIAGNOSIS — O99280 Endocrine, nutritional and metabolic diseases complicating pregnancy, unspecified trimester: Secondary | ICD-10-CM

## 2015-03-11 DIAGNOSIS — O0990 Supervision of high risk pregnancy, unspecified, unspecified trimester: Secondary | ICD-10-CM | POA: Insufficient documentation

## 2015-03-11 LAB — POCT URINALYSIS DIP (DEVICE)
Glucose, UA: NEGATIVE mg/dL
Ketones, ur: NEGATIVE mg/dL
NITRITE: NEGATIVE
PROTEIN: NEGATIVE mg/dL
SPECIFIC GRAVITY, URINE: 1.025 (ref 1.005–1.030)
UROBILINOGEN UA: 0.2 mg/dL (ref 0.0–1.0)
pH: 6 (ref 5.0–8.0)

## 2015-03-11 MED ORDER — OMEPRAZOLE 40 MG PO CPDR: 40.0000 mg | DELAYED_RELEASE_CAPSULE | Freq: Every day | ORAL | Status: DC

## 2015-03-11 MED ORDER — CONCEPT OB 130-92.4-1 MG PO CAPS: 1.0000 | ORAL_CAPSULE | Freq: Every day | ORAL | Status: DC

## 2015-03-11 NOTE — Patient Instructions (Signed)
Second Trimester of Pregnancy The second trimester is from week 13 through week 28, months 4 through 6. The second trimester is often a time when you feel your best. Your body has also adjusted to being pregnant, and you begin to feel better physically. Usually, morning sickness has lessened or quit completely, you may have more energy, and you may have an increase in appetite. The second trimester is also a time when the fetus is growing rapidly. At the end of the sixth month, the fetus is about 9 inches long and weighs about 1 pounds. You will likely begin to feel the baby move (quickening) between 18 and 20 weeks of the pregnancy. BODY CHANGES Your body goes through many changes during pregnancy. The changes vary from woman to woman.   Your weight will continue to increase. You will notice your lower abdomen bulging out.  You may begin to get stretch marks on your hips, abdomen, and breasts.  You may develop headaches that can be relieved by medicines approved by your health care provider.  You may urinate more often because the fetus is pressing on your bladder.  You may develop or continue to have heartburn as a result of your pregnancy.  You may develop constipation because certain hormones are causing the muscles that push waste through your intestines to slow down.  You may develop hemorrhoids or swollen, bulging veins (varicose veins).  You may have back pain because of the weight gain and pregnancy hormones relaxing your joints between the bones in your pelvis and as a result of a shift in weight and the muscles that support your balance.  Your breasts will continue to grow and be tender.  Your gums may bleed and may be sensitive to brushing and flossing.  Dark spots or blotches (chloasma, mask of pregnancy) may develop on your face. This will likely fade after the baby is born.  A dark line from your belly button to the pubic area (linea nigra) may appear. This will likely  fade after the baby is born.  You may have changes in your hair. These can include thickening of your hair, rapid growth, and changes in texture. Some women also have hair loss during or after pregnancy, or hair that feels dry or thin. Your hair will most likely return to normal after your baby is born. WHAT TO EXPECT AT YOUR PRENATAL VISITS During a routine prenatal visit:  You will be weighed to make sure you and the fetus are growing normally.  Your blood pressure will be taken.  Your abdomen will be measured to track your baby's growth.  The fetal heartbeat will be listened to.  Any test results from the previous visit will be discussed. Your health care provider may ask you:  How you are feeling.  If you are feeling the baby move.  If you have had any abnormal symptoms, such as leaking fluid, bleeding, severe headaches, or abdominal cramping.  If you are using any tobacco products, including cigarettes, chewing tobacco, and electronic cigarettes.  If you have any questions. Other tests that may be performed during your second trimester include:  Blood tests that check for:  Low iron levels (anemia).  Gestational diabetes (between 24 and 28 weeks).  Rh antibodies.  Urine tests to check for infections, diabetes, or protein in the urine.  An ultrasound to confirm the proper growth and development of the baby.  An amniocentesis to check for possible genetic problems.  Fetal screens for spina bifida   and Down syndrome.  HIV (human immunodeficiency virus) testing. Routine prenatal testing includes screening for HIV, unless you choose not to have this test. HOME CARE INSTRUCTIONS   Avoid all smoking, herbs, alcohol, and unprescribed drugs. These chemicals affect the formation and growth of the baby.  Do not use any tobacco products, including cigarettes, chewing tobacco, and electronic cigarettes. If you need help quitting, ask your health care provider. You may receive  counseling support and other resources to help you quit.  Follow your health care provider's instructions regarding medicine use. There are medicines that are either safe or unsafe to take during pregnancy.  Exercise only as directed by your health care provider. Experiencing uterine cramps is a good sign to stop exercising.  Continue to eat regular, healthy meals.  Wear a good support bra for breast tenderness.  Do not use hot tubs, steam rooms, or saunas.  Wear your seat belt at all times when driving.  Avoid raw meat, uncooked cheese, cat litter boxes, and soil used by cats. These carry germs that can cause birth defects in the baby.  Take your prenatal vitamins.  Take 1500-2000 mg of calcium daily starting at the 20th week of pregnancy until you deliver your baby.  Try taking a stool softener (if your health care provider approves) if you develop constipation. Eat more high-fiber foods, such as fresh vegetables or fruit and whole grains. Drink plenty of fluids to keep your urine clear or pale yellow.  Take warm sitz baths to soothe any pain or discomfort caused by hemorrhoids. Use hemorrhoid cream if your health care provider approves.  If you develop varicose veins, wear support hose. Elevate your feet for 15 minutes, 3-4 times a day. Limit salt in your diet.  Avoid heavy lifting, wear low heel shoes, and practice good posture.  Rest with your legs elevated if you have leg cramps or low back pain.  Visit your dentist if you have not gone yet during your pregnancy. Use a soft toothbrush to brush your teeth and be gentle when you floss.  A sexual relationship may be continued unless your health care provider directs you otherwise.  Continue to go to all your prenatal visits as directed by your health care provider. SEEK MEDICAL CARE IF:   You have dizziness.  You have mild pelvic cramps, pelvic pressure, or nagging pain in the abdominal area.  You have persistent nausea,  vomiting, or diarrhea.  You have a bad smelling vaginal discharge.  You have pain with urination. SEEK IMMEDIATE MEDICAL CARE IF:   You have a fever.  You are leaking fluid from your vagina.  You have spotting or bleeding from your vagina.  You have severe abdominal cramping or pain.  You have rapid weight gain or loss.  You have shortness of breath with chest pain.  You notice sudden or extreme swelling of your face, hands, ankles, feet, or legs.  You have not felt your baby move in over an hour.  You have severe headaches that do not go away with medicine.  You have vision changes.   This information is not intended to replace advice given to you by your health care provider. Make sure you discuss any questions you have with your health care provider.   Document Released: 12/30/2000 Document Revised: 01/26/2014 Document Reviewed: 03/08/2012 Elsevier Interactive Patient Education 2016 [employer]   Breastfeeding Deciding to breastfeed is one of the best choices you can make for you and your baby. A change   in hormones during pregnancy causes your breast tissue to grow and increases the number and size of your milk ducts. These hormones also allow proteins, sugars, and fats from your blood supply to make breast milk in your milk-producing glands. Hormones prevent breast milk from being released before your baby is born as well as prompt milk flow after birth. Once breastfeeding has begun, thoughts of your baby, as well as his or her sucking or crying, can stimulate the release of milk from your milk-producing glands.  BENEFITS OF BREASTFEEDING For Your Baby  Your first milk (colostrum) helps your baby's digestive system function better.  There are antibodies in your milk that help your baby fight off infections.  Your baby has a lower incidence of asthma, allergies, and sudden infant death syndrome.  The nutrients in breast milk are better for your baby than infant  formulas and are designed uniquely for your baby's needs.  Breast milk improves your baby's brain development.  Your baby is less likely to develop other conditions, such as childhood obesity, asthma, or type 2 diabetes mellitus. For You  Breastfeeding helps to create a very special bond between you and your baby.  Breastfeeding is convenient. Breast milk is always available at the correct temperature and costs nothing.  Breastfeeding helps to burn calories and helps you lose the weight gained during pregnancy.  Breastfeeding makes your uterus contract to its prepregnancy size faster and slows bleeding (lochia) after you give birth.   Breastfeeding helps to lower your risk of developing type 2 diabetes mellitus, osteoporosis, and breast or ovarian cancer later in life. SIGNS THAT YOUR BABY IS HUNGRY Early Signs of Hunger  Increased alertness or activity.  Stretching.  Movement of the head from side to side.  Movement of the head and opening of the mouth when the corner of the mouth or cheek is stroked (rooting).  Increased sucking sounds, smacking lips, cooing, sighing, or squeaking.  Hand-to-mouth movements.  Increased sucking of fingers or hands. Late Signs of Hunger  Fussing.  Intermittent crying. Extreme Signs of Hunger Signs of extreme hunger will require calming and consoling before your baby will be able to breastfeed successfully. Do not wait for the following signs of extreme hunger to occur before you initiate breastfeeding:  Restlessness.  A loud, strong cry.  Screaming. BREASTFEEDING BASICS Breastfeeding Initiation  Find a comfortable place to sit or lie down, with your neck and back well supported.  Place a pillow or rolled up blanket under your baby to bring him or her to the level of your breast (if you are seated). Nursing pillows are specially designed to help support your arms and your baby while you breastfeed.  Make sure that your baby's  abdomen is facing your abdomen.  Gently massage your breast. With your fingertips, massage from your chest wall toward your nipple in a circular motion. This encourages milk flow. You may need to continue this action during the feeding if your milk flows slowly.  Support your breast with 4 fingers underneath and your thumb above your nipple. Make sure your fingers are well away from your nipple and your baby's mouth.  Stroke your baby's lips gently with your finger or nipple.  When your baby's mouth is open wide enough, quickly bring your baby to your breast, placing your entire nipple and as much of the colored area around your nipple (areola) as possible into your baby's mouth.  More areola should be visible above your baby's upper lip than   below the lower lip.  Your baby's tongue should be between his or her lower gum and your breast.  Ensure that your baby's mouth is correctly positioned around your nipple (latched). Your baby's lips should create a seal on your breast and be turned out (everted).  It is common for your baby to suck about 2-3 minutes in order to start the flow of breast milk. Latching Teaching your baby how to latch on to your breast properly is very important. An improper latch can cause nipple pain and decreased milk supply for you and poor weight gain in your baby. Also, if your baby is not latched onto your nipple properly, he or she may swallow some air during feeding. This can make your baby fussy. Burping your baby when you switch breasts during the feeding can help to get rid of the air. However, teaching your baby to latch on properly is still the best way to prevent fussiness from swallowing air while breastfeeding. Signs that your baby has successfully latched on to your nipple:  Silent tugging or silent sucking, without causing you pain.  Swallowing heard between every 3-4 sucks.  Muscle movement above and in front of his or her ears while sucking. Signs  that your baby has not successfully latched on to nipple:  Sucking sounds or smacking sounds from your baby while breastfeeding.  Nipple pain. If you think your baby has not latched on correctly, slip your finger into the corner of your baby's mouth to break the suction and place it between your baby's gums. Attempt breastfeeding initiation again. Signs of Successful Breastfeeding Signs from your baby:  A gradual decrease in the number of sucks or complete cessation of sucking.  Falling asleep.  Relaxation of his or her body.  Retention of a small amount of milk in his or her mouth.  Letting go of your breast by himself or herself. Signs from you:  Breasts that have increased in firmness, weight, and size 1-3 hours after feeding.  Breasts that are softer immediately after breastfeeding.  Increased milk volume, as well as a change in milk consistency and color by the fifth day of breastfeeding.  Nipples that are not sore, cracked, or bleeding. Signs That Your Baby is Getting Enough Milk  Wetting at least 3 diapers in a 24-hour period. The urine should be clear and pale yellow by age 5 days.  At least 3 stools in a 24-hour period by age 5 days. The stool should be soft and yellow.  At least 3 stools in a 24-hour period by age 7 days. The stool should be seedy and yellow.  No loss of weight greater than 10% of birth weight during the first 3 days of age.  Average weight gain of 4-7 ounces (113-198 g) per week after age 4 days.  Consistent daily weight gain by age 5 days, without weight loss after the age of 2 weeks. After a feeding, your baby may spit up a small amount. This is common. BREASTFEEDING FREQUENCY AND DURATION Frequent feeding will help you make more milk and can prevent sore nipples and breast engorgement. Breastfeed when you feel the need to reduce the fullness of your breasts or when your baby shows signs of hunger. This is called "breastfeeding on demand." Avoid  introducing a pacifier to your baby while you are working to establish breastfeeding (the first 4-6 weeks after your baby is born). After this time you may choose to use a pacifier. Research has shown that   pacifier use during the first year of a baby's life decreases the risk of sudden infant death syndrome (SIDS). Allow your baby to feed on each breast as long as he or she wants. Breastfeed until your baby is finished feeding. When your baby unlatches or falls asleep while feeding from the first breast, offer the second breast. Because newborns are often sleepy in the first few weeks of life, you may need to awaken your baby to get him or her to feed. Breastfeeding times will vary from baby to baby. However, the following rules can serve as a guide to help you ensure that your baby is properly fed:  Newborns (babies 4 weeks of age or younger) may breastfeed every 1-3 hours.  Newborns should not go longer than 3 hours during the day or 5 hours during the night without breastfeeding.  You should breastfeed your baby a minimum of 8 times in a 24-hour period until you begin to introduce solid foods to your baby at around 6 months of age. BREAST MILK PUMPING Pumping and storing breast milk allows you to ensure that your baby is exclusively fed your breast milk, even at times when you are unable to breastfeed. This is especially important if you are going back to work while you are still breastfeeding or when you are not able to be present during feedings. Your lactation consultant can give you guidelines on how long it is safe to store breast milk. A breast pump is a machine that allows you to pump milk from your breast into a sterile bottle. The pumped breast milk can then be stored in a refrigerator or freezer. Some breast pumps are operated by hand, while others use electricity. Ask your lactation consultant which type will work best for you. Breast pumps can be purchased, but some hospitals and  breastfeeding support groups lease breast pumps on a monthly basis. A lactation consultant can teach you how to hand express breast milk, if you prefer not to use a pump. CARING FOR YOUR BREASTS WHILE YOU BREASTFEED Nipples can become dry, cracked, and sore while breastfeeding. The following recommendations can help keep your breasts moisturized and healthy:  Avoid using soap on your nipples.  Wear a supportive bra. Although not required, special nursing bras and tank tops are designed to allow access to your breasts for breastfeeding without taking off your entire bra or top. Avoid wearing underwire-style bras or extremely tight bras.  Air dry your nipples for 3-4minutes after each feeding.  Use only cotton bra pads to absorb leaked breast milk. Leaking of breast milk between feedings is normal.  Use lanolin on your nipples after breastfeeding. Lanolin helps to maintain your skin's normal moisture barrier. If you use pure lanolin, you do not need to wash it off before feeding your baby again. Pure lanolin is not toxic to your baby. You may also hand express a few drops of breast milk and gently massage that milk into your nipples and allow the milk to air dry. In the first few weeks after giving birth, some women experience extremely full breasts (engorgement). Engorgement can make your breasts feel heavy, warm, and tender to the touch. Engorgement peaks within 3-5 days after you give birth. The following recommendations can help ease engorgement:  Completely empty your breasts while breastfeeding or pumping. You may want to start by applying warm, moist heat (in the shower or with warm water-soaked hand towels) just before feeding or pumping. This increases circulation and helps the milk   flow. If your baby does not completely empty your breasts while breastfeeding, pump any extra milk after he or she is finished.  Wear a snug bra (nursing or regular) or tank top for 1-2 days to signal your body  to slightly decrease milk production.  Apply ice packs to your breasts, unless this is too uncomfortable for you.  Make sure that your baby is latched on and positioned properly while breastfeeding. If engorgement persists after 48 hours of following these recommendations, contact your health care provider or a lactation consultant. OVERALL HEALTH CARE RECOMMENDATIONS WHILE BREASTFEEDING  Eat healthy foods. Alternate between meals and snacks, eating 3 of each per day. Because what you eat affects your breast milk, some of the foods may make your baby more irritable than usual. Avoid eating these foods if you are sure that they are negatively affecting your baby.  Drink milk, fruit juice, and water to satisfy your thirst (about 10 glasses a day).  Rest often, relax, and continue to take your prenatal vitamins to prevent fatigue, stress, and anemia.  Continue breast self-awareness checks.  Avoid chewing and smoking tobacco. Chemicals from cigarettes that pass into breast milk and exposure to secondhand smoke may harm your baby.  Avoid alcohol and drug use, including marijuana. Some medicines that may be harmful to your baby can pass through breast milk. It is important to ask your health care provider before taking any medicine, including all over-the-counter and prescription medicine as well as vitamin and herbal supplements. It is possible to become pregnant while breastfeeding. If birth control is desired, ask your health care provider about options that will be safe for your baby. SEEK MEDICAL CARE IF:  You feel like you want to stop breastfeeding or have become frustrated with breastfeeding.  You have painful breasts or nipples.  Your nipples are cracked or bleeding.  Your breasts are red, tender, or warm.  You have a swollen area on either breast.  You have a fever or chills.  You have nausea or vomiting.  You have drainage other than breast milk from your nipples.  Your  breasts do not become full before feedings by the fifth day after you give birth.  You feel sad and depressed.  Your baby is too sleepy to eat well.  Your baby is having trouble sleeping.   Your baby is wetting less than 3 diapers in a 24-hour period.  Your baby has less than 3 stools in a 24-hour period.  Your baby's skin or the white part of his or her eyes becomes yellow.   Your baby is not gaining weight by 5 days of age. SEEK IMMEDIATE MEDICAL CARE IF:  Your baby is overly tired (lethargic) and does not want to wake up and feed.  Your baby develops an unexplained fever.   This information is not intended to replace advice given to you by your health care provider. Make sure you discuss any questions you have with your health care provider.   Document Released: 01/05/2005 Document Revised: 09/26/2014 Document Reviewed: 06/29/2012 Elsevier Interactive Patient Education 2016 [employer]  

## 2015-03-11 NOTE — Progress Notes (Signed)
Subjective:  Kimberly [name] is a 25 y.o. G2P0010 at 48w0dbeing seen today for ongoing prenatal care.  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  Patient reports no complaints.  Contractions: Not present. Vag. Bleeding: None.  Movement: Present. Denies leaking of fluid.   The following portions of the patient's history were reviewed and updated as appropriate: allergies, current medications, past family history, past medical history, past social history, past surgical history and problem list. Problem list updated.  Objective:   Filed Vitals:   03/11/15 1014  BP: 114/55  Pulse: 118  Temp: 98.7 F (37.1 C)  Weight: 247 lb 9.6 oz (112.311 kg)    Fetal Status: Fetal Heart Rate (bpm): 160   Movement: Present     General:  Alert, oriented and cooperative. Patient is in no acute distress.  Skin: Skin is warm and dry. No rash noted.   Cardiovascular: Normal heart rate noted  Respiratory: Normal respiratory effort, no problems with respiration noted  Abdomen: Soft, gravid, appropriate for gestational age. Pain/Pressure: Present     Pelvic: Vag. Bleeding: None     Cervical exam deferred        Extremities: Normal range of motion.  Edema: None  Mental Status: Normal mood and affect. Normal behavior. Normal judgment and thought content.   Urinalysis: Urine Protein: Trace Urine Glucose: Negative  Assessment and Plan:  Pregnancy: G2P0010 at 159w0d1. Supervision of high risk pregnancy, antepartum, second  trimester Continue prenatal care.  - Prenat w/o A Vit-FeFum-FePo-FA (CONCEPT OB) 130-92.4-1 MG CAPS; Take 1 capsule by mouth daily.  Dispense: 30 capsule; Refill: 11  2. Pre-diabetes Reports BS 335 at home---rapidly came back to normal without interventions, but 1 hour was 127  3. Thyroid disease during pregnancy, second trimester 1 trimester TFT's  4. Rubella non-immune status, antepartum Will need MMR pp  5. Secondary hypertension, unspecified Doubtful, as most BP's ok--likely related to stress of ED visits  6. Gastroesophageal reflux disease, esophagitis presence not specified  - omeprazole (PRILOSEC) 40 MG capsule; Take 1 capsule (40 mg total) by mouth daily.  Dispense: 30 capsule; Refill: 6  General obstetric precautions including but not limited to vaginal bleeding, contractions, leaking of fluid and fetal movement were reviewed in detail with the patient. Please refer to After Visit Summary for other counseling recommendations.  Return in 4 weeks (on 04/08/2015).   TaDonnamae JudeMD

## 2015-03-11 NOTE — Progress Notes (Signed)
Urine: trace hgb, trace wbc Breastfeeding tip of the week reviewed

## 2015-03-12 ENCOUNTER — Encounter (HOSPITAL_COMMUNITY): Payer: Self-pay

## 2015-03-12 ENCOUNTER — Ambulatory Visit (HOSPITAL_COMMUNITY)
Admission: RE | Admit: 2015-03-12 | Discharge: 2015-03-12 | Disposition: A | Discharge status: Home / Self Care | Payer: Medicaid Other | Source: Ambulatory Visit | Attending: Obstetrics & Gynecology | Admitting: Obstetrics & Gynecology

## 2015-03-12 VITALS — BP 100/84 | HR 108 | Wt 247.0 lb

## 2015-03-12 DIAGNOSIS — O99212 Obesity complicating pregnancy, second trimester: Secondary | ICD-10-CM | POA: Diagnosis not present

## 2015-03-12 DIAGNOSIS — Z3A19 19 weeks gestation of pregnancy: Secondary | ICD-10-CM | POA: Insufficient documentation

## 2015-03-12 DIAGNOSIS — K219 Gastro-esophageal reflux disease without esophagitis: Secondary | ICD-10-CM

## 2015-03-12 DIAGNOSIS — R7303 Prediabetes: Secondary | ICD-10-CM

## 2015-03-12 DIAGNOSIS — Z3492 Encounter for supervision of normal pregnancy, unspecified, second trimester: Secondary | ICD-10-CM

## 2015-03-12 DIAGNOSIS — Z36 Encounter for antenatal screening of mother: Secondary | ICD-10-CM | POA: Diagnosis not present

## 2015-03-12 DIAGNOSIS — O10012 Pre-existing essential hypertension complicating pregnancy, second trimester: Secondary | ICD-10-CM | POA: Diagnosis not present

## 2015-03-26 MED FILL — OMEPRAZOLE DR 40 MG CAPSULE: 40 | 30 days supply | Qty: 30 | Fill #0

## 2015-03-26 MED FILL — SYNTHROID 150 MCG TABLET: 150 | 30 days supply | Qty: 30 | Fill #3

## 2015-04-04 ENCOUNTER — Ambulatory Visit (INDEPENDENT_AMBULATORY_CARE_PROVIDER_SITE_OTHER): Payer: Medicaid Other | Admitting: Family

## 2015-04-04 VITALS — BP 116/73 | HR 125 | Temp 99.4°F | Wt 248.5 lb

## 2015-04-04 DIAGNOSIS — E039 Hypothyroidism, unspecified: Secondary | ICD-10-CM

## 2015-04-04 DIAGNOSIS — I159 Secondary hypertension, unspecified: Secondary | ICD-10-CM

## 2015-04-04 DIAGNOSIS — F418 Other specified anxiety disorders: Secondary | ICD-10-CM

## 2015-04-04 DIAGNOSIS — O162 Unspecified maternal hypertension, second trimester: Secondary | ICD-10-CM

## 2015-04-04 DIAGNOSIS — O99282 Endocrine, nutritional and metabolic diseases complicating pregnancy, second trimester: Secondary | ICD-10-CM | POA: Diagnosis not present

## 2015-04-04 DIAGNOSIS — R8271 Bacteriuria: Secondary | ICD-10-CM

## 2015-04-04 DIAGNOSIS — O99342 Other mental disorders complicating pregnancy, second trimester: Secondary | ICD-10-CM

## 2015-04-04 DIAGNOSIS — O0992 Supervision of high risk pregnancy, unspecified, second trimester: Secondary | ICD-10-CM

## 2015-04-04 DIAGNOSIS — E079 Disorder of thyroid, unspecified: Secondary | ICD-10-CM

## 2015-04-04 LAB — POCT URINALYSIS DIP (DEVICE)
GLUCOSE, UA: NEGATIVE mg/dL
KETONES UR: NEGATIVE mg/dL
NITRITE: POSITIVE — AB
PROTEIN: 30 mg/dL — AB
UROBILINOGEN UA: 1 mg/dL (ref 0.0–1.0)
pH: 5.5 (ref 5.0–8.0)

## 2015-04-04 MED ORDER — NITROFURANTOIN MONOHYD MACRO 100 MG PO CAPS
100.0000 mg | ORAL_CAPSULE | Freq: Two times a day (BID) | ORAL | Status: DC
Start: 2015-04-04 — End: 2015-05-07

## 2015-04-04 MED ORDER — PROMETHAZINE HCL 12.5 MG RE SUPP: 12.5000 mg | Freq: Four times a day (QID) | RECTAL | Status: DC | PRN

## 2015-04-04 NOTE — Addendum Note (Signed)
Addended by: Phillip Heal, DEMETRICE A on: 04/04/2015 04:59 PM   Modules accepted: Orders

## 2015-04-04 NOTE — Progress Notes (Signed)
Subjective:  Kimberly [name] is a 25 y.o. G2P0010 at [redacted]w[redacted]d being seen today for ongoing prenatal care.  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  Patient reports increased vomiting in past two day.  Denies fever, +chills.  Lower pelvic pressure, no bleeding.  Denies UTI symtoms.  No report of flank pain.    Contractions: Not present. Vag. Bleeding: None.  Movement: Present. Denies leaking of fluid.   The following portions of the patient's history were reviewed and updated as appropriate: allergies, current medications, past family history, past medical history, past social history, past surgical history and problem list. Problem list updated.  Objective:   Filed Vitals:   04/04/15 1202  BP: 116/73  Pulse: 125  Temp: 99.4 F (37.4 C)  Weight: 248 lb 8 oz (112.719 kg)    Fetal Status: Fetal Heart Rate (bpm): 125 Fundal Height: 22 cm Movement: Present     General:  Alert, oriented and cooperative. Patient is in no acute distress.  Skin: Skin is warm and dry. No rash noted.   Cardiovascular: Normal heart rate noted  Respiratory: Normal respiratory effort, no problems with respiration noted  Abdomen: Soft, gravid, appropriate for gestational age. Pain/Pressure: Present     Pelvic: Vag. Bleeding: None     Cervical exam deferred        Extremities: Normal range of motion.  Edema: None  Mental Status: Normal mood and affect. Normal behavior. Normal judgment and thought content.   Urinalysis: Urine Protein: 1+ Urine  Glucose: Negative; Ketones negative; Nitrites present; small leuks  Assessment and Plan:  Pregnancy: G2P0010 at [redacted]w[redacted]d  1. Bacteria in urine - nitrofurantoin, macrocrystal-monohydrate, (MACROBID) 100 MG capsule; Take 1 capsule (100 mg total) by mouth 2 (two) times daily.  Dispense: 14 capsule; Refill: 1 - Culture, OB Urine  2. Supervision of high risk pregnancy, antepartum, second trimester - Continue monitoring  3. Thyroid disease during pregnancy, second trimester - Check TSH, T4, T3 next visit  Preterm labor symptoms and general obstetric precautions including but not limited to vaginal bleeding, contractions, leaking of fluid and fetal movement were reviewed in detail with the patient. Please refer to After Visit Summary for other counseling recommendations.  Return in about 3 weeks (around 04/25/2015).   Venia Carbon Michiel Cowboy, CNM

## 2015-04-05 LAB — CULTURE, OB URINE

## 2015-04-12 ENCOUNTER — Encounter (HOSPITAL_COMMUNITY): Payer: Self-pay

## 2015-04-12 ENCOUNTER — Ambulatory Visit (HOSPITAL_COMMUNITY)
Admission: RE | Admit: 2015-04-12 | Discharge: 2015-04-12 | Disposition: A | Discharge status: Home / Self Care | Payer: Medicaid Other | Source: Ambulatory Visit | Attending: Family | Admitting: Family

## 2015-04-12 VITALS — BP 123/81 | HR 119 | Wt 249.0 lb

## 2015-04-12 DIAGNOSIS — Z36 Encounter for antenatal screening of mother: Secondary | ICD-10-CM | POA: Diagnosis not present

## 2015-04-12 DIAGNOSIS — Z3A23 23 weeks gestation of pregnancy: Secondary | ICD-10-CM | POA: Diagnosis not present

## 2015-04-12 DIAGNOSIS — O99280 Endocrine, nutritional and metabolic diseases complicating pregnancy, unspecified trimester: Secondary | ICD-10-CM

## 2015-04-12 DIAGNOSIS — O99212 Obesity complicating pregnancy, second trimester: Secondary | ICD-10-CM | POA: Diagnosis not present

## 2015-04-12 DIAGNOSIS — E079 Disorder of thyroid, unspecified: Secondary | ICD-10-CM

## 2015-04-12 DIAGNOSIS — O10012 Pre-existing essential hypertension complicating pregnancy, second trimester: Secondary | ICD-10-CM | POA: Insufficient documentation

## 2015-04-12 DIAGNOSIS — R7303 Prediabetes: Secondary | ICD-10-CM

## 2015-04-16 ENCOUNTER — Telehealth: Payer: Self-pay | Admitting: Family Medicine

## 2015-04-16 NOTE — Telephone Encounter (Signed)
Mother called to say her daughter was in pain, and felt like the cyst she has had since she was younger was causing her pain. She gets short of breath, and when she did her [place] the tech told her it had grown bigger. She is concerned it could bust during delivery. Please call and speak to daughter about what to do.

## 2015-04-17 NOTE — Telephone Encounter (Signed)
Spoke with patient who stated an cyst was found on her U/S. She thinks this could be causing her pain. I explained to patient it may be pregnancy related or pain from the cyst. I advised patient to take some Tylenol to see if that will help. Instructed patient on warning signs to look for such severe pain she can come to the MAU to bring look at.

## 2015-04-25 ENCOUNTER — Ambulatory Visit (INDEPENDENT_AMBULATORY_CARE_PROVIDER_SITE_OTHER): Payer: Medicaid Other | Admitting: Family

## 2015-04-25 VITALS — BP 115/77 | HR 106 | Temp 99.1°F | Wt 247.7 lb

## 2015-04-25 DIAGNOSIS — O99212 Obesity complicating pregnancy, second trimester: Secondary | ICD-10-CM | POA: Diagnosis not present

## 2015-04-25 DIAGNOSIS — O99282 Endocrine, nutritional and metabolic diseases complicating pregnancy, second trimester: Secondary | ICD-10-CM

## 2015-04-25 DIAGNOSIS — O10912 Unspecified pre-existing hypertension complicating pregnancy, second trimester: Secondary | ICD-10-CM

## 2015-04-25 DIAGNOSIS — E039 Hypothyroidism, unspecified: Secondary | ICD-10-CM

## 2015-04-25 DIAGNOSIS — O99280 Endocrine, nutritional and metabolic diseases complicating pregnancy, unspecified trimester: Secondary | ICD-10-CM

## 2015-04-25 DIAGNOSIS — N83209 Unspecified ovarian cyst, unspecified side: Secondary | ICD-10-CM | POA: Insufficient documentation

## 2015-04-25 DIAGNOSIS — O3480 Maternal care for other abnormalities of pelvic organs, unspecified trimester: Principal | ICD-10-CM

## 2015-04-25 DIAGNOSIS — N83202 Unspecified ovarian cyst, left side: Secondary | ICD-10-CM

## 2015-04-25 DIAGNOSIS — O3482 Maternal care for other abnormalities of pelvic organs, second trimester: Secondary | ICD-10-CM

## 2015-04-25 DIAGNOSIS — O0993 Supervision of high risk pregnancy, unspecified, third trimester: Secondary | ICD-10-CM

## 2015-04-25 DIAGNOSIS — E079 Disorder of thyroid, unspecified: Secondary | ICD-10-CM

## 2015-04-25 LAB — POCT URINALYSIS DIP (DEVICE)
Glucose, UA: NEGATIVE mg/dL
Ketones, ur: 40 mg/dL — AB
NITRITE: NEGATIVE
PH: 6 (ref 5.0–8.0)
Protein, ur: 30 mg/dL — AB
Specific Gravity, Urine: 1.015 (ref 1.005–1.030)
Urobilinogen, UA: 2 mg/dL — ABNORMAL HIGH (ref 0.0–1.0)

## 2015-04-25 NOTE — Progress Notes (Signed)
Subjective:  Kimberly [name] is a 25 y.o. G2P0010 at 2w3dbeing seen today for ongoing prenatal care.  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  Patient reports backache. Endorses some LLQ pain associated with ovarian cyst. Resolves with sitting down and applying pressure with breathing. Contractions: Not present. Vag. Bleeding: None.  Movement: Present. Denies leaking of fluid.   The following portions of the patient's history were reviewed and updated as appropriate: allergies, current medications, past family history, past medical history, past social history, past surgical history and problem list. Problem list updated.  Objective:   Filed Vitals:   04/25/15 1127  BP: 115/77  Pulse: 106  Temp: 99.1 F (37.3 C)  Weight: 247 lb 11.2 oz (112.356 kg)    Fetal Status: Fetal Heart Rate (bpm): 156 Fundal Height: 26 cm Movement: Present     General:  Alert, oriented and cooperative. Patient is in no acute distress.  Skin: Skin is warm and dry. No rash noted.   Cardiovascular: Normal heart rate noted  Respiratory: Normal respiratory effort, no problems with respiration noted  Abdomen: Soft, gravid, appropriate for gestational age. Pain/Pressure: Present     Pelvic: Vag. Bleeding: None     Cervical exam deferred        Extremities: Normal range of motion.     Mental Status: Normal mood and affect. Normal behavior. Normal judgment and thought content.   Urinalysis:      Urine results not available at discharge.     Assessment and Plan:  Pregnancy: G2P0010 at 238w3d1. Ovarian cyst affecting pregnancy, antepartum Left ovarian cyst measuring 11.6 X 10 X 12.4 cm with no increased vascularity at 23 week U/S, has known about cyst since age [age] to visualize due to patient position at 19 week ultrasound. At 9 weeks cyst measured 8.5 x 11.9 x 13.3 cm. Scheduled for follow up U/S on May 5, will monitor until then. Discussed signs and symptoms of torsion and patient expresses understanding.  2. Supervision of high risk pregnancy, antepartum, third trimester Return to clinic in 3 weeks 1 hour screen at next visit  3. Thyroid disease during pregnancy, unspecified trimester  Will check TSH, T3, T4 at next visit.  4. Rubella non-immune status, antepartum  Will need MMR pp   5. Secondary hypertension, unspecified  Blood pressures normal in clinic, suspect due to stress given nature of ED visits  6. Gastroesophageal reflux disease, esophagitis presence not specified Continue omeprazole as needed Preterm labor symptoms and general obstetric precautions including but not limited to vaginal bleeding, contractions, leaking of fluid and fetal movement were reviewed in detail with the patient. Please refer to After Visit Summary for other counseling recommendations.  No Follow-up on file.   WaVenia Carbon Michiel CowboyCNM

## 2015-04-25 NOTE — Progress Notes (Signed)
Pt complaining of sharp pains. She thinks it may be related to her cyst.

## 2015-05-07 ENCOUNTER — Encounter (HOSPITAL_COMMUNITY): Payer: Self-pay | Admitting: *Deleted

## 2015-05-07 ENCOUNTER — Inpatient Hospital Stay (HOSPITAL_COMMUNITY)
Admission: AD | Admit: 2015-05-07 | Discharge: 2015-05-07 | Disposition: A | Discharge status: Home / Self Care | Payer: Medicaid Other | Source: Ambulatory Visit | Attending: Family Medicine | Admitting: Family Medicine

## 2015-05-07 DIAGNOSIS — K219 Gastro-esophageal reflux disease without esophagitis: Secondary | ICD-10-CM | POA: Insufficient documentation

## 2015-05-07 DIAGNOSIS — O26892 Other specified pregnancy related conditions, second trimester: Secondary | ICD-10-CM | POA: Insufficient documentation

## 2015-05-07 DIAGNOSIS — F419 Anxiety disorder, unspecified: Secondary | ICD-10-CM | POA: Diagnosis not present

## 2015-05-07 DIAGNOSIS — J45909 Unspecified asthma, uncomplicated: Secondary | ICD-10-CM | POA: Diagnosis not present

## 2015-05-07 DIAGNOSIS — Z885 Allergy status to narcotic agent status: Secondary | ICD-10-CM | POA: Insufficient documentation

## 2015-05-07 DIAGNOSIS — M419 Scoliosis, unspecified: Secondary | ICD-10-CM | POA: Diagnosis not present

## 2015-05-07 DIAGNOSIS — Z91040 Latex allergy status: Secondary | ICD-10-CM | POA: Insufficient documentation

## 2015-05-07 DIAGNOSIS — E039 Hypothyroidism, unspecified: Secondary | ICD-10-CM | POA: Diagnosis not present

## 2015-05-07 DIAGNOSIS — F329 Major depressive disorder, single episode, unspecified: Secondary | ICD-10-CM | POA: Diagnosis not present

## 2015-05-07 DIAGNOSIS — Z881 Allergy status to other antibiotic agents status: Secondary | ICD-10-CM | POA: Insufficient documentation

## 2015-05-07 DIAGNOSIS — Z888 Allergy status to other drugs, medicaments and biological substances status: Secondary | ICD-10-CM | POA: Diagnosis not present

## 2015-05-07 DIAGNOSIS — O2342 Unspecified infection of urinary tract in pregnancy, second trimester: Secondary | ICD-10-CM | POA: Diagnosis not present

## 2015-05-07 DIAGNOSIS — N898 Other specified noninflammatory disorders of vagina: Secondary | ICD-10-CM | POA: Diagnosis not present

## 2015-05-07 DIAGNOSIS — Z882 Allergy status to sulfonamides status: Secondary | ICD-10-CM | POA: Insufficient documentation

## 2015-05-07 DIAGNOSIS — Z9109 Other allergy status, other than to drugs and biological substances: Secondary | ICD-10-CM | POA: Diagnosis not present

## 2015-05-07 DIAGNOSIS — F209 Schizophrenia, unspecified: Secondary | ICD-10-CM | POA: Insufficient documentation

## 2015-05-07 DIAGNOSIS — Z880 Allergy status to penicillin: Secondary | ICD-10-CM | POA: Insufficient documentation

## 2015-05-07 DIAGNOSIS — Z3A27 27 weeks gestation of pregnancy: Secondary | ICD-10-CM | POA: Diagnosis not present

## 2015-05-07 LAB — CBC
HCT: 31.4 % — ABNORMAL LOW (ref 36.0–46.0)
Hemoglobin: 9.8 g/dL — ABNORMAL LOW (ref 12.0–15.0)
MCH: 25.5 pg — AB (ref 26.0–34.0)
MCHC: 31.2 g/dL (ref 30.0–36.0)
MCV: 81.6 fL (ref 78.0–100.0)
PLATELETS: 340 10*3/uL (ref 150–400)
RBC: 3.85 MIL/uL — AB (ref 3.87–5.11)
RDW: 14.9 % (ref 11.5–15.5)
WBC: 14.7 10*3/uL — AB (ref 4.0–10.5)

## 2015-05-07 LAB — URINALYSIS, ROUTINE W REFLEX MICROSCOPIC
GLUCOSE, UA: NEGATIVE mg/dL
Ketones, ur: 15 mg/dL — AB
Nitrite: NEGATIVE
Protein, ur: NEGATIVE mg/dL
pH: 5 (ref 5.0–8.0)

## 2015-05-07 LAB — URINE MICROSCOPIC-ADD ON

## 2015-05-07 LAB — WET PREP, GENITAL
CLUE CELLS WET PREP: NONE SEEN
Sperm: NONE SEEN
TRICH WET PREP: NONE SEEN
Yeast Wet Prep HPF POC: NONE SEEN

## 2015-05-07 LAB — POCT FERN TEST: POCT FERN TEST: NEGATIVE

## 2015-05-07 MED ORDER — CEPHALEXIN 500 MG PO CAPS: 500.0000 mg | ORAL_CAPSULE | Freq: Four times a day (QID) | ORAL | Status: DC

## 2015-05-07 NOTE — MAU Note (Addendum)
Past wk she has noted some leakage, this has been wet, clear, no substance or smell. Worse today.  Has been peeing a lot today, it is dark orange. Low back pain

## 2015-05-07 NOTE — Discharge Instructions (Signed)
Pregnancy and Urinary Tract Infection °A urinary tract infection (UTI) is a bacterial infection of the urinary tract. Infection of the urinary tract can include the ureters, kidneys (pyelonephritis), bladder (cystitis), and urethra (urethritis). All pregnant women should be screened for bacteria in the urinary tract. Identifying and treating a UTI will decrease the risk of preterm labor and developing more serious infections in both the mother and baby. °CAUSES °Bacteria germs cause almost all UTIs.  °RISK FACTORS °Many factors can increase your chances of getting a UTI during pregnancy. These include: °· Having a short urethra. °· Poor toilet and hygiene habits. °· Sexual intercourse. °· Blockage of urine along the urinary tract. °· Problems with the pelvic muscles or nerves. °· Diabetes. °· Obesity. °· Bladder problems after having several children. °· Previous history of UTI. °SIGNS AND SYMPTOMS  °· Pain, burning, or a stinging feeling when urinating. °· Suddenly feeling the need to urinate right away (urgency). °· Loss of bladder control (urinary incontinence). °· Frequent urination, more than is common with pregnancy. °· Lower abdominal or back discomfort. °· Cloudy urine. °· Blood in the urine (hematuria). °· Fever.  °When the kidneys are infected, the symptoms may be: °· Back pain. °· Flank pain on the right side more so than the left. °· Fever. °· Chills. °· Nausea. °· Vomiting. °DIAGNOSIS  °A urinary tract infection is usually diagnosed through urine tests. Additional tests and procedures are sometimes done. These may include: °· Ultrasound exam of the kidneys, ureters, bladder, and urethra. °· Looking in the bladder with a lighted tube (cystoscopy). °TREATMENT °Typically, UTIs can be treated with antibiotic medicines.  °HOME CARE INSTRUCTIONS  °· Only take over-the-counter or prescription medicines as directed by your health care provider. If you were prescribed antibiotics, take them as directed. Finish  them even if you start to feel better. °· Drink enough fluids to keep your urine clear or pale yellow. °· Do not have sexual intercourse until the infection is gone and your health care provider says it is okay. °· Make sure you are tested for UTIs throughout your pregnancy. These infections often come back.  °Preventing a UTI in the Future °· Practice good toilet habits. Always wipe from front to back. Use the tissue only once. °· Do not hold your urine. Empty your bladder as soon as possible when the urge comes. °· Do not douche or use deodorant sprays. °· Wash with soap and warm water around the genital area and the anus. °· Empty your bladder before and after sexual intercourse. °· Wear underwear with a cotton crotch. °· Avoid caffeine and carbonated drinks. They can irritate the bladder. °· Drink cranberry juice or take cranberry pills. This may decrease the risk of getting a UTI. °· Do not drink alcohol. °· Keep all your appointments and tests as scheduled.  °SEEK MEDICAL CARE IF:  °· Your symptoms get worse. °· You are still having fevers 2 or more days after treatment begins. °· You have a rash. °· You feel that you are having problems with medicines prescribed. °· You have abnormal vaginal discharge. °SEEK IMMEDIATE MEDICAL CARE IF:  °· You have back or flank pain. °· You have chills. °· You have blood in your urine. °· You have nausea and vomiting. °· You have contractions of your uterus. °· You have a gush of fluid from the vagina. °MAKE SURE YOU: °· Understand these instructions.   °· Will watch your condition.   °· Will get help right away if you are not doing   pain.  · You have chills.  · You have blood in your urine.  · You have nausea and vomiting.  · You have contractions of your uterus.  · You have a gush of fluid from the vagina.  MAKE SURE YOU:  · Understand these instructions.    · Will watch your condition.    · Will get help right away if you are not doing well or get worse.       This information is not intended to replace advice given to you by your health care provider. Make sure you discuss any questions you have with your health care provider.     Document Released: 05/02/2010 Document Revised: 10/26/2012 Document Reviewed: 08/04/2012  Elsevier Interactive Patient Education ©2016 [employer]

## 2015-05-07 NOTE — MAU Provider Note (Signed)
Chief Complaint:  Back Pain and Vaginal Discharge   First Provider Initiated Contact with Patient 05/07/15 1836     HPI: Kimberly [name] is a 25 y.o. G2P0010 at [redacted]w[redacted]d who presents to maternity admissions reporting leaking of fluid, urinary frequency, low back pain and dark urine x ~1 week.  Location: Low back Quality: dull ache Severity: mild-mod Duration: 1 week Context: w/ activity Timing: constant w/ intermittent worsening Modifying factors: Improves w/ rest Associated signs and symptoms: Neg for fever, chills, hematuria, abd tenderness.  Denies contractions, leakage of fluid or vaginal bleeding. Good fetal movement.   Pregnancy Course:   Past Medical History: Past Medical History  Diagnosis Date  . Gastric reflux   . Anxiety   . Depression   . Scoliosis   . 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 Complication of anesthesia     "takes alot to put me to sleep; woke up completely mid endoscopy" (11/16/2012)  . Asthma     "grew out of it" (11/16/2012)  . Iron deficiency anemia   . Migraines     "weekly" (11/16/2012)  . Schizophrenia ([place])     "moderate" (11/16/2012)  . Dysrhythmia     ST (11/16/2012)    Past obstetric history: OB History  Gravida Para Term Preterm AB SAB TAB Ectopic Multiple Living  2 0   1 1    0    # Outcome Date GA Lbr Len/2nd Weight Sex Delivery Anes PTL Lv  2 Current           1 SAB 07/2014 [redacted]w[redacted]d             Past Surgical History: Past Surgical History  Procedure Laterality Date  . Upper gi endoscopy  ~ 2006; ~2008     Family History: Family History  Problem Relation Age of Onset  . Thyroid disease Maternal Grandmother   . Diabetes Mother   . Hypertension Mother   . Vision loss Mother   . Hypertension Father   . Hyperlipidemia Father     Social History: Social History  Substance Use Topics  . Smoking status: Never Smoker   . Smokeless tobacco: Never Used  . Alcohol Use: No    Allergies:  Allergies   Allergen Reactions  . Sulfa Antibiotics Swelling and Other (See Comments)  . Abilify [Aripiprazole]     Lethargy, unable to function  . Amoxicillin-Pot Clavulanate Other (See Comments)    Stomach pains  . Cephalexin Other (See Comments)    Stomach pains   . Ferrous Sulfate Nausea And Vomiting  . Keflex [Cephalexin]     Rash--able to take w/ Benadryl  . Latex     Swelling, burning of skin .   Marland Kitchen Tape     Swelling, burning of skin .   Marland Kitchen Zicam Cold Remedy [Erysidoron #1] Nausea And Vomiting  . Vicodin [Hydrocodone-Acetaminophen] Hives and Rash    Meds:  Prescriptions prior to admission  Medication Sig Dispense Refill Last Dose  . calcium carbonate (TUMS - DOSED IN MG ELEMENTAL CALCIUM) 500 MG chewable tablet Chew 1 tablet by mouth daily.   05/06/2015 at Unknown time  . levothyroxine (SYNTHROID) 150 MCG tablet Take 1 tablet (150 mcg total) by mouth daily before breakfast. 90 tablet 3 05/07/2015 at Unknown time  . nitrofurantoin, macrocrystal-monohydrate, (MACROBID) 100 MG capsule Take 1 capsule (100 mg total) by mouth 2 (two) times daily. (Patient not taking: Reported on 04/25/2015) 14 capsule 1 Not Taking  . omeprazole (PRILOSEC)  40 MG capsule Take 1 capsule (40 mg total) by mouth daily. 30 capsule 6 05/05/2015  . Prenat w/o A Vit-FeFum-FePo-FA (CONCEPT OB) 130-92.4-1 MG CAPS Take 1 capsule by mouth daily. (Patient not taking: Reported on 04/25/2015) 30 capsule 11 Not Taking  . promethazine (PHENERGAN) 12.5 MG suppository Place 1 suppository (12.5 mg total) rectally every 6 (six) hours as needed for nausea or vomiting. (Patient not taking: Reported on 05/07/2015) 12 each 0 Taking  . promethazine (PHENERGAN) 25 MG tablet Take 1 tablet (25 mg total) by mouth every 6 (six) hours as needed for nausea or vomiting. 15 tablet 0 05/04/2015    I have reviewed patient's Past Medical Hx, Surgical Hx, Family Hx, Social Hx, medications and allergies.   ROS:  Review of Systems  Constitutional: Negative  for fever and chills.  Gastrointestinal: Negative for nausea, vomiting and abdominal pain.  Genitourinary: Positive for frequency. Negative for dysuria, hematuria, flank pain and vaginal bleeding.       Leaking small amount of clear fluid.  Musculoskeletal: Positive for back pain.    Physical Exam  Patient Vitals for the past 24 hrs:  BP Temp Temp src Pulse Resp Weight  05/07/15 1741 133/75 mmHg 98.4 F (36.9 C) Axillary 107 18 249 lb (112.946 kg)   Constitutional: Well-developed, well-nourished female in no acute distress.  Cardiovascular: normal rate Respiratory: normal effort GI: Abd soft, non-tender, gravid appropriate for gestational age. MS: Extremities nontender, no edema, normal ROM. Mild sacral TTP Neurologic: Alert and oriented x 4.  GU: Neg CVAT.  Pelvic: NEFG, small amount of physiologic discharge, Neg pooling, no blood, cervix clean.  Dilation: Closed Effacement (%): Thick Exam by:: Gaetana Michaelis, CNM  FHT:  Baseline 145 , moderate variability, accelerations present, no decelerations Contractions: NOne   Labs: Results for orders placed or performed during the hospital encounter of 05/07/15 (from the past 24 hour(s))  Urinalysis, Routine w reflex microscopic (not at [facility])     Status: Abnormal   Collection Time: 05/07/15  5:45 PM  Result Value Ref Range   Color, Urine YELLOW YELLOW   APPearance HAZY (A) CLEAR   Specific Gravity, Urine >1.030 (H) 1.005 - 1.030   pH 5.0 5.0 - 8.0   Glucose, UA NEGATIVE NEGATIVE mg/dL   Hgb urine dipstick TRACE (A) NEGATIVE   Bilirubin Urine SMALL (A) NEGATIVE   Ketones, ur 15 (A) NEGATIVE mg/dL   Protein, ur NEGATIVE NEGATIVE mg/dL   Nitrite NEGATIVE NEGATIVE   Leukocytes, UA SMALL (A) NEGATIVE  Urine microscopic-add on     Status: Abnormal   Collection Time: 05/07/15  5:45 PM  Result Value Ref Range   Squamous Epithelial / LPF 6-30 (A) NONE SEEN   WBC, UA 6-30 0 - 5 WBC/hpf   RBC / HPF 0-5 0 - 5 RBC/hpf   Bacteria, UA MANY  (A) NONE SEEN   Crystals CA OXALATE CRYSTALS (A) NEGATIVE   Urine-Other MUCOUS PRESENT   CBC     Status: Abnormal   Collection Time: 05/07/15  6:48 PM  Result Value Ref Range   WBC 14.7 (H) 4.0 - 10.5 K/uL   RBC 3.85 (L) 3.87 - 5.11 MIL/uL   Hemoglobin 9.8 (L) 12.0 - 15.0 g/dL   HCT 31.4 (L) 36.0 - 46.0 %   MCV 81.6 78.0 - 100.0 fL   MCH 25.5 (L) 26.0 - 34.0 pg   MCHC 31.2 30.0 - 36.0 g/dL   RDW 14.9 11.5 - 15.5 %   Platelets 340 150 -  400 K/uL  Wet prep, genital     Status: Abnormal   Collection Time: 05/07/15  6:48 PM  Result Value Ref Range   Yeast Wet Prep HPF POC NONE SEEN NONE SEEN   Trich, Wet Prep NONE SEEN NONE SEEN   Clue Cells Wet Prep HPF POC NONE SEEN NONE SEEN   WBC, Wet Prep HPF POC MODERATE (A) NONE SEEN   Sperm NONE SEEN   POCT fern test     Status: Normal   Collection Time: 05/07/15  7:19 PM  Result Value Ref Range   POCT Fern Test Negative = intact amniotic membranes     Imaging:  NA  MAU Course: SSE, Fern, Wet prep, UA, urine culture  MDM: - UTI w/out evidence of pyelo. - MS LBP - Perceived LOF likely from leaking urine or leukorrhea  Assessment: 1. UTI in pregnancy, antepartum, second trimester   2. Vaginal discharge during pregnancy in second trimester    Plan: Discharge home in stable condition.  Preterm labor precautions and fetal kick counts Urine culture Rx Keflex due to multiple ABX allergies and for pyelo coverage (just in case). Is able to take w/ Benadryl. Follow-up Information    Follow up with [facility] In 1 week.   Specialty:  Obstetrics and Gynecology   Why:  Routine prenatal visit   Contact information:   [place] [phone]      Follow up with [facility] [place].   Why:  As needed if symptoms worsen   Contact information:   [id number] [place] [phone]         Medication List    STOP taking these medications        nitrofurantoin (macrocrystal-monohydrate) 100 MG capsule  Commonly known as:  MACROBID      TAKE these medications        calcium carbonate 500 MG chewable tablet  Commonly known as:  TUMS - dosed in mg elemental calcium  Chew 1 tablet by mouth daily.     cephALEXin 500 MG capsule  Commonly known as:  KEFLEX  Take 1 capsule (500 mg total) by mouth 4 (four) times daily.     CONCEPT OB 130-92.4-1 MG Caps  Take 1 capsule by mouth daily.     levothyroxine 150 MCG tablet  Commonly known as:  SYNTHROID  Take 1 tablet (150 mcg total) by mouth daily before breakfast.     omeprazole 40 MG capsule  Commonly known as:  PRILOSEC  Take 1 capsule (40 mg total) by mouth daily.     promethazine 25 MG tablet  Commonly known as:  PHENERGAN  Take 1 tablet (25 mg total) by mouth every 6 (six) hours as needed for nausea or vomiting.        [place], CNM 05/07/2015 8:12 PM

## 2015-05-08 LAB — CULTURE, OB URINE: SPECIAL REQUESTS: NORMAL

## 2015-05-16 ENCOUNTER — Encounter: Payer: Self-pay | Admitting: Obstetrics & Gynecology

## 2015-05-24 ENCOUNTER — Other Ambulatory Visit (HOSPITAL_COMMUNITY): Payer: Self-pay | Admitting: Maternal and Fetal Medicine

## 2015-05-24 ENCOUNTER — Ambulatory Visit (HOSPITAL_COMMUNITY)
Admission: RE | Admit: 2015-05-24 | Discharge: 2015-05-24 | Disposition: A | Discharge status: Home / Self Care | Payer: Medicaid Other | Source: Ambulatory Visit | Attending: Obstetrics & Gynecology | Admitting: Obstetrics & Gynecology

## 2015-05-24 ENCOUNTER — Encounter (HOSPITAL_COMMUNITY): Payer: Self-pay

## 2015-05-24 DIAGNOSIS — R93429 Abnormal radiologic findings on diagnostic imaging of unspecified kidney: Secondary | ICD-10-CM

## 2015-05-24 DIAGNOSIS — O99280 Endocrine, nutritional and metabolic diseases complicating pregnancy, unspecified trimester: Secondary | ICD-10-CM

## 2015-05-24 DIAGNOSIS — E079 Disorder of thyroid, unspecified: Secondary | ICD-10-CM

## 2015-05-24 DIAGNOSIS — O99213 Obesity complicating pregnancy, third trimester: Secondary | ICD-10-CM

## 2015-05-24 DIAGNOSIS — O10919 Unspecified pre-existing hypertension complicating pregnancy, unspecified trimester: Secondary | ICD-10-CM

## 2015-05-24 DIAGNOSIS — Z3A29 29 weeks gestation of pregnancy: Secondary | ICD-10-CM | POA: Diagnosis not present

## 2015-05-24 DIAGNOSIS — O10013 Pre-existing essential hypertension complicating pregnancy, third trimester: Secondary | ICD-10-CM | POA: Insufficient documentation

## 2015-05-24 DIAGNOSIS — O99283 Endocrine, nutritional and metabolic diseases complicating pregnancy, third trimester: Secondary | ICD-10-CM | POA: Diagnosis not present

## 2015-05-30 ENCOUNTER — Ambulatory Visit (INDEPENDENT_AMBULATORY_CARE_PROVIDER_SITE_OTHER): Payer: Medicaid Other | Admitting: Family Medicine

## 2015-05-30 ENCOUNTER — Encounter: Payer: Self-pay | Admitting: Obstetrics & Gynecology

## 2015-05-30 VITALS — BP 130/94 | HR 109 | Wt 249.0 lb

## 2015-05-30 DIAGNOSIS — O99213 Obesity complicating pregnancy, third trimester: Secondary | ICD-10-CM | POA: Diagnosis not present

## 2015-05-30 DIAGNOSIS — O99283 Endocrine, nutritional and metabolic diseases complicating pregnancy, third trimester: Secondary | ICD-10-CM

## 2015-05-30 DIAGNOSIS — O10913 Unspecified pre-existing hypertension complicating pregnancy, third trimester: Secondary | ICD-10-CM | POA: Diagnosis not present

## 2015-05-30 DIAGNOSIS — E063 Autoimmune thyroiditis: Secondary | ICD-10-CM

## 2015-05-30 DIAGNOSIS — O99613 Diseases of the digestive system complicating pregnancy, third trimester: Secondary | ICD-10-CM | POA: Diagnosis not present

## 2015-05-30 DIAGNOSIS — I159 Secondary hypertension, unspecified: Secondary | ICD-10-CM

## 2015-05-30 DIAGNOSIS — E039 Hypothyroidism, unspecified: Secondary | ICD-10-CM

## 2015-05-30 DIAGNOSIS — O0993 Supervision of high risk pregnancy, unspecified, third trimester: Secondary | ICD-10-CM

## 2015-05-30 DIAGNOSIS — K219 Gastro-esophageal reflux disease without esophagitis: Secondary | ICD-10-CM | POA: Diagnosis not present

## 2015-05-30 DIAGNOSIS — Z23 Encounter for immunization: Secondary | ICD-10-CM

## 2015-05-30 LAB — CBC
HEMATOCRIT: 32.4 % — AB (ref 35.0–45.0)
Hemoglobin: 10.1 g/dL — ABNORMAL LOW (ref 11.7–15.5)
MCH: 24.4 pg — AB (ref 27.0–33.0)
MCHC: 31.2 g/dL — AB (ref 32.0–36.0)
MCV: 78.3 fL — AB (ref 80.0–100.0)
MPV: 9.3 fL (ref 7.5–12.5)
Platelets: 327 10*3/uL (ref 140–400)
RBC: 4.14 MIL/uL (ref 3.80–5.10)
RDW: 15.4 % — AB (ref 11.0–15.0)
WBC: 13.1 10*3/uL — ABNORMAL HIGH (ref 3.8–10.8)

## 2015-05-30 LAB — POCT URINALYSIS DIP (DEVICE)
Bilirubin Urine: NEGATIVE
GLUCOSE, UA: NEGATIVE mg/dL
Hgb urine dipstick: NEGATIVE
Ketones, ur: NEGATIVE mg/dL
NITRITE: NEGATIVE
Protein, ur: NEGATIVE mg/dL
SPECIFIC GRAVITY, URINE: 1.02 (ref 1.005–1.030)
UROBILINOGEN UA: 0.2 mg/dL (ref 0.0–1.0)
pH: 6 (ref 5.0–8.0)

## 2015-05-30 LAB — COMPREHENSIVE METABOLIC PANEL
ALBUMIN: 3.3 g/dL — AB (ref 3.6–5.1)
ALT: 7 U/L (ref 6–29)
AST: 9 U/L — AB (ref 10–30)
Alkaline Phosphatase: 99 U/L (ref 33–115)
BILIRUBIN TOTAL: 0.5 mg/dL (ref 0.2–1.2)
BUN: 6 mg/dL — AB (ref 7–25)
CO2: 21 mmol/L (ref 20–31)
CREATININE: 0.37 mg/dL — AB (ref 0.50–1.10)
Calcium: 8.7 mg/dL (ref 8.6–10.2)
Chloride: 105 mmol/L (ref 98–110)
Glucose, Bld: 126 mg/dL — ABNORMAL HIGH (ref 65–99)
Potassium: 4.1 mmol/L (ref 3.5–5.3)
SODIUM: 139 mmol/L (ref 135–146)
TOTAL PROTEIN: 6 g/dL — AB (ref 6.1–8.1)

## 2015-05-30 LAB — TSH: TSH: 4.52 mIU/L — ABNORMAL HIGH

## 2015-05-30 LAB — T4, FREE: FREE T4: 1 ng/dL (ref 0.8–1.8)

## 2015-05-30 MED ORDER — TETANUS-DIPHTH-ACELL PERTUSSIS 5-2.5-18.5 LF-MCG/0.5 IM SUSP
0.5000 mL | Freq: Once | INTRAMUSCULAR | Status: AC
  Administered 2015-05-30: 0.5 mL via INTRAMUSCULAR

## 2015-05-30 NOTE — Progress Notes (Signed)
Subjective:  Kimberly [name] is a 25 y.o. G2P0010 at [redacted]w[redacted]d being seen today for ongoing prenatal care.  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  Patient reports no complaints.  Contractions: Not present. Vag. Bleeding: None.  Movement: Present. Denies leaking of fluid.   The following portions of the patient's history were reviewed and updated as appropriate: allergies, current medications, past family history, past medical history, past social history, past surgical history and problem list. Problem list updated.  Objective:   Filed Vitals:   05/30/15 1043  BP: 130/94  Pulse: 109  Weight: 249 lb (112.946 kg)    Fetal Status: Fetal Heart Rate (bpm): 150   Movement: Present     General:  Alert, oriented and cooperative. Patient is in no acute distress.  Skin: Skin is warm and dry. No rash noted.   Cardiovascular: Normal heart rate noted  Respiratory: Normal respiratory effort, no problems with respiration noted  Abdomen: Soft, gravid, appropriate for gestational age. Pain/Pressure: Present     Pelvic: Vag. Bleeding: None     Cervical exam deferred        Extremities: Normal range of motion.  Edema: Trace  Mental Status: Normal mood and affect. Normal behavior. Normal judgment and thought content.   Urinalysis: Urine Protein: Negative Urine Glucose: Negative  Assessment and Plan:  Pregnancy: G2P0010 at [redacted]w[redacted]d  1. Hypothyroid in pregnancy, antepartum, third  trimester Check TSH - TSH - T4, free  2. Supervision of high risk pregnancy, antepartum, third trimester 28 week labs.  FHT and FH normal  - CBC - HIV antibody (with reflex) - RPR - Glucose Tolerance, 1 HR (50g) w/o Fasting - Tdap (BOOSTRIX) injection 0.5 mL; Inject 0.5 mLs into the muscle once.  3. Secondary hypertension, unspecified Will check Pre-eclampsia labs. - Comprehensive metabolic panel - Protein / Creatinine Ratio, Urine  Preterm labor symptoms and general obstetric precautions including but not limited to vaginal bleeding, contractions, leaking of fluid and fetal movement were reviewed in detail with the patient. Please refer to After Visit Summary for other counseling recommendations.  Return for HR OB f/u.   Truett Mainland, DO

## 2015-05-30 NOTE — Progress Notes (Signed)
Reviewed tip of week with patient  Iron Rx?

## 2015-05-31 LAB — PROTEIN / CREATININE RATIO, URINE
CREATININE, URINE: 105 mg/dL (ref 20–320)
PROTEIN CREATININE RATIO: 143 mg/g{creat} (ref 21–161)
TOTAL PROTEIN, URINE: 15 mg/dL (ref 5–24)

## 2015-05-31 LAB — HIV ANTIBODY (ROUTINE TESTING W REFLEX): HIV 1&2 Ab, 4th Generation: NONREACTIVE

## 2015-05-31 LAB — RPR

## 2015-05-31 LAB — GLUCOSE TOLERANCE, 1 HOUR (50G) W/O FASTING: Glucose, 1 Hr, gestational: 127 mg/dL (ref <140)

## 2015-05-31 MED ORDER — LEVOTHYROXINE SODIUM 175 MCG PO TABS: 175.0000 ug | ORAL_TABLET | Freq: Every day | ORAL | Status: DC

## 2015-05-31 NOTE — Progress Notes (Addendum)
TSH 4.52.  T4 1.0.  Will Increase synthroid to 167mcg.

## 2015-05-31 NOTE — Addendum Note (Signed)
Addended by: Truett Mainland on: 05/31/2015 08:17 AM   Modules accepted: Orders

## 2015-06-03 ENCOUNTER — Telehealth: Payer: Self-pay

## 2015-06-03 NOTE — Telephone Encounter (Signed)
PT has been informed of test results and to start thyroid medication 136mcg

## 2015-06-03 NOTE — Telephone Encounter (Signed)
I have left a message for patient to call us back regarding test results. Synthroid increased to 133mcg. Please let pt know.

## 2015-06-11 ENCOUNTER — Encounter (HOSPITAL_COMMUNITY): Payer: Self-pay | Admitting: *Deleted

## 2015-06-11 ENCOUNTER — Inpatient Hospital Stay (HOSPITAL_COMMUNITY)
Admission: AD | Admit: 2015-06-11 | Discharge: 2015-06-11 | Disposition: A | Discharge status: Home / Self Care | Payer: Medicaid Other | Source: Ambulatory Visit | Attending: Family Medicine | Admitting: Family Medicine

## 2015-06-11 DIAGNOSIS — F209 Schizophrenia, unspecified: Secondary | ICD-10-CM | POA: Insufficient documentation

## 2015-06-11 DIAGNOSIS — T7431XA Adult psychological abuse, confirmed, initial encounter: Secondary | ICD-10-CM

## 2015-06-11 DIAGNOSIS — R110 Nausea: Secondary | ICD-10-CM | POA: Diagnosis not present

## 2015-06-11 DIAGNOSIS — K219 Gastro-esophageal reflux disease without esophagitis: Secondary | ICD-10-CM | POA: Insufficient documentation

## 2015-06-11 DIAGNOSIS — O99613 Diseases of the digestive system complicating pregnancy, third trimester: Secondary | ICD-10-CM | POA: Diagnosis not present

## 2015-06-11 DIAGNOSIS — O99513 Diseases of the respiratory system complicating pregnancy, third trimester: Secondary | ICD-10-CM | POA: Diagnosis not present

## 2015-06-11 DIAGNOSIS — O2343 Unspecified infection of urinary tract in pregnancy, third trimester: Secondary | ICD-10-CM | POA: Insufficient documentation

## 2015-06-11 DIAGNOSIS — Z2839 Other underimmunization status: Secondary | ICD-10-CM

## 2015-06-11 DIAGNOSIS — J069 Acute upper respiratory infection, unspecified: Secondary | ICD-10-CM | POA: Diagnosis not present

## 2015-06-11 DIAGNOSIS — O99343 Other mental disorders complicating pregnancy, third trimester: Secondary | ICD-10-CM | POA: Insufficient documentation

## 2015-06-11 DIAGNOSIS — O0993 Supervision of high risk pregnancy, unspecified, third trimester: Secondary | ICD-10-CM

## 2015-06-11 DIAGNOSIS — O212 Late vomiting of pregnancy: Secondary | ICD-10-CM | POA: Diagnosis present

## 2015-06-11 DIAGNOSIS — E079 Disorder of thyroid, unspecified: Secondary | ICD-10-CM

## 2015-06-11 DIAGNOSIS — Z3A32 32 weeks gestation of pregnancy: Secondary | ICD-10-CM | POA: Diagnosis not present

## 2015-06-11 DIAGNOSIS — Z283 Underimmunization status: Secondary | ICD-10-CM

## 2015-06-11 DIAGNOSIS — O99283 Endocrine, nutritional and metabolic diseases complicating pregnancy, third trimester: Secondary | ICD-10-CM

## 2015-06-11 DIAGNOSIS — O9989 Other specified diseases and conditions complicating pregnancy, childbirth and the puerperium: Secondary | ICD-10-CM

## 2015-06-11 DIAGNOSIS — N3000 Acute cystitis without hematuria: Secondary | ICD-10-CM

## 2015-06-11 LAB — URINE MICROSCOPIC-ADD ON: RBC / HPF: NONE SEEN RBC/hpf (ref 0–5)

## 2015-06-11 LAB — CBC WITH DIFFERENTIAL/PLATELET
Basophils Absolute: 0 10*3/uL (ref 0.0–0.1)
Basophils Relative: 0 %
EOS ABS: 0.1 10*3/uL (ref 0.0–0.7)
Eosinophils Relative: 1 %
HEMATOCRIT: 30.9 % — AB (ref 36.0–46.0)
HEMOGLOBIN: 9.2 g/dL — AB (ref 12.0–15.0)
LYMPHS ABS: 2.8 10*3/uL (ref 0.7–4.0)
LYMPHS PCT: 18 %
MCH: 23.7 pg — AB (ref 26.0–34.0)
MCHC: 29.8 g/dL — ABNORMAL LOW (ref 30.0–36.0)
MCV: 79.4 fL (ref 78.0–100.0)
MONOS PCT: 3 %
Monocytes Absolute: 0.5 10*3/uL (ref 0.1–1.0)
NEUTROS ABS: 11.9 10*3/uL — AB (ref 1.7–7.7)
NEUTROS PCT: 78 %
Platelets: 339 10*3/uL (ref 150–400)
RBC: 3.89 MIL/uL (ref 3.87–5.11)
RDW: 16.2 % — ABNORMAL HIGH (ref 11.5–15.5)
WBC: 15.3 10*3/uL — ABNORMAL HIGH (ref 4.0–10.5)

## 2015-06-11 LAB — URINALYSIS, ROUTINE W REFLEX MICROSCOPIC
Glucose, UA: NEGATIVE mg/dL
Ketones, ur: 15 mg/dL — AB
NITRITE: POSITIVE — AB
PH: 5.5 (ref 5.0–8.0)
Protein, ur: 30 mg/dL — AB
SPECIFIC GRAVITY, URINE: 1.025 (ref 1.005–1.030)

## 2015-06-11 MED ORDER — NITROFURANTOIN MONOHYD MACRO 100 MG PO CAPS: 100.0000 mg | ORAL_CAPSULE | Freq: Two times a day (BID) | ORAL | Status: DC

## 2015-06-11 MED ORDER — ONDANSETRON HCL 4 MG/2ML IJ SOLN
4.0000 mg | Freq: Once | INTRAMUSCULAR | Status: AC
  Administered 2015-06-11: 4 mg via INTRAVENOUS
  Filled 2015-06-11: qty 2

## 2015-06-11 MED ORDER — NITROFURANTOIN MONOHYD MACRO 100 MG PO CAPS
100.0000 mg | ORAL_CAPSULE | Freq: Once | ORAL | Status: AC
  Administered 2015-06-11: 100 mg via ORAL
  Filled 2015-06-11: qty 1

## 2015-06-11 MED ORDER — DM-GUAIFENESIN ER 30-600 MG PO TB12: 1.0000 | ORAL_TABLET | Freq: Two times a day (BID) | ORAL | Status: DC

## 2015-06-11 MED ORDER — MECLIZINE HCL 25 MG PO TABS: 25.0000 mg | ORAL_TABLET | Freq: Three times a day (TID) | ORAL | Status: DC | PRN

## 2015-06-11 MED ORDER — LACTATED RINGERS IV BOLUS (SEPSIS)
1000.0000 mL | Freq: Once | INTRAVENOUS | Status: AC
  Administered 2015-06-11: 1000 mL via INTRAVENOUS

## 2015-06-11 MED ORDER — AZITHROMYCIN 250 MG PO TABS: ORAL_TABLET | ORAL | Status: DC

## 2015-06-11 NOTE — MAU Note (Signed)
Emotionally upset; had to take out papers on baby daddy, he is schizophrenic, not sleeping, not taking meds.  She is very upset with this, she had been on Prozac- weaned herself off during the preg.

## 2015-06-11 NOTE — MAU Note (Addendum)
Been sick for about a week, just not getting better.  Has been throwing up a lot, can't keep anything down,  feeling weak.   Thinks fever, was really hot.
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
## 2015-06-11 NOTE — Discharge Instructions (Signed)
[facility]- [facility] You will be contacted if you need to change antibiotics for your UTI If your cough persists, start your Zithromax- 2 tablets 1st day then one daily
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
## 2015-06-12 LAB — CULTURE, OB URINE: Special Requests: NORMAL

## 2015-06-13 ENCOUNTER — Encounter (HOSPITAL_COMMUNITY): Payer: Self-pay

## 2015-06-13 ENCOUNTER — Inpatient Hospital Stay (HOSPITAL_COMMUNITY)
Admission: AD | Admit: 2015-06-13 | Discharge: 2015-06-13 | Disposition: A | Discharge status: Home / Self Care | Payer: Medicaid Other | Source: Ambulatory Visit | Attending: Obstetrics & Gynecology | Admitting: Obstetrics & Gynecology

## 2015-06-13 DIAGNOSIS — Z885 Allergy status to narcotic agent status: Secondary | ICD-10-CM | POA: Diagnosis not present

## 2015-06-13 DIAGNOSIS — N3000 Acute cystitis without hematuria: Secondary | ICD-10-CM | POA: Diagnosis not present

## 2015-06-13 DIAGNOSIS — O2313 Infections of bladder in pregnancy, third trimester: Secondary | ICD-10-CM | POA: Diagnosis not present

## 2015-06-13 DIAGNOSIS — E039 Hypothyroidism, unspecified: Secondary | ICD-10-CM | POA: Insufficient documentation

## 2015-06-13 DIAGNOSIS — O99343 Other mental disorders complicating pregnancy, third trimester: Secondary | ICD-10-CM | POA: Diagnosis not present

## 2015-06-13 DIAGNOSIS — O2343 Unspecified infection of urinary tract in pregnancy, third trimester: Secondary | ICD-10-CM

## 2015-06-13 DIAGNOSIS — Z880 Allergy status to penicillin: Secondary | ICD-10-CM | POA: Insufficient documentation

## 2015-06-13 DIAGNOSIS — N3001 Acute cystitis with hematuria: Secondary | ICD-10-CM

## 2015-06-13 DIAGNOSIS — Z3A32 32 weeks gestation of pregnancy: Secondary | ICD-10-CM | POA: Diagnosis not present

## 2015-06-13 DIAGNOSIS — O99283 Endocrine, nutritional and metabolic diseases complicating pregnancy, third trimester: Secondary | ICD-10-CM | POA: Diagnosis not present

## 2015-06-13 DIAGNOSIS — F418 Other specified anxiety disorders: Secondary | ICD-10-CM | POA: Insufficient documentation

## 2015-06-13 DIAGNOSIS — O4693 Antepartum hemorrhage, unspecified, third trimester: Secondary | ICD-10-CM | POA: Diagnosis present

## 2015-06-13 LAB — URINE MICROSCOPIC-ADD ON: RBC / HPF: NONE SEEN RBC/hpf (ref 0–5)

## 2015-06-13 LAB — URINALYSIS, ROUTINE W REFLEX MICROSCOPIC
GLUCOSE, UA: 100 mg/dL — AB
Ketones, ur: 15 mg/dL — AB
Nitrite: POSITIVE — AB
PH: 5.5 (ref 5.0–8.0)
Protein, ur: 30 mg/dL — AB
Specific Gravity, Urine: 1.02 (ref 1.005–1.030)

## 2015-06-13 NOTE — MAU Note (Signed)
Pt presents to MAU with complaints of vaginal bleeding when she voids. PT was here 2 days ago and diagnosed with a bladder infection. Reports pelvic pressure

## 2015-06-13 NOTE — Discharge Instructions (Signed)

## 2015-06-13 NOTE — MAU Provider Note (Signed)
History     CSN: [id number] Arrival date and time: 06/13/15 1708 First Provider Initiated Contact with Patient 06/13/15 1939      Chief Complaint  Patient presents with  . Vaginal Bleeding   HPI Patient is 25 y.o. G2P0010 [redacted]w[redacted]d here with complaints of bleeding. She reports it looked like it was in her urine. She was at the laundry mat and went to the bathroom and noted the blood in her urine. She reports increased frequency but no dysuria. Denies flank pain, fevers, chills, nausea, vomiting.   +FM, denies LOF, VB, contractions, vaginal discharge.   OB History    Gravida Para Term Preterm AB TAB SAB Ectopic Multiple Living   2 0   1  1   0      Past Medical History  Diagnosis Date  . Gastric reflux   . Anxiety   . Depression   . Scoliosis   . 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 Complication of anesthesia     "takes alot to put me to sleep; woke up completely mid endoscopy" (11/16/2012)  . Asthma     "grew out of it" (11/16/2012)  . Iron deficiency anemia   . Migraines     "weekly" (11/16/2012)  . Schizophrenia ([place])     "moderate" (11/16/2012)  . Dysrhythmia     ST (11/16/2012)    Past Surgical History  Procedure Laterality Date  . Upper gi endoscopy  ~ 2006; ~2008    Family History  Problem Relation Age of Onset  . Thyroid disease Maternal Grandmother   . Diabetes Mother   . Hypertension Mother   . Vision loss Mother   . Hypertension Father   . Hyperlipidemia Father     Social History  Substance Use Topics  . Smoking status: Never Smoker   . Smokeless tobacco: Never Used  . Alcohol Use: No    Allergies:  Allergies  Allergen Reactions  . Sulfa Antibiotics Swelling  . Abilify [Aripiprazole]     Lethargy, unable to function  . Amoxicillin-Pot Clavulanate Other (See Comments)    Stomach pains Has patient had a PCN reaction causing immediate rash, facial/tongue/throat swelling, SOB or lightheadedness with hypotension: No Has  patient had a PCN reaction causing severe rash involving mucus membranes or skin necrosis: No Has patient had a PCN reaction that required hospitalization No Has patient had a PCN reaction occurring within the last 10 years: Yes If all of the above answers are "NO", then may proceed with Cephalosporin use.   . Cephalexin Other (See Comments)    Stomach pains   . Ferrous Sulfate Nausea And Vomiting  . Keflex [Cephalexin]     Rash--able to take w/ Benadryl  . Latex     Swelling, burning of skin .   Marland Kitchen Tape     Swelling, burning of skin .   Marland Kitchen Zicam Cold Remedy [Erysidoron #1] Nausea And Vomiting  . Vicodin [Hydrocodone-Acetaminophen] Hives and Rash    Prescriptions prior to admission  Medication Sig Dispense Refill Last Dose  . calcium carbonate (TUMS - DOSED IN MG ELEMENTAL CALCIUM) 500 MG chewable tablet Chew 2 tablets by mouth daily.    Past Week at Unknown time  . levothyroxine (SYNTHROID, LEVOTHROID) 175 MCG tablet Take 1 tablet (175 mcg total) by mouth daily before breakfast. 30 tablet 3 06/12/2015 at Unknown time  . nitrofurantoin, macrocrystal-monohydrate, (MACROBID) 100 MG capsule Take 1 capsule (100 mg total) by mouth 2 (two) times daily. 10  capsule 0 06/13/2015 at Unknown time  . Prenat w/o A Vit-FeFum-FePo-FA (CONCEPT OB) 130-92.4-1 MG CAPS Take 1 capsule by mouth daily. 30 capsule 11 06/12/2015 at Unknown time  . promethazine (PHENERGAN) 25 MG tablet Take 1 tablet (25 mg total) by mouth every 6 (six) hours as needed for nausea or vomiting. 15 tablet 0 Past Month at Unknown time  . azithromycin (ZITHROMAX Z-PAK) 250 MG tablet Take 2 tablets on 1st day then one tablet daily (Patient not taking: Reported on 06/13/2015) 6 each 0   . dextromethorphan-guaiFENesin (MUCINEX DM) 30-600 MG 12hr tablet Take 1 tablet by mouth 2 (two) times daily. (Patient not taking: Reported on 06/13/2015) 20 tablet 0   . meclizine (ANTIVERT) 25 MG tablet Take 1 tablet (25 mg total) by mouth 3 (three) times  daily as needed for dizziness. (Patient not taking: Reported on 06/13/2015) 30 tablet 0 Not Taking at Unknown time    Review of Systems  Constitutional: Negative for fever and chills.  Eyes: Negative for blurred vision and double vision.  Respiratory: Negative for cough and shortness of breath.   Cardiovascular: Negative for chest pain and orthopnea.  Gastrointestinal: Negative for nausea and vomiting.  Genitourinary: Positive for frequency and hematuria. Negative for dysuria, urgency and flank pain.  Musculoskeletal: Negative for myalgias.  Skin: Negative for rash.  Neurological: Negative for dizziness, tingling, weakness and headaches.  Endo/Heme/Allergies: Does not bruise/bleed easily.  Psychiatric/Behavioral: Negative for depression and suicidal ideas. The patient is not nervous/anxious.    Physical Exam   Blood pressure 138/74, pulse 118, resp. rate 18, last menstrual period 10/29/2014, SpO2 100 %.  Physical Exam  Nursing note and vitals reviewed. Constitutional: She is oriented to person, place, and time. She appears well-developed and well-nourished. No distress.  Pregnant female. Obese   HENT:  Head: Normocephalic and atraumatic.  Eyes: Conjunctivae are normal. No scleral icterus.  Neck: Normal range of motion. Neck supple.  Cardiovascular: Normal rate and intact distal pulses.   Respiratory: Effort normal. She exhibits no tenderness.  GI: Soft. There is no tenderness. There is no rebound and no guarding.  Gravid  Genitourinary: Vagina normal. No vaginal discharge found.  No blood in vaginal vault. Visually closed.  Musculoskeletal: Normal range of motion. She exhibits no edema.  Neurological: She is alert and oriented to person, place, and time.  Skin: Skin is warm and dry. No rash noted.  Psychiatric: She has a normal mood and affect.    MAU Course  Procedures  MDM NST 150/mod/+accels, no decels Toco quiet  Results for orders placed or performed during the  hospital encounter of 06/13/15 (from the past 24 hour(s))  Urinalysis, Routine w reflex microscopic (not at [facility])     Status: Abnormal   Collection Time: 06/13/15  5:25 PM  Result Value Ref Range   Color, Urine AMBER (A) YELLOW   APPearance CLEAR CLEAR   Specific Gravity, Urine 1.020 1.005 - 1.030   pH 5.5 5.0 - 8.0   Glucose, UA 100 (A) NEGATIVE mg/dL   Hgb urine dipstick TRACE (A) NEGATIVE   Bilirubin Urine LARGE (A) NEGATIVE   Ketones, ur 15 (A) NEGATIVE mg/dL   Protein, ur 30 (A) NEGATIVE mg/dL   Nitrite POSITIVE (A) NEGATIVE   Leukocytes, UA MODERATE (A) NEGATIVE  Urine microscopic-add on     Status: Abnormal   Collection Time: 06/13/15  5:25 PM  Result Value Ref Range   Squamous Epithelial / LPF 0-5 (A) NONE SEEN   WBC, UA  6-30 0 - 5 WBC/hpf   RBC / HPF NONE SEEN 0 - 5 RBC/hpf   Bacteria, UA MANY (A) NONE SEEN   Urine-Other MICROSCOPIC EXAM PERFORMED ON UNCONCENTRATED URINE    Lab Results  Component Value Date   CULT * 06/11/2015    MULTIPLE SPECIES PRESENT, SUGGEST RECOLLECTION NO GROUP B STREP (S.AGALACTIAE) ISOLATED Performed at [facility]      Assessment and Plan   #Acute cystitis with hematuria - UA consistent with UTI but recent cx was negative/mutli species - Continue macrobid, given that there is no specific species and the patient has allergies to sulfa, pcn, and keflex.  - Return precautions reviewed   Caren Macadam 06/13/2015, 7:40 PM

## 2015-06-15 LAB — CULTURE, OB URINE: Special Requests: NORMAL

## 2015-06-20 ENCOUNTER — Encounter: Payer: Medicaid Other | Admitting: Obstetrics and Gynecology

## 2015-07-02 ENCOUNTER — Inpatient Hospital Stay (HOSPITAL_COMMUNITY)
Admission: AD | Admit: 2015-07-02 | Discharge: 2015-07-02 | Disposition: A | Discharge status: Home / Self Care | Payer: Medicaid Other | Source: Ambulatory Visit | Attending: Obstetrics & Gynecology | Admitting: Obstetrics & Gynecology

## 2015-07-02 ENCOUNTER — Encounter (HOSPITAL_COMMUNITY): Payer: Self-pay | Admitting: *Deleted

## 2015-07-02 DIAGNOSIS — N898 Other specified noninflammatory disorders of vagina: Secondary | ICD-10-CM

## 2015-07-02 DIAGNOSIS — O4700 False labor before 37 completed weeks of gestation, unspecified trimester: Secondary | ICD-10-CM | POA: Diagnosis not present

## 2015-07-02 DIAGNOSIS — J45909 Unspecified asthma, uncomplicated: Secondary | ICD-10-CM | POA: Insufficient documentation

## 2015-07-02 DIAGNOSIS — O4703 False labor before 37 completed weeks of gestation, third trimester: Secondary | ICD-10-CM

## 2015-07-02 DIAGNOSIS — Z882 Allergy status to sulfonamides status: Secondary | ICD-10-CM | POA: Insufficient documentation

## 2015-07-02 DIAGNOSIS — K219 Gastro-esophageal reflux disease without esophagitis: Secondary | ICD-10-CM | POA: Insufficient documentation

## 2015-07-02 DIAGNOSIS — F209 Schizophrenia, unspecified: Secondary | ICD-10-CM | POA: Diagnosis not present

## 2015-07-02 DIAGNOSIS — Z885 Allergy status to narcotic agent status: Secondary | ICD-10-CM | POA: Diagnosis not present

## 2015-07-02 DIAGNOSIS — Z3A35 35 weeks gestation of pregnancy: Secondary | ICD-10-CM | POA: Diagnosis not present

## 2015-07-02 DIAGNOSIS — Z833 Family history of diabetes mellitus: Secondary | ICD-10-CM | POA: Diagnosis not present

## 2015-07-02 DIAGNOSIS — Z8249 Family history of ischemic heart disease and other diseases of the circulatory system: Secondary | ICD-10-CM | POA: Insufficient documentation

## 2015-07-02 DIAGNOSIS — Z881 Allergy status to other antibiotic agents status: Secondary | ICD-10-CM | POA: Diagnosis not present

## 2015-07-02 DIAGNOSIS — G43909 Migraine, unspecified, not intractable, without status migrainosus: Secondary | ICD-10-CM | POA: Diagnosis not present

## 2015-07-02 DIAGNOSIS — Z91048 Other nonmedicinal substance allergy status: Secondary | ICD-10-CM | POA: Insufficient documentation

## 2015-07-02 DIAGNOSIS — O42913 Preterm premature rupture of membranes, unspecified as to length of time between rupture and onset of labor, third trimester: Secondary | ICD-10-CM | POA: Diagnosis not present

## 2015-07-02 DIAGNOSIS — D509 Iron deficiency anemia, unspecified: Secondary | ICD-10-CM | POA: Insufficient documentation

## 2015-07-02 DIAGNOSIS — O26893 Other specified pregnancy related conditions, third trimester: Secondary | ICD-10-CM

## 2015-07-02 DIAGNOSIS — E039 Hypothyroidism, unspecified: Secondary | ICD-10-CM | POA: Diagnosis not present

## 2015-07-02 DIAGNOSIS — Z91040 Latex allergy status: Secondary | ICD-10-CM | POA: Insufficient documentation

## 2015-07-02 DIAGNOSIS — M419 Scoliosis, unspecified: Secondary | ICD-10-CM | POA: Insufficient documentation

## 2015-07-02 DIAGNOSIS — Z880 Allergy status to penicillin: Secondary | ICD-10-CM | POA: Diagnosis not present

## 2015-07-02 DIAGNOSIS — Z8342 Family history of familial hypercholesterolemia: Secondary | ICD-10-CM | POA: Diagnosis not present

## 2015-07-02 DIAGNOSIS — F418 Other specified anxiety disorders: Secondary | ICD-10-CM | POA: Insufficient documentation

## 2015-07-02 LAB — AMNISURE RUPTURE OF MEMBRANE (ROM) NOT AT ARMC: Amnisure ROM: NEGATIVE

## 2015-07-02 MED ORDER — LACTATED RINGERS IV BOLUS (SEPSIS)
1000.0000 mL | Freq: Once | INTRAVENOUS | Status: AC
  Administered 2015-07-02: 1000 mL via INTRAVENOUS

## 2015-07-02 NOTE — Discharge Instructions (Signed)

## 2015-07-02 NOTE — MAU Note (Signed)
PT SAYS SHE THINKS  SROM AT 8PM-   WHILE  SHE WAS SITTING ON BED-  FELT NEED TO GO TO B-ROOM-    PANTS   SOAKING WET. .       NO PAD ON    .  NO UC'S.      PT WAS IN MAU-   HAD VE CLOSED.       DENIES HSV AND MRSA.  GBS- UNSURE.

## 2015-07-02 NOTE — MAU Note (Signed)
Notified provider that patient is here for PROM. Provider said she would be down to see patient.

## 2015-07-02 NOTE — MAU Provider Note (Signed)
Chief Complaint:  Rupture of Membranes   None     HPI: Kimberly [name] is a 25 y.o. G2P0010 at 36w1dwho presents to maternity admissions reporting leakage of clear fluid x 1 episode at home, enough to wet her underwear and make shorts damp but not enough to require a pad.  She reports frequent urination since last night. She denies abdominal pain or contractions. She reports good fetal movement, denies vaginal bleeding, vaginal itching/burning, urinary symptoms, h/a, dizziness, n/v, or fever/chills.    HPI  Past Medical History: Past Medical History  Diagnosis Date  . Gastric reflux   . Anxiety   . Depression   . Scoliosis   . 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 Complication of anesthesia     "takes alot to put me to sleep; woke up completely mid endoscopy" (11/16/2012)  . Asthma     "grew out of it" (11/16/2012)  . Iron deficiency anemia   . Migraines     "weekly" (11/16/2012)  . Schizophrenia ([place])     "moderate" (11/16/2012)  . Dysrhythmia     ST (11/16/2012)    Past obstetric history: OB History  Gravida Para Term Preterm AB SAB TAB Ectopic Multiple Living  2 0   1 1    0    # Outcome Date GA Lbr Len/2nd Weight Sex Delivery Anes PTL Lv  2 Current           1 SAB 07/2014 [redacted]w[redacted]d             Past Surgical History: Past Surgical History  Procedure Laterality Date  . Upper gi endoscopy  ~ 2006; ~2008    Family History: Family History  Problem Relation Age of Onset  . Thyroid disease Maternal Grandmother   . Diabetes Mother   . Hypertension Mother   . Vision loss Mother   . Hypertension Father   . Hyperlipidemia Father     Social History: Social History  Substance Use Topics  . Smoking status: Never Smoker   . Smokeless tobacco: Never Used  . Alcohol Use: No    Allergies:  Allergies  Allergen Reactions  . Sulfa Antibiotics Swelling  . Abilify [Aripiprazole]     Lethargy, unable to function  . Amoxicillin-Pot Clavulanate Other  (See Comments)    Stomach pains Has patient had a PCN reaction causing immediate rash, facial/tongue/throat swelling, SOB or lightheadedness with hypotension: No Has patient had a PCN reaction causing severe rash involving mucus membranes or skin necrosis: No Has patient had a PCN reaction that required hospitalization No Has patient had a PCN reaction occurring within the last 10 years: Yes If all of the above answers are "NO", then may proceed with Cephalosporin use.   . Cephalexin Other (See Comments)    Stomach pains   . Ferrous Sulfate Nausea And Vomiting  . Keflex [Cephalexin]     Rash--able to take w/ Benadryl  . Latex     Swelling, burning of skin .   Marland Kitchen Tape     Swelling, burning of skin .   Marland Kitchen Zicam Cold Remedy [Erysidoron #1] Nausea And Vomiting  . Vicodin [Hydrocodone-Acetaminophen] Hives and Rash    Meds:  Prescriptions prior to admission  Medication Sig Dispense Refill Last Dose  . calcium carbonate (TUMS - DOSED IN MG ELEMENTAL CALCIUM) 500 MG chewable tablet Chew 2 tablets by mouth daily.    Past Week at Unknown time  . dextromethorphan-guaiFENesin (MUCINEX DM) 30-600 MG 12hr tablet  Take 1 tablet by mouth 2 (two) times daily. (Patient not taking: Reported on 06/13/2015) 20 tablet 0   . levothyroxine (SYNTHROID, LEVOTHROID) 175 MCG tablet Take 1 tablet (175 mcg total) by mouth daily before breakfast. 30 tablet 3 06/12/2015 at Unknown time  . meclizine (ANTIVERT) 25 MG tablet Take 1 tablet (25 mg total) by mouth 3 (three) times daily as needed for dizziness. (Patient not taking: Reported on 06/13/2015) 30 tablet 0 Not Taking at Unknown time  . nitrofurantoin, macrocrystal-monohydrate, (MACROBID) 100 MG capsule Take 1 capsule (100 mg total) by mouth 2 (two) times daily. 10 capsule 0 06/13/2015 at Unknown time  . Prenat w/o A Vit-FeFum-FePo-FA (CONCEPT OB) 130-92.4-1 MG CAPS Take 1 capsule by mouth daily. 30 capsule 11 06/12/2015 at Unknown time  . promethazine (PHENERGAN) 25 MG  tablet Take 1 tablet (25 mg total) by mouth every 6 (six) hours as needed for nausea or vomiting. 15 tablet 0 Past Month at Unknown time    ROS:  Review of Systems  Constitutional: Negative for fever, chills and fatigue.  Eyes: Negative for visual disturbance.  Respiratory: Negative for shortness of breath.   Cardiovascular: Negative for chest pain.  Gastrointestinal: Negative for nausea, vomiting and abdominal pain.  Genitourinary: Positive for vaginal discharge. Negative for dysuria, flank pain, vaginal bleeding, difficulty urinating, vaginal pain and pelvic pain.  Neurological: Negative for dizziness and headaches.  Psychiatric/Behavioral: Negative.      I have reviewed patient's Past Medical Hx, Surgical Hx, Family Hx, Social Hx, medications and allergies.   Physical Exam  Patient Vitals for the past 24 hrs:  BP Temp Temp src Pulse Height Weight  07/02/15 2255 (!) 124/50 mmHg - - (!) 125 - -  07/02/15 2044 122/77 mmHg 98.3 F (36.8 C) Oral (!) 128 5\' 5"  (1.651 m) 113.853 kg (251 lb)   Constitutional: Well-developed, well-nourished female in no acute distress.  Cardiovascular: normal rate Respiratory: normal effort GI: Abd soft, non-tender, gravid appropriate for gestational age.  MS: Extremities nontender, no edema, normal ROM Neurologic: Alert and oriented x 4.  GU: Neg CVAT.  PELVIC EXAM: Cervix pink, visually closed, without lesion, scant white creamy discharge, Negative pooling of fluid, vaginal walls and external genitalia normal  Ferning slide taken during pelvic exam negative  Dilation: 1 (No fluid seen) Effacement (%): Thick Station:  (high) Exam by:: Feliberto Harts, RN BSN  FHT:  Baseline 135 , moderate variability, accelerations present, no decelerations Contractions: None on toco or to palpation   Labs: Results for orders placed or performed during the hospital encounter of 07/02/15 (from the past 24 hour(s))  Amnisure rupture of membrane (rom)not at  [facility]     Status: None   Collection Time: 07/02/15  9:40 PM  Result Value Ref Range   Amnisure ROM NEGATIVE    A/POS/-- (01/16 1006)  Imaging:  No results found.  MAU Course/MDM: Initially called by RN who reported no visible fluid on perineum but slide taken and cervical exam performed and ferns seen on slide.  No slide available for review by provider.  Amnisure ordered and was negative.  SSE with negative pooling and negative ferning on slide taken.  Consult Dr Roselie Awkward.  No evidence of ROM today.  Unsure the cause of ferning on first slide by RN. Possibly gel or cervical mucus.  Preterm labor precautions discussed at length with pt/reasons to return to hospital.  Pt received LR x 1000 ml by IV in MAU. IV discontinued prior to discharge.  Pt  stable at time of discharge.  Assessment: 1. Threatened preterm labor, third trimester   2. Vaginal discharge during pregnancy, third trimester     Plan: Discharge home Labor precautions and fetal kick counts  Follow-up Information    Follow up with [facility].   Specialty:  Obstetrics and Gynecology   Why:  As scheduled, Return to MAU as needed for emergencies   Contact information:   [address] [phone]       Medication List    TAKE these medications        calcium carbonate 500 MG chewable tablet  Commonly known as:  TUMS - dosed in mg elemental calcium  Chew 2 tablets by mouth daily.     CONCEPT OB 130-92.4-1 MG Caps  Take 1 capsule by mouth daily.     dextromethorphan-guaiFENesin 30-600 MG 12hr tablet  Commonly known as:  MUCINEX DM  Take 1 tablet by mouth 2 (two) times daily.     levothyroxine 175 MCG tablet  Commonly known as:  SYNTHROID  Take 1 tablet (175 mcg total) by mouth daily before breakfast.     meclizine 25 MG tablet  Commonly known as:  ANTIVERT  Take 1 tablet (25 mg total) by mouth 3 (three) times daily as needed for dizziness.     nitrofurantoin  (macrocrystal-monohydrate) 100 MG capsule  Commonly known as:  MACROBID  Take 1 capsule (100 mg total) by mouth 2 (two) times daily.     promethazine 25 MG tablet  Commonly known as:  PHENERGAN  Take 1 tablet (25 mg total) by mouth every 6 (six) hours as needed for nausea or vomiting.        Fatima Blank Certified Nurse-Midwife 07/02/2015 10:58 PM

## 2015-07-05 ENCOUNTER — Ambulatory Visit (HOSPITAL_COMMUNITY)
Admission: RE | Admit: 2015-07-05 | Discharge: 2015-07-05 | Disposition: A | Discharge status: Home / Self Care | Payer: Medicaid Other | Source: Ambulatory Visit | Attending: Obstetrics and Gynecology | Admitting: Obstetrics and Gynecology

## 2015-07-05 ENCOUNTER — Encounter (HOSPITAL_COMMUNITY): Payer: Self-pay

## 2015-07-05 VITALS — BP 124/91 | HR 115 | Wt 248.5 lb

## 2015-07-05 DIAGNOSIS — Z283 Underimmunization status: Secondary | ICD-10-CM

## 2015-07-05 DIAGNOSIS — O99283 Endocrine, nutritional and metabolic diseases complicating pregnancy, third trimester: Secondary | ICD-10-CM | POA: Insufficient documentation

## 2015-07-05 DIAGNOSIS — O99213 Obesity complicating pregnancy, third trimester: Secondary | ICD-10-CM | POA: Diagnosis not present

## 2015-07-05 DIAGNOSIS — O10013 Pre-existing essential hypertension complicating pregnancy, third trimester: Secondary | ICD-10-CM | POA: Diagnosis not present

## 2015-07-05 DIAGNOSIS — O99280 Endocrine, nutritional and metabolic diseases complicating pregnancy, unspecified trimester: Secondary | ICD-10-CM

## 2015-07-05 DIAGNOSIS — O0993 Supervision of high risk pregnancy, unspecified, third trimester: Secondary | ICD-10-CM

## 2015-07-05 DIAGNOSIS — O9989 Other specified diseases and conditions complicating pregnancy, childbirth and the puerperium: Secondary | ICD-10-CM

## 2015-07-05 DIAGNOSIS — Z3A35 35 weeks gestation of pregnancy: Secondary | ICD-10-CM | POA: Diagnosis not present

## 2015-07-05 DIAGNOSIS — Z2839 Other underimmunization status: Secondary | ICD-10-CM

## 2015-07-05 DIAGNOSIS — E079 Disorder of thyroid, unspecified: Secondary | ICD-10-CM

## 2015-07-24 ENCOUNTER — Other Ambulatory Visit (HOSPITAL_COMMUNITY)
Admission: RE | Admit: 2015-07-24 | Discharge: 2015-07-24 | Disposition: A | Discharge status: Home / Self Care | Payer: Medicaid Other | Source: Ambulatory Visit | Attending: Obstetrics and Gynecology | Admitting: Obstetrics and Gynecology

## 2015-07-24 ENCOUNTER — Ambulatory Visit (INDEPENDENT_AMBULATORY_CARE_PROVIDER_SITE_OTHER): Payer: Medicaid Other | Admitting: Family

## 2015-07-24 VITALS — BP 126/86 | HR 119 | Wt 251.4 lb

## 2015-07-24 DIAGNOSIS — E079 Disorder of thyroid, unspecified: Secondary | ICD-10-CM

## 2015-07-24 DIAGNOSIS — O99283 Endocrine, nutritional and metabolic diseases complicating pregnancy, third trimester: Secondary | ICD-10-CM | POA: Diagnosis not present

## 2015-07-24 DIAGNOSIS — O163 Unspecified maternal hypertension, third trimester: Secondary | ICD-10-CM

## 2015-07-24 DIAGNOSIS — Z113 Encounter for screening for infections with a predominantly sexual mode of transmission: Secondary | ICD-10-CM | POA: Diagnosis present

## 2015-07-24 DIAGNOSIS — R8271 Bacteriuria: Secondary | ICD-10-CM

## 2015-07-24 DIAGNOSIS — O3483 Maternal care for other abnormalities of pelvic organs, third trimester: Secondary | ICD-10-CM

## 2015-07-24 DIAGNOSIS — N83209 Unspecified ovarian cyst, unspecified side: Secondary | ICD-10-CM | POA: Diagnosis not present

## 2015-07-24 DIAGNOSIS — O3480 Maternal care for other abnormalities of pelvic organs, unspecified trimester: Secondary | ICD-10-CM

## 2015-07-24 DIAGNOSIS — O169 Unspecified maternal hypertension, unspecified trimester: Secondary | ICD-10-CM | POA: Insufficient documentation

## 2015-07-24 DIAGNOSIS — O0993 Supervision of high risk pregnancy, unspecified, third trimester: Secondary | ICD-10-CM

## 2015-07-24 DIAGNOSIS — E038 Other specified hypothyroidism: Secondary | ICD-10-CM

## 2015-07-24 LAB — CBC
HEMATOCRIT: 28.4 % — AB (ref 35.0–45.0)
Hemoglobin: 8.7 g/dL — ABNORMAL LOW (ref 11.7–15.5)
MCH: 22.4 pg — ABNORMAL LOW (ref 27.0–33.0)
MCHC: 30.6 g/dL — ABNORMAL LOW (ref 32.0–36.0)
MCV: 73.2 fL — AB (ref 80.0–100.0)
MPV: 9.8 fL (ref 7.5–12.5)
Platelets: 313 10*3/uL (ref 140–400)
RBC: 3.88 MIL/uL (ref 3.80–5.10)
RDW: 16.9 % — ABNORMAL HIGH (ref 11.0–15.0)
WBC: 11.3 10*3/uL — ABNORMAL HIGH (ref 3.8–10.8)

## 2015-07-24 LAB — COMPREHENSIVE METABOLIC PANEL
ALBUMIN: 3.1 g/dL — AB (ref 3.6–5.1)
ALT: 6 U/L (ref 6–29)
AST: 10 U/L (ref 10–30)
Alkaline Phosphatase: 150 U/L — ABNORMAL HIGH (ref 33–115)
BUN: 5 mg/dL — ABNORMAL LOW (ref 7–25)
CALCIUM: 8.6 mg/dL (ref 8.6–10.2)
CHLORIDE: 107 mmol/L (ref 98–110)
CO2: 20 mmol/L (ref 20–31)
CREATININE: 0.34 mg/dL — AB (ref 0.50–1.10)
Glucose, Bld: 69 mg/dL (ref 65–99)
Potassium: 4.4 mmol/L (ref 3.5–5.3)
SODIUM: 137 mmol/L (ref 135–146)
Total Bilirubin: 1.1 mg/dL (ref 0.2–1.2)
Total Protein: 5.7 g/dL — ABNORMAL LOW (ref 6.1–8.1)

## 2015-07-24 LAB — T4, FREE: FREE T4: 1.1 ng/dL (ref 0.8–1.8)

## 2015-07-24 LAB — POCT URINALYSIS DIP (DEVICE)
Glucose, UA: 100 mg/dL — AB
NITRITE: POSITIVE — AB
Protein, ur: 100 mg/dL — AB
Specific Gravity, Urine: 1.025 (ref 1.005–1.030)
UROBILINOGEN UA: 2 mg/dL — AB (ref 0.0–1.0)
pH: 5.5 (ref 5.0–8.0)

## 2015-07-24 LAB — OB RESULTS CONSOLE GBS: GBS: NEGATIVE

## 2015-07-24 LAB — OB RESULTS CONSOLE GC/CHLAMYDIA: GC PROBE AMP, GENITAL: NEGATIVE

## 2015-07-24 LAB — TSH: TSH: 10.51 mIU/L — ABNORMAL HIGH

## 2015-07-24 MED ORDER — NITROFURANTOIN MONOHYD MACRO 100 MG PO CAPS: 100.0000 mg | ORAL_CAPSULE | Freq: Two times a day (BID) | ORAL | Status: DC

## 2015-07-24 NOTE — Addendum Note (Signed)
Addended by: Shelly Coss on: 07/24/2015 10:39 AM   Modules accepted: Orders

## 2015-07-24 NOTE — Progress Notes (Signed)
Post consultation with Dr. Roselie Awkward: Reviewed Dr. Virginia Crews note from 05/30/15; ? Chronic hypertension, elevated blood pressures in 2013 and 2014.  Pt has not had prenatal care since 30 wks, reports did not have an appointment scheduled.  Blood pressure 130/94 on 05/30/15 and 124/91 on 6/16.  Dr. Roselie Awkward desires to obtain baseline labs today and bring patient in for BP check on Friday.  IOL decision will be made at that time.  NST reactive today.  Last growth ultrasound 35 wks 81%ile growth.

## 2015-07-24 NOTE — Progress Notes (Signed)
Pt has concerns about ovarian cyst found on recent US.  Breastfeeding tip of the week reviewed.

## 2015-07-24 NOTE — Addendum Note (Signed)
Addended by: Riccardo Dubin on: 07/24/2015 04:49 PM   Modules accepted: Orders

## 2015-07-24 NOTE — Addendum Note (Signed)
Addended by: Shelly Coss on: 07/24/2015 11:53 AM   Modules accepted: Orders

## 2015-07-24 NOTE — Progress Notes (Signed)
Subjective:  Kimberly [name] is a 25 y.o. G2P0010 at [redacted]w[redacted]d being seen today for ongoing prenatal care.  Patient reports no complaints. Denies  headache, vision changes, or epigastric pain.  Contractions: Irregular.  Vag. Bleeding: None. Movement: Present. Denies leaking of fluid.   The following portions of the patient's history were reviewed and updated as appropriate: allergies, current medications, past family history, past medical history, past social history, past surgical history and problem list.   Objective:   Filed Vitals:   07/24/15 0859  BP: 126/86  Pulse: 119  Weight: 251 lb 6.4 oz (114.034 kg)    Fetal Status: Fetal Heart Rate (bpm): 154 Fundal Height: 39 cm Movement: Present  Presentation: Vertex  General:  Alert, oriented and cooperative. Patient is in no acute distress.  Skin: Skin is warm and dry. No rash noted.   Cardiovascular: Normal heart rate noted  Respiratory: Normal respiratory effort, no problems with respiration noted  Abdomen: Soft, gravid, appropriate for gestational age. Pain/Pressure: Present     Vaginal: Vag. Bleeding: None.       Cervix: Exam revealed Dilation: Fingertip Effacement (%): Thick    Extremities: Normal range of motion.  Edema: Trace  Mental Status: Normal mood and affect. Normal behavior. Normal judgment and thought content.   Urinalysis: Urine Protein: 2+ Urine Glucose: 1+  Assessment and Plan:  Pregnancy: G2P0010 at [redacted]w[redacted]d  1. Other specified hypothyroidism - T4, free - TSH  2. Supervision of high risk pregnancy, antepartum, third trimester - Culture, beta strep (group b only) - GC/Chlamydia probe amp ([place])not at [facility]  3. Bacteria in urine - nitrofurantoin, macrocrystal-monohydrate, (MACROBID) 100 MG capsule; Take 1 capsule (100 mg total) by mouth 2 (two) times daily.  Dispense: 14 capsule; Refill: 1 - Culture, OB Urine  4.  Ovarian Cyst - Reviewed ultrasound results with patient > will reevaluate after delivery  5.   Hypertension affecting Pregnancy - NST w AFI today - IOL at 39 weeks Term labor symptoms and general obstetric precautions including but not limited to vaginal bleeding, contractions, leaking of fluid and fetal movement were reviewed in detail with the patient. Please refer to After Visit Summary for other counseling recommendations.  IOL on 07/29/15  Gwen Pounds, CNM

## 2015-07-24 NOTE — Addendum Note (Signed)
Addended by: Gwen Pounds on: 07/24/2015 10:22 AM   Modules accepted: Orders

## 2015-07-25 ENCOUNTER — Other Ambulatory Visit: Payer: Self-pay | Admitting: Family

## 2015-07-25 DIAGNOSIS — O99019 Anemia complicating pregnancy, unspecified trimester: Secondary | ICD-10-CM | POA: Insufficient documentation

## 2015-07-25 DIAGNOSIS — O99013 Anemia complicating pregnancy, third trimester: Secondary | ICD-10-CM

## 2015-07-25 LAB — GC/CHLAMYDIA PROBE AMP (~~LOC~~) NOT AT ARMC
Chlamydia: NEGATIVE
Neisseria Gonorrhea: NEGATIVE

## 2015-07-26 ENCOUNTER — Ambulatory Visit (INDEPENDENT_AMBULATORY_CARE_PROVIDER_SITE_OTHER): Payer: Medicaid Other | Admitting: Family Medicine

## 2015-07-26 VITALS — BP 119/86 | HR 138 | Wt 251.0 lb

## 2015-07-26 DIAGNOSIS — O0993 Supervision of high risk pregnancy, unspecified, third trimester: Secondary | ICD-10-CM

## 2015-07-26 DIAGNOSIS — Z36 Encounter for antenatal screening of mother: Secondary | ICD-10-CM

## 2015-07-26 DIAGNOSIS — O2343 Unspecified infection of urinary tract in pregnancy, third trimester: Secondary | ICD-10-CM

## 2015-07-26 DIAGNOSIS — E063 Autoimmune thyroiditis: Secondary | ICD-10-CM

## 2015-07-26 DIAGNOSIS — O163 Unspecified maternal hypertension, third trimester: Secondary | ICD-10-CM | POA: Diagnosis present

## 2015-07-26 DIAGNOSIS — E038 Other specified hypothyroidism: Secondary | ICD-10-CM

## 2015-07-26 DIAGNOSIS — O99283 Endocrine, nutritional and metabolic diseases complicating pregnancy, third trimester: Secondary | ICD-10-CM

## 2015-07-26 DIAGNOSIS — E079 Disorder of thyroid, unspecified: Secondary | ICD-10-CM

## 2015-07-26 LAB — POCT URINALYSIS DIP (DEVICE)
GLUCOSE, UA: NEGATIVE mg/dL
Hgb urine dipstick: NEGATIVE
KETONES UR: NEGATIVE mg/dL
NITRITE: POSITIVE — AB
PH: 5.5 (ref 5.0–8.0)
PROTEIN: 100 mg/dL — AB
Specific Gravity, Urine: >=1.03 (ref 1.005–1.030)
Urobilinogen, UA: 1 mg/dL (ref 0.0–1.0)

## 2015-07-26 LAB — CULTURE, BETA STREP (GROUP B ONLY)

## 2015-07-26 MED ORDER — CEFADROXIL 500 MG PO CAPS: 500.0000 mg | ORAL_CAPSULE | Freq: Two times a day (BID) | ORAL | Status: DC

## 2015-07-26 MED ORDER — LEVOTHYROXINE SODIUM 112 MCG PO TABS: 224.0000 ug | ORAL_TABLET | Freq: Every day | ORAL | Status: DC

## 2015-07-26 NOTE — Progress Notes (Signed)
Pt states she was not told that antibiotic for UTI had been sent to her pharmacy and therefore she is not taking.  She denies sx of UTI.

## 2015-07-26 NOTE — Progress Notes (Signed)
Subjective:  Kimberly [name] is a 25 y.o. G2P0010 at [redacted]w[redacted]d being seen today for ongoing prenatal care.  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  Patient reports no complaints.  Contractions: Irregular. Vag. Bleeding: None.  Movement: Present. Denies leaking of fluid.   The following portions of the patient's history were reviewed and updated as appropriate: allergies, current medications, past family history, past medical history, past social history, past surgical history and problem list. Problem list updated.  Objective:   Filed Vitals:   07/26/15 0903 07/26/15 0930  BP: 135/67 119/86  Pulse: 138   Weight: 251 lb (113.853 kg)     Fetal Status: Fetal Heart Rate (bpm): NST   Movement: Present  Presentation: Vertex  General:  Alert, oriented and cooperative. Patient is in no acute distress.  Skin: Skin is warm and dry. No rash noted.   Cardiovascular: Normal heart rate noted  Respiratory: Normal respiratory effort, no problems with respiration noted  Abdomen: Soft, gravid, appropriate for gestational age. Pain/Pressure: Present     Pelvic:  Cervical exam performed Dilation: Fingertip Effacement (%): Thick    Extremities: Normal range of motion.     Mental Status: Normal mood and affect. Normal behavior. Normal judgment and thought content.   Urinalysis: Urine Protein: 2+ Urine  Glucose: Negative  Assessment and Plan:  Pregnancy: G2P0010 at [redacted]w[redacted]d  1. Hypertension affecting pregnancy, antepartum, third trimester NST reactive.  Labs normals.  Discussed pt with Dr Lisbeth Renshaw, due to confusion about whether to consider this patient as chronic vs gestation HTN.  Will continue to consider her as chronic.  Deliver no later than 40 weeks, although recommended moving toward delivery if has subsequent elevated BP.  Cervix unfavorable at this point. - Amniotic fluid index with NST  2. Supervision of high risk pregnancy, antepartum, third trimester   3. Thyroid disease during pregnancy, third trimester Increase synthroid to 262mcg.   4. Hypothyroidism, acquired, autoimmune  - levothyroxine (SYNTHROID, LEVOTHROID) 112 MCG tablet; Take 2 tablets (224 mcg total) by mouth daily before breakfast.  Dispense: 60 tablet; Refill: 2  5. UTI (urinary tract infection) during pregnancy, third trimester Treat with cefadroxil.  Term labor symptoms and general obstetric precautions including but not limited to vaginal bleeding, contractions, leaking of fluid and fetal movement were reviewed in detail with the patient. Please refer to After Visit Summary for other counseling recommendations.  Return in about 4 days (around 07/30/2015) for NST only.   Truett Mainland, DO

## 2015-07-28 ENCOUNTER — Inpatient Hospital Stay (HOSPITAL_COMMUNITY)
Admission: AD | Admit: 2015-07-28 | Discharge: 2015-07-28 | Disposition: A | Discharge status: Home / Self Care | Payer: Medicaid Other | Source: Ambulatory Visit | Attending: Obstetrics & Gynecology | Admitting: Obstetrics & Gynecology

## 2015-07-28 ENCOUNTER — Encounter (HOSPITAL_COMMUNITY): Payer: Self-pay

## 2015-07-28 DIAGNOSIS — O99283 Endocrine, nutritional and metabolic diseases complicating pregnancy, third trimester: Secondary | ICD-10-CM

## 2015-07-28 DIAGNOSIS — O99013 Anemia complicating pregnancy, third trimester: Secondary | ICD-10-CM

## 2015-07-28 DIAGNOSIS — O9989 Other specified diseases and conditions complicating pregnancy, childbirth and the puerperium: Secondary | ICD-10-CM

## 2015-07-28 DIAGNOSIS — E079 Disorder of thyroid, unspecified: Secondary | ICD-10-CM

## 2015-07-28 DIAGNOSIS — Z3493 Encounter for supervision of normal pregnancy, unspecified, third trimester: Secondary | ICD-10-CM | POA: Insufficient documentation

## 2015-07-28 DIAGNOSIS — O163 Unspecified maternal hypertension, third trimester: Secondary | ICD-10-CM

## 2015-07-28 DIAGNOSIS — O0993 Supervision of high risk pregnancy, unspecified, third trimester: Secondary | ICD-10-CM

## 2015-07-28 DIAGNOSIS — Z2839 Other underimmunization status: Secondary | ICD-10-CM

## 2015-07-28 DIAGNOSIS — Z283 Underimmunization status: Secondary | ICD-10-CM

## 2015-07-28 LAB — URINALYSIS, ROUTINE W REFLEX MICROSCOPIC
Glucose, UA: 100 mg/dL — AB
HGB URINE DIPSTICK: NEGATIVE
Ketones, ur: 15 mg/dL — AB
Nitrite: NEGATIVE
PH: 5.5 (ref 5.0–8.0)
Protein, ur: 30 mg/dL — AB

## 2015-07-28 LAB — URINE MICROSCOPIC-ADD ON

## 2015-07-28 LAB — AMNISURE RUPTURE OF MEMBRANE (ROM) NOT AT ARMC: Amnisure ROM: NEGATIVE

## 2015-07-28 NOTE — MAU Note (Signed)
Patient presents with c/o possible rupture of membranes, went swimming in a pool, got out dried off bent over and felt like a water balloon released, no leaking since then.

## 2015-07-30 ENCOUNTER — Inpatient Hospital Stay (HOSPITAL_COMMUNITY)
Admission: AD | Admit: 2015-07-30 | Discharge: 2015-08-02 | DRG: 774 | Disposition: A | Discharge status: Home / Self Care | Payer: Medicaid Other | Source: Ambulatory Visit | Attending: Obstetrics & Gynecology | Admitting: Obstetrics & Gynecology

## 2015-07-30 ENCOUNTER — Encounter (HOSPITAL_COMMUNITY): Payer: Self-pay

## 2015-07-30 ENCOUNTER — Ambulatory Visit (INDEPENDENT_AMBULATORY_CARE_PROVIDER_SITE_OTHER): Payer: Medicaid Other | Admitting: Advanced Practice Midwife

## 2015-07-30 VITALS — BP 116/92 | HR 108

## 2015-07-30 DIAGNOSIS — O1002 Pre-existing essential hypertension complicating childbirth: Secondary | ICD-10-CM | POA: Diagnosis present

## 2015-07-30 DIAGNOSIS — E038 Other specified hypothyroidism: Secondary | ICD-10-CM | POA: Diagnosis present

## 2015-07-30 DIAGNOSIS — O99344 Other mental disorders complicating childbirth: Secondary | ICD-10-CM | POA: Diagnosis present

## 2015-07-30 DIAGNOSIS — Z3A39 39 weeks gestation of pregnancy: Secondary | ICD-10-CM | POA: Diagnosis not present

## 2015-07-30 DIAGNOSIS — M419 Scoliosis, unspecified: Secondary | ICD-10-CM | POA: Diagnosis present

## 2015-07-30 DIAGNOSIS — O1092 Unspecified pre-existing hypertension complicating childbirth: Secondary | ICD-10-CM | POA: Diagnosis not present

## 2015-07-30 DIAGNOSIS — F418 Other specified anxiety disorders: Secondary | ICD-10-CM | POA: Diagnosis present

## 2015-07-30 DIAGNOSIS — Z821 Family history of blindness and visual loss: Secondary | ICD-10-CM

## 2015-07-30 DIAGNOSIS — Z6841 Body Mass Index (BMI) 40.0 and over, adult: Secondary | ICD-10-CM | POA: Diagnosis not present

## 2015-07-30 DIAGNOSIS — K219 Gastro-esophageal reflux disease without esophagitis: Secondary | ICD-10-CM | POA: Diagnosis present

## 2015-07-30 DIAGNOSIS — O1410 Severe pre-eclampsia, unspecified trimester: Secondary | ICD-10-CM

## 2015-07-30 DIAGNOSIS — O163 Unspecified maternal hypertension, third trimester: Secondary | ICD-10-CM | POA: Diagnosis present

## 2015-07-30 DIAGNOSIS — O9962 Diseases of the digestive system complicating childbirth: Secondary | ICD-10-CM | POA: Diagnosis present

## 2015-07-30 DIAGNOSIS — O99214 Obesity complicating childbirth: Secondary | ICD-10-CM | POA: Diagnosis present

## 2015-07-30 DIAGNOSIS — Z8249 Family history of ischemic heart disease and other diseases of the circulatory system: Secondary | ICD-10-CM

## 2015-07-30 DIAGNOSIS — O99284 Endocrine, nutritional and metabolic diseases complicating childbirth: Secondary | ICD-10-CM | POA: Diagnosis present

## 2015-07-30 DIAGNOSIS — O9902 Anemia complicating childbirth: Secondary | ICD-10-CM | POA: Diagnosis present

## 2015-07-30 DIAGNOSIS — D649 Anemia, unspecified: Secondary | ICD-10-CM | POA: Diagnosis present

## 2015-07-30 DIAGNOSIS — Z833 Family history of diabetes mellitus: Secondary | ICD-10-CM | POA: Diagnosis not present

## 2015-07-30 DIAGNOSIS — O9989 Other specified diseases and conditions complicating pregnancy, childbirth and the puerperium: Secondary | ICD-10-CM | POA: Diagnosis present

## 2015-07-30 DIAGNOSIS — O10919 Unspecified pre-existing hypertension complicating pregnancy, unspecified trimester: Secondary | ICD-10-CM | POA: Diagnosis present

## 2015-07-30 HISTORY — DX: Unspecified ovarian cyst, unspecified side: N83.209

## 2015-07-30 LAB — CBC
HEMATOCRIT: 29.7 % — AB (ref 36.0–46.0)
Hemoglobin: 8.8 g/dL — ABNORMAL LOW (ref 12.0–15.0)
MCH: 22.2 pg — ABNORMAL LOW (ref 26.0–34.0)
MCHC: 29.6 g/dL — AB (ref 30.0–36.0)
MCV: 75 fL — AB (ref 78.0–100.0)
PLATELETS: 301 10*3/uL (ref 150–400)
RBC: 3.96 MIL/uL (ref 3.87–5.11)
RDW: 17.1 % — ABNORMAL HIGH (ref 11.5–15.5)
WBC: 13 10*3/uL — ABNORMAL HIGH (ref 4.0–10.5)

## 2015-07-30 LAB — COMPREHENSIVE METABOLIC PANEL
ALT: 9 U/L — ABNORMAL LOW (ref 14–54)
AST: 15 U/L (ref 15–41)
Albumin: 2.7 g/dL — ABNORMAL LOW (ref 3.5–5.0)
Alkaline Phosphatase: 157 U/L — ABNORMAL HIGH (ref 38–126)
Anion gap: 10 (ref 5–15)
BILIRUBIN TOTAL: 1.2 mg/dL (ref 0.3–1.2)
BUN: 7 mg/dL (ref 6–20)
CO2: 19 mmol/L — ABNORMAL LOW (ref 22–32)
CREATININE: 0.37 mg/dL — AB (ref 0.44–1.00)
Calcium: 8.4 mg/dL — ABNORMAL LOW (ref 8.9–10.3)
Chloride: 106 mmol/L (ref 101–111)
Glucose, Bld: 78 mg/dL (ref 65–99)
POTASSIUM: 3.8 mmol/L (ref 3.5–5.1)
Sodium: 135 mmol/L (ref 135–145)
TOTAL PROTEIN: 6.3 g/dL — AB (ref 6.5–8.1)

## 2015-07-30 LAB — PROTEIN / CREATININE RATIO, URINE
CREATININE, URINE: 218 mg/dL
PROTEIN CREATININE RATIO: 0.09 mg/mg{creat} (ref 0.00–0.15)
Total Protein, Urine: 19 mg/dL

## 2015-07-30 LAB — TYPE AND SCREEN
ABO/RH(D): A POS
ANTIBODY SCREEN: NEGATIVE

## 2015-07-30 LAB — ABO/RH: ABO/RH(D): A POS

## 2015-07-30 MED ORDER — LIDOCAINE HCL (PF) 1 % IJ SOLN
30.0000 mL | INTRAMUSCULAR | Status: DC | PRN
  Filled 2015-07-30: qty 30

## 2015-07-30 MED ORDER — OXYTOCIN BOLUS FROM INFUSION
500.0000 mL | INTRAVENOUS | Status: DC
  Administered 2015-07-31: 500 mL via INTRAVENOUS

## 2015-07-30 MED ORDER — LACTATED RINGERS IV SOLN: 500.0000 mL | INTRAVENOUS | Status: DC | PRN

## 2015-07-30 MED ORDER — FLEET ENEMA 7-19 GM/118ML RE ENEM: 1.0000 | ENEMA | RECTAL | Status: DC | PRN

## 2015-07-30 MED ORDER — MISOPROSTOL 25 MCG QUARTER TABLET
25.0000 ug | ORAL_TABLET | ORAL | Status: DC
  Administered 2015-07-30 (×3): 25 ug via VAGINAL
  Filled 2015-07-30 (×2): qty 1
  Filled 2015-07-30 (×3): qty 0.25
  Filled 2015-07-30: qty 1

## 2015-07-30 MED ORDER — OXYTOCIN 40 UNITS IN LACTATED RINGERS INFUSION - SIMPLE MED
2.5000 [IU]/h | INTRAVENOUS | Status: DC
  Filled 2015-07-30: qty 1000

## 2015-07-30 MED ORDER — LEVOTHYROXINE SODIUM 112 MCG PO TABS
224.0000 ug | ORAL_TABLET | Freq: Every day | ORAL | Status: DC
Start: 2015-07-31 — End: 2015-08-01
  Administered 2015-07-31: 224 ug via ORAL
  Filled 2015-07-30 (×5): qty 2

## 2015-07-30 MED ORDER — OXYTOCIN 40 UNITS IN LACTATED RINGERS INFUSION - SIMPLE MED: 1.0000 m[IU]/min | INTRAVENOUS | Status: DC

## 2015-07-30 MED ORDER — ZOLPIDEM TARTRATE 5 MG PO TABS: 5.0000 mg | ORAL_TABLET | Freq: Every evening | ORAL | Status: DC | PRN

## 2015-07-30 MED ORDER — ONDANSETRON HCL 4 MG/2ML IJ SOLN: 4.0000 mg | Freq: Four times a day (QID) | INTRAMUSCULAR | Status: DC | PRN

## 2015-07-30 MED ORDER — LACTATED RINGERS IV SOLN
INTRAVENOUS | Status: DC
  Administered 2015-07-30 – 2015-07-31 (×3): via INTRAVENOUS

## 2015-07-30 MED ORDER — SOD CITRATE-CITRIC ACID 500-334 MG/5ML PO SOLN: 30.0000 mL | ORAL | Status: DC | PRN

## 2015-07-30 MED ORDER — CEFADROXIL 500 MG PO CAPS
500.0000 mg | ORAL_CAPSULE | Freq: Two times a day (BID) | ORAL | Status: DC
Start: 2015-07-30 — End: 2015-08-02
  Administered 2015-07-30 – 2015-08-01 (×5): 500 mg via ORAL
  Filled 2015-07-30 (×6): qty 1

## 2015-07-30 NOTE — H&P (Signed)
LABOR AND DELIVERY ADMISSION HISTORY AND PHYSICAL NOTE  Kimberly [name] is a 25 y.o. female G2P0010 with IUP at [redacted]w[redacted]d by L/10 presenting for iol, referred from clinic, for chronic hypetension, seeing floaters for the past few days, none today. No ctxns, ha, upper abdominal pain, sob.   She reports positive fetal movement. She denies leakage of fluid or vaginal bleeding.  Prenatal History/Complications:  Past Medical History: Past Medical History  Diagnosis Date  . Gastric reflux   . Anxiety   . Depression   . Scoliosis   . 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 Asthma     "grew out of it" (11/16/2012)  . Iron deficiency anemia   . Migraines     "weekly" (11/16/2012)  . Schizophrenia ([place])     "moderate" (11/16/2012)  . Dysrhythmia     ST (11/16/2012)  . Complication of anesthesia     "takes a lot to put me to sleep; woke up completely mid endoscopy" (11/16/2012)  . Ovarian cyst     Past Surgical History: Past Surgical History  Procedure Laterality Date  . Upper gi endoscopy  ~ 2006; ~2008    Obstetrical History: OB History    Gravida Para Term Preterm AB TAB SAB Ectopic Multiple Living   2 0   1  1   0      Social History: Social History   Social History  . Marital Status: Single    Spouse Name: N/A  . Number of Children: N/A  . Years of Education: N/A   Social History Main Topics  . Smoking status: Never Smoker   . Smokeless tobacco: Never Used  . Alcohol Use: No  . Drug Use: No  . Sexual Activity: Yes    Birth Control/ Protection: None   Other Topics Concern  . None   Social History Narrative    Family History: Family History  Problem Relation Age of Onset  . Thyroid disease Maternal Grandmother   . Diabetes Mother   . Hypertension Mother   . Vision loss Mother   . Hypertension Father   . Hyperlipidemia Father     Allergies: Allergies  Allergen Reactions  . Sulfa Antibiotics Swelling  . Abilify [Aripiprazole]    Lethargy, unable to function  . Amoxicillin-Pot Clavulanate Other (See Comments)    Stomach pains Has patient had a PCN reaction causing immediate rash, facial/tongue/throat swelling, SOB or lightheadedness with hypotension: No Has patient had a PCN reaction causing severe rash involving mucus membranes or skin necrosis: No Has patient had a PCN reaction that required hospitalization No Has patient had a PCN reaction occurring within the last 10 years: Yes If all of the above answers are "NO", then may proceed with Cephalosporin use.   . Cephalexin Other (See Comments)    Stomach pains   . Ferrous Sulfate Nausea And Vomiting  . Keflex [Cephalexin]     Rash--able to take w/ Benadryl  . Latex     Swelling, burning of skin .   Marland Kitchen Tape     Swelling, burning of skin .   Marland Kitchen Zicam Cold Remedy [Erysidoron #1] Nausea And Vomiting  . Vicodin [Hydrocodone-Acetaminophen] Hives and Rash    Prescriptions prior to admission  Medication Sig Dispense Refill Last Dose  . calcium carbonate (TUMS - DOSED IN MG ELEMENTAL CALCIUM) 500 MG chewable tablet Chew 2 tablets by mouth daily as needed for indigestion or heartburn. Reported on 07/24/2015   Past Month at Unknown time  .  cefadroxil (DURICEF) 500 MG capsule Take 1 capsule (500 mg total) by mouth 2 (two) times daily. 14 capsule 0 07/30/2015 at Unknown time  . levothyroxine (SYNTHROID, LEVOTHROID) 175 MCG tablet Take 175 mcg by mouth daily before breakfast.   07/30/2015 at Unknown time  . OMEPRAZOLE PO Take 1 tablet by mouth daily as needed (indigestion).    Past Week at Unknown time  . Prenat w/o A Vit-FeFum-FePo-FA (CONCEPT OB) 130-92.4-1 MG CAPS Take 1 capsule by mouth daily. 30 capsule 11 Past Week at Unknown time  . levothyroxine (SYNTHROID, LEVOTHROID) 112 MCG tablet Take 2 tablets (224 mcg total) by mouth daily before breakfast. (Patient not taking: Reported on 07/30/2015) 60 tablet 2      Review of Systems   All systems reviewed and negative except  as stated in HPI  Blood pressure 117/96, pulse 110, temperature 98.5 F (36.9 C), temperature source Oral, resp. rate 18, height 5\' 4"  (1.626 m), weight 251 lb (113.853 kg), last menstrual period 10/29/2014. General appearance: alert, cooperative and appears stated age Lungs: clear to auscultation bilaterally Heart: regular rate and rhythm Abdomen: soft, non-tender; bowel sounds normal Extremities: No calf swelling or tenderness Presentation: cephalic  Fetal monitoring: 140/mod/+a/-d Uterine activity: q 5-10  Dilation: 1.5 Effacement (%): Thick Station: -3 Exam by:: Annaleia Pence, MD   Prenatal labs: ABO, Rh: --/--/A POS (07/11 1120) Antibody: NEG (07/11 1120) Rubella: !Error!not immune RPR: NON REAC (05/11 1438)  HBsAg: NEGATIVE (01/16 1006)  HIV: NONREACTIVE (05/11 1438)  GBS: Negative (07/05 0000)  1 hr Glucola: 120s Genetic screening:  Quad wnl Anatomy US: wnl  Prenatal Transfer Tool  Maternal Diabetes: No Genetic Screening: Normal Maternal Ultrasounds/Referrals: Normal Fetal Ultrasounds or other Referrals:  None Maternal Substance Abuse:  No Significant Maternal Medications:  Levothyroxine Significant Maternal Lab Results: Lab values include: Group B Strep negative  Results for orders placed or performed during the hospital encounter of 07/30/15 (from the past 24 hour(s))  CBC   Collection Time: 07/30/15 11:20 AM  Result Value Ref Range   WBC 13.0 (H) 4.0 - 10.5 K/uL   RBC 3.96 3.87 - 5.11 MIL/uL   Hemoglobin 8.8 (L) 12.0 - 15.0 g/dL   HCT 29.7 (L) 36.0 - 46.0 %   MCV 75.0 (L) 78.0 - 100.0 fL   MCH 22.2 (L) 26.0 - 34.0 pg   MCHC 29.6 (L) 30.0 - 36.0 g/dL   RDW 17.1 (H) 11.5 - 15.5 %   Platelets 301 150 - 400 K/uL  Type and screen [place]   Collection Time: 07/30/15 11:20 AM  Result Value Ref Range   ABO/RH(D) A POS    Antibody Screen NEG    Sample Expiration 08/02/2015     Patient Active Problem List   Diagnosis Date Noted  . Chronic  hypertension in pregnancy 07/30/2015  . Anemia of mother in pregnancy, antepartum 07/25/2015  . Hypertension affecting pregnancy, antepartum 07/24/2015  . Ovarian cyst affecting pregnancy, antepartum 04/25/2015  . Supervision of high risk pregnancy, antepartum 03/11/2015  . Thyroid disease during pregnancy 03/11/2015  . Rubella non-immune status, antepartum 03/11/2015  . 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 Goiter 03/01/2011  . Acanthosis nigricans, acquired 03/01/2011  . Oligomenorrhea 03/01/2011  . Hirsutism 03/01/2011  . PCOS (polycystic ovarian syndrome) 03/01/2011  . Hypertension 03/01/2011  . GERD (gastroesophageal reflux disease) 03/01/2011  . Anxiety and depression 03/01/2011  . Pre-diabetes 07/03/2010  . Maternal morbid obesity, antepartum ([place]) 07/03/2010    Assessment: Clarise Cruz  JOLETTA PERRIER is a 25 y.o. G2P0010 at [redacted]w[redacted]d here for iol for chtn  #Labor: cytotec, then foley, then pitocin #Pain: Eventual epidural #FWB:  cat 1 #ID:  gbs neg #MOF: breast #MOC: OCPs #Circ:  Yes, outpt #chtn: no s/s severe disease currently, labs pending, will follow closely #Anemia: H 8.8, noted #recent UTI: no culture sent, asymptomatic, will continue abx #RNI: vaccinate PP  Carmencita Cusic B Yaa Donnellan 07/30/2015, 2:16 PM

## 2015-07-30 NOTE — Progress Notes (Signed)
Subjective:  KARILYN CRUSER is a 25 y.o. G2P0010 at [redacted]w[redacted]d being seen today for ongoing prenatal care.  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  Patient reports no complaints.   .  .  Movement: Present. Denies leaking of fluid.   The following portions of the patient's history were reviewed and updated as appropriate: allergies, current medications, past family history, past medical history, past social history, past surgical history and problem list. Problem list updated.  Objective:   Filed Vitals:   07/30/15 0800 07/30/15 0827 07/30/15 0910  BP: 115/98 110/91 116/92  Pulse: 108      Fetal Status: Fetal Heart Rate (bpm): NST-R   Movement: Present     General:  Alert, oriented and cooperative. Patient is in no acute distress.  Skin: Skin is warm and dry. No rash noted.   Cardiovascular: Normal heart rate noted  Respiratory: Normal respiratory effort, no problems with respiration noted  Abdomen: Soft, gravid, appropriate for gestational age.       Pelvic:  Cervical exam performed Dilation: 1 Effacement (%): 30 Station: -3  Extremities: Normal range of motion.     Mental Status: Normal mood and affect. Normal behavior. Normal judgment and thought content.   Urinalysis:      Assessment and Plan:  Pregnancy: G2P0010 at [redacted]w[redacted]d  1. Hypertension affecting pregnancy, antepartum,  third trimester  - Fetal nonstress test  2. Preeclampsia, severe, unspecified trimester --Pt reports new onset scotoma x 1-2 weeks, denies h/a, epigastric pain.  Discussed with Dr Harolyn Rutherford.  IOL for severe features with elevated BP. Membranes swept during exam today in office.  Pt sent to [facility] for [facility].   Return in about 6 weeks (around 09/10/2015) for PP visit.  IOL today.   Elvera Maria, CNM

## 2015-07-30 NOTE — Progress Notes (Signed)
LABOR PROGRESS NOTE  Kimberly [name] is a 25 y.o. G2P0010 at [redacted]w[redacted]d  admitted for IOL for cHTN.   Subjective: Resting comfortably. Anxious about and pain relief options. Reassured that she does not need to have the foley right now, and we can offer her multiple pain relief options if she gets to that point.  Objective: BP 128/84 mmHg  Pulse 108  Temp(Src) 98.7 F (37.1 C) (Oral)  Resp 18  Ht 5\' 4"  (1.626 m)  Wt 113.853 kg (251 lb)  BMI 43.06 kg/m2  LMP 10/29/2014 (Exact Date) or  Filed Vitals:   07/30/15 1503 07/30/15 1600 07/30/15 1737 07/30/15 1811  BP: 104/69 112/71 128/84   Pulse: 110 113 108   Temp:    98.7 F (37.1 C)  TempSrc:    Oral  Resp: 18  18   Height:      Weight:         Dilation: 2.5 Effacement (%): 30 Station: -3 Presentation: Vertex Exam by:: Lindell Noe, MD  Labs: Lab Results  Component Value Date   WBC 13.0* 07/30/2015   HGB 8.8* 07/30/2015   HCT 29.7* 07/30/2015   MCV 75.0* 07/30/2015   PLT 301 07/30/2015    Patient Active Problem List   Diagnosis Date Noted  . Chronic hypertension in pregnancy 07/30/2015  . Anemia of mother in pregnancy, antepartum 07/25/2015  . Hypertension affecting pregnancy, antepartum 07/24/2015  . Ovarian cyst affecting pregnancy, antepartum 04/25/2015  . Supervision of high risk pregnancy, antepartum 03/11/2015  . Thyroid disease during pregnancy 03/11/2015  . Rubella non-immune status, antepartum 03/11/2015  . 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 Goiter 03/01/2011  . Acanthosis nigricans, acquired 03/01/2011  . Oligomenorrhea 03/01/2011  . Hirsutism 03/01/2011  . PCOS (polycystic ovarian syndrome) 03/01/2011  . Hypertension 03/01/2011  . GERD (gastroesophageal reflux disease) 03/01/2011  . Anxiety and depression 03/01/2011  . Pre-diabetes 07/03/2010  . Maternal morbid obesity, antepartum ([place]) 07/03/2010    Assessment / Plan: 25 y.o. G2P0010 at [redacted]w[redacted]d here for IOL 2/ cHTN with SIPE.    Labor: s/p cytotec x2 Fetal Wellbeing:  Cat 1 Pain Control:  None right now Anticipated MOD:  SVD  Ralene Ok, MD 07/30/2015, 6:13 PM

## 2015-07-30 NOTE — Anesthesia Pain Management Evaluation Note (Signed)
  CRNA Pain Management Visit Note  Patient: Kimberly [name], 25 y.o., female  "Hello I am a member of the anesthesia team at [facility]. We have an anesthesia team available at all times to provide care throughout the hospital, including epidural management and anesthesia for C-section. I don't know your plan for the delivery whether it a natural birth, water birth, IV sedation, nitrous supplementation, doula or epidural, but we want to meet your pain goals."   1.Was your pain managed to your expectations on prior hospitalizations?    2.What is your expectation for pain management during this hospitalization?     3.How can we help you reach that goal?   Record the patient's initial score and the patient's pain goal.   Pain: 0  Pain Goal: 5 The [facility] wants you to be able to say your pain was always managed very well.  Jabier Mutton 07/30/2015

## 2015-07-30 NOTE — Progress Notes (Signed)
Discussed BP readings with Fatima Blank, CNM.

## 2015-07-31 ENCOUNTER — Encounter (HOSPITAL_COMMUNITY): Payer: Self-pay | Admitting: Anesthesiology

## 2015-07-31 ENCOUNTER — Inpatient Hospital Stay (HOSPITAL_COMMUNITY): Payer: Medicaid Other | Admitting: Anesthesiology

## 2015-07-31 DIAGNOSIS — Z3A39 39 weeks gestation of pregnancy: Secondary | ICD-10-CM

## 2015-07-31 DIAGNOSIS — O9902 Anemia complicating childbirth: Secondary | ICD-10-CM

## 2015-07-31 DIAGNOSIS — D649 Anemia, unspecified: Secondary | ICD-10-CM

## 2015-07-31 DIAGNOSIS — O1092 Unspecified pre-existing hypertension complicating childbirth: Secondary | ICD-10-CM

## 2015-07-31 LAB — CBC WITH DIFFERENTIAL/PLATELET
BASOS ABS: 0 10*3/uL (ref 0.0–0.1)
BASOS PCT: 0 %
EOS ABS: 0 10*3/uL (ref 0.0–0.7)
Eosinophils Relative: 0 %
HEMATOCRIT: 27.9 % — AB (ref 36.0–46.0)
HEMOGLOBIN: 8.1 g/dL — AB (ref 12.0–15.0)
Lymphocytes Relative: 16 %
Lymphs Abs: 2.5 10*3/uL (ref 0.7–4.0)
MCH: 21.9 pg — ABNORMAL LOW (ref 26.0–34.0)
MCHC: 29 g/dL — AB (ref 30.0–36.0)
MCV: 75.4 fL — ABNORMAL LOW (ref 78.0–100.0)
MONOS PCT: 4 %
Monocytes Absolute: 0.7 10*3/uL (ref 0.1–1.0)
NEUTROS ABS: 12.2 10*3/uL — AB (ref 1.7–7.7)
NEUTROS PCT: 80 %
Platelets: 273 10*3/uL (ref 150–400)
RBC: 3.7 MIL/uL — AB (ref 3.87–5.11)
RDW: 17.2 % — ABNORMAL HIGH (ref 11.5–15.5)
WBC: 15.4 10*3/uL — AB (ref 4.0–10.5)

## 2015-07-31 LAB — RPR: RPR Ser Ql: NONREACTIVE

## 2015-07-31 MED ORDER — DIPHENHYDRAMINE HCL 50 MG/ML IJ SOLN: 12.5000 mg | INTRAMUSCULAR | Status: DC | PRN

## 2015-07-31 MED ORDER — LIDOCAINE HCL (PF) 1 % IJ SOLN
INTRAMUSCULAR | Status: DC | PRN
  Administered 2015-07-31 (×2): 4 mL via EPIDURAL

## 2015-07-31 MED ORDER — ACETAMINOPHEN 325 MG PO TABS
650.0000 mg | ORAL_TABLET | ORAL | Status: DC | PRN
  Administered 2015-07-31: 650 mg via ORAL
  Filled 2015-07-31: qty 2

## 2015-07-31 MED ORDER — EPHEDRINE 5 MG/ML INJ
10.0000 mg | INTRAVENOUS | Status: DC | PRN
  Filled 2015-07-31: qty 2

## 2015-07-31 MED ORDER — PHENYLEPHRINE 40 MCG/ML (10ML) SYRINGE FOR IV PUSH (FOR BLOOD PRESSURE SUPPORT)
80.0000 ug | PREFILLED_SYRINGE | INTRAVENOUS | Status: DC | PRN
  Filled 2015-07-31: qty 5
  Filled 2015-07-31: qty 10

## 2015-07-31 MED ORDER — LACTATED RINGERS IV SOLN: 500.0000 mL | Freq: Once | INTRAVENOUS | Status: DC

## 2015-07-31 MED ORDER — PHENYLEPHRINE 40 MCG/ML (10ML) SYRINGE FOR IV PUSH (FOR BLOOD PRESSURE SUPPORT)
80.0000 ug | PREFILLED_SYRINGE | INTRAVENOUS | Status: DC | PRN
  Filled 2015-07-31: qty 5

## 2015-07-31 MED ORDER — TERBUTALINE SULFATE 1 MG/ML IJ SOLN
0.2500 mg | Freq: Once | INTRAMUSCULAR | Status: DC | PRN
  Filled 2015-07-31: qty 1

## 2015-07-31 MED ORDER — FENTANYL CITRATE (PF) 100 MCG/2ML IJ SOLN
100.0000 ug | INTRAMUSCULAR | Status: DC | PRN
  Administered 2015-07-31 (×2): 100 ug via INTRAVENOUS
  Filled 2015-07-31 (×2): qty 2

## 2015-07-31 MED ORDER — FENTANYL 2.5 MCG/ML BUPIVACAINE 1/10 % EPIDURAL INFUSION (WH - ANES)
14.0000 mL/h | INTRAMUSCULAR | Status: DC | PRN
  Administered 2015-07-31: 14 mL/h via EPIDURAL
  Filled 2015-07-31 (×2): qty 125

## 2015-07-31 MED ORDER — OXYTOCIN 40 UNITS IN LACTATED RINGERS INFUSION - SIMPLE MED
1.0000 m[IU]/min | INTRAVENOUS | Status: DC
  Administered 2015-07-31: 2 m[IU]/min via INTRAVENOUS

## 2015-07-31 NOTE — Progress Notes (Signed)
Patient is 25 y.o. G2P0010 [redacted]w[redacted]d admitted IOL 2/2 hx of chronic hypertension, hypothyroid, and anemia.   Delivery Note At 10:59 PM a viable female was delivered via Vaginal, Spontaneous Delivery (Presentation: Right Occiput Anterior).  APGAR: pending; weight: pending .   Placenta status: delivered, intact.  Cord:  with the following complications: none; donated blood.  Anesthesia: Epidural  Episiotomy: None Lacerations: none Suture Repair: none Est. Blood Loss (mL): 150    Mom to postpartum.  Baby to Couplet care / Skin to Skin.  Vella Kohler 07/31/2015, 11:17 PM      Upon arrival patient was complete and pushing. She pushed with good maternal effort to deliver a healthy baby female. Baby delivered without difficulty, was noted to have good tone and place on maternal abdomen for oral suctioning, drying and stimulation. Delayed cord clamping performed. Placenta delivered intact with 3V cord. Vaginal canal and perineum was inspected and 1st degree lac; hemostatic stable. Pitocin was started and uterus massaged until bleeding slowed. Counts of sharps, instruments, and lap pads were all correct.   I have seen and examined this patient and I agree with the above. See my Delivery Note. Nayelly Laughman 9:15 AM 08/08/2015

## 2015-07-31 NOTE — Progress Notes (Signed)
LABOR PROGRESS NOTE  Kimberly [name] is a 25 y.o. G2P0010 at [redacted]w[redacted]d  admitted for chtn  Subjective: Cramping pain  Objective: BP 128/70 mmHg  Pulse 102  Temp(Src) 98.4 F (36.9 C) (Oral)  Resp 18  Ht 5\' 4"  (1.626 m)  Wt 251 lb (113.853 kg)  BMI 43.06 kg/m2  LMP 10/29/2014 (Exact Date) or  Filed Vitals:   07/30/15 1915 07/30/15 1950 07/30/15 2225 07/31/15 0300  BP: 115/85 118/84 122/82 128/70  Pulse: 127 105 108 102  Temp:  97.7 F (36.5 C) 98.6 F (37 C) 98.4 F (36.9 C)  TempSrc:  Oral Oral Oral  Resp:  20 20 18   Height:      Weight:        140/md/-a/-d  Dilation: 2.5 Effacement (%): Thick Station: -3 Presentation: Vertex Exam by:: Tamera Punt, RN  Labs: Lab Results  Component Value Date   WBC 13.0* 07/30/2015   HGB 8.8* 07/30/2015   HCT 29.7* 07/30/2015   MCV 75.0* 07/30/2015   PLT 301 07/30/2015    Patient Active Problem List   Diagnosis Date Noted  . Chronic hypertension in pregnancy 07/30/2015  . Anemia of mother in pregnancy, antepartum 07/25/2015  . Hypertension affecting pregnancy, antepartum 07/24/2015  . Ovarian cyst affecting pregnancy, antepartum 04/25/2015  . Supervision of high risk pregnancy, antepartum 03/11/2015  . Thyroid disease during pregnancy 03/11/2015  . Rubella non-immune status, antepartum 03/11/2015  . 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 Goiter 03/01/2011  . Acanthosis nigricans, acquired 03/01/2011  . Oligomenorrhea 03/01/2011  . Hirsutism 03/01/2011  . PCOS (polycystic ovarian syndrome) 03/01/2011  . Hypertension 03/01/2011  . GERD (gastroesophageal reflux disease) 03/01/2011  . Anxiety and depression 03/01/2011  . Pre-diabetes 07/03/2010  . Maternal morbid obesity, antepartum ([place]) 07/03/2010    Assessment / Plan: 25 y.o. G2P0010 at [redacted]w[redacted]d here for iol for chtn  Labor: last cytotec 22:30. Contracting too frequently to place. Cervix still unfavorable. Foley placed just now. cytotec when able,  pit when foley out Fetal Wellbeing:  Cat 1 Pain Control:  Iv fentanyl prn Anticipated MOD:  Vag Chtn: no severe features  Desma Maxim, MD 07/31/2015, 3:10 AM

## 2015-07-31 NOTE — Anesthesia Procedure Notes (Signed)
Epidural Patient location during procedure: OB Start time: 07/31/2015 1:33 PM  Staffing Anesthesiologist: Josephine Igo Performed by: anesthesiologist   Preanesthetic Checklist Completed: patient identified, site marked, surgical consent, pre-op evaluation, timeout performed, IV checked, risks and benefits discussed and monitors and equipment checked  Epidural Patient position: sitting Prep: site prepped and draped and DuraPrep Patient monitoring: continuous pulse ox and blood pressure Approach: midline Location: L3-L4 Injection technique: LOR air  Needle:  Needle type: Tuohy  Needle gauge: 17 G Needle length: 9 cm and 9 Needle insertion depth: 8 cm Catheter type: closed end flexible Catheter size: 19 Gauge Catheter at skin depth: 14 cm Test dose: negative and Other  Assessment Events: blood not aspirated, injection not painful, no injection resistance, negative IV test and no paresthesia  Additional Notes Patient identified. Risks and benefits discussed including failed block, incomplete  Pain control, post dural puncture headache, nerve damage, paralysis, blood pressure Changes, nausea, vomiting, reactions to medications-both toxic and allergic and post Partum back pain. All questions were answered. Patient expressed understanding and wished to proceed. Sterile technique was used throughout procedure. Epidural site was Dressed with sterile barrier dressing. No paresthesias, signs of intravascular injection Or signs of intrathecal spread were encountered.  Patient was more comfortable after the epidural was dosed. Please see RN's note for documentation of vital signs and FHR which are stable.

## 2015-07-31 NOTE — Progress Notes (Signed)
LABOR PROGRESS NOTE  Kimberly [name] is a 25 y.o. G2P0010 at [redacted]w[redacted]d  admitted for IOL for cHTN.   Subjective: Pt resting comfortably.   Objective: BP 126/80 mmHg  Pulse 119  Temp(Src) 98.3 F (36.8 C) (Oral)  Resp 18  Ht 5\' 4"  (1.626 m)  Wt 113.853 kg (251 lb)  BMI 43.06 kg/m2  LMP 10/29/2014 (Exact Date) or  Filed Vitals:   07/31/15 1501 07/31/15 1610 07/31/15 1631 07/31/15 1701  BP: 125/77  128/85 126/80  Pulse: 102  120 119  Temp:  98.3 F (36.8 C)    TempSrc:  Oral    Resp: 18  18 18   Height:      Weight:         Dilation: 6 Effacement (%): 50 Station: -2 Presentation: Vertex Exam by:: Lindell Noe, MD  Labs: Lab Results  Component Value Date   WBC 15.4* 07/31/2015   HGB 8.1* 07/31/2015   HCT 27.9* 07/31/2015   MCV 75.4* 07/31/2015   PLT 273 07/31/2015    Patient Active Problem List   Diagnosis Date Noted  . Chronic hypertension in pregnancy 07/30/2015  . Anemia of mother in pregnancy, antepartum 07/25/2015  . Hypertension affecting pregnancy, antepartum 07/24/2015  . Ovarian cyst affecting pregnancy, antepartum 04/25/2015  . Supervision of high risk pregnancy, antepartum 03/11/2015  . Thyroid disease during pregnancy 03/11/2015  . Rubella non-immune status, antepartum 03/11/2015  . 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 Goiter 03/01/2011  . Acanthosis nigricans, acquired 03/01/2011  . Oligomenorrhea 03/01/2011  . Hirsutism 03/01/2011  . PCOS (polycystic ovarian syndrome) 03/01/2011  . Hypertension 03/01/2011  . GERD (gastroesophageal reflux disease) 03/01/2011  . Anxiety and depression 03/01/2011  . Pre-diabetes 07/03/2010  . Maternal morbid obesity, antepartum ([place]) 07/03/2010    Assessment / Plan: 25 y.o. G2P0010 at [redacted]w[redacted]d here for IOL for cHTN.   Labor: IUPC placed for lates (although accels and moderate variability present)  Fetal Wellbeing:  See above Pain Control:  S/p epidural Anticipated MOD:  SVD  Ralene Ok, MD 07/31/2015, 5:54 PM

## 2015-07-31 NOTE — Progress Notes (Signed)
LABOR PROGRESS NOTE  Kimberly [name] is a 25 y.o. G2P0010 at [redacted]w[redacted]d  admitted for IOL for cHTN.  Subjective: Pt resting, asking for fentanyl.   Objective: BP 118/75 mmHg  Pulse 115  Temp(Src) 98.4 F (36.9 C) (Oral)  Resp 18  Ht 5\' 4"  (1.626 m)  Wt 113.853 kg (251 lb)  BMI 43.06 kg/m2  LMP 10/29/2014 (Exact Date) or  Filed Vitals:   07/31/15 0300 07/31/15 0736 07/31/15 0943 07/31/15 1001  BP: 128/70  106/65 118/75  Pulse: 102  99 115  Temp: 98.4 F (36.9 C) 98.4 F (36.9 C)    TempSrc: Oral Oral    Resp: 18  18 18   Height:      Weight:         Dilation: 4 Effacement (%): 50 Station: -3 Presentation: Vertex Exam by:: Delorise Shiner, RN   Labs: Lab Results  Component Value Date   WBC 13.0* 07/30/2015   HGB 8.8* 07/30/2015   HCT 29.7* 07/30/2015   MCV 75.0* 07/30/2015   PLT 301 07/30/2015    Patient Active Problem List   Diagnosis Date Noted  . Chronic hypertension in pregnancy 07/30/2015  . Anemia of mother in pregnancy, antepartum 07/25/2015  . Hypertension affecting pregnancy, antepartum 07/24/2015  . Ovarian cyst affecting pregnancy, antepartum 04/25/2015  . Supervision of high risk pregnancy, antepartum 03/11/2015  . Thyroid disease during pregnancy 03/11/2015  . Rubella non-immune status, antepartum 03/11/2015  . 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 Goiter 03/01/2011  . Acanthosis nigricans, acquired 03/01/2011  . Oligomenorrhea 03/01/2011  . Hirsutism 03/01/2011  . PCOS (polycystic ovarian syndrome) 03/01/2011  . Hypertension 03/01/2011  . GERD (gastroesophageal reflux disease) 03/01/2011  . Anxiety and depression 03/01/2011  . Pre-diabetes 07/03/2010  . Maternal morbid obesity, antepartum ([place]) 07/03/2010    Assessment / Plan: 25 y.o. G2P0010 at [redacted]w[redacted]d here for IOL for cHTN.  Labor: s/p cytotec, foley, pit Fetal Wellbeing:  Cat 1 Pain Control:  S/p fentanyl Anticipated MOD:  SVD  Ralene Ok, MD 07/31/2015, 11:08  AM

## 2015-07-31 NOTE — Progress Notes (Signed)
Patient ID: Kimberly [name], female   DOB: [birth year], 25 y.o.   MRN: [id number]  Comfortable w/ epidural; SROM @ 1141  VSS, afeb, BP 103/87 FHR 145-150s, +accels, no decels, occ mi variables Ctx q 2-3 mins w/ 44mu/min Pit Cx 9/10/0  Active labor/transition cHTN  Check cx in 2 hrs or sooner prn pressure Anticipate SVD  Shamica Moree CNM 07/31/2015 10:08 PM

## 2015-07-31 NOTE — Anesthesia Preprocedure Evaluation (Signed)
Anesthesia Evaluation  Patient identified by MRN, date of birth, ID band Patient awake    Reviewed: Allergy & Precautions, NPO status , Patient's Chart, lab work & pertinent test results  History of Anesthesia Complications (+) history of anesthetic complications  Airway Mallampati: III  TM Distance: >3 FB Neck ROM: Full    Dental no notable dental hx. (+) Teeth Intact   Pulmonary asthma ,    Pulmonary exam normal breath sounds clear to auscultation       Cardiovascular hypertension, Pt. on medications Normal cardiovascular exam Rhythm:Regular Rate:Normal     Neuro/Psych  Headaches, PSYCHIATRIC DISORDERS Anxiety Depression Schizophrenia negative psych ROS   GI/Hepatic Neg liver ROS, GERD  Medicated and Controlled,  Endo/Other  Hypothyroidism Morbid obesity  Renal/GU negative Renal ROS  negative genitourinary   Musculoskeletal Scoliosis   Abdominal (+) + obese,   Peds  Hematology  (+) anemia ,   Anesthesia Other Findings   Reproductive/Obstetrics (+) Pregnancy                             Anesthesia Physical Anesthesia Plan  ASA: III  Anesthesia Plan: Epidural   Post-op Pain Management:    Induction:   Airway Management Planned: Natural Airway  Additional Equipment:   Intra-op Plan:   Post-operative Plan:   Informed Consent: I have reviewed the patients History and Physical, chart, labs and discussed the procedure including the risks, benefits and alternatives for the proposed anesthesia with the patient or authorized representative who has indicated his/her understanding and acceptance.     Plan Discussed with: Anesthesiologist  Anesthesia Plan Comments:         Anesthesia Quick Evaluation

## 2015-07-31 NOTE — Anesthesia Pain Management Evaluation Note (Signed)
  CRNA Pain Management Visit Note  Patient: Kimberly [name], 25 y.o., female  "Hello I am a member of the anesthesia team at [facility]. We have an anesthesia team available at all times to provide care throughout the hospital, including epidural management and anesthesia for C-section. I don't know your plan for the delivery whether it a natural birth, water birth, IV sedation, nitrous supplementation, doula or epidural, but we want to meet your pain goals."   1.Was your pain managed to your expectations on prior hospitalizations?   No prior  2.What is your expectation for pain management during this hospitalization?     Epidural and IV pain meds  3.How can we help you reach that goal? Comfort measures, IV meds epidural  Record the patient's initial score and the patient's pain goal.   Pain: 5  Pain Goal: 10 The [facility] wants you to be able to say your pain was always managed very well.  [facility] 07/31/2015

## 2015-08-01 ENCOUNTER — Encounter (HOSPITAL_COMMUNITY): Payer: Self-pay

## 2015-08-01 MED ORDER — SENNOSIDES-DOCUSATE SODIUM 8.6-50 MG PO TABS: 2.0000 | ORAL_TABLET | ORAL | Status: DC

## 2015-08-01 MED ORDER — ONDANSETRON HCL 4 MG/2ML IJ SOLN: 4.0000 mg | INTRAMUSCULAR | Status: DC | PRN

## 2015-08-01 MED ORDER — ZOLPIDEM TARTRATE 5 MG PO TABS: 5.0000 mg | ORAL_TABLET | Freq: Every evening | ORAL | Status: DC | PRN

## 2015-08-01 MED ORDER — ONDANSETRON HCL 4 MG PO TABS: 4.0000 mg | ORAL_TABLET | ORAL | Status: DC | PRN

## 2015-08-01 MED ORDER — SIMETHICONE 80 MG PO CHEW: 80.0000 mg | CHEWABLE_TABLET | ORAL | Status: DC | PRN

## 2015-08-01 MED ORDER — PNEUMOCOCCAL VAC POLYVALENT 25 MCG/0.5ML IJ INJ
0.5000 mL | INJECTION | INTRAMUSCULAR | Status: DC
  Filled 2015-08-01 (×2): qty 0.5

## 2015-08-01 MED ORDER — LEVOTHYROXINE SODIUM 125 MCG PO TABS
125.0000 ug | ORAL_TABLET | Freq: Every day | ORAL | Status: DC
  Administered 2015-08-02: 125 ug via ORAL
  Filled 2015-08-01: qty 1

## 2015-08-01 MED ORDER — OXYCODONE HCL 5 MG PO TABS
5.0000 mg | ORAL_TABLET | ORAL | Status: AC | PRN
  Administered 2015-08-01 – 2015-08-02 (×3): 5 mg via ORAL
  Filled 2015-08-01 (×3): qty 1

## 2015-08-01 MED ORDER — MEASLES, MUMPS & RUBELLA VAC ~~LOC~~ INJ
0.5000 mL | INJECTION | Freq: Once | SUBCUTANEOUS | Status: AC
  Administered 2015-08-02: 0.5 mL via SUBCUTANEOUS
  Filled 2015-08-01 (×4): qty 0.5

## 2015-08-01 MED ORDER — TETANUS-DIPHTH-ACELL PERTUSSIS 5-2.5-18.5 LF-MCG/0.5 IM SUSP: 0.5000 mL | Freq: Once | INTRAMUSCULAR | Status: DC

## 2015-08-01 MED ORDER — COCONUT OIL OIL: 1.0000 "application " | TOPICAL_OIL | Status: DC | PRN

## 2015-08-01 MED ORDER — IBUPROFEN 600 MG PO TABS
600.0000 mg | ORAL_TABLET | Freq: Four times a day (QID) | ORAL | Status: DC
  Administered 2015-08-01 – 2015-08-02 (×5): 600 mg via ORAL
  Filled 2015-08-01 (×5): qty 1

## 2015-08-01 NOTE — Progress Notes (Signed)
Post Partum Day 1 Subjective: no complaints, up ad lib, voiding and tolerating PO  Objective: Blood pressure 101/50, pulse 109, temperature 97.9 F (36.6 C), temperature source Oral, resp. rate 20, height 5\' 4"  (1.626 m), weight 251 lb (113.853 kg), last menstrual period 10/29/2014, unknown if currently breastfeeding.  Physical Exam:  General: alert, cooperative and no distress Lochia: appropriate Uterine Fundus: firm Incision: n/a DVT Evaluation: No evidence of DVT seen on physical exam.   Recent Labs  07/30/15 1120 07/31/15 1245  HGB 8.8* 8.1*  HCT 29.7* 27.9*    Assessment/Plan: Plan for discharge tomorrow, Breastfeeding and Contraception POPs   LOS: 2 days   LEFTWICH-KIRBY, LISA 08/01/2015, 8:39 PM

## 2015-08-01 NOTE — Progress Notes (Signed)
CLINICAL SOCIAL WORK MATERNAL/CHILD NOTE  Patient Details  Name: Kimberly [name] MRN: [id number] Date of Birth: 07/31/2014  Date:  08/01/2015  Clinical Social Worker Initiating Note:  Kimberly [name] N Kimberly Battie, LCSW MSW Date/ Time Initiated:  08/01/15/1533     Child's Name:  Kimberly [name]   Legal Guardian:  Mother   Need for Interpreter:  None   Date of Referral:  08/01/15     Reason for Referral:  Other (Comment) (hx of anxiety and depression)   Referral Source:  RN   Address:     Phone number:      Household Members:  Significant Other   Natural Supports (not living in the home):  Community, Extended Family, Friends, Immediate Family   Professional Supports: Therapist (MD for psych Meds)   Employment: Full-time   Type of Work: Vet Tech/Vet assistant    Education:  High school graduate   Financial Resources:  Medicaid   Other Resources:  WIC   Cultural/Religious Considerations Which May Impact Care:  none  Strengths:  Ability to meet basic needs , Compliance with medical plan , Home prepared for child    Risk Factors/Current Problems:  Mental Health Concerns    Cognitive State:  Alert , Insightful , Linear Thinking    Mood/Affect:  Comfortable , Relaxed , Happy    CSW Assessment: LCSW completed consult for hx of anxiety/depression, hx of schizophrenia.  LCSW met with [name] alone with baby.  [name] made aware of SW role while in hospital and explained services. [name] very receptive and appreciative of consult.  [name] explained reason and LCSW inquired about her mental health history, issues during pregnancy, current emotional state, and bonding with baby. [name] reports she was dx with anxiety and depression around the age of 15 and has been in and out of therapy and medication management.  LCSW confirmed with [name] that there is not Kimberly dx of Schizophrenia and this was confirmed throughout chart review as well as Psych MD note through Kimberly [name]: Kimberly [name].  [name]'s dx is Panic Disorder,  Dysthymic Disorder, and ADD.  [name] reports she has been on medication in the past Abilify in which she cannot take and Prozac.  [name] reports she weaned self off of Prozac when she found out she was pregnant.  She reports she is meeting with her PCP/provider [name] to discuss returning to Prozac now that she has had the baby.  [name] reports she did not have anxiety or depression during pregnancy and emotionally was stable with no problems/stressors.  [name] reports she has Kimberly therapist with [facility] whom she is active with.  LCSW provided extensive education about PPD, answered [name]'s questions about signs and symptoms and [name] has been very proactive with educating self, reading, and going to education groups about PPD.  She reports she wants to be healthy for her baby and aware she is prone to feeling anxious or depressed with baby.  [name] is aware of support groups through [facility] and reports strong support at home with her mother living next door and boyfriend whom she lives with.  [name] reports she will get 12 weeks off from work to be with baby and bond. She reports her boyfriend works from home and [name] will be available once she goes back to work to help with baby.  [name] has Kimberly case worker through WIC program and reports she will follow up with [place] [facility] with regards to mental health medication. She   does not want to resume medication at this time. No safety concerns or interventions warranted at this time for [name] or requested. [name] made aware of how to reach LCSW  If needs arise.    CSW Plan/Description:  Information/Referral to Community Resources , Patient/Family Education , No Further Intervention Required/No Barriers to Discharge    Kimberly [name] N, LCSW 08/01/2015, 3:34 PM  

## 2015-08-01 NOTE — Progress Notes (Signed)
Pt refuses to get up OOB to attempt to void and do peri care with RN assist. Denies urge to void @ this time."Legs feeling stronger"- per pt.

## 2015-08-01 NOTE — Lactation Note (Signed)
This note was copied from a baby's chart. Lactation Consultation Note Initial visit made.  Breastfeeding consultation services and support information given to mom.  This is her first baby.  Baby is 13 hours old and has not been able to latch.  Mom has good colostrum flow with hand expression and spoon feeding it to baby.  Assisted with positioning baby in football hold. Baby does not open wide and then sucks tongue.  Demonstrated suck training on gloved finger.  Baby initially keeps tongue on roof of mouth but develops a good rhythm after a few minutes.  Baby latches briefly but slips off.  A few drops of colostrum expressed into baby's mouth.  Mom anxious to start pumping so DEBP set up.  Baby is getting bath and will need to be skin to skin for an hour.  I will follow up to instruct mom on pumping. Patient Name: Kimberly [name] [id number] Date: 08/01/2015 Reason for consult: Initial assessment   Maternal Data    Feeding Feeding Type: Breast Fed Length of feed: 5 min  LATCH Score/Interventions Latch: Repeated attempts needed to sustain latch, nipple held in mouth throughout feeding, stimulation needed to elicit sucking reflex. Intervention(s): Skin to skin;Teach feeding cues;Waking techniques Intervention(s): Breast compression;Breast massage;Assist with latch;Adjust position  Audible Swallowing: A few with stimulation Intervention(s): Hand expression;Skin to skin  Type of Nipple: Everted at rest and after stimulation  Comfort (Breast/Nipple): Soft / non-tender     Hold (Positioning): Assistance needed to correctly position infant at breast and maintain latch. Intervention(s): Breastfeeding basics reviewed;Support Pillows;Position options;Skin to skin  LATCH Score: 7  Lactation Tools Discussed/Used Pump Review: Setup, frequency, and cleaning;Milk Storage Initiated by:: Carney Date initiated:: 08/01/15   Consult Status Consult Status: Follow-up Date: 08/02/15 Follow-up type:  In-patient    Ave Filter 08/01/2015, 11:35 AM

## 2015-08-01 NOTE — Anesthesia Postprocedure Evaluation (Signed)
Anesthesia Post Note  Patient: Kimberly [name]  Procedure(s) Performed: * No procedures listed *  Patient location during evaluation: Mother Baby Anesthesia Type: Epidural Level of consciousness: awake and alert Pain management: pain level controlled Vital Signs Assessment: post-procedure vital signs reviewed and stable Respiratory status: spontaneous breathing, nonlabored ventilation and respiratory function stable Cardiovascular status: stable Postop Assessment: no headache, no backache, epidural receding and patient able to bend at knees Anesthetic complications: no     Last Vitals:  Filed Vitals:   08/01/15 0124 08/01/15 0230  BP: 126/79 129/77  Pulse: 126 112  Temp: 37.6 C 37.2 C  Resp: 20 18    Last Pain:  Filed Vitals:   08/01/15 0614  PainSc: 4    Pain Goal: Patients Stated Pain Goal: 0 (08/01/15 0145)               Rayvon Char

## 2015-08-02 ENCOUNTER — Other Ambulatory Visit: Payer: Medicaid Other | Admitting: Family Medicine

## 2015-08-02 MED ORDER — IBUPROFEN 600 MG PO TABS: 600.0000 mg | ORAL_TABLET | Freq: Four times a day (QID) | ORAL | Status: DC

## 2015-08-02 NOTE — Discharge Summary (Signed)
OB Discharge Summary     Patient Name: Kimberly [name] DOB: 03-27-1990 MRN: [id number]  Date of admission: 07/30/2015 Delivering MD: Serita Grammes D   Date of discharge: 08/02/2015  Admitting diagnosis: INCREASED BP Intrauterine pregnancy: [redacted]w[redacted]d     Secondary diagnosis:  Active Problems:   Chronic hypertension in pregnancy  Additional problems: none     Discharge diagnosis: Term Pregnancy Delivered                                                                                                Post partum procedures:none  Augmentation: Pitocin, Cytotec and Foley Balloon  Complications: None  Hospital course:  Induction of Labor With Vaginal Delivery   25 y.o. yo G2P1011 at [redacted]w[redacted]d was admitted to the hospital 07/30/2015 for induction of labor.  Indication for induction: CHTN.  Patient had an uncomplicated labor course as follows: Membrane Rupture Time/Date: 11:41 AM ,07/31/2015   Intrapartum Procedures: Episiotomy: None [1]                                         Lacerations:  None [1]  Patient had delivery of a Viable infant.  Information for the patient's newborn:  Jerianne, Ohmann [id number]  Delivery Method: Vaginal, Spontaneous Delivery (Filed from Delivery Summary)   07/31/2015  Details of delivery can be found in separate delivery note.  Patient had a routine postpartum course. Patient is discharged home 08/02/2015.   Physical exam  Filed Vitals:   08/01/15 0230 08/01/15 1310 08/01/15 1747 08/02/15 0536  BP: 129/77 110/65 101/50 117/98  Pulse: 112 105 109 120  Temp: 99 F (37.2 C) 98.1 F (36.7 C) 97.9 F (36.6 C) 98 F (36.7 C)  TempSrc: Oral Oral Oral Oral  Resp: 18 20 20 20   Height:      Weight:       General: alert, cooperative and no distress Lochia: appropriate Uterine Fundus: firm Incision: N/A DVT Evaluation: No evidence of DVT seen on physical exam. Labs: Lab Results  Component Value Date   WBC 15.4* 07/31/2015   HGB 8.1* 07/31/2015   HCT  27.9* 07/31/2015   MCV 75.4* 07/31/2015   PLT 273 07/31/2015   CMP Latest Ref Rng 07/30/2015  Glucose 65 - 99 mg/dL 78  BUN 6 - 20 mg/dL 7  Creatinine 0.44 - 1.00 mg/dL 0.37(L)  Sodium 135 - 145 mmol/L 135  Potassium 3.5 - 5.1 mmol/L 3.8  Chloride 101 - 111 mmol/L 106  CO2 22 - 32 mmol/L 19(L)  Calcium 8.9 - 10.3 mg/dL 8.4(L)  Total Protein 6.5 - 8.1 g/dL 6.3(L)  Total Bilirubin 0.3 - 1.2 mg/dL 1.2  Alkaline Phos 38 - 126 U/L 157(H)  AST 15 - 41 U/L 15  ALT 14 - 54 U/L 9(L)    Discharge instruction: per After Visit Summary and "Baby and Me Booklet".  After visit meds:    Medication List    STOP taking these medications        calcium  carbonate 500 MG chewable tablet  Commonly known as:  TUMS - dosed in mg elemental calcium     cefadroxil 500 MG capsule  Commonly known as:  DURICEF     CONCEPT OB 130-92.4-1 MG Caps     OMEPRAZOLE PO      TAKE these medications        ibuprofen 600 MG tablet  Commonly known as:  ADVIL,MOTRIN  Take 1 tablet (600 mg total) by mouth every 6 (six) hours.     levothyroxine 175 MCG tablet  Commonly known as:  SYNTHROID, LEVOTHROID  Take 175 mcg by mouth daily before breakfast.        Diet: routine diet  Activity: Advance as tolerated. Pelvic rest for 6 weeks.   Outpatient follow up: Follow up Appt:Future Appointments Date Time Provider Dewy Rose  09/18/2015 1:20 PM Chancy Milroy, MD [facility]   Follow up Visit:No Follow-up on file.  Postpartum contraception: probably COCs  Newborn Data: Live born female  Birth Weight: 8 lb 2 oz (3685 g) APGAR: 7, 9  Baby Feeding: Bottle Disposition:home with mother   08/02/2015 Wyvonnia Dusky, CNM

## 2015-08-02 NOTE — Discharge Instructions (Signed)

## 2015-08-02 NOTE — Lactation Note (Signed)
This note was copied from a baby's chart. Lactation Consultation Note Mom has changed feeding to formula feeding only. Doesn't like breast feeding. Discussed engorgement management. Patient Name: Kimberly [name] [id number] Date: 08/02/2015 Reason for consult: Follow-up assessment   Maternal Data    Feeding    LATCH Score/Interventions                      Lactation Tools Discussed/Used     Consult Status Consult Status: Complete Date: 08/02/15    Theodoro Kalata 08/02/2015, 4:51 AM

## 2015-08-09 ENCOUNTER — Telehealth: Payer: Self-pay | Admitting: General Practice

## 2015-08-09 NOTE — Telephone Encounter (Signed)
Patient called and left message stating the doctor wanted her to be seen after delivery. Patient states they put in a prescription for her synthroid but it's her old dose not the new one.

## 2015-08-21 ENCOUNTER — Other Ambulatory Visit: Payer: Self-pay | Admitting: Advanced Practice Midwife

## 2015-08-21 MED ORDER — LEVOTHYROXINE SODIUM 150 MCG PO TABS: 150.0000 ug | ORAL_TABLET | Freq: Every day | ORAL | 0 refills | Status: DC

## 2015-08-21 NOTE — Telephone Encounter (Signed)
Returned patient's call. She is concerned because she was told the dose of her synthroid was supposed to be reduce to her old dose of 180mcg after she left the hospital but this was not done. Now she is out of her 176mcg and has no refills. Will inbox prescribing provider to get net prescription and will notify patient what is done about the dose.

## 2015-08-21 NOTE — Progress Notes (Signed)
DC dose of Synthroid said 164mcg, not 192mcg (clerical error).  New rx for 19mcg sent to pharmacy.  Has FU 8/30

## 2015-08-22 ENCOUNTER — Ambulatory Visit: Payer: Medicaid Other | Attending: Family Medicine | Admitting: Family Medicine

## 2015-08-22 ENCOUNTER — Encounter: Payer: Self-pay | Admitting: Family Medicine

## 2015-08-22 VITALS — BP 103/73 | HR 85 | Temp 98.7°F | Ht 64.0 in | Wt 224.2 lb

## 2015-08-22 DIAGNOSIS — E038 Other specified hypothyroidism: Secondary | ICD-10-CM | POA: Diagnosis not present

## 2015-08-22 DIAGNOSIS — F418 Other specified anxiety disorders: Secondary | ICD-10-CM

## 2015-08-22 DIAGNOSIS — E071 Dyshormogenetic goiter: Secondary | ICD-10-CM | POA: Diagnosis not present

## 2015-08-22 DIAGNOSIS — Z30011 Encounter for initial prescription of contraceptive pills: Secondary | ICD-10-CM | POA: Diagnosis not present

## 2015-08-22 DIAGNOSIS — K5289 Other specified noninfective gastroenteritis and colitis: Secondary | ICD-10-CM | POA: Diagnosis not present

## 2015-08-22 DIAGNOSIS — Z79899 Other long term (current) drug therapy: Secondary | ICD-10-CM | POA: Diagnosis not present

## 2015-08-22 DIAGNOSIS — F329 Major depressive disorder, single episode, unspecified: Secondary | ICD-10-CM

## 2015-08-22 DIAGNOSIS — F32A Depression, unspecified: Secondary | ICD-10-CM

## 2015-08-22 DIAGNOSIS — F419 Anxiety disorder, unspecified: Secondary | ICD-10-CM

## 2015-08-22 MED ORDER — NORGESTIM-ETH ESTRAD TRIPHASIC 0.18/0.215/0.25 MG-25 MCG PO TABS: 1.0000 | ORAL_TABLET | Freq: Every day | ORAL | 11 refills | Status: DC

## 2015-08-22 MED ORDER — PROMETHAZINE HCL 25 MG PO TABS: 25.0000 mg | ORAL_TABLET | Freq: Three times a day (TID) | ORAL | 0 refills | Status: DC | PRN

## 2015-08-22 MED ORDER — FLUOXETINE HCL 20 MG PO TABS: 20.0000 mg | ORAL_TABLET | Freq: Every day | ORAL | 3 refills | Status: DC

## 2015-08-22 MED ORDER — LEVOTHYROXINE SODIUM 150 MCG PO TABS: 150.0000 ug | ORAL_TABLET | Freq: Every day | ORAL | 1 refills | Status: DC

## 2015-08-22 NOTE — Progress Notes (Signed)
Symptoms started one week ago Would like to start on prozac/ birth control

## 2015-08-22 NOTE — Telephone Encounter (Signed)
Received message from provider regarding synthroid. She has sent in a new synthroid prescription for the patient. Called patient and notified her she can pick her med up at her pharmacy. Understanding voiced.

## 2015-08-22 NOTE — Progress Notes (Signed)
Subjective:  Patient ID: Kimberly [name], female    DOB: 09/25/1990  Age: 25 y.o. MRN: [id number]  CC: Fever; Nausea; and Emesis   HPI Kimberly [name] is a 25 year old 3 week postpartum female with a history of anxiety and depression, hypothyroidism who comes into the clinic to establish care.  Her 6 week postpartum visit comes up on 09/18/15 but she would like to resume her Prozac which she had been taken off during pregnancy and is also requesting a refill of levothyroxine. She would also like to resume birth control pills; she is not nursing.  Complains of nausea, vomiting, diarrhea for the last few days and did have a fever of 101 but has not had any symptoms today but does feel sick. Denies history of sick contacts the family but states her infant now has the symptoms that she has. Not able to keep much down. Past Medical History:  Diagnosis Date  . Anxiety   . Asthma    "grew out of it" (11/16/2012)  . Complication of anesthesia    "takes a lot to put me to sleep; woke up completely mid endoscopy" (11/16/2012)  . Depression   . Dysrhythmia    ST (11/16/2012)  . Gastric reflux   . 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 Iron deficiency anemia   . Migraines    "weekly" (11/16/2012)  . Ovarian cyst   . Schizophrenia ([place])    "moderate" (11/16/2012)  . Scoliosis     Past Surgical History:  Procedure Laterality Date  . UPPER GI ENDOSCOPY  ~ 2006; ~2008    Allergies  Allergen Reactions  . Percocet [Oxycodone-Acetaminophen] Anaphylaxis  . Sulfa Antibiotics Swelling  . Abilify [Aripiprazole]     Lethargy, unable to function  . Amoxicillin-Pot Clavulanate Other (See Comments)    Stomach pains Has patient had a PCN reaction causing immediate rash, facial/tongue/throat swelling, SOB or lightheadedness with hypotension: No Has patient had a PCN reaction causing severe rash involving mucus membranes or skin necrosis: No Has patient had a PCN reaction that  required hospitalization No Has patient had a PCN reaction occurring within the last 10 years: Yes If all of the above answers are "NO", then may proceed with Cephalosporin use.   . Cephalexin Other (See Comments)    Stomach pains Pt taking Cefadroxil as outpatient  . Ferrous Sulfate Nausea And Vomiting  . Keflex [Cephalexin]     Rash--able to take w/ Benadryl  . Latex     Swelling, burning of skin .   Marland Kitchen Tape     Swelling, burning of skin .   Marland Kitchen Zicam Cold Remedy [Erysidoron #1] Nausea And Vomiting  . Vicodin [Hydrocodone-Acetaminophen] Hives and Rash     Outpatient Medications Prior to Visit  Medication Sig Dispense Refill  . ibuprofen (ADVIL,MOTRIN) 600 MG tablet Take 1 tablet (600 mg total) by mouth every 6 (six) hours. 30 tablet 0  . levothyroxine (SYNTHROID, LEVOTHROID) 150 MCG tablet Take 1 tablet (150 mcg total) by mouth daily before breakfast. 30 tablet 0   No facility-administered medications prior to visit.     ROS Review of Systems  Constitutional: Negative for activity change, appetite change and fatigue.  HENT: Negative for congestion, sinus pressure and sore throat.   Eyes: Negative for visual disturbance.  Respiratory: Negative for cough, chest tightness, shortness of breath and wheezing.   Cardiovascular: Negative for chest pain and palpitations.  Gastrointestinal:       See hpi  Endocrine: Negative for polydipsia.  Genitourinary: Negative for dysuria and frequency.  Musculoskeletal: Negative for arthralgias and back pain.  Skin: Negative for rash.  Neurological: Negative for tremors, light-headedness and numbness.  Hematological: Does not bruise/bleed easily.  Psychiatric/Behavioral: Negative for agitation and behavioral problems.    Objective:  BP 103/73 (BP Location: Left Arm, Patient Position: Sitting, Cuff Size: Large)   Pulse 85   Temp 98.7 F (37.1 C) (Oral)   Ht 5\' 4"  (1.626 m)   Wt 224 lb 3.2 oz (101.7 kg)   SpO2 97%   BMI 38.48 kg/m    BP/Weight 08/22/2015 08/02/2015 [id number]  Systolic BP [id number]  Diastolic BP 73 98 92  Wt. (Lbs) 224.2 - 251  BMI 38.48 - 43.06  Some encounter information is confidential and restricted. Go to Review Flowsheets activity to see all data.      Physical Exam  Constitutional: She is oriented to person, place, and time. She appears well-developed and well-nourished.  Cardiovascular: Normal rate, normal heart sounds and intact distal pulses.   No murmur heard. Pulmonary/Chest: Effort normal and breath sounds normal. She has no wheezes. She has no rales. She exhibits no tenderness.  Abdominal: Soft. Bowel sounds are normal. She exhibits no distension and no mass. There is no tenderness.  Musculoskeletal: Normal range of motion.  Neurological: She is alert and oriented to person, place, and time.     Assessment & Plan:   1. Anxiety and depression Stable Resume Prozac She is not currently nursing - FLUoxetine (PROZAC) 20 MG tablet; Take 1 tablet (20 mg total) by mouth daily.  Dispense: 30 tablet; Refill: 3  2. 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  Dispense: 30 tablet; Refill: 1  3. Other noninfectious gastroenteritis Likely viral infection Supportive management-stay hydrated. - promethazine (PHENERGAN) 25 MG tablet; Take 1 tablet (25 mg total) by mouth every 8 (eight) hours as needed for nausea or vomiting.  Dispense: 20 tablet; Refill: 0  4. Oral contraceptive prescribed - Norgestimate-Ethinyl Estradiol Triphasic (ORTHO TRI-CYCLEN LO) 0.18/0.215/0.25 MG-25 MCG tab; Take 1 tablet by mouth daily.  Dispense: 1 Package; Refill: 11   Meds ordered this encounter  Medications  . FLUoxetine (PROZAC) 20 MG tablet    Sig: Take 1 tablet (20 mg total) by mouth daily.    Dispense:  30 tablet     Refill:  3  . levothyroxine (SYNTHROID, LEVOTHROID) 150 MCG tablet    Sig: Take 1 tablet (150 mcg total) by mouth daily before breakfast.    Dispense:  30 tablet    Refill:  1  . promethazine (PHENERGAN) 25 MG tablet    Sig: Take 1 tablet (25 mg total) by mouth every 8 (eight) hours as needed for nausea or vomiting.    Dispense:  20 tablet    Refill:  0  . Norgestimate-Ethinyl Estradiol Triphasic (ORTHO TRI-CYCLEN LO) 0.18/0.215/0.25 MG-25 MCG tab    Sig: Take 1 tablet by mouth daily.    Dispense:  1 Package    Refill:  11    Follow-up: Return in about 1 month (around 09/22/2015) for Follow-up for hypothyroidism.   Arnoldo Morale MD

## 2015-09-18 ENCOUNTER — Ambulatory Visit (INDEPENDENT_AMBULATORY_CARE_PROVIDER_SITE_OTHER): Payer: Medicaid Other | Admitting: Clinical

## 2015-09-18 ENCOUNTER — Encounter: Payer: Self-pay | Admitting: Obstetrics and Gynecology

## 2015-09-18 ENCOUNTER — Ambulatory Visit (INDEPENDENT_AMBULATORY_CARE_PROVIDER_SITE_OTHER): Payer: Medicaid Other | Admitting: Obstetrics and Gynecology

## 2015-09-18 ENCOUNTER — Encounter: Payer: Self-pay | Admitting: Family Medicine

## 2015-09-18 DIAGNOSIS — F4321 Adjustment disorder with depressed mood: Secondary | ICD-10-CM

## 2015-09-18 DIAGNOSIS — Z30011 Encounter for initial prescription of contraceptive pills: Secondary | ICD-10-CM

## 2015-09-18 MED ORDER — NORGESTIM-ETH ESTRAD TRIPHASIC 0.18/0.215/0.25 MG-25 MCG PO TABS: 1.0000 | ORAL_TABLET | Freq: Every day | ORAL | 11 refills | Status: DC

## 2015-09-18 NOTE — Progress Notes (Signed)
  ASSESSMENT: Pt currently experiencing Adjustment disorder with depressed mood. Pt needs to f/u with PCP. Pt would benefit from psychoeducation and brief therapeutic interventions regarding coping with symptoms of depression. Pt may benefit from additional group support.  Stage of Change: determination  PLAN: 1. F/U with behavioral health clinician as needed 2. Psychiatric Medications: Prozac, began 08-22-15(over 3 weeks) 3. Behavioral recommendations:   -Consider Mom Talk mom-led support group at [facility], Tuesdays at [place] educational material regarding coping with symptoms of depression(postpartum, as well as anxiety) -Continue to increase healthy foods into diet, including iron-rich foods -Consider keeping log of headache days and symptoms to take to PCP appointment (along with any changes to food and water intake, and sleep, as possible headache triggers) -Request referral to neurologist at next PCP appointment  SUBJECTIVE: Pt. referred by Selmer Dominion, MD, for symptoms of depression Pt. reports the following symptoms/concerns: Pt states that she stopped taking BH meds during pregnancy, and since starting back on meds, is starting to feel a little better; has great support from FOB and her mother. Pt concerned today that she has been having headaches; admits she sometimes forgets to eat and may not be drinking as much water as needed, is anemic(iron pills make her sick) and has changed sleeping habits postpartum. Duration of problem: Past month Severity: mild   OBJECTIVE: Orientation & Cognition: Oriented x3. Thought processes normal and appropriate to situation. Mood: appropriate Affect: appropriate Appearance: appropriate Risk of harm to self or others: no known risk of harm to self or others Substance use: none Assessments administered: PHQ9: 11/ GAD7: 8  Diagnosis: Adjustment disorder with depressed mood CPT Code:  F43.21  -------------------------------------------- Other(s) present in the room: mother and newborn son  Time spent with patient in exam room: 30 minutes, 2:30-3:00pm  Depression screen [facility] 2/9 09/18/2015 08/22/2015 04/12/2015 08/17/2014  Decreased Interest 1 1 0 0  Down, Depressed, Hopeless 0 2 0 0  PHQ - 2 Score 1 3 0 0  Altered sleeping 2 3 - -  Tired, decreased energy 3 2 - -  Change in appetite 2 3 - -  Feeling bad or failure about yourself  1 1 - -  Trouble concentrating 1 1 - -  Moving slowly or fidgety/restless 1 1 - -  Suicidal thoughts 0 0 - -  PHQ-9 Score 11 14 - -   GAD 7 : Generalized Anxiety Score 09/18/2015 08/22/2015  Nervous, Anxious, on Edge 1 2  Control/stop worrying 1 2  Worry too much - different things 1 2  Trouble relaxing 1 2  Restless 1 2  Easily annoyed or irritable 2 2  Afraid - awful might happen 1 2  Total GAD 7 Score 8 14

## 2015-09-18 NOTE — Progress Notes (Signed)
Subjective:     Kimberly [name] is a 25 y.o. female who presents for a postpartum visit. She is 7 weeks postpartum following a spontaneous vaginal delivery. I have fully reviewed the prenatal and intrapartum course. The delivery was at 74 gestational weeks. Outcome: spontaneous vaginal delivery. Anesthesia: epidural. Postpartum course has been unremarkable. Baby's course has been unremakable. Baby is feeding by bottle - Enfamil with Iron. Bleeding no bleeding. Bowel function is normal. Bladder function is normal. Patient is sexually active. Contraception method is OCP (estrogen/progesterone). Postpartum depression screening: positive.    Review of Systems Pertinent items are noted in HPI.   Objective:    BP 111/68   Pulse (!) 123   Ht 5' 4.5" (1.638 m)   Wt 227 lb 12.8 oz (103.3 kg)   LMP 09/11/2015   Breastfeeding? No   BMI 38.50 kg/m   General:  alert   Breasts:  inspection negative, no nipple discharge or bleeding, no masses or nodularity palpable and deferred  Lungs: clear to auscultation bilaterally  Heart:  regular rate and rhythm, S1, S2 normal, no murmur, click, rub or gallop  Abdomen: soft, non-tender; bowel sounds normal; no masses,  no organomegaly   Vulva:  normal  Vagina: normal vagina  Cervix:  no lesions  Corpus: normal  Adnexa:  normal adnexa  Rectal Exam: Not performed.        Assessment:     Normal postpartum exam.      Plan:    1. Contraception: OCP (estrogen/progesterone)  2. Anxiety/Depression as per PCP. Marland Kitchen

## 2015-09-20 ENCOUNTER — Telehealth: Payer: Self-pay | Admitting: Clinical

## 2015-09-20 NOTE — Telephone Encounter (Signed)
Left HIPPA-compliant message to call Jamie at [facility] at [phone]. 

## 2015-09-25 ENCOUNTER — Telehealth: Payer: Self-pay | Admitting: Clinical

## 2015-09-25 NOTE — Telephone Encounter (Signed)
Message from OB to patient: The birth control pills are not likely causing her headaches, it is recommended that she eat a healthy diet and drink 64oz. Of water daily; if the headaches continue, it is recommended that she talk to her PCP for further evaluation.   Ms.  [name] says that she has not had headaches in the past three days, and attributes that to eating regularly and drinking more water. She also says that if the headaches start back up again, or get worse, she will talk to her PCP about further evaluation.

## 2015-11-01 ENCOUNTER — Encounter: Payer: Self-pay | Admitting: Family Medicine

## 2015-11-01 ENCOUNTER — Ambulatory Visit: Payer: Self-pay | Attending: Family Medicine | Admitting: Family Medicine

## 2015-11-01 VITALS — BP 111/73 | HR 90 | Temp 98.7°F | Ht 64.5 in | Wt 234.8 lb

## 2015-11-01 DIAGNOSIS — M545 Low back pain, unspecified: Secondary | ICD-10-CM

## 2015-11-01 DIAGNOSIS — O3481 Maternal care for other abnormalities of pelvic organs, first trimester: Secondary | ICD-10-CM | POA: Insufficient documentation

## 2015-11-01 DIAGNOSIS — Z880 Allergy status to penicillin: Secondary | ICD-10-CM | POA: Insufficient documentation

## 2015-11-01 DIAGNOSIS — O99341 Other mental disorders complicating pregnancy, first trimester: Secondary | ICD-10-CM | POA: Insufficient documentation

## 2015-11-01 DIAGNOSIS — Z885 Allergy status to narcotic agent status: Secondary | ICD-10-CM | POA: Insufficient documentation

## 2015-11-01 DIAGNOSIS — M419 Scoliosis, unspecified: Secondary | ICD-10-CM | POA: Insufficient documentation

## 2015-11-01 DIAGNOSIS — Z881 Allergy status to other antibiotic agents status: Secondary | ICD-10-CM | POA: Insufficient documentation

## 2015-11-01 DIAGNOSIS — O99011 Anemia complicating pregnancy, first trimester: Secondary | ICD-10-CM | POA: Insufficient documentation

## 2015-11-01 DIAGNOSIS — O26891 Other specified pregnancy related conditions, first trimester: Secondary | ICD-10-CM | POA: Insufficient documentation

## 2015-11-01 DIAGNOSIS — N83202 Unspecified ovarian cyst, left side: Secondary | ICD-10-CM

## 2015-11-01 DIAGNOSIS — O99281 Endocrine, nutritional and metabolic diseases complicating pregnancy, first trimester: Secondary | ICD-10-CM | POA: Insufficient documentation

## 2015-11-01 DIAGNOSIS — F32A Depression, unspecified: Secondary | ICD-10-CM

## 2015-11-01 DIAGNOSIS — F419 Anxiety disorder, unspecified: Secondary | ICD-10-CM

## 2015-11-01 DIAGNOSIS — Z91040 Latex allergy status: Secondary | ICD-10-CM | POA: Insufficient documentation

## 2015-11-01 DIAGNOSIS — N83209 Unspecified ovarian cyst, unspecified side: Secondary | ICD-10-CM | POA: Insufficient documentation

## 2015-11-01 DIAGNOSIS — F329 Major depressive disorder, single episode, unspecified: Secondary | ICD-10-CM

## 2015-11-01 DIAGNOSIS — Z9109 Other allergy status, other than to drugs and biological substances: Secondary | ICD-10-CM | POA: Insufficient documentation

## 2015-11-01 DIAGNOSIS — E071 Dyshormogenetic goiter: Secondary | ICD-10-CM

## 2015-11-01 DIAGNOSIS — F418 Other specified anxiety disorders: Secondary | ICD-10-CM

## 2015-11-01 DIAGNOSIS — Z888 Allergy status to other drugs, medicaments and biological substances status: Secondary | ICD-10-CM | POA: Insufficient documentation

## 2015-11-01 DIAGNOSIS — K219 Gastro-esophageal reflux disease without esophagitis: Secondary | ICD-10-CM | POA: Insufficient documentation

## 2015-11-01 DIAGNOSIS — D509 Iron deficiency anemia, unspecified: Secondary | ICD-10-CM | POA: Insufficient documentation

## 2015-11-01 DIAGNOSIS — O99611 Diseases of the digestive system complicating pregnancy, first trimester: Secondary | ICD-10-CM | POA: Insufficient documentation

## 2015-11-01 DIAGNOSIS — Z3A10 10 weeks gestation of pregnancy: Secondary | ICD-10-CM | POA: Insufficient documentation

## 2015-11-01 DIAGNOSIS — E039 Hypothyroidism, unspecified: Secondary | ICD-10-CM | POA: Insufficient documentation

## 2015-11-01 LAB — TSH: TSH: 15.7 mIU/L — ABNORMAL HIGH

## 2015-11-01 MED ORDER — METHOCARBAMOL 500 MG PO TABS: 500.0000 mg | ORAL_TABLET | Freq: Four times a day (QID) | ORAL | 2 refills | Status: DC

## 2015-11-01 MED ORDER — BUSPIRONE HCL 7.5 MG PO TABS: 7.5000 mg | ORAL_TABLET | Freq: Two times a day (BID) | ORAL | 2 refills | Status: DC

## 2015-11-01 NOTE — Progress Notes (Signed)
Subjective:  Patient ID: Kimberly [name], female    DOB: [birth year]  Age: 25 y.o. MRN: [id number]  CC: Hypothyroidism (needs blood work today); Anxiety; Depression; and Back Pain   HPI Kimberly [name] is a 25 year old female with a history of anxiety and depression, hypothyroidism who presents today for follow-up visit.  She was restarted on Prozac for depression and anxiety at last visit but reports that her depression is controlled but she finds herself anxious every day and this is unrelated to the birth of her baby. She finds herself snapping at people. Also complains of new onset back pain in her lumbar region which does not radiate down her legs but she occasionally feels tightness and cramps in her calf muscles. States she works at [employer] and lifts heavy dogs.  She would also like a follow-up imaging done as she was diagnosed with a left ovarian cyst during pregnancy. Ultrasound report from 01/07/15 revealed simple left ovarian cyst measuring 8.5 x 11.9 x 13.3 cm slightly larger compared to previous CT from 10/10 09/2012 measuring 8.4 x 10.4 x 11.7 cm. She denies any abdominal pelvic pain.  Past Medical History:  Diagnosis Date  . Anxiety   . Asthma    "grew out of it" (11/16/2012)  . Complication of anesthesia    "takes a lot to put me to sleep; woke up completely mid endoscopy" (11/16/2012)  . Depression   . Dysrhythmia    ST (11/16/2012)  . Gastric reflux   . 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 Iron deficiency anemia   . Migraines    "weekly" (11/16/2012)  . Ovarian cyst   . Schizophrenia ([place])    "moderate" (11/16/2012)  . Scoliosis     Past Surgical History:  Procedure Laterality Date  . UPPER GI ENDOSCOPY  ~ 2006; ~2008    Allergies  Allergen Reactions  . Percocet [Oxycodone-Acetaminophen] Anaphylaxis  . Sulfa Antibiotics Swelling  . Abilify [Aripiprazole]     Lethargy, unable to function  . Amoxicillin-Pot Clavulanate  Other (See Comments)    Stomach pains Has patient had a PCN reaction causing immediate rash, facial/tongue/throat swelling, SOB or lightheadedness with hypotension: No Has patient had a PCN reaction causing severe rash involving mucus membranes or skin necrosis: No Has patient had a PCN reaction that required hospitalization No Has patient had a PCN reaction occurring within the last 10 years: Yes If all of the above answers are "NO", then may proceed with Cephalosporin use.   . Cephalexin Other (See Comments)    Stomach pains Pt taking Cefadroxil as outpatient  . Ferrous Sulfate Nausea And Vomiting  . Keflex [Cephalexin]     Rash--able to take w/ Benadryl  . Latex     Swelling, burning of skin .   Marland Kitchen Tape     Swelling, burning of skin .   Marland Kitchen Zicam Cold Remedy [Erysidoron #1] Nausea And Vomiting  . Vicodin [Hydrocodone-Acetaminophen] Hives and Rash     Outpatient Medications Prior to Visit  Medication Sig Dispense Refill  . FLUoxetine (PROZAC) 20 MG tablet Take 1 tablet (20 mg total) by mouth daily. 30 tablet 3  . levothyroxine (SYNTHROID, LEVOTHROID) 150 MCG tablet Take 1 tablet (150 mcg total) by mouth daily before breakfast. 30 tablet 1  . Norgestimate-Ethinyl Estradiol Triphasic (ORTHO TRI-CYCLEN LO) 0.18/0.215/0.25 MG-25 MCG tab Take 1 tablet by mouth daily. 1 Package 11   No facility-administered medications prior to visit.     ROS Review  of Systems  Constitutional: Negative for activity change, appetite change and fatigue.  HENT: Negative for congestion, sinus pressure and sore throat.   Eyes: Negative for visual disturbance.  Respiratory: Negative for cough, chest tightness, shortness of breath and wheezing.   Cardiovascular: Negative for chest pain and palpitations.  Gastrointestinal: Negative for abdominal distention, abdominal pain and constipation.  Endocrine: Negative for polydipsia.  Genitourinary: Negative for dysuria and frequency.  Musculoskeletal: Negative  for arthralgias and back pain.  Skin: Negative for rash.  Neurological: Negative for tremors, light-headedness and numbness.  Hematological: Does not bruise/bleed easily.  Psychiatric/Behavioral: Negative for agitation and behavioral problems.    Objective:  BP 111/73 (BP Location: Right Arm, Patient Position: Sitting, Cuff Size: Large)   Pulse 90   Temp 98.7 F (37.1 C) (Oral)   Ht 5' 4.5" (1.638 m)   Wt 234 lb 12.8 oz (106.5 kg)   SpO2 99%   BMI 39.68 kg/m   BP/Weight 11/01/2015 [id number]  Systolic BP [id number]  Diastolic BP 73 68 73  Wt. (Lbs) 234.8 227.8 224.2  BMI 39.68 38.5 38.48  Some encounter information is confidential and restricted. Go to Review Flowsheets activity to see all data.      Physical Exam  Constitutional: She is oriented to person, place, and time. She appears well-developed and well-nourished.  Cardiovascular: Normal rate, normal heart sounds and intact distal pulses.   No murmur heard. Pulmonary/Chest: Effort normal and breath sounds normal. She has no wheezes. She has no rales. She exhibits no tenderness.  Abdominal: Soft. Bowel sounds are normal. She exhibits no distension and no mass. There is no tenderness.  Musculoskeletal: Normal range of motion.  Neurological: She is alert and oriented to person, place, and time.   CLINICAL DATA:  Lower abdominal/ pelvic cramping. History of left ovarian cyst. Quantitative beta HCG not ordered. Per history, patient is pregnant with estimated gestational age [redacted] weeks 0 days per LMP.  EXAM: OBSTETRIC <14 WK [place] AND TRANSVAGINAL OB US  TECHNIQUE: Both transabdominal and transvaginal ultrasound examinations were performed for complete evaluation of the gestation as well as the maternal uterus, adnexal regions, and pelvic cul-de-sac. Transvaginal technique was performed to assess early pregnancy.  COMPARISON:  CT 11/16/2012  FINDINGS: Intrauterine gestational sac: Visualized/normal in  shape.  Yolk sac:  Visualized.  Embryo:  Visualized.  Cardiac Activity: Visualized.  Heart Rate: 184  bpm  CRL:  2.71  cm   9 w   4 d                  [place] EDC: 08/08/2015  Subchorionic hemorrhage:  None visualized.  Maternal uterus/adnexae: Right ovary is normal. Simple left ovarian cyst measuring 8.5 x 11.9 x 13.3 cm as these measurements are slightly larger compared to the previous CT scan from 11/16/2012 (8.4 x 10.4 x 11.7 cm). No free pelvic fluid.  IMPRESSION: Single live IUP with estimated gestational age [redacted] weeks 4 days.  Slight increase in size of known simple left ovarian cyst measuring 8.5 x 11.9 x 13.3 cm (previously 8.4 x 10.4 x 11.7 cm by CT 11/16/2012).   Electronically Signed   By: Marin Olp M.D.   On: 01/07/2015 13:31  Assessment & Plan:   1. Acute midline low back pain without sciatica X-ray muscle spasm Educated on proper lifting techniques - methocarbamol (ROBAXIN) 500 MG tablet; Take 1 tablet (500 mg total) by mouth 4 (four) times daily.  Dispense: 60 tablet; Refill: 2  2.  Anxiety and depression Continue Prozac; will add BuSpar to her regimen - busPIRone (BUSPAR) 7.5 MG tablet; Take 1 tablet (7.5 mg total) by mouth 2 (two) times daily.  Dispense: 60 tablet; Refill: 2  3. 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 Cyst of left ovary - US Pelvis Complete; Future - US Transvaginal Non-OB; Future   Meds ordered this encounter  Medications  . methocarbamol (ROBAXIN) 500 MG tablet    Sig: Take 1 tablet (500 mg total) by mouth 4 (four) times daily.    Dispense:  60 tablet    Refill:  2  . busPIRone (BUSPAR) 7.5 MG tablet    Sig: Take 1 tablet (7.5 mg total) by mouth 2 (two) times daily.    Dispense:  60 tablet    Refill:  2    Follow-up: Return in about 1 month (around 12/02/2015) for Follow-up of left ovarian cyst.   Arnoldo Morale MD

## 2015-11-01 NOTE — Progress Notes (Signed)
Med refills

## 2015-11-01 NOTE — Patient Instructions (Signed)
Generalized Anxiety Disorder Generalized anxiety disorder (GAD) is a mental disorder. It interferes with life functions, including relationships, work, and school. GAD is different from normal anxiety, which everyone experiences at some point in their lives in response to specific life events and activities. Normal anxiety actually helps us prepare for and get through these life events and activities. Normal anxiety goes away after the event or activity is over.  GAD causes anxiety that is not necessarily related to specific events or activities. It also causes excess anxiety in proportion to specific events or activities. The anxiety associated with GAD is also difficult to control. GAD can vary from mild to severe. People with severe GAD can have intense waves of anxiety with physical symptoms (panic attacks).  SYMPTOMS The anxiety and worry associated with GAD are difficult to control. This anxiety and worry are related to many life events and activities and also occur more days than not for 6 months or longer. People with GAD also have three or more of the following symptoms (one or more in children):  Restlessness.   Fatigue.  Difficulty concentrating.   Irritability.  Muscle tension.  Difficulty sleeping or unsatisfying sleep. DIAGNOSIS GAD is diagnosed through an assessment by your health care provider. Your health care provider will ask you questions aboutyour mood,physical symptoms, and events in your life. Your health care provider may ask you about your medical history and use of alcohol or drugs, including prescription medicines. Your health care provider may also do a physical exam and blood tests. Certain medical conditions and the use of certain substances can cause symptoms similar to those associated with GAD. Your health care provider may refer you to a mental health specialist for further evaluation. TREATMENT The following therapies are usually used to treat GAD:    Medication. Antidepressant medication usually is prescribed for long-term daily control. Antianxiety medicines may be added in severe cases, especially when panic attacks occur.   Talk therapy (psychotherapy). Certain types of talk therapy can be helpful in treating GAD by providing support, education, and guidance. A form of talk therapy called cognitive behavioral therapy can teach you healthy ways to think about and react to daily life events and activities.  Stress managementtechniques. These include yoga, meditation, and exercise and can be very helpful when they are practiced regularly. A mental health specialist can help determine which treatment is best for you. Some people see improvement with one therapy. However, other people require a combination of therapies.   This information is not intended to replace advice given to you by your health care provider. Make sure you discuss any questions you have with your health care provider.   Document Released: 05/02/2012 Document Revised: 01/26/2014 Document Reviewed: 05/02/2012 [employer] Interactive Patient Education 2016 [employer]  

## 2015-11-04 ENCOUNTER — Other Ambulatory Visit: Payer: Self-pay | Admitting: Family Medicine

## 2015-11-04 DIAGNOSIS — E071 Dyshormogenetic goiter: Secondary | ICD-10-CM

## 2015-11-04 MED ORDER — LEVOTHYROXINE SODIUM 175 MCG PO TABS: 175.0000 ug | ORAL_TABLET | Freq: Every day | ORAL | 2 refills | Status: DC

## 2015-11-08 ENCOUNTER — Ambulatory Visit (HOSPITAL_COMMUNITY): Payer: Medicaid Other

## 2015-11-21 MED FILL — LEVOTHYROXINE 175 MCG TAB: 175 | 30 days supply | Qty: 30 | Fill #0

## 2015-11-21 MED FILL — busPIRone HCL 7.5 MG TABS: 7.5 | 30 days supply | Qty: 60 | Fill #0

## 2015-11-21 MED FILL — TRI-LINYAH TABLET: 0.18/0.215/ | 28 days supply | Qty: 28 | Fill #0

## 2015-12-25 ENCOUNTER — Encounter (HOSPITAL_COMMUNITY): Payer: Self-pay | Admitting: Emergency Medicine

## 2015-12-25 ENCOUNTER — Emergency Department (HOSPITAL_COMMUNITY): Payer: Medicaid Other

## 2015-12-25 ENCOUNTER — Emergency Department (HOSPITAL_COMMUNITY)
Admission: EM | Admit: 2015-12-25 | Discharge: 2015-12-25 | Disposition: A | Discharge status: Home / Self Care | Payer: Medicaid Other | Attending: Emergency Medicine | Admitting: Emergency Medicine

## 2015-12-25 DIAGNOSIS — Z91040 Latex allergy status: Secondary | ICD-10-CM | POA: Insufficient documentation

## 2015-12-25 DIAGNOSIS — E039 Hypothyroidism, unspecified: Secondary | ICD-10-CM | POA: Insufficient documentation

## 2015-12-25 DIAGNOSIS — J45909 Unspecified asthma, uncomplicated: Secondary | ICD-10-CM | POA: Diagnosis not present

## 2015-12-25 DIAGNOSIS — N83202 Unspecified ovarian cyst, left side: Secondary | ICD-10-CM | POA: Diagnosis not present

## 2015-12-25 LAB — URINALYSIS, ROUTINE W REFLEX MICROSCOPIC
BILIRUBIN URINE: NEGATIVE
Glucose, UA: NEGATIVE mg/dL
Hgb urine dipstick: NEGATIVE
KETONES UR: NEGATIVE mg/dL
LEUKOCYTES UA: NEGATIVE
NITRITE: NEGATIVE
PH: 5 (ref 5.0–8.0)
PROTEIN: NEGATIVE mg/dL
Specific Gravity, Urine: 1.029 (ref 1.005–1.030)

## 2015-12-25 LAB — CBC WITH DIFFERENTIAL/PLATELET
BASOS ABS: 0 10*3/uL (ref 0.0–0.1)
BASOS PCT: 0 %
EOS ABS: 0.2 10*3/uL (ref 0.0–0.7)
Eosinophils Relative: 2 %
HEMATOCRIT: 33.4 % — AB (ref 36.0–46.0)
HEMOGLOBIN: 9.9 g/dL — AB (ref 12.0–15.0)
Lymphocytes Relative: 30 %
Lymphs Abs: 3.4 10*3/uL (ref 0.7–4.0)
MCH: 22.3 pg — ABNORMAL LOW (ref 26.0–34.0)
MCHC: 29.6 g/dL — ABNORMAL LOW (ref 30.0–36.0)
MCV: 75.4 fL — ABNORMAL LOW (ref 78.0–100.0)
Monocytes Absolute: 0.6 10*3/uL (ref 0.1–1.0)
Monocytes Relative: 6 %
NEUTROS ABS: 7.3 10*3/uL (ref 1.7–7.7)
NEUTROS PCT: 62 %
Platelets: 395 10*3/uL (ref 150–400)
RBC: 4.43 MIL/uL (ref 3.87–5.11)
RDW: 16.2 % — ABNORMAL HIGH (ref 11.5–15.5)
WBC: 11.6 10*3/uL — AB (ref 4.0–10.5)

## 2015-12-25 LAB — BASIC METABOLIC PANEL
ANION GAP: 8 (ref 5–15)
BUN: 8 mg/dL (ref 6–20)
CALCIUM: 9.3 mg/dL (ref 8.9–10.3)
CO2: 24 mmol/L (ref 22–32)
Chloride: 107 mmol/L (ref 101–111)
Creatinine, Ser: 0.49 mg/dL (ref 0.44–1.00)
GFR calc Af Amer: >60 mL/min (ref >60)
GLUCOSE: 85 mg/dL (ref 65–99)
POTASSIUM: 3.8 mmol/L (ref 3.5–5.1)
SODIUM: 139 mmol/L (ref 135–145)

## 2015-12-25 LAB — I-STAT BETA HCG BLOOD, ED (MC, WL, AP ONLY)

## 2015-12-25 MED ORDER — PROCHLORPERAZINE MALEATE 10 MG PO TABS
10.0000 mg | ORAL_TABLET | Freq: Two times a day (BID) | ORAL | 0 refills | Status: DC | PRN
Start: 2015-12-25 — End: 2016-02-11

## 2015-12-25 MED ORDER — TRAMADOL HCL 50 MG PO TABS: 50.0000 mg | ORAL_TABLET | Freq: Four times a day (QID) | ORAL | 0 refills | Status: DC | PRN

## 2015-12-25 MED ORDER — TRAMADOL HCL 50 MG PO TABS
50.0000 mg | ORAL_TABLET | Freq: Once | ORAL | Status: AC
  Administered 2015-12-25: 50 mg via ORAL
  Filled 2015-12-25: qty 1

## 2015-12-25 NOTE — ED Provider Notes (Signed)
[facility] DEPT Provider Note   CSN: [id number] Arrival date & time: 12/25/15  1324     History   Chief Complaint Chief Complaint  Patient presents with  . LLQ pain/ovarian cyst    HPI Kimberly [name] is a 25 y.o. female.  HPI Patient is a 72 old female with past medical history of known left ovarian cyst who presents with left lower quadrant pain. Patient reports she was at work when she experienced sudden onset of left lower quadrant pain around 12:50 PM. The pain was sharp and severe causing her to double over. She denies nausea, vomiting, diarrhea, dysuria, abnormal vaginal bleeding or vaginal discharge. She reports similar pain in the past when her ovarian cyst was hurting. Patient reports she was first diagnosed with a left simple ovarian cyst at age 53. Patient recently delivered a baby 4 months ago and reports that during her pregnancy her cyst was noted to grow. She was supposed to have a follow-up appointment 2 months ago for reevaluation and possible surgical intervention planning but was unable to keep this appointment due to financial difficulties. In addition she reports she has not had any abdominal discomfort or other symptoms for the past 4 months. Patient reports her last menstrual period was 2 weeks ago. She does not think she is pregnant. Denies recent fever, chills or other symptoms.  Past Medical History:  Diagnosis Date  . Anxiety   . Asthma    "grew out of it" (11/16/2012)  . Complication of anesthesia    "takes a lot to put me to sleep; woke up completely mid endoscopy" (11/16/2012)  . Depression   . Dysrhythmia    ST (11/16/2012)  . Gastric reflux   . 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 Iron deficiency anemia   . Migraines    "weekly" (11/16/2012)  . Ovarian cyst   . Schizophrenia ([place])    "moderate" (11/16/2012)  . Scoliosis     Patient Active Problem List   Diagnosis Date Noted  . Ovarian cyst 04/25/2015  . Rubella  non-immune status, antepartum 03/11/2015  . 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 Goiter 03/01/2011  . Acanthosis nigricans, acquired 03/01/2011  . Oligomenorrhea 03/01/2011  . Hirsutism 03/01/2011  . PCOS (polycystic ovarian syndrome) 03/01/2011  . Hypertension 03/01/2011  . GERD (gastroesophageal reflux disease) 03/01/2011  . Anxiety and depression 03/01/2011    Past Surgical History:  Procedure Laterality Date  . UPPER GI ENDOSCOPY  ~ 2006; ~2008    OB History    Gravida Para Term Preterm AB Living   2 1 1   1 1    SAB TAB Ectopic Multiple Live Births   1     0 1       Home Medications    Prior to Admission medications   Medication Sig Start Date End Date Taking? Authorizing Provider  busPIRone (BUSPAR) 7.5 MG tablet Take 1 tablet (7.5 mg total) by mouth 2 (two) times daily. 11/01/15   Arnoldo Morale, MD  FLUoxetine (PROZAC) 20 MG tablet Take 1 tablet (20 mg total) by mouth daily. 08/22/15   Arnoldo Morale, MD  levothyroxine (SYNTHROID, LEVOTHROID) 175 MCG tablet Take 1 tablet (175 mcg total) by mouth daily before breakfast. 11/04/15   Arnoldo Morale, MD  methocarbamol (ROBAXIN) 500 MG tablet Take 1 tablet (500 mg total) by mouth 4 (four) times daily. 11/01/15   Arnoldo Morale, MD  Norgestimate-Ethinyl Estradiol Triphasic (ORTHO TRI-CYCLEN LO) 0.18/0.215/0.25 MG-25 MCG tab  Take 1 tablet by mouth daily. 09/18/15   Chancy Milroy, MD    Family History Family History  Problem Relation Age of Onset  . Diabetes Mother   . Hypertension Mother   . Vision loss Mother   . Hypertension Father   . Hyperlipidemia Father   . Thyroid disease Maternal Grandmother     Social History Social History  Substance Use Topics  . Smoking status: Never Smoker  . Smokeless tobacco: Never Used  . Alcohol use Yes     Comment: very rare     Allergies   Percocet [oxycodone-acetaminophen]; Sulfa antibiotics; Abilify [aripiprazole]; Amoxicillin-pot clavulanate; Cephalexin;  Ferrous sulfate; Keflex [cephalexin]; Latex; Tape; Zicam cold remedy [erysidoron #1]; and Vicodin [hydrocodone-acetaminophen]   Review of Systems Review of Systems  Constitutional: Negative for chills and fever.  HENT: Negative for ear pain and sore throat.   Eyes: Negative for pain and visual disturbance.  Respiratory: Negative for cough and shortness of breath.   Cardiovascular: Negative for chest pain and palpitations.  Gastrointestinal: Positive for abdominal pain. Negative for vomiting.  Genitourinary: Negative for dysuria and hematuria.  Musculoskeletal: Negative for arthralgias and back pain.  Skin: Negative for color change and rash.  Neurological: Negative for seizures and syncope.  All other systems reviewed and are negative.    Physical Exam Updated Vital Signs BP (!) 118/53 (BP Location: Right Arm)   Pulse 88   Temp 98.3 F (36.8 C) (Oral)   Resp 16   SpO2 100%   Physical Exam  Constitutional: She is oriented to person, place, and time. She appears well-developed and well-nourished. No distress.  HENT:  Head: Normocephalic and atraumatic.  Eyes: EOM are normal. Pupils are equal, round, and reactive to light.  Neck: Normal range of motion. Neck supple.  Cardiovascular: Normal rate, regular rhythm and intact distal pulses.   Pulmonary/Chest: Effort normal and breath sounds normal. No respiratory distress.  Abdominal: Soft. She exhibits no distension. There is tenderness (mild TTP over LLQ).  Musculoskeletal: Normal range of motion. She exhibits no edema or tenderness.  Neurological: She is alert and oriented to person, place, and time.  Skin: Skin is warm and dry. No rash noted.  Psychiatric: She has a normal mood and affect.  Nursing note and vitals reviewed.    ED Treatments / Results  Labs (all labs ordered are listed, but only abnormal results are displayed) Labs Reviewed  URINALYSIS, ROUTINE W REFLEX MICROSCOPIC - Abnormal; Notable for the following:         Result Value   APPearance HAZY (*)    All other components within normal limits  CBC WITH DIFFERENTIAL/PLATELET - Abnormal; Notable for the following:    WBC 11.6 (*)    Hemoglobin 9.9 (*)    HCT 33.4 (*)    MCV 75.4 (*)    MCH 22.3 (*)    MCHC 29.6 (*)    RDW 16.2 (*)    All other components within normal limits  BASIC METABOLIC PANEL  I-STAT BETA HCG BLOOD, ED ([facility], [facility], [facility] ONLY)    EKG  EKG Interpretation None       Radiology US Transvaginal Non-ob  Result Date: 12/25/2015 CLINICAL DATA:  Left ovarian cyst.  Left lower quadrant pain. EXAM: TRANSABDOMINAL AND TRANSVAGINAL ULTRASOUND OF PELVIS DOPPLER ULTRASOUND OF OVARIES TECHNIQUE: Both transabdominal and transvaginal ultrasound examinations of the pelvis were performed. Transabdominal technique was performed for global imaging of the pelvis including uterus, ovaries, adnexal regions, and pelvic cul-de-sac. It  was necessary to proceed with endovaginal exam following the transabdominal exam to visualize the ovaries and endometrium. Color and duplex Doppler ultrasound was utilized to evaluate blood flow to the ovaries. COMPARISON:  None. FINDINGS: Uterus Measurements: 7.2 x 4.5 x 5.3 cm. No fibroids or other mass visualized. Endometrium Thickness: 5.6 mm.  No focal abnormality visualized. Right ovary Measurements: 4.6 x 3.5 x 2.9 cm. 2.3 x 2.1 x 1.9 cm conglomeration of multiple follicles. Otherwise normal appearance. No adnexal mass. Left ovary Measurements: 14.8 x 9.9 x 11.5 cm. 13.8 x 8.9 x 10.5 cm large anechoic left ovarian mass incompletely characterized given the large size. Pulsed Doppler evaluation of both ovaries demonstrates normal low-resistance arterial and venous waveforms. Other findings Small amount of pelvic free fluid. IMPRESSION: 1. Large left bearing cystic mass measuring 13.8 x 8.9 x 10.5 cm. Since these may be difficult to assess completely with [place], further evaluation of simple-appearing cysts >7 cm with MRI or  surgical evaluation is recommended according to the Society of Radiologists in Ultrasound 2010 Consensus Conference Statement (D Clovis Riley et al. Management of Asymptomatic Ovarian and other Adnexal Cysts Imaged at [place]: Society of Radiologists in [place] Statement 2010. Radiology 256 (Sept 2010): 943-954.). 2. No ovarian torsion. Electronically Signed   By: Kathreen Devoid   On: 12/25/2015 16:33   US Pelvis Complete  Result Date: 12/25/2015 CLINICAL DATA:  Left ovarian cyst.  Left lower quadrant pain. EXAM: TRANSABDOMINAL AND TRANSVAGINAL ULTRASOUND OF PELVIS DOPPLER ULTRASOUND OF OVARIES TECHNIQUE: Both transabdominal and transvaginal ultrasound examinations of the pelvis were performed. Transabdominal technique was performed for global imaging of the pelvis including uterus, ovaries, adnexal regions, and pelvic cul-de-sac. It was necessary to proceed with endovaginal exam following the transabdominal exam to visualize the ovaries and endometrium. Color and duplex Doppler ultrasound was utilized to evaluate blood flow to the ovaries. COMPARISON:  None. FINDINGS: Uterus Measurements: 7.2 x 4.5 x 5.3 cm. No fibroids or other mass visualized. Endometrium Thickness: 5.6 mm.  No focal abnormality visualized. Right ovary Measurements: 4.6 x 3.5 x 2.9 cm. 2.3 x 2.1 x 1.9 cm conglomeration of multiple follicles. Otherwise normal appearance. No adnexal mass. Left ovary Measurements: 14.8 x 9.9 x 11.5 cm. 13.8 x 8.9 x 10.5 cm large anechoic left ovarian mass incompletely characterized given the large size. Pulsed Doppler evaluation of both ovaries demonstrates normal low-resistance arterial and venous waveforms. Other findings Small amount of pelvic free fluid. IMPRESSION: 1. Large left bearing cystic mass measuring 13.8 x 8.9 x 10.5 cm. Since these may be difficult to assess completely with [place], further evaluation of simple-appearing cysts >7 cm with MRI or surgical evaluation is recommended according to  the Society of Radiologists in Ultrasound 2010 Consensus Conference Statement (D Clovis Riley et al. Management of Asymptomatic Ovarian and other Adnexal Cysts Imaged at [place]: Society of Radiologists in [place] Statement 2010. Radiology 256 (Sept 2010): 943-954.). 2. No ovarian torsion. Electronically Signed   By: Kathreen Devoid   On: 12/25/2015 16:33   [place] Art/ven Flow Abd Pelv Doppler  Result Date: 12/25/2015 CLINICAL DATA:  Left ovarian cyst.  Left lower quadrant pain. EXAM: TRANSABDOMINAL AND TRANSVAGINAL ULTRASOUND OF PELVIS DOPPLER ULTRASOUND OF OVARIES TECHNIQUE: Both transabdominal and transvaginal ultrasound examinations of the pelvis were performed. Transabdominal technique was performed for global imaging of the pelvis including uterus, ovaries, adnexal regions, and pelvic cul-de-sac. It was necessary to proceed with endovaginal exam following the transabdominal exam to visualize the ovaries and endometrium. Color and  duplex Doppler ultrasound was utilized to evaluate blood flow to the ovaries. COMPARISON:  None. FINDINGS: Uterus Measurements: 7.2 x 4.5 x 5.3 cm. No fibroids or other mass visualized. Endometrium Thickness: 5.6 mm.  No focal abnormality visualized. Right ovary Measurements: 4.6 x 3.5 x 2.9 cm. 2.3 x 2.1 x 1.9 cm conglomeration of multiple follicles. Otherwise normal appearance. No adnexal mass. Left ovary Measurements: 14.8 x 9.9 x 11.5 cm. 13.8 x 8.9 x 10.5 cm large anechoic left ovarian mass incompletely characterized given the large size. Pulsed Doppler evaluation of both ovaries demonstrates normal low-resistance arterial and venous waveforms. Other findings Small amount of pelvic free fluid. IMPRESSION: 1. Large left bearing cystic mass measuring 13.8 x 8.9 x 10.5 cm. Since these may be difficult to assess completely with [place], further evaluation of simple-appearing cysts >7 cm with MRI or surgical evaluation is recommended according to the Society of Radiologists in  Ultrasound 2010 Consensus Conference Statement (D Clovis Riley et al. Management of Asymptomatic Ovarian and other Adnexal Cysts Imaged at [place]: Society of Radiologists in [place] Statement 2010. Radiology 256 (Sept 2010): 943-954.). 2. No ovarian torsion. Electronically Signed   By: Kathreen Devoid   On: 12/25/2015 16:33    Procedures Procedures (including critical care time)  Medications Ordered in ED Medications - No data to display   Initial Impression / Assessment and Plan / ED Course  I have reviewed the triage vital signs and the nursing notes.  Pertinent labs & imaging results that were available during my care of the patient were reviewed by me and considered in my medical decision making (see chart for details).  Clinical Course    Patient is a 25 year old female possible history as above who presents with left lower quadrant pain. Afebrile, normotensive, VSS on arrival. Exam with mild tenderness to palpation about the left lower quadrant. Patient reports her pain has improved while in the emergency department. Labs obtained in triage reviewed. Significant for mild leukocytosis, anemia that is improved compared to prior. No significant left flank abnormalities. Pregnancy test negative. UA negative for signs of stone or infection. Abdominal and transvaginal ultrasound obtained. Significant for large left ovarian cyst as noted above, which is likely the cause of patient's symptoms. No torsion. She was discussed with OB/Gyn on call, Dr. Glo Herring. Vision is stable, without signs of torsion or significant lab abnormalities, recommend outpatient follow-up for further management.  Patient was discharged in stable condition. Prescription for small amount of tramadol given. Patient given first dose in the ED and monitored without signs of allergic reaction. Antiemetics prescription given. Advised to call OB/GYN today or tomorrow to schedule follow-up as directed. Strict return  precautions were discussed. Patient reports understanding and agreement with plan. He and discussed with Dr. Sherry Ruffing, ED attending   Final Clinical Impressions(s) / ED Diagnoses   Final diagnoses:  Cyst of left ovary    New Prescriptions Discharge Medication List as of 12/25/2015  5:37 PM    START taking these medications   Details  prochlorperazine (COMPAZINE) 10 MG tablet Take 1 tablet (10 mg total) by mouth 2 (two) times daily as needed for nausea or vomiting., Starting Wed 12/25/2015, Print    traMADol (ULTRAM) 50 MG tablet Take 1 tablet (50 mg total) by mouth every 6 (six) hours as needed., Starting Wed 12/25/2015, Print         Gibson Ramp, MD 12/26/15 [place], MD 12/26/15 2159

## 2015-12-25 NOTE — ED Triage Notes (Signed)
Patient complains of 45 minutes of LLQ pain. States that she thinks ovarian cyst, hx of same. Just finished menstrual cycle 2 weeks ago

## 2015-12-25 NOTE — Discharge Instructions (Signed)
If you experience severely worsening pain, vomiting, or any other new or worsening symptoms return to the emergency department for evaluation.  If you experience any throat swelling, rashes, or shortness of breath while taking your pain medicine, stop taking the medicine immediately and go to the emergency room for evaluation.

## 2016-01-16 ENCOUNTER — Telehealth: Payer: Self-pay | Admitting: *Deleted

## 2016-01-16 ENCOUNTER — Encounter: Payer: Self-pay | Admitting: Obstetrics & Gynecology

## 2016-01-16 ENCOUNTER — Other Ambulatory Visit: Payer: Self-pay

## 2016-01-16 ENCOUNTER — Ambulatory Visit (INDEPENDENT_AMBULATORY_CARE_PROVIDER_SITE_OTHER): Payer: Self-pay | Admitting: Obstetrics & Gynecology

## 2016-01-16 ENCOUNTER — Encounter: Payer: Self-pay | Admitting: Family Medicine

## 2016-01-16 VITALS — BP 115/68 | HR 87 | Wt 242.1 lb

## 2016-01-16 DIAGNOSIS — D649 Anemia, unspecified: Secondary | ICD-10-CM | POA: Insufficient documentation

## 2016-01-16 DIAGNOSIS — N83202 Unspecified ovarian cyst, left side: Secondary | ICD-10-CM

## 2016-01-16 DIAGNOSIS — E071 Dyshormogenetic goiter: Secondary | ICD-10-CM

## 2016-01-16 LAB — T4, FREE: Free T4: 0.9 ng/dL (ref 0.8–1.8)

## 2016-01-16 LAB — T3, FREE: T3, Free: 3.1 pg/mL (ref 2.3–4.2)

## 2016-01-16 NOTE — Progress Notes (Signed)
   Subjective:    Patient ID: Kimberly [name], female    DOB: Jun 20, 1990, 25 y.o.   MRN: [id number]  HPI 25 yo SWP11 (34 month old son) here for ER follow up. She was seen there 12-25-15 for LLQ pain. A 14x 10 cm simple cyst was seen.    Review of Systems She uses condoms for contraception, doesn't want a pregnancy again soon. Pap utd and normal    Objective:   Physical Exam  Obese pleasant WF benign      Assessment & Plan:  Symptomatic large left ovarian cyst- offered laparoscopy Check CA-125 email sent to [place] to schedule this asap Discussed that she can get birth control without medicaid at the health dept She will fill out financial aid forms

## 2016-01-16 NOTE — Telephone Encounter (Signed)
Patient left without getting labs draw, Called her and asked if she can come back. Dr Hulan Fray spoke to her as well. Patient stated she will come back today.

## 2016-01-16 NOTE — Addendum Note (Signed)
Addended by: Emily Filbert on: 01/16/2016 08:29 AM   Modules accepted: Orders

## 2016-01-17 LAB — CA 125: CA 125: 21 U/mL (ref <35)

## 2016-01-22 ENCOUNTER — Encounter (HOSPITAL_COMMUNITY): Payer: Self-pay | Admitting: *Deleted

## 2016-01-27 NOTE — Patient Instructions (Addendum)
Your procedure is scheduled on:  Tuesday, Jan. 23, 2018  Enter through the [employer] [facility] at:  9:15 AM  Pick up the phone at the desk and dial [phone].  Call this number if you have problems the morning of surgery: [phone].  Remember: Do NOT eat food or drink after:  Midnight Monday, Jan. 22, 2018  Take these medicines the morning of surgery with a SIP OF WATER:  Levothyroxine  Stop ALL herbal medications at this time  Do NOT smoke the day of surgery.  Do NOT wear jewelry (body piercing), metal hair clips/bobby pins, make-up, or nail polish. Do NOT wear lotions, powders, or perfumes.  You may wear deodorant. Do NOT shave for 48 hours prior to surgery. Do NOT bring valuables to the hospital. Contacts, dentures, or bridgework may not be worn into surgery.  Have a responsible adult drive you home and stay with you for 24 hours after your procedure  Bring a copy of your healthcare power of attorney and living will documents.  DISCHARGE INSTRUCTIONS: GYN LAPAROSCOPY The following instructions have been prepared to help you care for yourself upon your return home today. Wound care: Marland Kitchen Do not get the incision wet for the first 24 hours. The incision should be kept clean and dry. . The Band-Aids or dressings may be removed the day after surgery. . Should the incision become sore, red, and swollen after the first week, check with your doctor. Personal hygiene: Marland Kitchen Use sanitary pads, not tampons. . Shower the day after your procedure. . NO tub baths, pools, or Jacuzzis for 2-3 weeks. . Wipe front to back after using the bathroom. Activity and limitations: . Do NOT drive or operate any equipment today. . Do NOT lift anything more than 15 pounds for 2-3 weeks after surgery. . Do NOT rest in bed all day. . Walking is encouraged. Walk each day, starting slowly with 5-minute walks 3 or 4 times a day. Slowly increase the length of your walks. . Walk up and down stairs  slowly. . Do NOT do strenuous activities, such as golfing, playing tennis, bowling, running, biking, weight lifting, gardening, mowing, or vacuuming for 2-4 weeks. Ask your doctor when it is okay to start. Sexual activity: NO intercourse for at least 2 weeks after the procedure. If your laparoscopy was for sterilization (tubes tied), continue current method of birth control until after your next period or ask for specific instructions from your doctor. Diet: Eat a light meal as desired this evening. You may resume your usual diet tomorrow. Return to work: This is dependent on the type of work you do. For the most part you can return to a desk job within a week of surgery. If you are more active at work, please discuss this with your doctor. What to expect after your surgery: You may have a slight burning sensation when you urinate on the first day. You may have a very small amount of blood in the urine. Expect to have a small amount of vaginal discharge/light bleeding for 1-2 weeks. It is not unusual to have abdominal soreness and bruising for up to 2 weeks. You may be tired and need more rest for about 1 week. You may experience shoulder pain for 24-72 hours. Lying flat in bed may relieve it. Call your doctor for any of the following: . Excessive vaginal bleeding, saturating a pad in less than 2-3 hours . Develop a fever of 100.4 or greater . Inability to urinate  6 hours after discharge from hospital . Unusual vaginal discharge or odor . Severe pain not relieved by pain medications . Persistent of heavy bleeding at incision site . Redness or swelling around incision site after a week . Increasing nausea or vomiting Return to office ___________________________ Call for an appointment ____________________ Additional information: _______________________________________________________________ ___________________________________________________________________________________ Physician's  signature: _____________________ Patient's signature: ________________________ ________________________________________ Nurse's signature _________________________ [facility] [phone]

## 2016-01-28 ENCOUNTER — Encounter (HOSPITAL_COMMUNITY)
Admission: RE | Admit: 2016-01-28 | Discharge: 2016-01-28 | Disposition: A | Discharge status: Home / Self Care | Payer: Medicaid Other | Source: Ambulatory Visit | Attending: Obstetrics & Gynecology | Admitting: Obstetrics & Gynecology

## 2016-01-28 ENCOUNTER — Encounter (HOSPITAL_COMMUNITY): Payer: Self-pay

## 2016-01-28 DIAGNOSIS — Z01812 Encounter for preprocedural laboratory examination: Secondary | ICD-10-CM | POA: Insufficient documentation

## 2016-01-28 HISTORY — DX: Gestational (pregnancy-induced) hypertension without significant proteinuria, unspecified trimester: O13.9

## 2016-01-28 HISTORY — DX: Dorsalgia, unspecified: M54.9

## 2016-01-28 HISTORY — DX: Other chronic pain: G89.29

## 2016-01-28 LAB — CBC
HCT: 33.6 % — ABNORMAL LOW (ref 36.0–46.0)
Hemoglobin: 10.1 g/dL — ABNORMAL LOW (ref 12.0–15.0)
MCH: 23.1 pg — AB (ref 26.0–34.0)
MCHC: 30.1 g/dL (ref 30.0–36.0)
MCV: 76.7 fL — AB (ref 78.0–100.0)
Platelets: 351 10*3/uL (ref 150–400)
RBC: 4.38 MIL/uL (ref 3.87–5.11)
RDW: 16 % — ABNORMAL HIGH (ref 11.5–15.5)
WBC: 9.4 10*3/uL (ref 4.0–10.5)

## 2016-02-11 ENCOUNTER — Ambulatory Visit (HOSPITAL_COMMUNITY): Payer: Medicaid Other | Admitting: Anesthesiology

## 2016-02-11 ENCOUNTER — Encounter (HOSPITAL_COMMUNITY): Payer: Self-pay

## 2016-02-11 ENCOUNTER — Ambulatory Visit (HOSPITAL_COMMUNITY)
Admission: RE | Admit: 2016-02-11 | Discharge: 2016-02-11 | Disposition: A | Discharge status: Home / Self Care | Payer: Medicaid Other | Source: Ambulatory Visit | Attending: Obstetrics & Gynecology | Admitting: Obstetrics & Gynecology

## 2016-02-11 ENCOUNTER — Encounter (HOSPITAL_COMMUNITY)
Admission: RE | Disposition: A | Discharge status: Home / Self Care | Payer: Self-pay | Source: Ambulatory Visit | Attending: Obstetrics & Gynecology

## 2016-02-11 DIAGNOSIS — I10 Essential (primary) hypertension: Secondary | ICD-10-CM | POA: Insufficient documentation

## 2016-02-11 DIAGNOSIS — E038 Other specified hypothyroidism: Secondary | ICD-10-CM | POA: Insufficient documentation

## 2016-02-11 DIAGNOSIS — D271 Benign neoplasm of left ovary: Secondary | ICD-10-CM | POA: Insufficient documentation

## 2016-02-11 DIAGNOSIS — Z6841 Body Mass Index (BMI) 40.0 and over, adult: Secondary | ICD-10-CM | POA: Diagnosis not present

## 2016-02-11 DIAGNOSIS — N83292 Other ovarian cyst, left side: Secondary | ICD-10-CM

## 2016-02-11 DIAGNOSIS — N83202 Unspecified ovarian cyst, left side: Secondary | ICD-10-CM | POA: Diagnosis present

## 2016-02-11 HISTORY — PX: LAPAROSCOPIC OVARIAN CYSTECTOMY: SHX6248

## 2016-02-11 LAB — PREGNANCY, URINE: Preg Test, Ur: NEGATIVE

## 2016-02-11 SURGERY — EXCISION, CYST, OVARY, LAPAROSCOPIC
Anesthesia: General | Site: Abdomen | Laterality: Left

## 2016-02-11 MED ORDER — ONDANSETRON HCL 4 MG/2ML IJ SOLN
INTRAMUSCULAR | Status: AC
Start: 2016-02-11 — End: 2016-02-11
  Filled 2016-02-11: qty 2

## 2016-02-11 MED ORDER — KETOROLAC TROMETHAMINE 30 MG/ML IJ SOLN
INTRAMUSCULAR | Status: AC
  Filled 2016-02-11: qty 1

## 2016-02-11 MED ORDER — SUGAMMADEX SODIUM 200 MG/2ML IV SOLN
INTRAVENOUS | Status: AC
  Filled 2016-02-11: qty 2

## 2016-02-11 MED ORDER — LIDOCAINE HCL (CARDIAC) 20 MG/ML IV SOLN
INTRAVENOUS | Status: DC | PRN
  Administered 2016-02-11: 100 mg via INTRAVENOUS

## 2016-02-11 MED ORDER — MIDAZOLAM HCL 2 MG/2ML IJ SOLN
INTRAMUSCULAR | Status: AC
Start: 2016-02-11 — End: 2016-02-11
  Filled 2016-02-11: qty 2

## 2016-02-11 MED ORDER — HYDROMORPHONE HCL 1 MG/ML IJ SOLN
INTRAMUSCULAR | Status: DC | PRN
  Administered 2016-02-11: 0.5 mg via INTRAVENOUS

## 2016-02-11 MED ORDER — ONDANSETRON HCL 4 MG/2ML IJ SOLN
INTRAMUSCULAR | Status: DC | PRN
  Administered 2016-02-11: 4 mg via INTRAVENOUS

## 2016-02-11 MED ORDER — FENTANYL CITRATE (PF) 250 MCG/5ML IJ SOLN
INTRAMUSCULAR | Status: AC
  Filled 2016-02-11: qty 5

## 2016-02-11 MED ORDER — LIDOCAINE HCL (CARDIAC) 20 MG/ML IV SOLN
INTRAVENOUS | Status: AC
  Filled 2016-02-11: qty 5

## 2016-02-11 MED ORDER — OXYCODONE-ACETAMINOPHEN 5-325 MG PO TABS: 1.0000 | ORAL_TABLET | Freq: Four times a day (QID) | ORAL | 0 refills | Status: DC | PRN

## 2016-02-11 MED ORDER — BUPIVACAINE HCL (PF) 0.5 % IJ SOLN
INTRAMUSCULAR | Status: DC | PRN
  Administered 2016-02-11: 20 mL

## 2016-02-11 MED ORDER — SCOPOLAMINE 1 MG/3DAYS TD PT72
1.0000 | MEDICATED_PATCH | Freq: Once | TRANSDERMAL | Status: DC
  Administered 2016-02-11: 1.5 mg via TRANSDERMAL

## 2016-02-11 MED ORDER — IBUPROFEN 600 MG PO TABS: 600.0000 mg | ORAL_TABLET | Freq: Four times a day (QID) | ORAL | 1 refills | Status: DC | PRN

## 2016-02-11 MED ORDER — KETOROLAC TROMETHAMINE 30 MG/ML IJ SOLN
INTRAMUSCULAR | Status: DC | PRN
  Administered 2016-02-11: 30 mg via INTRAVENOUS

## 2016-02-11 MED ORDER — ACETAMINOPHEN 160 MG/5ML PO SOLN
ORAL | Status: AC
  Administered 2016-02-11: 975 mg via ORAL
  Filled 2016-02-11: qty 40.6

## 2016-02-11 MED ORDER — MIDAZOLAM HCL 2 MG/2ML IJ SOLN
INTRAMUSCULAR | Status: DC | PRN
  Administered 2016-02-11: 2 mg via INTRAVENOUS

## 2016-02-11 MED ORDER — FENTANYL CITRATE (PF) 100 MCG/2ML IJ SOLN
25.0000 ug | INTRAMUSCULAR | Status: DC | PRN
  Administered 2016-02-11: 50 ug via INTRAVENOUS

## 2016-02-11 MED ORDER — DEXAMETHASONE SODIUM PHOSPHATE 4 MG/ML IJ SOLN
INTRAMUSCULAR | Status: AC
  Filled 2016-02-11: qty 1

## 2016-02-11 MED ORDER — DEXAMETHASONE SODIUM PHOSPHATE 10 MG/ML IJ SOLN
INTRAMUSCULAR | Status: DC | PRN
  Administered 2016-02-11: 4 mg via INTRAVENOUS

## 2016-02-11 MED ORDER — PROPOFOL 10 MG/ML IV BOLUS
INTRAVENOUS | Status: DC | PRN
  Administered 2016-02-11: 200 mg via INTRAVENOUS

## 2016-02-11 MED ORDER — SUGAMMADEX SODIUM 200 MG/2ML IV SOLN: INTRAVENOUS | Status: DC | PRN

## 2016-02-11 MED ORDER — SCOPOLAMINE 1 MG/3DAYS TD PT72
MEDICATED_PATCH | TRANSDERMAL | Status: AC
  Administered 2016-02-11: 1.5 mg via TRANSDERMAL
  Filled 2016-02-11: qty 1

## 2016-02-11 MED ORDER — BUPIVACAINE HCL (PF) 0.5 % IJ SOLN
INTRAMUSCULAR | Status: AC
  Filled 2016-02-11: qty 30

## 2016-02-11 MED ORDER — ROCURONIUM BROMIDE 100 MG/10ML IV SOLN
INTRAVENOUS | Status: AC
  Filled 2016-02-11: qty 1

## 2016-02-11 MED ORDER — HYDROMORPHONE HCL 1 MG/ML IJ SOLN
INTRAMUSCULAR | Status: AC
  Filled 2016-02-11: qty 1

## 2016-02-11 MED ORDER — SUGAMMADEX SODIUM 200 MG/2ML IV SOLN
INTRAVENOUS | Status: DC | PRN
  Administered 2016-02-11: 83 mg via INTRAVENOUS

## 2016-02-11 MED ORDER — ROCURONIUM BROMIDE 100 MG/10ML IV SOLN
INTRAVENOUS | Status: DC | PRN
  Administered 2016-02-11: 40 mg via INTRAVENOUS

## 2016-02-11 MED ORDER — FENTANYL CITRATE (PF) 100 MCG/2ML IJ SOLN
INTRAMUSCULAR | Status: DC | PRN
  Administered 2016-02-11 (×2): 100 ug via INTRAVENOUS

## 2016-02-11 MED ORDER — LACTATED RINGERS IV SOLN
INTRAVENOUS | Status: DC
  Administered 2016-02-11 (×2): via INTRAVENOUS

## 2016-02-11 MED ORDER — LACTATED RINGERS IR SOLN
Status: DC | PRN
Start: 2016-02-11 — End: 2016-02-11
  Administered 2016-02-11: 3000 mL

## 2016-02-11 MED ORDER — FENTANYL CITRATE (PF) 100 MCG/2ML IJ SOLN
INTRAMUSCULAR | Status: AC
  Administered 2016-02-11: 50 ug via INTRAVENOUS
  Filled 2016-02-11: qty 2

## 2016-02-11 MED ORDER — ACETAMINOPHEN 160 MG/5ML PO SOLN
975.0000 mg | Freq: Once | ORAL | Status: AC
  Administered 2016-02-11: 975 mg via ORAL

## 2016-02-11 MED ORDER — PROPOFOL 10 MG/ML IV BOLUS
INTRAVENOUS | Status: AC
  Filled 2016-02-11: qty 20

## 2016-02-11 MED FILL — OXYCODONE W/APAP 5/325 TAB: 5-325 | 4 days supply | Qty: 30 | Fill #0

## 2016-02-11 SURGICAL SUPPLY — 39 items
APPLICATOR COTTON TIP 6IN STRL (MISCELLANEOUS) ×1 IMPLANT
BAG SPEC RTRVL LRG 6X4 10 (ENDOMECHANICALS) ×1
CABLE HIGH FREQUENCY MONO STRZ (ELECTRODE) IMPLANT
CATH ROBINSON RED A/P 16FR (CATHETERS) ×3 IMPLANT
CLOSURE WOUND 1/2 X4 (GAUZE/BANDAGES/DRESSINGS) ×1
CLOTH BEACON ORANGE TIMEOUT ST (SAFETY) ×3 IMPLANT
DRSG OPSITE POSTOP 3X4 (GAUZE/BANDAGES/DRESSINGS) IMPLANT
DURAPREP 26ML APPLICATOR (WOUND CARE) ×3 IMPLANT
ELECT REM PT RETURN 9FT ADLT (ELECTROSURGICAL)
ELECTRODE REM PT RTRN 9FT ADLT (ELECTROSURGICAL) IMPLANT
FORCEPS CUTTING 33CM 5MM (CUTTING FORCEPS) IMPLANT
FORCEPS CUTTING 45CM 5MM (CUTTING FORCEPS) IMPLANT
GLOVE BIO SURGEON STRL SZ 6.5 (GLOVE) ×2 IMPLANT
GLOVE BIO SURGEONS STRL SZ 6.5 (GLOVE) ×1
GLOVE BIOGEL PI IND STRL 7.0 (GLOVE) ×1 IMPLANT
GLOVE BIOGEL PI INDICATOR 7.0 (GLOVE) ×2
GOWN STRL REUS W/TWL LRG LVL3 (GOWN DISPOSABLE) ×6 IMPLANT
NDL SAFETY ECLIPSE 18X1.5 (NEEDLE) ×1 IMPLANT
NEEDLE HYPO 18GX1.5 SHARP (NEEDLE) ×3
NEEDLE INSUFFLATION 120MM (ENDOMECHANICALS) ×3 IMPLANT
NS IRRIG 1000ML POUR BTL (IV SOLUTION) ×3 IMPLANT
PACK LAPAROSCOPY BASIN (CUSTOM PROCEDURE TRAY) ×3 IMPLANT
PACK TRENDGUARD 450 HYBRID PRO (MISCELLANEOUS) IMPLANT
PACK TRENDGUARD 600 HYBRD PROC (MISCELLANEOUS) IMPLANT
POUCH SPECIMEN RETRIEVAL 10MM (ENDOMECHANICALS) ×2 IMPLANT
PROTECTOR NERVE ULNAR (MISCELLANEOUS) ×6 IMPLANT
SET IRRIG TUBING LAPAROSCOPIC (IRRIGATION / IRRIGATOR) ×2 IMPLANT
SHEARS HARMONIC ACE PLUS 36CM (ENDOMECHANICALS) ×2 IMPLANT
SLEEVE XCEL OPT CAN 5 100 (ENDOMECHANICALS) ×3 IMPLANT
STRIP CLOSURE SKIN 1/2X4 (GAUZE/BANDAGES/DRESSINGS) ×2 IMPLANT
SUT VICRYL 0 UR6 27IN ABS (SUTURE) ×3 IMPLANT
SUT VICRYL 4-0 PS2 18IN ABS (SUTURE) ×6 IMPLANT
TOWEL OR 17X24 6PK STRL BLUE (TOWEL DISPOSABLE) ×6 IMPLANT
TRENDGUARD 450 HYBRID PRO PACK (MISCELLANEOUS)
TRENDGUARD 600 HYBRID PROC PK (MISCELLANEOUS) ×3
TROCAR OPTI TIP 5M 100M (ENDOMECHANICALS) ×3 IMPLANT
TROCAR XCEL DIL TIP R 11M (ENDOMECHANICALS) ×3 IMPLANT
WARMER LAPAROSCOPE (MISCELLANEOUS) ×3 IMPLANT
WATER STERILE IRR 1000ML POUR (IV SOLUTION) ×1 IMPLANT

## 2016-02-11 NOTE — Anesthesia Preprocedure Evaluation (Addendum)
Anesthesia Evaluation  Patient identified by MRN, date of birth, ID band Patient awake    Reviewed: Allergy & Precautions, H&P , Patient's Chart, lab work & pertinent test results, reviewed documented beta blocker date and time   Airway Mallampati: III  TM Distance: >3 FB Neck ROM: full    Dental no notable dental hx.    Pulmonary    Pulmonary exam normal breath sounds clear to auscultation       Cardiovascular hypertension,  Rhythm:regular Rate:Normal     Neuro/Psych    GI/Hepatic   Endo/Other  Morbid obesity  Renal/GU      Musculoskeletal   Abdominal   Peds  Hematology   Anesthesia Other Findings Pt takes tylenol at home  Reproductive/Obstetrics                            Anesthesia Physical Anesthesia Plan  ASA: III  Anesthesia Plan: General   Post-op Pain Management:    Induction: Intravenous  Airway Management Planned: Oral ETT  Additional Equipment:   Intra-op Plan:   Post-operative Plan: Extubation in OR  Informed Consent: I have reviewed the patients History and Physical, chart, labs and discussed the procedure including the risks, benefits and alternatives for the proposed anesthesia with the patient or authorized representative who has indicated his/her understanding and acceptance.   Dental Advisory Given and Dental advisory given  Plan Discussed with: CRNA and Surgeon  Anesthesia Plan Comments: (  Discussed general anesthesia, including possible nausea, instrumentation of airway, sore throat,pulmonary aspiration, etc. I asked if the were any outstanding questions, or  concerns before we proceeded.)        Anesthesia Quick Evaluation

## 2016-02-11 NOTE — H&P (Signed)
Kimberly [name] is an.26 yo SWP33 (49 month old son) here for removal of her ovarian cyst. She was seen there 12-25-15 for LLQ pain. A 14x 10 cm simple cyst was seen.   Menstrual History: Menarche age: 26 Patient's last menstrual period was 02/10/2016 (exact date).    Past Medical History:  Diagnosis Date  . Anxiety   . Asthma    "grew out of it" (11/16/2012)  . Chronic back pain   . Complication of anesthesia    "takes a lot to put me to sleep; woke up completely mid endoscopy" (11/16/2012)  . Depression   . Dysrhythmia    ST (11/16/2012)  . Gastric reflux   . 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 Iron deficiency anemia   . Migraines    "weekly" (11/16/2012)  . Ovarian cyst   . Pregnancy induced hypertension   . Schizophrenia ([place])    "moderate" (11/16/2012)  . Scoliosis     Past Surgical History:  Procedure Laterality Date  . UPPER GI ENDOSCOPY  ~ 2006; ~2008    Family History  Problem Relation Age of Onset  . Diabetes Mother   . Hypertension Mother   . Vision loss Mother   . Hypertension Father   . Hyperlipidemia Father   . Thyroid disease Maternal Grandmother     Social History:  reports that she has never smoked. She has never used smokeless tobacco. She reports that she drinks alcohol. She reports that she does not use drugs.  Allergies:  Allergies  Allergen Reactions  . Percocet [Oxycodone-Acetaminophen] Anaphylaxis  . Sulfa Antibiotics Swelling  . Abilify [Aripiprazole]     Lethargy, unable to function  . Amoxicillin-Pot Clavulanate Other (See Comments)    Stomach pains Has patient had a PCN reaction causing immediate rash, facial/tongue/throat swelling, SOB or lightheadedness with hypotension: No Has patient had a PCN reaction causing severe rash involving mucus membranes or skin necrosis: No Has patient had a PCN reaction that required hospitalization No Has patient had a PCN reaction occurring within the last 10 years: Yes If all  of the above answers are "NO", then may proceed with Cephalosporin use.   . Cephalexin Other (See Comments)    Stomach pains Pt taking Cefadroxil as outpatient  . Ferrous Sulfate Nausea And Vomiting  . Keflex [Cephalexin]     Rash--able to take w/ Benadryl  . Latex     Swelling, burning of skin .   Marland Kitchen Tape     Swelling, burning of skin .   Marland Kitchen Zicam Cold Remedy [Erysidoron #1] Nausea And Vomiting  . Vicodin [Hydrocodone-Acetaminophen] Hives and Rash    Prescriptions Prior to Admission  Medication Sig Dispense Refill Last Dose  . acetaminophen (TYLENOL) 500 MG tablet Take 1,000 mg by mouth every 6 (six) hours as needed for headache.   02/10/2016 at 1500  . levothyroxine (SYNTHROID, LEVOTHROID) 175 MCG tablet Take 1 tablet (175 mcg total) by mouth daily before breakfast. 30 tablet 2 02/10/2016 at Unknown time  . senna (SENOKOT) 8.6 MG TABS tablet Take 1 tablet by mouth daily as needed for mild constipation.   Past Month at Unknown time  . traMADol (ULTRAM) 50 MG tablet Take 1 tablet (50 mg total) by mouth every 6 (six) hours as needed. (Patient taking differently: Take 50 mg by mouth every 6 (six) hours as needed for moderate pain. ) 6 tablet 0 Past Month at Unknown time  . busPIRone (BUSPAR) 7.5 MG tablet Take 1 tablet (  7.5 mg total) by mouth 2 (two) times daily. (Patient not taking: Reported on 01/16/2016) 60 tablet 2 Not Taking  . FLUoxetine (PROZAC) 20 MG tablet Take 1 tablet (20 mg total) by mouth daily. (Patient not taking: Reported on 01/16/2016) 30 tablet 3 Not Taking  . methocarbamol (ROBAXIN) 500 MG tablet Take 1 tablet (500 mg total) by mouth 4 (four) times daily. (Patient not taking: Reported on 01/16/2016) 60 tablet 2 Not Taking  . Norgestimate-Ethinyl Estradiol Triphasic (ORTHO TRI-CYCLEN LO) 0.18/0.215/0.25 MG-25 MCG tab Take 1 tablet by mouth daily. (Patient not taking: Reported on 01/16/2016) 1 Package 11 Not Taking  . prochlorperazine (COMPAZINE) 10 MG tablet Take 1 tablet (10 mg  total) by mouth 2 (two) times daily as needed for nausea or vomiting. (Patient not taking: Reported on 01/16/2016) 6 tablet 0 Not Taking    ROS  Works as a Camera operator She is abstinent for about a month, considering IUD for the future.  Blood pressure 121/79, pulse 82, temperature 98 F (36.7 C), temperature source Oral, resp. rate 18, last menstrual period 02/10/2016, SpO2 99 %, not currently breastfeeding. Physical Exam  Heart- rrr Lungs- CTAB Abd- benign  Results for orders placed or performed during the hospital encounter of 02/11/16 (from the past 24 hour(s))  Pregnancy, urine     Status: None   Collection Time: 02/11/16  7:30 AM  Result Value Ref Range   Preg Test, Ur NEGATIVE NEGATIVE    No results found.  Assessment/Plan: Painful large cyst- plan for its removal.  She understands the risks of surgery, including, but not to infection, bleeding, DVTs, damage to bowel, bladder, ureters. She wishes to proceed.     Emily Filbert 02/11/2016, 8:44 AM

## 2016-02-11 NOTE — Anesthesia Postprocedure Evaluation (Addendum)
Anesthesia Post Note  Patient: Kimberly [name]  Procedure(s) Performed: Procedure(s) (LRB): LAPAROSCOPIC OVARIAN CYSTECTOMY (Left)  Anesthesia Type: General Level of consciousness: awake Pain management: pain level controlled Vital Signs Assessment: post-procedure vital signs reviewed and stable Respiratory status: spontaneous breathing Cardiovascular status: stable Postop Assessment: no signs of nausea or vomiting Anesthetic complications: no        Last Vitals:  Vitals:   02/11/16 1200 02/11/16 1215  BP: 121/76 125/79  Pulse: 94 85  Resp: 14   Temp:      Last Pain:  Vitals:   02/11/16 1120  TempSrc: Oral   Pain Goal: Patients Stated Pain Goal: 5 (02/11/16 0746)               [place]

## 2016-02-11 NOTE — Op Note (Signed)
02/11/2016  10:54 AM  PATIENT:  Kimberly [name]  26 y.o. female  PRE-OPERATIVE DIAGNOSIS:  Left ovarian cyst, morbid obesity  POST-OPERATIVE DIAGNOSIS:  same  PROCEDURE:  Procedure(s): LAPAROSCOPIC OVARIAN CYSTECTOMY (Left)  SURGEON:  Surgeon(s) and Role:    * Emily Filbert, MD - Primary  ASSISTANTS: Emeterio Reeve, MD   ANESTHESIA:   general  EBL:  Total I/O In: 1000 [I.V.:1000] Out: -   BLOOD ADMINISTERED:none  DRAINS: none   LOCAL MEDICATIONS USED:  MARCAINE     SPECIMEN:  Source of Specimen:  ovarian cyst wall  DISPOSITION OF SPECIMEN:  Source of Specimen:  ovarian cyst wall  COUNTS:  YES  TOURNIQUET:  * No tourniquets in log *  DICTATION: .Dragon Dictation  PLAN OF CARE: Discharge to home after PACU  PATIENT DISPOSITION:  PACU - hemodynamically stable.   Delay start of Pharmacological VTE agent (>24hrs) due to surgical blood loss or risk of bleeding: not applicable  The risks, benefits, and alternatives of surgery were explained, understood, accepted. In the operating room she was placed in the dorsal lithotomy position, and general anesthesia was given without complication. Her abdomen and vagina were prepped and draped in the usual sterile fashion. A timeout procedure was done. A Hulka manipulator was placed. Her bladder was emptied with a Robinson catheter. Gloves were changed, and attention was turned to the abdomen. Approximately the 5 mL of 0.5% Marcaine was injected into the umbilicus. A vertical incision was made at the site. A varies needle was placed intraperitoneally. Low-flow CO2 was used to insufflate the abdomen to approximately 3-1/2 L. Once a good pneumoperitoneum was established, a 5 mm trocar was placed in the umbilical incision. Laparoscopy confirmed correct placement. A 5 mm port was placed in the right lower quadrant and a 10 mm port in the left lower quadrant under direct laparoscopic visualization after injecting 0.5% Marcaine in the incision sites.  Her pelvis and upper abdomen appeared normal with the exception of a large left ovarian cyst.  There was an adhesion from the cyst to the omentum noted. A Harmonic scapel was used to remove this adhesion.  I then removed a 5 cm portion of the cyst wall with the Harmonic scapel. Clear fluid was noted and removed. Hemostasis was noted. The right ovary and uterus were then visible. They appeared normal except for an adhesion from the posterior wall of the uterus to the right ovary. I took this down with the Harmonic scapel.  The CO2 was allowed to escape from the abdomen.  I removed the 5 mm ports and noted hemostasis. The fascia at the 10 mm incision site was closed with a 0 vicryl figure of eight suture.  No defects were palpable. A subcuticular closure was done with 4-0 Vicryl suture at all incision sites. A Steri-Strip was placed across each incision. She was extubated and taken to the recovery room in stable condition.

## 2016-02-11 NOTE — Anesthesia Procedure Notes (Signed)
Procedure Name: Intubation Date/Time: 02/11/2016 10:07 AM Performed by: Kenneth Lax, Sheron Nightingale Pre-anesthesia Checklist: Patient identified, Patient being monitored, Emergency Drugs available, Timeout performed and Suction available Patient Re-evaluated:Patient Re-evaluated prior to inductionOxygen Delivery Method: Circle system utilized Preoxygenation: Pre-oxygenation with 100% oxygen Intubation Type: IV induction Ventilation: Mask ventilation without difficulty Laryngoscope Size: Mac, Glidescope and 3 Grade View: Grade III Tube size: 7.0 mm Number of attempts: 2 Airway Equipment and Method: Stylet and Video-laryngoscopy Placement Confirmation: ETT inserted through vocal cords under direct vision,  breath sounds checked- equal and bilateral and positive ETCO2 Secured at: 21 cm Dental Injury: Teeth and Oropharynx as per pre-operative assessment

## 2016-02-11 NOTE — Discharge Instructions (Signed)
DISCHARGE INSTRUCTIONS: Laparoscopy  The following instructions have been prepared to help you care for yourself upon your return home today.  Wound care:  Do not get the incision wet for the first 24 hours. The incision should be kept clean and dry.  The Band-Aids or dressings may be removed the day after surgery.  Should the incision become sore, red, and swollen after the first week, check with your doctor.  Personal hygiene:  Shower the day after your procedure.  Activity and limitations:  Do NOT drive or operate any equipment today.  Do NOT lift anything more than 15 pounds for 2-3 weeks after surgery.  Do NOT rest in bed all day.  Walking is encouraged. Walk each day, starting slowly with 5-minute walks 3 or 4 times a day. Slowly increase the length of your walks.  Walk up and down stairs slowly.  Do NOT do strenuous activities, such as golfing, playing tennis, bowling, running, biking, weight lifting, gardening, mowing, or vacuuming for 2-4 weeks. Ask your doctor when it is okay to start.  Diet: Eat a light meal as desired this evening. You may resume your usual diet tomorrow.  Return to work: This is dependent on the type of work you do. For the most part you can return to a desk job within a week of surgery. If you are more active at work, please discuss this with your doctor.  What to expect after your surgery: You may have a slight burning sensation when you urinate on the first day. You may have a very small amount of blood in the urine. Expect to have a small amount of vaginal discharge/light bleeding for 1-2 weeks. It is not unusual to have abdominal soreness and bruising for up to 2 weeks. You may be tired and need more rest for about 1 week. You may experience shoulder pain for 24-72 hours. Lying flat in bed may relieve it.  Call your doctor for any of the following:  Develop a fever of 100.4 or greater  Inability to urinate 6 hours after discharge from  hospital  Severe pain not relieved by pain medications  Persistent of heavy bleeding at incision site  Redness or swelling around incision site after a week  Increasing nausea or vomiting  Patient Signature________________________________________ Nurse Signature_________________________________________Diagnostic Laparoscopy, Care After Introduction Refer to this sheet in the next few weeks. These instructions provide you with information about caring for yourself after your procedure. Your health care provider may also give you more specific instructions. Your treatment has been planned according to current medical practices, but problems sometimes occur. Call your health care provider if you have any problems or questions after your procedure. What can I expect after the procedure? After your procedure, it is common to have mild discomfort in the throat and abdomen. Follow these instructions at home:  Take over-the-counter and prescription medicines only as told by your health care provider.  Do not drive for 24 hours if you received a sedative.  Return to your normal activities as told by your health care provider.  Do not take baths, swim, or use a hot tub until your health care provider approves. You may shower.  Follow instructions from your health care provider about how to take care of your incision. Make sure you:  Wash your hands with soap and water before you change your bandage (dressing). If soap and water are not available, use hand sanitizer.  Change your dressing as told by your health care provider.  Leave stitches (sutures), skin glue, or adhesive strips in place. These skin closures may need to stay in place for 2 weeks or longer. If adhesive strip edges start to loosen and curl up, you may trim the loose edges. Do not remove adhesive strips completely unless your health care provider tells you to do that.  Check your incision area every day for signs of infection.  Check for:  More redness, swelling, or pain.  More fluid or blood.  Warmth.  Pus or a bad smell.  It is your responsibility to get the results of your procedure. Ask your health care provider or the department performing the procedure when your results will be ready. Contact a health care provider if:  There is new pain in your shoulders.  You feel light-headed or faint.  You are unable to pass gas or unable to have a bowel movement.  You feel nauseous or you vomit.  You develop a rash.  You have more redness, swelling, or pain around your incision.  You have more fluid or blood coming from your incision.  Your incision feels warm to the touch.  You have pus or a bad smell coming from your incision.  You have a fever or chills. Get help right away if:  Your pain is getting worse.  You have ongoing vomiting.  The edges of your incision open up.  You have trouble breathing.  You have chest pain. This information is not intended to replace advice given to you by your health care provider. Make sure you discuss any questions you have with your health care provider. Document Released: 12/17/2014 Document Revised: 06/13/2015 Document Reviewed: 09/18/2014  2017 [employer]

## 2016-02-11 NOTE — Transfer of Care (Signed)
Immediate Anesthesia Transfer of Care Note  Patient: Kimberly [name]  Procedure(s) Performed: Procedure(s): LAPAROSCOPIC OVARIAN CYSTECTOMY (Left)  Patient Location: PACU  Anesthesia Type:General  Level of Consciousness: awake, alert  and oriented  Airway & Oxygen Therapy: Patient Spontanous Breathing and Patient connected to nasal cannula oxygen  Post-op Assessment: Report given to RN and Post -op Vital signs reviewed and stable  Post vital signs: Reviewed and stable  Last Vitals:  Vitals:   02/11/16 0746  BP: 121/79  Pulse: 82  Resp: 18  Temp: 36.7 C    Last Pain:  Vitals:   02/11/16 0746  TempSrc: Oral      Patients Stated Pain Goal: 5 ([id number])  Complications: No apparent anesthesia complications

## 2016-02-11 NOTE — Anesthesia Procedure Notes (Signed)
Performed by: Sorin Frimpong M       

## 2016-02-12 ENCOUNTER — Encounter (HOSPITAL_COMMUNITY): Payer: Self-pay | Admitting: Obstetrics & Gynecology

## 2016-02-29 ENCOUNTER — Encounter (HOSPITAL_COMMUNITY): Payer: Self-pay

## 2016-02-29 ENCOUNTER — Emergency Department (HOSPITAL_COMMUNITY)
Admission: EM | Admit: 2016-02-29 | Discharge: 2016-02-29 | Disposition: A | Discharge status: Home / Self Care | Payer: Self-pay | Attending: Emergency Medicine | Admitting: Emergency Medicine

## 2016-02-29 DIAGNOSIS — E039 Hypothyroidism, unspecified: Secondary | ICD-10-CM | POA: Insufficient documentation

## 2016-02-29 DIAGNOSIS — J45909 Unspecified asthma, uncomplicated: Secondary | ICD-10-CM | POA: Insufficient documentation

## 2016-02-29 DIAGNOSIS — Y999 Unspecified external cause status: Secondary | ICD-10-CM | POA: Insufficient documentation

## 2016-02-29 DIAGNOSIS — Z91040 Latex allergy status: Secondary | ICD-10-CM | POA: Insufficient documentation

## 2016-02-29 DIAGNOSIS — I10 Essential (primary) hypertension: Secondary | ICD-10-CM | POA: Insufficient documentation

## 2016-02-29 DIAGNOSIS — M5442 Lumbago with sciatica, left side: Secondary | ICD-10-CM | POA: Insufficient documentation

## 2016-02-29 DIAGNOSIS — M5432 Sciatica, left side: Secondary | ICD-10-CM

## 2016-02-29 DIAGNOSIS — Y929 Unspecified place or not applicable: Secondary | ICD-10-CM | POA: Insufficient documentation

## 2016-02-29 DIAGNOSIS — X509XXA Other and unspecified overexertion or strenuous movements or postures, initial encounter: Secondary | ICD-10-CM | POA: Insufficient documentation

## 2016-02-29 DIAGNOSIS — Y939 Activity, unspecified: Secondary | ICD-10-CM | POA: Insufficient documentation

## 2016-02-29 LAB — POC URINE PREG, ED: PREG TEST UR: NEGATIVE

## 2016-02-29 MED ORDER — KETOROLAC TROMETHAMINE 60 MG/2ML IM SOLN
30.0000 mg | Freq: Once | INTRAMUSCULAR | Status: AC
  Administered 2016-02-29: 30 mg via INTRAMUSCULAR
  Filled 2016-02-29: qty 2

## 2016-02-29 MED ORDER — DICLOFENAC SODIUM 50 MG PO TBEC: 50.0000 mg | DELAYED_RELEASE_TABLET | Freq: Two times a day (BID) | ORAL | 0 refills | Status: DC

## 2016-02-29 MED ORDER — METHOCARBAMOL 500 MG PO TABS: 500.0000 mg | ORAL_TABLET | Freq: Two times a day (BID) | ORAL | 0 refills | Status: DC

## 2016-02-29 MED ORDER — OXYCODONE-ACETAMINOPHEN 5-325 MG PO TABS
1.0000 | ORAL_TABLET | Freq: Once | ORAL | Status: AC
  Administered 2016-02-29: 1 via ORAL
  Filled 2016-02-29: qty 1

## 2016-02-29 NOTE — ED Notes (Signed)
Pt stable, ambulatory, states understanding of discharge instructions 

## 2016-02-29 NOTE — ED Triage Notes (Signed)
Pt. Was picking up her son this afternoon and felt a tearing /pop sensation in the center of her back.  Pt.; ignored it and was playing with her son on the foor and when she attempted to stand up she felt a severe pop and fell back to the ground , Presently the pain continues to be severe and radiates into the middle of the back. Pt. Denies any  Numbness or tingling.  Skin is warm and dry.  Pt. Is alert and oriented X4

## 2016-02-29 NOTE — ED Provider Notes (Signed)
[facility] DEPT Provider Note    By signing my name below, I, Bea Graff, attest that this documentation has been prepared under the direction and in the presence of [facility], [place]. Electronically Signed: Bea Graff, ED Scribe. 02/29/16. 8:35 PM.    History   Chief Complaint Chief Complaint  Patient presents with  . Back Pain   The history is provided by the patient and medical records. No language interpreter was used.    Kimberly [name] is an obese 26 y.o. female with PMHx of chronic back pain and mild scoliosis who presents to the Emergency Department complaining of low back pain that began about two days ago. She states she was sitting on the floor, playing with her son today about 6.5 hours ago, when she heard and felt a popping and "ripping" sensation. She states once she stood up about 3.5 hours ago, she started experiencing "excuciating" pain. She reports associated pain that radiates to the upper left buttock. She has not taken anything for pain relief. Movements and walking increase her pain. She denies alleviating factors. She denies bowel or bladder incontinence, dysuria, hematuria or any other urinary complaints, numbness, tingling or weakness of any extremity, bruising or wounds.   Past Medical History:  Diagnosis Date  . Anxiety   . Asthma    "grew out of it" (11/16/2012)  . Chronic back pain   . Complication of anesthesia    "takes a lot to put me to sleep; woke up completely mid endoscopy" (11/16/2012)  . Depression   . Dysrhythmia    ST (11/16/2012)  . Gastric reflux   . 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 Iron deficiency anemia   . Migraines    "weekly" (11/16/2012)  . Ovarian cyst   . Pregnancy induced hypertension   . Schizophrenia ([place])    "moderate" (11/16/2012)  . Scoliosis     Patient Active Problem List   Diagnosis Date Noted  . Anemia 01/16/2016  . Ovarian cyst 04/25/2015  . Rubella non-immune status,  antepartum 03/11/2015  . 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 Goiter 03/01/2011  . Acanthosis nigricans, acquired 03/01/2011  . Oligomenorrhea 03/01/2011  . Hirsutism 03/01/2011  . PCOS (polycystic ovarian syndrome) 03/01/2011  . Hypertension 03/01/2011  . GERD (gastroesophageal reflux disease) 03/01/2011  . Anxiety and depression 03/01/2011  . Obesity 07/03/2010    Past Surgical History:  Procedure Laterality Date  . LAPAROSCOPIC OVARIAN CYSTECTOMY Left 02/11/2016   Procedure: LAPAROSCOPIC OVARIAN CYSTECTOMY;  Surgeon: Emily Filbert, MD;  Location: [facility];  Service: Gynecology;  Laterality: Left;  . UPPER GI ENDOSCOPY  ~ 2006; ~2008    OB History    Gravida Para Term Preterm AB Living   2 1 1   1 1    SAB TAB Ectopic Multiple Live Births   1     0 1       Home Medications    Prior to Admission medications   Medication Sig Start Date End Date Taking? Authorizing Provider  diclofenac (VOLTAREN) 50 MG EC tablet Take 1 tablet (50 mg total) by mouth 2 (two) times daily. 02/29/16   Hope Bunnie Pion, NP  FLUoxetine (PROZAC) 20 MG tablet Take 1 tablet (20 mg total) by mouth daily. Patient not taking: Reported on 01/16/2016 08/22/15   Arnoldo Morale, MD  ibuprofen (ADVIL,MOTRIN) 600 MG tablet Take 1 tablet (600 mg total) by mouth every 6 (six) hours as needed. Patient not taking: Reported on  03/01/2016 02/11/16   Emily Filbert, MD  levothyroxine (SYNTHROID, LEVOTHROID) 175 MCG tablet Take 1 tablet (175 mcg total) by mouth daily before breakfast. 11/04/15   Arnoldo Morale, MD  methocarbamol (ROBAXIN) 500 MG tablet Take 1 tablet (500 mg total) by mouth 2 (two) times daily. 02/29/16   Hope Bunnie Pion, NP  Norgestimate-Ethinyl Estradiol Triphasic (ORTHO TRI-CYCLEN LO) 0.18/0.215/0.25 MG-25 MCG tab Take 1 tablet by mouth daily. Patient not taking: Reported on 01/16/2016 09/18/15   Chancy Milroy, MD  oxyCODONE-acetaminophen (PERCOCET/ROXICET) 5-325 MG tablet Take 1-2 tablets by mouth  every 6 (six) hours as needed. Patient not taking: Reported on 03/01/2016 02/11/16   Emily Filbert, MD  promethazine (PHENERGAN) 25 MG tablet Take 1 tablet (25 mg total) by mouth every 6 (six) hours as needed for nausea or vomiting. 03/01/16   Pattricia Boss, MD  traMADol (ULTRAM) 50 MG tablet Take 1 tablet (50 mg total) by mouth every 6 (six) hours as needed. Patient not taking: Reported on 03/01/2016 12/25/15   Gibson Ramp, MD    Family History Family History  Problem Relation Age of Onset  . Diabetes Mother   . Hypertension Mother   . Vision loss Mother   . Hypertension Father   . Hyperlipidemia Father   . Thyroid disease Maternal Grandmother     Social History Social History  Substance Use Topics  . Smoking status: Never Smoker  . Smokeless tobacco: Never Used  . Alcohol use Yes     Comment: very rare     Allergies   Sulfa antibiotics; Abilify [aripiprazole]; Amoxicillin-pot clavulanate; Cephalexin; Ferrous sulfate; Latex; Tape; Zicam cold remedy [erysidoron #1]; Keflex [cephalexin]; Percocet [oxycodone-acetaminophen]; and Vicodin [hydrocodone-acetaminophen]   Review of Systems Review of Systems  Constitutional: Negative for chills and fever.  Respiratory: Negative for cough and chest tightness.   Gastrointestinal: Negative for abdominal pain, diarrhea, nausea and vomiting.  Genitourinary: Negative for difficulty urinating, dysuria and frequency.  Musculoskeletal: Positive for back pain. Negative for gait problem, myalgias and neck pain.  Skin: Negative for rash.  Neurological: Negative for dizziness and headaches.  Psychiatric/Behavioral: Negative for confusion.     Physical Exam Updated Vital Signs BP 112/83 (BP Location: Right Arm)   Pulse 97   Temp 99 F (37.2 C) (Oral)   Resp 18   Ht 5\' 4"  (1.626 m)   Wt 230 lb (104.3 kg)   LMP 02/10/2016 (Exact Date)   SpO2 98%   BMI 39.48 kg/m   Physical Exam  Constitutional: She is oriented to person, place, and time.  She appears well-developed and well-nourished. No distress.  HENT:  Head: Normocephalic.  Eyes: EOM are normal.  Neck: Neck supple.  Cardiovascular: Normal rate and regular rhythm.   Dorsalis Pedis pulses 2+ bilaterally. Radial pulses 2+ bilaterally. Adequate circulation.  Pulmonary/Chest: Effort normal and breath sounds normal.  No lower extremity swelling.  Abdominal: Soft. There is no tenderness. There is no CVA tenderness.  Musculoskeletal: She exhibits tenderness. She exhibits no edema or deformity.  SLR without difficulty bilaterally. Tenderness to palpation over lumbar paraspinal muscles with pain radiating to left sciatic nerve. No tenderness over cervical or thoracic spine.  Neurological: She is alert and oriented to person, place, and time. She has normal strength. No cranial nerve deficit.  Reflex Scores:      Bicep reflexes are 2+ on the right side and 2+ on the left side.      Brachioradialis reflexes are 2+ on the right side and  2+ on the left side.      Patellar reflexes are 2+ on the right side and 2+ on the left side. Grip strength normal bilaterally. DTRs 2+ bilaterally. Steady gait. No foot drag.  Skin: Skin is warm and dry.  Psychiatric: She has a normal mood and affect. Her behavior is normal.  Nursing note and vitals reviewed.    ED Treatments / Results  DIAGNOSTIC STUDIES: Oxygen Saturation is 98% on RA, normal by my interpretation.   COORDINATION OF CARE: 6:30 PM- Will order pain medication prior to discharge. Will give referral to orthopedist. Pt verbalizes understanding and agrees to plan.  8:35 PM- Pt states her pain has improved after receiving the medications and she is now ready for discharge.    Medications  ketorolac (TORADOL) injection 30 mg (30 mg Intramuscular Given 02/29/16 1842)  oxyCODONE-acetaminophen (PERCOCET/ROXICET) 5-325 MG per tablet 1 tablet (1 tablet Oral Given 02/29/16 1841)    Labs (all labs ordered are listed, but only abnormal  results are displayed) Labs Reviewed  POC URINE PREG, ED    Radiology No results found.  Procedures Procedures (including critical care time)  Medications Ordered in ED Medications  ketorolac (TORADOL) injection 30 mg (30 mg Intramuscular Given 02/29/16 1842)  oxyCODONE-acetaminophen (PERCOCET/ROXICET) 5-325 MG per tablet 1 tablet (1 tablet Oral Given 02/29/16 1841)     Initial Impression / Assessment and Plan / ED Course  I have reviewed the triage vital signs and the nursing notes.  Patient reports that the Toradol helped a lot and she is ready for d/c.  Final Clinical Impressions(s) / ED Diagnoses  26 y.o. female with hx of back pain stable for d/c without neuro deficits. Will treat for muscle spasm and she will f/u with her PCP or return here for worsening symptoms.  Final diagnoses:  Sciatica, left side    New Prescriptions Discharge Medication List as of 02/29/2016  8:35 PM    START taking these medications   Details  diclofenac (VOLTAREN) 50 MG EC tablet Take 1 tablet (50 mg total) by mouth 2 (two) times daily., Starting Sat 02/29/2016, Print    methocarbamol (ROBAXIN) 500 MG tablet Take 1 tablet (500 mg total) by mouth 2 (two) times daily., Starting Sat 02/29/2016, [place], [place] 03/03/16 [place], MD 03/04/16 1226

## 2016-02-29 NOTE — Discharge Instructions (Signed)
Do not drive while taking the medication because it will make you sleepy.

## 2016-03-01 ENCOUNTER — Encounter (HOSPITAL_COMMUNITY): Payer: Self-pay | Admitting: Emergency Medicine

## 2016-03-01 ENCOUNTER — Emergency Department (HOSPITAL_COMMUNITY)
Admission: EM | Admit: 2016-03-01 | Discharge: 2016-03-01 | Disposition: A | Discharge status: Home / Self Care | Payer: Self-pay | Attending: Emergency Medicine | Admitting: Emergency Medicine

## 2016-03-01 DIAGNOSIS — J45909 Unspecified asthma, uncomplicated: Secondary | ICD-10-CM | POA: Insufficient documentation

## 2016-03-01 DIAGNOSIS — K529 Noninfective gastroenteritis and colitis, unspecified: Secondary | ICD-10-CM | POA: Insufficient documentation

## 2016-03-01 DIAGNOSIS — E039 Hypothyroidism, unspecified: Secondary | ICD-10-CM | POA: Insufficient documentation

## 2016-03-01 DIAGNOSIS — Z91040 Latex allergy status: Secondary | ICD-10-CM | POA: Insufficient documentation

## 2016-03-01 DIAGNOSIS — I10 Essential (primary) hypertension: Secondary | ICD-10-CM | POA: Insufficient documentation

## 2016-03-01 DIAGNOSIS — Z79899 Other long term (current) drug therapy: Secondary | ICD-10-CM | POA: Insufficient documentation

## 2016-03-01 DIAGNOSIS — E860 Dehydration: Secondary | ICD-10-CM | POA: Insufficient documentation

## 2016-03-01 LAB — COMPREHENSIVE METABOLIC PANEL
ALT: 20 U/L (ref 14–54)
AST: 22 U/L (ref 15–41)
Albumin: 3.5 g/dL (ref 3.5–5.0)
Alkaline Phosphatase: 72 U/L (ref 38–126)
Anion gap: 15 (ref 5–15)
BILIRUBIN TOTAL: 1 mg/dL (ref 0.3–1.2)
BUN: 13 mg/dL (ref 6–20)
CHLORIDE: 108 mmol/L (ref 101–111)
CO2: 17 mmol/L — ABNORMAL LOW (ref 22–32)
CREATININE: 0.58 mg/dL (ref 0.44–1.00)
Calcium: 8.5 mg/dL — ABNORMAL LOW (ref 8.9–10.3)
GFR calc Af Amer: >60 mL/min (ref >60)
GLUCOSE: 130 mg/dL — AB (ref 65–99)
Potassium: 3.9 mmol/L (ref 3.5–5.1)
Sodium: 140 mmol/L (ref 135–145)
Total Protein: 6.4 g/dL — ABNORMAL LOW (ref 6.5–8.1)

## 2016-03-01 LAB — CBC
HCT: 37 % (ref 36.0–46.0)
Hemoglobin: 11.1 g/dL — ABNORMAL LOW (ref 12.0–15.0)
MCH: 23.3 pg — ABNORMAL LOW (ref 26.0–34.0)
MCHC: 30 g/dL (ref 30.0–36.0)
MCV: 77.6 fL — AB (ref 78.0–100.0)
PLATELETS: 345 10*3/uL (ref 150–400)
RBC: 4.77 MIL/uL (ref 3.87–5.11)
RDW: 15.5 % (ref 11.5–15.5)
WBC: 11.3 10*3/uL — AB (ref 4.0–10.5)

## 2016-03-01 LAB — I-STAT BETA HCG BLOOD, ED (MC, WL, AP ONLY): I-stat hCG, quantitative: <5 m[IU]/mL (ref <5)

## 2016-03-01 LAB — I-STAT CG4 LACTIC ACID, ED
LACTIC ACID, VENOUS: 1.6 mmol/L (ref 0.5–1.9)
Lactic Acid, Venous: 2.15 mmol/L (ref 0.5–1.9)

## 2016-03-01 LAB — LIPASE, BLOOD: LIPASE: 25 U/L (ref 11–51)

## 2016-03-01 LAB — I-STAT TROPONIN, ED: Troponin i, poc: 0 ng/mL (ref 0.00–0.08)

## 2016-03-01 MED ORDER — ONDANSETRON 4 MG PO TBDP
ORAL_TABLET | ORAL | Status: AC
  Filled 2016-03-01: qty 1

## 2016-03-01 MED ORDER — ONDANSETRON 4 MG PO TBDP: 4.0000 mg | ORAL_TABLET | Freq: Once | ORAL | Status: DC | PRN

## 2016-03-01 MED ORDER — PROMETHAZINE HCL 25 MG PO TABS: 25.0000 mg | ORAL_TABLET | Freq: Four times a day (QID) | ORAL | 0 refills | Status: DC | PRN

## 2016-03-01 MED ORDER — SODIUM CHLORIDE 0.9 % IV BOLUS (SEPSIS)
2500.0000 mL | Freq: Once | INTRAVENOUS | Status: AC
  Administered 2016-03-01: 2500 mL via INTRAVENOUS

## 2016-03-01 MED ORDER — PROMETHAZINE HCL 25 MG/ML IJ SOLN
25.0000 mg | Freq: Once | INTRAMUSCULAR | Status: AC
  Administered 2016-03-01: 25 mg via INTRAVENOUS
  Filled 2016-03-01: qty 1

## 2016-03-01 NOTE — ED Notes (Signed)
Pt is aware she needs a urine sample 

## 2016-03-01 NOTE — ED Notes (Signed)
Pt tolerating water, no nausea present

## 2016-03-01 NOTE — ED Provider Notes (Signed)
[facility] DEPT Provider Note   CSN: [id number] Arrival date & time: 03/01/16  [id number]     History   Chief Complaint Chief Complaint  Patient presents with  . Emesis  . Diarrhea    HPI Kimberly [name] is a 26 y.o. female.  HPI This is a 26 year old who presents today complaining of nausea, vomiting, and diarrhea that started last night. She states that she vomited many times during the night. The stool has been loose but not bloody. She has had some subjective fever and chills. She was seen here yesterday for back pain. At that time she did not have these symptoms. She had been on Percocet for pain from her ovarian cyst and surgery. She completed about a week ago and received 1 Percocet last night. She had not had symptoms of vomiting and diarrhea prior to last night. Past Medical History:  Diagnosis Date  . Anxiety   . Asthma    "grew out of it" (11/16/2012)  . Chronic back pain   . Complication of anesthesia    "takes a lot to put me to sleep; woke up completely mid endoscopy" (11/16/2012)  . Depression   . Dysrhythmia    ST (11/16/2012)  . Gastric reflux   . 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 Iron deficiency anemia   . Migraines    "weekly" (11/16/2012)  . Ovarian cyst   . Pregnancy induced hypertension   . Schizophrenia ([place])    "moderate" (11/16/2012)  . Scoliosis     Patient Active Problem List   Diagnosis Date Noted  . Anemia 01/16/2016  . Ovarian cyst 04/25/2015  . Rubella non-immune status, antepartum 03/11/2015  . 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 Goiter 03/01/2011  . Acanthosis nigricans, acquired 03/01/2011  . Oligomenorrhea 03/01/2011  . Hirsutism 03/01/2011  . PCOS (polycystic ovarian syndrome) 03/01/2011  . Hypertension 03/01/2011  . GERD (gastroesophageal reflux disease) 03/01/2011  . Anxiety and depression 03/01/2011  . Obesity 07/03/2010    Past Surgical History:  Procedure Laterality Date   . LAPAROSCOPIC OVARIAN CYSTECTOMY Left 02/11/2016   Procedure: LAPAROSCOPIC OVARIAN CYSTECTOMY;  Surgeon: Emily Filbert, MD;  Location: [facility];  Service: Gynecology;  Laterality: Left;  . UPPER GI ENDOSCOPY  ~ 2006; ~2008    OB History    Gravida Para Term Preterm AB Living   2 1 1   1 1    SAB TAB Ectopic Multiple Live Births   1     0 1       Home Medications    Prior to Admission medications   Medication Sig Start Date End Date Taking? Authorizing Provider  levothyroxine (SYNTHROID, LEVOTHROID) 175 MCG tablet Take 1 tablet (175 mcg total) by mouth daily before breakfast. 11/04/15  Yes Arnoldo Morale, MD  diclofenac (VOLTAREN) 50 MG EC tablet Take 1 tablet (50 mg total) by mouth 2 (two) times daily. 02/29/16   Hope Bunnie Pion, NP  FLUoxetine (PROZAC) 20 MG tablet Take 1 tablet (20 mg total) by mouth daily. Patient not taking: Reported on 01/16/2016 08/22/15   Arnoldo Morale, MD  ibuprofen (ADVIL,MOTRIN) 600 MG tablet Take 1 tablet (600 mg total) by mouth every 6 (six) hours as needed. Patient not taking: Reported on 03/01/2016 02/11/16   Emily Filbert, MD  methocarbamol (ROBAXIN) 500 MG tablet Take 1 tablet (500 mg total) by mouth 2 (two) times daily. 02/29/16   Hope Bunnie Pion, NP  Norgestimate-Ethinyl Estradiol Triphasic (ORTHO TRI-CYCLEN  LO) 0.18/0.215/0.25 MG-25 MCG tab Take 1 tablet by mouth daily. Patient not taking: Reported on 01/16/2016 09/18/15   Chancy Milroy, MD  oxyCODONE-acetaminophen (PERCOCET/ROXICET) 5-325 MG tablet Take 1-2 tablets by mouth every 6 (six) hours as needed. Patient not taking: Reported on 03/01/2016 02/11/16   Emily Filbert, MD  traMADol (ULTRAM) 50 MG tablet Take 1 tablet (50 mg total) by mouth every 6 (six) hours as needed. Patient not taking: Reported on 03/01/2016 12/25/15   Gibson Ramp, MD    Family History Family History  Problem Relation Age of Onset  . Diabetes Mother   . Hypertension Mother   . Vision loss Mother   . Hypertension Father   . Hyperlipidemia  Father   . Thyroid disease Maternal Grandmother     Social History Social History  Substance Use Topics  . Smoking status: Never Smoker  . Smokeless tobacco: Never Used  . Alcohol use Yes     Comment: very rare     Allergies   Sulfa antibiotics; Abilify [aripiprazole]; Amoxicillin-pot clavulanate; Cephalexin; Ferrous sulfate; Latex; Tape; Zicam cold remedy [erysidoron #1]; Keflex [cephalexin]; Percocet [oxycodone-acetaminophen]; and Vicodin [hydrocodone-acetaminophen]   Review of Systems Review of Systems  All other systems reviewed and are negative.    Physical Exam Updated Vital Signs BP 115/69   Pulse (!) 136   Temp 100.9 F (38.3 C) (Oral)   Resp 25   LMP 02/10/2016 (Exact Date)   SpO2 97%   Physical Exam  Constitutional: She is oriented to person, place, and time. She appears well-developed and well-nourished.  HENT:  Head: Normocephalic and atraumatic.  Right Ear: External ear normal.  Left Ear: External ear normal.  Eyes: Conjunctivae and EOM are normal. Pupils are equal, round, and reactive to light.  Neck: Normal range of motion.  Cardiovascular: Regular rhythm.  Tachycardia present.   Pulmonary/Chest: Effort normal and breath sounds normal.  Abdominal: Soft. Bowel sounds are normal. She exhibits no distension. There is no tenderness. There is no guarding.  Musculoskeletal: Normal range of motion.  Neurological: She is alert and oriented to person, place, and time.  Skin: Skin is warm. Capillary refill takes less than 2 seconds. There is pallor.  Psychiatric: She has a normal mood and affect.  Nursing note and vitals reviewed.    ED Treatments / Results  Labs (all labs ordered are listed, but only abnormal results are displayed) Labs Reviewed  COMPREHENSIVE METABOLIC PANEL - Abnormal; Notable for the following:       Result Value   CO2 17 (*)    Glucose, Bld 130 (*)    Calcium 8.5 (*)    Total Protein 6.4 (*)    All other components within  normal limits  CBC - Abnormal; Notable for the following:    WBC 11.3 (*)    Hemoglobin 11.1 (*)    MCV 77.6 (*)    MCH 23.3 (*)    All other components within normal limits  I-STAT CG4 LACTIC ACID, ED - Abnormal; Notable for the following:    Lactic Acid, Venous 2.15 (*)    All other components within normal limits  CULTURE, BLOOD (ROUTINE X 2)  CULTURE, BLOOD (ROUTINE X 2)  LIPASE, BLOOD  URINALYSIS, ROUTINE W REFLEX MICROSCOPIC  I-STAT BETA HCG BLOOD, ED ([facility], [facility], [facility] ONLY)    EKG  EKG Interpretation  Date/Time:  Sunday March 01 2016 06:35:57 EST Ventricular Rate:  165 PR Interval:    QRS Duration: 76 QT Interval:  294 QTC Calculation: 487 R Axis:   43 Text Interpretation:  Sinus tachycardia Nonspecific T wave abnormality Abnormal ECG Confirmed by Gerald Kuehl MD, Andee Poles [id number]) on 03/01/2016 7:36:18 AM       Radiology No results found.  Procedures Procedures (including critical care time)  Medications Ordered in ED Medications  ondansetron (ZOFRAN-ODT) disintegrating tablet 4 mg (not administered)  ondansetron (ZOFRAN-ODT) 4 MG disintegrating tablet (not administered)  sodium chloride 0.9 % bolus 2,500 mL (2,500 mLs Intravenous New Bag/Given 03/01/16 0840)  promethazine (PHENERGAN) injection 25 mg (25 mg Intravenous Given 03/01/16 0840)     Initial Impression / Assessment and Plan / ED Course  I have reviewed the triage vital signs and the nursing notes.  Pertinent labs & imaging results that were available during my care of the patient were reviewed by me and considered in my medical decision making (see chart for details). Sepsis - Repeat Assessment  Performed at:    10:00 AM  Vitals     Blood pressure 115/67, pulse (!) 129, temperature 100.9 F (38.3 C), temperature source Oral, resp. rate 23, last menstrual period 02/10/2016, SpO2 100 %, not currently breastfeeding.  Heart:     Tachycardic  Lungs:    CTA  Capillary Refill:   <2 sec  Peripheral  Pulse:   palpable  Skin:     Pale       Patient feels greatly improved. She has not vomited or had diarrhea since last assessed.  IV fluids infusing and patient requests ice chips.   10:51 AM Fluids have finished infusing. Patient states that she continues to feel improved. She did have one episode of loose stool since since my last evaluation. She has had ice chips without vomiting is going to try clear liquids. 12:37 PM Patient's lactic acid has cleared. Her heart rate is down to between 110-120. Patient reports this is her baseline heart rate. She is taking by mouth without difficulty. We discussed return precautions and need for follow-up and she voices understanding. Final Clinical Impressions(s) / ED Diagnoses   Final diagnoses:  Dehydration  Gastroenteritis    New Prescriptions New Prescriptions   No medications on file     Pattricia Boss, MD 03/05/16 1351

## 2016-03-01 NOTE — ED Triage Notes (Signed)
Pt reports constant nausea and vomiting for the last 12 hours. States emesis more than 20. HR noted to be irregular in triage, EKG performed.

## 2016-03-02 ENCOUNTER — Telehealth (HOSPITAL_BASED_OUTPATIENT_CLINIC_OR_DEPARTMENT_OTHER): Payer: Self-pay | Admitting: *Deleted

## 2016-03-02 LAB — BLOOD CULTURE ID PANEL (REFLEXED)
Acinetobacter baumannii: NOT DETECTED
CANDIDA GLABRATA: NOT DETECTED
CANDIDA KRUSEI: NOT DETECTED
CANDIDA TROPICALIS: NOT DETECTED
Candida albicans: NOT DETECTED
Candida parapsilosis: NOT DETECTED
ENTEROBACTER CLOACAE COMPLEX: NOT DETECTED
ENTEROCOCCUS SPECIES: NOT DETECTED
ESCHERICHIA COLI: NOT DETECTED
Enterobacteriaceae species: NOT DETECTED
Haemophilus influenzae: NOT DETECTED
KLEBSIELLA PNEUMONIAE: NOT DETECTED
Klebsiella oxytoca: NOT DETECTED
LISTERIA MONOCYTOGENES: NOT DETECTED
Methicillin resistance: NOT DETECTED
NEISSERIA MENINGITIDIS: NOT DETECTED
PROTEUS SPECIES: NOT DETECTED
Pseudomonas aeruginosa: NOT DETECTED
SERRATIA MARCESCENS: NOT DETECTED
STAPHYLOCOCCUS AUREUS BCID: NOT DETECTED
STAPHYLOCOCCUS SPECIES: DETECTED — AB
Streptococcus agalactiae: NOT DETECTED
Streptococcus pneumoniae: NOT DETECTED
Streptococcus pyogenes: NOT DETECTED
Streptococcus species: NOT DETECTED

## 2016-03-02 NOTE — Telephone Encounter (Signed)
Lab called and reported blood culture drawn on 03/01/16 had 1 out of 4 bottles positive for gram + cocci in clusters, not staph aureus, coag neg. Suspect contaminant. Dr. Deno Etienne notified of results and stated to contact pt and see how she was feeling and if worse, have her return to ED. Attempt x 2 made to contact patient at [phone] and messages left to call [facility] ED.

## 2016-03-03 LAB — CULTURE, BLOOD (ROUTINE X 2)

## 2016-03-04 MED FILL — PROMETHAZINE 25 MG TABLET: 25 | 7 days supply | Qty: 30 | Fill #0

## 2016-03-06 LAB — CULTURE, BLOOD (ROUTINE X 2): Culture: NO GROWTH

## 2016-05-17 ENCOUNTER — Emergency Department (HOSPITAL_COMMUNITY): Payer: Self-pay

## 2016-05-17 ENCOUNTER — Emergency Department (HOSPITAL_COMMUNITY)
Admission: EM | Admit: 2016-05-17 | Discharge: 2016-05-17 | Disposition: A | Discharge status: Home / Self Care | Payer: Self-pay | Attending: Emergency Medicine | Admitting: Emergency Medicine

## 2016-05-17 ENCOUNTER — Encounter (HOSPITAL_COMMUNITY): Payer: Self-pay | Admitting: Emergency Medicine

## 2016-05-17 DIAGNOSIS — J45909 Unspecified asthma, uncomplicated: Secondary | ICD-10-CM | POA: Insufficient documentation

## 2016-05-17 DIAGNOSIS — X58XXXA Exposure to other specified factors, initial encounter: Secondary | ICD-10-CM | POA: Insufficient documentation

## 2016-05-17 DIAGNOSIS — Z79899 Other long term (current) drug therapy: Secondary | ICD-10-CM | POA: Insufficient documentation

## 2016-05-17 DIAGNOSIS — Z91040 Latex allergy status: Secondary | ICD-10-CM | POA: Insufficient documentation

## 2016-05-17 DIAGNOSIS — Y939 Activity, unspecified: Secondary | ICD-10-CM | POA: Insufficient documentation

## 2016-05-17 DIAGNOSIS — Y929 Unspecified place or not applicable: Secondary | ICD-10-CM | POA: Insufficient documentation

## 2016-05-17 DIAGNOSIS — Y999 Unspecified external cause status: Secondary | ICD-10-CM | POA: Insufficient documentation

## 2016-05-17 DIAGNOSIS — S39012A Strain of muscle, fascia and tendon of lower back, initial encounter: Secondary | ICD-10-CM | POA: Insufficient documentation

## 2016-05-17 DIAGNOSIS — I10 Essential (primary) hypertension: Secondary | ICD-10-CM | POA: Insufficient documentation

## 2016-05-17 DIAGNOSIS — E039 Hypothyroidism, unspecified: Secondary | ICD-10-CM | POA: Insufficient documentation

## 2016-05-17 LAB — POC URINE PREG, ED: Preg Test, Ur: NEGATIVE

## 2016-05-17 MED ORDER — ACETAMINOPHEN 325 MG PO TABS
650.0000 mg | ORAL_TABLET | Freq: Once | ORAL | Status: AC
  Administered 2016-05-17: 650 mg via ORAL
  Filled 2016-05-17: qty 2

## 2016-05-17 MED ORDER — NAPROXEN 250 MG PO TABS
500.0000 mg | ORAL_TABLET | Freq: Once | ORAL | Status: AC
  Administered 2016-05-17: 500 mg via ORAL
  Filled 2016-05-17: qty 2

## 2016-05-17 MED ORDER — ACETAMINOPHEN 325 MG PO TABS: 650.0000 mg | ORAL_TABLET | Freq: Four times a day (QID) | ORAL | 1 refills | Status: DC | PRN

## 2016-05-17 MED ORDER — METHOCARBAMOL 500 MG PO TABS
500.0000 mg | ORAL_TABLET | Freq: Once | ORAL | Status: AC
  Administered 2016-05-17: 500 mg via ORAL
  Filled 2016-05-17: qty 1

## 2016-05-17 MED ORDER — IBUPROFEN 600 MG PO TABS: 600.0000 mg | ORAL_TABLET | Freq: Four times a day (QID) | ORAL | 0 refills | Status: DC | PRN

## 2016-05-17 MED ORDER — METHOCARBAMOL 500 MG PO TABS: 500.0000 mg | ORAL_TABLET | Freq: Two times a day (BID) | ORAL | 0 refills | Status: DC

## 2016-05-17 NOTE — ED Notes (Signed)
Declined W/C at D/C and was escorted to lobby by RN. 

## 2016-05-17 NOTE — ED Provider Notes (Signed)
[facility] DEPT Provider Note    By signing my name below, I, Bea Graff, attest that this documentation has been prepared under the direction and in the presence of Varney Biles, MD. Electronically Signed: Bea Graff, ED Scribe. 05/17/16. 3:05 PM.    History   Chief Complaint Chief Complaint  Patient presents with  . Back Pain   The history is provided by the patient and medical records. No language interpreter was used.    Kimberly [name] is an obese 26 y.o. female with chronic back pain, depression, anxiety, hypothyroidism, schizophrenia and scoliosis who presents to the Emergency Department complaining of gradually worsening, waxing and waning lower back pain that began about 2 weeks ago. She reports associated stiffness of the lower back and intermittent nausea due to pain. She does not recall a specific event that caused the pain. Pt reports being diagnosed with moderate scoliosis about 8 years ago and has had problems intermittently since. She has taken Percocet last night from an old prescription for pain with moderate relief. Leaning forward and lifting increase the pain. She denies alleviating factors. 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 She denies numbness, tingling or weakness of any extremity, bowel or bladder incontinence. She does not have a PCP.   Past Medical History:  Diagnosis Date  . Anxiety   . Asthma    "grew out of it" (11/16/2012)  . Chronic back pain   . Complication of anesthesia    "takes a lot to put me to sleep; woke up completely mid endoscopy" (11/16/2012)  . Depression   . Dysrhythmia    ST (11/16/2012)  . Gastric reflux   . 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 Iron deficiency anemia   . Migraines    "weekly" (11/16/2012)  . Ovarian cyst   . Pregnancy induced hypertension   . Schizophrenia ([place])    "moderate"  (11/16/2012)  . Scoliosis     Patient Active Problem List   Diagnosis Date Noted  . Anemia 01/16/2016  . Ovarian cyst 04/25/2015  . Rubella non-immune status, antepartum 03/11/2015  . 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 Goiter 03/01/2011  . Acanthosis nigricans, acquired 03/01/2011  . Oligomenorrhea 03/01/2011  . Hirsutism 03/01/2011  . PCOS (polycystic ovarian syndrome) 03/01/2011  . Hypertension 03/01/2011  . GERD (gastroesophageal reflux disease) 03/01/2011  . Anxiety and depression 03/01/2011  . Obesity 07/03/2010    Past Surgical History:  Procedure Laterality Date  . LAPAROSCOPIC OVARIAN CYSTECTOMY Left 02/11/2016   Procedure: LAPAROSCOPIC OVARIAN CYSTECTOMY;  Surgeon: Emily Filbert, MD;  Location: [facility];  Service: Gynecology;  Laterality: Left;  . UPPER GI ENDOSCOPY  ~ 2006; ~2008    OB History    Gravida Para Term Preterm AB Living   2 1 1   1 1    SAB TAB Ectopic Multiple Live Births   1     0 1       Home Medications    Prior to Admission medications   Medication Sig Start Date End Date Taking? Authorizing Provider  acetaminophen (TYLENOL) 325 MG tablet Take 2 tablets (650 mg total) by mouth every 6 (six) hours as needed. 05/17/16   Varney Biles, MD  diclofenac (VOLTAREN) 50 MG EC tablet Take 1 tablet (50 mg total) by mouth 2 (two) times daily. 02/29/16   Hope Bunnie Pion, NP  FLUoxetine (PROZAC) 20 MG tablet Take 1 tablet (20 mg total) by mouth daily. Patient not taking: Reported on 01/16/2016 08/22/15   Arnoldo Morale, MD  ibuprofen (ADVIL,MOTRIN) 600 MG tablet Take 1 tablet (600 mg total) by mouth every 6 (six) hours as needed. 05/17/16   Varney Biles, MD  levothyroxine (SYNTHROID, LEVOTHROID) 175 MCG tablet Take 1 tablet (175 mcg total) by mouth daily before breakfast. 11/04/15   Arnoldo Morale, MD  methocarbamol (ROBAXIN) 500 MG tablet Take 1 tablet (500 mg total) by mouth 2 (two) times daily. 05/17/16   Varney Biles, MD  Norgestimate-Ethinyl  Estradiol Triphasic (ORTHO TRI-CYCLEN LO) 0.18/0.215/0.25 MG-25 MCG tab Take 1 tablet by mouth daily. Patient not taking: Reported on 01/16/2016 09/18/15   Chancy Milroy, MD  oxyCODONE-acetaminophen (PERCOCET/ROXICET) 5-325 MG tablet Take 1-2 tablets by mouth every 6 (six) hours as needed. Patient not taking: Reported on 03/01/2016 02/11/16   Emily Filbert, MD  promethazine (PHENERGAN) 25 MG tablet Take 1 tablet (25 mg total) by mouth every 6 (six) hours as needed for nausea or vomiting. 03/01/16   Pattricia Boss, MD  traMADol (ULTRAM) 50 MG tablet Take 1 tablet (50 mg total) by mouth every 6 (six) hours as needed. Patient not taking: Reported on 03/01/2016 12/25/15   Gibson Ramp, MD    Family History Family History  Problem Relation Age of Onset  . Diabetes Mother   . Hypertension Mother   . Vision loss Mother   . Hypertension Father   . Hyperlipidemia Father   . Thyroid disease Maternal Grandmother     Social History Social History  Substance Use Topics  . Smoking status: Never Smoker  . Smokeless tobacco: Never Used  . Alcohol use Yes     Comment: very rare     Allergies   Sulfa antibiotics; Abilify [aripiprazole]; Amoxicillin-pot clavulanate; Cephalexin; Ferrous sulfate; Latex; Tape; Zicam cold remedy [erysidoron #1]; Keflex [cephalexin]; Percocet [oxycodone-acetaminophen]; and Vicodin [hydrocodone-acetaminophen]   Review of Systems Review of Systems  Gastrointestinal: Positive for nausea.       No bowel or bladder incontinence  Musculoskeletal: Positive for back pain.  Neurological: Negative for weakness and numbness.     Physical Exam Updated Vital Signs BP (!) 135/91 (BP Location: Left Arm)   Pulse (!) 117   Temp 99.2 F (37.3 C) (Oral)   Resp 14   Ht 5' 4.5" (1.638 m)   Wt 220 lb (99.8 kg)   LMP 04/19/2016   SpO2 98%   BMI 37.18 kg/m   Physical Exam  Constitutional: She is oriented to person, place, and time. She appears well-developed and well-nourished. No  distress.  HENT:  Head: Normocephalic and atraumatic.  Eyes: EOM are normal.  Neck: Normal range of motion.  Cardiovascular: Normal rate, regular rhythm and normal heart sounds.   Pulmonary/Chest: Effort normal and breath sounds normal.  Abdominal: Soft. She exhibits no distension. There is no tenderness.  Musculoskeletal: Normal range of motion. She exhibits tenderness.  Lower lumbar spine and sacral tenderness, worse with palpation. Left sided paraspinal tenderness. Tenderness worse with plantar flexion of great toes bilaterally. Tenderness with passive leg raises bilaterally.  Neurological: She is alert and oriented to person, place, and time. She displays normal reflexes.  Patellar reflex 1+ bilaterally. Gross sensory exam of BLE normal.   Skin: Skin is warm and dry.  Psychiatric: She has a normal mood and affect. Judgment normal.  Nursing note and vitals reviewed.    ED Treatments / Results  DIAGNOSTIC STUDIES: Oxygen Saturation is 98% on RA, normal by my interpretation.   COORDINATION OF CARE: 1:13 PM- Instructed pt to do specific exercises to strengthen her core and back. Encouraged pt to take OTC Tylenol, Motrin/Aleve around the clock. Will prescribe muscle relaxer. Advised pt that imaging is not indicated at this time but pt requested it. Pt verbalizes understanding and agrees to plan.  Medications  naproxen (NAPROSYN) tablet 500 mg (500 mg Oral Given 05/17/16 1327)  methocarbamol (ROBAXIN) tablet 500 mg (500 mg Oral Given 05/17/16 1327)  acetaminophen (TYLENOL) tablet 650 mg (650 mg Oral Given 05/17/16 1327)    Labs (all labs ordered are listed, but only abnormal results are displayed) Labs Reviewed  POC URINE PREG, ED    EKG  EKG Interpretation None       Radiology Dg Lumbar Spine Complete  Result Date: 05/17/2016 CLINICAL DATA:  Patient reports she has had back pain most of her life, reports it has progressively gotten worse in the last 2 weeks, more severe  yesterday and today, patient reports the pain is on the middle of her back and radiates to the sides, she denies pain in either of her legs but reports she starts to have pain in her back when she tries to life her legs. Reports she was diagnosed with moderate scoliosis when she was younger.No known injury EXAM: LUMBAR SPINE - COMPLETE 4+ VIEW COMPARISON:  None. FINDINGS: There is no evidence of lumbar spine fracture. Alignment is normal. Intervertebral disc spaces are maintained. IMPRESSION: Negative. Electronically Signed   By: Lajean Manes M.D.   On: 05/17/2016 14:41    Procedures Procedures (including critical care time)  Medications Ordered in ED Medications  naproxen (NAPROSYN) tablet 500 mg (500 mg Oral Given 05/17/16 1327)  methocarbamol (ROBAXIN) tablet 500 mg (500 mg Oral Given 05/17/16 1327)  acetaminophen (TYLENOL) tablet 650 mg (650 mg Oral Given 05/17/16 1327)     Initial Impression / Assessment and Plan / ED Course  I have reviewed the triage vital signs and the nursing notes.  Pertinent labs & imaging results that were available during my care of the patient were reviewed by me and considered in my medical decision making (see chart for details).     Pt has acute worsening of chronic back pain. It seems like she has a lumbar strain or disk disease. Xrays ordered, as pt reports hx of scoliosis - and it actually shows proper allignment of the spine. Pt has no associated numbness, weakness, urinary incontinence, urinary retention, bowel incontinence, pins and needle sensation in the perineal area - so no suspicion of cord  Involvement.  Final Clinical Impressions(s) / ED Diagnoses   Final diagnoses:  Strain of lumbar region, initial encounter    New Prescriptions New Prescriptions   ACETAMINOPHEN (TYLENOL) 325 MG TABLET    Take 2 tablets (650 mg total) by mouth every 6 (six) hours as needed.   IBUPROFEN (ADVIL,MOTRIN) 600 MG TABLET    Take 1 tablet (600 mg total) by  mouth every 6 (six) hours as needed.   METHOCARBAMOL (ROBAXIN) 500 MG TABLET    Take 1 tablet (500 mg total) by mouth 2 (two) times daily.    I personally performed the services described in this documentation, which was scribed in my presence. The recorded information has been reviewed and is accurate.     Varney Biles, MD 05/17/16 1515

## 2016-05-17 NOTE — ED Triage Notes (Signed)
Pt. Stated, I have back pain all my life and I don't have Medicaid since I turned 19 , . For the last 2 weeks My bak is hurting were its hard to get up and down and even ou of bed.

## 2016-05-17 NOTE — Discharge Instructions (Signed)
Use the exercises provided. The xrays were normal - alignment of the back is fine, no signs of arthritis even. You still could have disk disease or strain of the muscles around the spine. Take the medicine provided and strengthen the back further. Also add heat and cold therapy to the back.

## 2016-05-19 ENCOUNTER — Encounter (HOSPITAL_COMMUNITY): Payer: Self-pay | Admitting: Emergency Medicine

## 2016-05-19 ENCOUNTER — Emergency Department (HOSPITAL_COMMUNITY)
Admission: EM | Admit: 2016-05-19 | Discharge: 2016-05-19 | Disposition: A | Discharge status: Home / Self Care | Payer: Medicaid Other | Attending: Physician Assistant | Admitting: Physician Assistant

## 2016-05-19 DIAGNOSIS — M545 Low back pain, unspecified: Secondary | ICD-10-CM

## 2016-05-19 DIAGNOSIS — E039 Hypothyroidism, unspecified: Secondary | ICD-10-CM | POA: Insufficient documentation

## 2016-05-19 DIAGNOSIS — G8929 Other chronic pain: Secondary | ICD-10-CM | POA: Insufficient documentation

## 2016-05-19 DIAGNOSIS — J45909 Unspecified asthma, uncomplicated: Secondary | ICD-10-CM | POA: Insufficient documentation

## 2016-05-19 DIAGNOSIS — Z91040 Latex allergy status: Secondary | ICD-10-CM | POA: Insufficient documentation

## 2016-05-19 DIAGNOSIS — Z79899 Other long term (current) drug therapy: Secondary | ICD-10-CM | POA: Insufficient documentation

## 2016-05-19 MED ORDER — TRAMADOL HCL 50 MG PO TABS: 50.0000 mg | ORAL_TABLET | Freq: Four times a day (QID) | ORAL | 0 refills | Status: DC | PRN

## 2016-05-19 NOTE — ED Triage Notes (Addendum)
Pt c/o of lower back pain with history of the same from scoliosis. Pt reports worsening of pain and until to get into a ortho office due to insurance and lack of refferal. Pt was seen here two days ago and given motrin for pain with no relief.

## 2016-05-19 NOTE — Discharge Instructions (Addendum)
Please follow up with your primary care phsyician. You could try physical therapy. You should return to the ED with fever or weakness.   To find a primary care or specialty doctor please call [phone] or [phone] to access "[facility] a Doctor Service."  You may also go on the [facility] website at [email or web address]  There are also multiple [facility] and [facility] practices throughout the [place] that are frequently accepting new patients. You may find a clinic that is close to your home and contact them.  [facility] -  [address] [place] [address] [phone]  [facility] in [place] (also locations in [place] and [place]) -  [place] [address] [address]  [place] [address] [phone]

## 2016-05-19 NOTE — ED Provider Notes (Signed)
[facility] DEPT Provider Note   CSN: [id number] Arrival date & time: 05/19/16  1110  By signing my name below, I, Levester Fresh, attest that this documentation has been prepared under the direction and in the presence of Caspian Deleonardis Julio Alm, MD.  Electronically Signed: Levester Fresh, Scribe. 05/19/2016. 12:33 PM.  History   Chief Complaint Chief Complaint  Patient presents with  . Back Pain   Kimberly [name] is an obese 26 y.o. female with chronic back pain, depression, anxiety, hypothyroidism, schizophrenia and scoliosis who presents to the ED with complaints of her chronic back pain. Pt had a full work-up for similar complaints 2 days ago (05/17/2016) and was d/c home with a dx of lumbar strain. Back x-ray performed at that time was insignificant.   Pt was given outpatient follow-up, but was unable to go 2/2 lack of insurance.   Pt denies experiencing any other acute sx, including urinary or bowel incontinence, numbness, tingling or weakness. Pt is up and ambulating here in the ED.   The history is provided by the patient and medical records. No language interpreter was used.    Past Medical History:  Diagnosis Date  . Anxiety   . Asthma    "grew out of it" (11/16/2012)  . Chronic back pain   . Complication of anesthesia    "takes a lot to put me to sleep; woke up completely mid endoscopy" (11/16/2012)  . Depression   . Dysrhythmia    ST (11/16/2012)  . Gastric reflux   . 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 Iron deficiency anemia   . Migraines    "weekly" (11/16/2012)  . Ovarian cyst   . Pregnancy induced hypertension   . Schizophrenia ([place])    "moderate" (11/16/2012)  . Scoliosis     Patient Active Problem List   Diagnosis Date Noted  . Anemia 01/16/2016  . Ovarian cyst 04/25/2015  . Rubella non-immune status, antepartum 03/11/2015  . 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 Goiter 03/01/2011  . Acanthosis  nigricans, acquired 03/01/2011  . Oligomenorrhea 03/01/2011  . Hirsutism 03/01/2011  . PCOS (polycystic ovarian syndrome) 03/01/2011  . Hypertension 03/01/2011  . GERD (gastroesophageal reflux disease) 03/01/2011  . Anxiety and depression 03/01/2011  . Obesity 07/03/2010    Past Surgical History:  Procedure Laterality Date  . LAPAROSCOPIC OVARIAN CYSTECTOMY Left 02/11/2016   Procedure: LAPAROSCOPIC OVARIAN CYSTECTOMY;  Surgeon: Emily Filbert, MD;  Location: [facility];  Service: Gynecology;  Laterality: Left;  . UPPER GI ENDOSCOPY  ~ 2006; ~2008    OB History    Gravida Para Term Preterm AB Living   2 1 1   1 1    SAB TAB Ectopic Multiple Live Births   1     0 1       Home Medications    Prior to Admission medications   Medication Sig Start Date End Date Taking? Authorizing Provider  acetaminophen (TYLENOL) 325 MG tablet Take 2 tablets (650 mg total) by mouth every 6 (six) hours as needed. 05/17/16   Varney Biles, MD  diclofenac (VOLTAREN) 50 MG EC tablet Take 1 tablet (50 mg total) by mouth 2 (two) times daily. 02/29/16   Hope Bunnie Pion, NP  FLUoxetine (PROZAC) 20 MG tablet Take 1 tablet (20 mg total) by mouth daily. Patient not taking: Reported on 01/16/2016 08/22/15   Arnoldo Morale, MD  ibuprofen (ADVIL,MOTRIN) 600 MG tablet Take 1 tablet (600 mg total) by mouth  every 6 (six) hours as needed. 05/17/16   Varney Biles, MD  levothyroxine (SYNTHROID, LEVOTHROID) 175 MCG tablet Take 1 tablet (175 mcg total) by mouth daily before breakfast. 11/04/15   Arnoldo Morale, MD  methocarbamol (ROBAXIN) 500 MG tablet Take 1 tablet (500 mg total) by mouth 2 (two) times daily. 05/17/16   Varney Biles, MD  Norgestimate-Ethinyl Estradiol Triphasic (ORTHO TRI-CYCLEN LO) 0.18/0.215/0.25 MG-25 MCG tab Take 1 tablet by mouth daily. Patient not taking: Reported on 01/16/2016 09/18/15   Chancy Milroy, MD  oxyCODONE-acetaminophen (PERCOCET/ROXICET) 5-325 MG tablet Take 1-2 tablets by mouth every 6 (six) hours as  needed. Patient not taking: Reported on 03/01/2016 02/11/16   Emily Filbert, MD  promethazine (PHENERGAN) 25 MG tablet Take 1 tablet (25 mg total) by mouth every 6 (six) hours as needed for nausea or vomiting. 03/01/16   Pattricia Boss, MD  traMADol (ULTRAM) 50 MG tablet Take 1 tablet (50 mg total) by mouth every 6 (six) hours as needed. Patient not taking: Reported on 03/01/2016 12/25/15   Gibson Ramp, MD  traMADol (ULTRAM) 50 MG tablet Take 1 tablet (50 mg total) by mouth every 6 (six) hours as needed. 05/19/16   Najat Olazabal Lyn Danie Hannig, MD    Family History Family History  Problem Relation Age of Onset  . Diabetes Mother   . Hypertension Mother   . Vision loss Mother   . Hypertension Father   . Hyperlipidemia Father   . Thyroid disease Maternal Grandmother     Social History Social History  Substance Use Topics  . Smoking status: Never Smoker  . Smokeless tobacco: Never Used  . Alcohol use Yes     Comment: very rare     Allergies   Sulfa antibiotics; Abilify [aripiprazole]; Amoxicillin-pot clavulanate; Cephalexin; Ferrous sulfate; Latex; Tape; Zicam cold remedy [erysidoron #1]; Keflex [cephalexin]; Percocet [oxycodone-acetaminophen]; and Vicodin [hydrocodone-acetaminophen]   Review of Systems Review of Systems  Constitutional: Negative for fever.  Musculoskeletal: Positive for back pain.  Neurological: Negative for weakness and numbness.     Physical Exam Updated Vital Signs BP 116/82 (BP Location: Right Arm)   Pulse 97   Temp 99.1 F (37.3 C) (Oral)   Resp 19   LMP 04/19/2016   SpO2 98%   Physical Exam  Constitutional: She is oriented to person, place, and time. She appears well-developed and well-nourished.  Obese 26 year old female.  HENT:  Head: Normocephalic and atraumatic.  Eyes: Right eye exhibits no discharge.  Cardiovascular: Normal rate.   Pulmonary/Chest: Effort normal.  Musculoskeletal:  Full range of motion and strength in bilateral lower extremity sent  upper extremities. No physical signs of trauma. No erythema. Patient has normal sensation.  Neurological: She is oriented to person, place, and time.  Skin: Skin is warm and dry. She is not diaphoretic.  Psychiatric: She has a normal mood and affect.  Nursing note and vitals reviewed.    ED Treatments / Results  DIAGNOSTIC STUDIES: Oxygen Saturation is 98% on RA, nl by my interpretation.    COORDINATION OF CARE: 12:15 PM Discussed treatment plan with pt at bedside and pt agreed to plan.   Labs (all labs ordered are listed, but only abnormal results are displayed) Labs Reviewed - No data to display  EKG  EKG Interpretation None       Radiology Dg Lumbar Spine Complete  Result Date: 05/17/2016 CLINICAL DATA:  Patient reports she has had back pain most of her life, reports it has progressively gotten worse in  the last 2 weeks, more severe yesterday and today, patient reports the pain is on the middle of her back and radiates to the sides, she denies pain in either of her legs but reports she starts to have pain in her back when she tries to life her legs. Reports she was diagnosed with moderate scoliosis when she was younger.No known injury EXAM: LUMBAR SPINE - COMPLETE 4+ VIEW COMPARISON:  None. FINDINGS: There is no evidence of lumbar spine fracture. Alignment is normal. Intervertebral disc spaces are maintained. IMPRESSION: Negative. Electronically Signed   By: Lajean Manes M.D.   On: 05/17/2016 14:41    Procedures Procedures (including critical care time)  Medications Ordered in ED Medications - No data to display   Initial Impression / Assessment and Plan / ED Course  I have reviewed the triage vital signs and the nursing notes.  Pertinent labs & imaging results that were available during my care of the patient were reviewed by me and considered in my medical decision making (see chart for details).      patient is well-appearing 26 year old female presenting with  back pain. Patient seen 2 days ago prior for the same. The patient denies any changes insymptoms.no red flags. Normal physical exam. Again reiterated stretching, physical therapy. We'll have her follow-up with her primary care for physical therapy.  Pain is worse with movement. But with no weakness. I doubt any kind of cord injury, or back injury requiring any surgery giving them is atraumatic, no red flags, no fever. We'll continue to have her follow-up for symptomatic care with primary care physician and continue to try the referral. There is nothing that we can do here about it requiring cash as a first time visit, but we will encourage her to discuss with the social worker at  [facility] wellness about this. Final Clinical Impressions(s) / ED Diagnoses   Final diagnoses:  Chronic bilateral low back pain without sciatica    New Prescriptions New Prescriptions   TRAMADOL (ULTRAM) 50 MG TABLET    Take 1 tablet (50 mg total) by mouth every 6 (six) hours as needed.   I personally performed the services described in this documentation, which was scribed in my presence. The recorded information has been reviewed and is accurate.      Zella Dewan Julio Alm, MD 05/19/16 1241

## 2016-05-20 ENCOUNTER — Ambulatory Visit: Payer: Self-pay | Attending: Family Medicine | Admitting: Family Medicine

## 2016-05-20 ENCOUNTER — Encounter: Payer: Self-pay | Admitting: Family Medicine

## 2016-05-20 ENCOUNTER — Encounter: Payer: Self-pay | Admitting: *Deleted

## 2016-05-20 DIAGNOSIS — F419 Anxiety disorder, unspecified: Secondary | ICD-10-CM | POA: Insufficient documentation

## 2016-05-20 DIAGNOSIS — M545 Low back pain: Secondary | ICD-10-CM | POA: Insufficient documentation

## 2016-05-20 DIAGNOSIS — E071 Dyshormogenetic goiter: Secondary | ICD-10-CM

## 2016-05-20 DIAGNOSIS — G8929 Other chronic pain: Secondary | ICD-10-CM | POA: Insufficient documentation

## 2016-05-20 DIAGNOSIS — M549 Dorsalgia, unspecified: Secondary | ICD-10-CM

## 2016-05-20 DIAGNOSIS — E039 Hypothyroidism, unspecified: Secondary | ICD-10-CM | POA: Insufficient documentation

## 2016-05-20 DIAGNOSIS — F32A Depression, unspecified: Secondary | ICD-10-CM

## 2016-05-20 DIAGNOSIS — Z79899 Other long term (current) drug therapy: Secondary | ICD-10-CM | POA: Insufficient documentation

## 2016-05-20 DIAGNOSIS — F329 Major depressive disorder, single episode, unspecified: Secondary | ICD-10-CM | POA: Insufficient documentation

## 2016-05-20 MED ORDER — LEVOTHYROXINE SODIUM 175 MCG PO TABS: 175.0000 ug | ORAL_TABLET | Freq: Every day | ORAL | 3 refills | Status: DC

## 2016-05-20 MED ORDER — METHOCARBAMOL 500 MG PO TABS: 500.0000 mg | ORAL_TABLET | Freq: Two times a day (BID) | ORAL | 3 refills | Status: DC

## 2016-05-20 MED ORDER — FLUOXETINE HCL 20 MG PO TABS: 20.0000 mg | ORAL_TABLET | Freq: Every day | ORAL | 3 refills | Status: DC

## 2016-05-20 MED ORDER — TRAMADOL HCL 50 MG PO TABS: 50.0000 mg | ORAL_TABLET | Freq: Two times a day (BID) | ORAL | 1 refills | Status: DC | PRN

## 2016-05-20 MED ORDER — KETOROLAC TROMETHAMINE 60 MG/2ML IM SOLN
60.0000 mg | Freq: Once | INTRAMUSCULAR | Status: AC
  Administered 2016-05-20: 60 mg via INTRAMUSCULAR

## 2016-05-20 MED FILL — FLUoxetine HCL 20 MG CAPS: 20 | 30 days supply | Qty: 30 | Fill #0

## 2016-05-20 MED FILL — ?LEVOTHYROXINE 175 MCG TAB: 175 | 30 days supply | Qty: 30 | Fill #0

## 2016-05-20 MED FILL — METHOCARBAMOL 500 MG TABLET: 500 | 30 days supply | Qty: 60 | Fill #0

## 2016-05-20 MED FILL — traMADol HCL 50 MG TABS: 50 | 30 days supply | Qty: 60 | Fill #0

## 2016-05-20 NOTE — Progress Notes (Signed)
F/U Chronic back pain No Hx accidents  Pain scale #6 No tobacco user  No suicidal thoughts in the past two weeks

## 2016-05-20 NOTE — Progress Notes (Signed)
Subjective:  Patient ID: Kimberly [name], female    DOB: August 12, 1990  Age: 26 y.o. MRN: [id number]  CC: Back Pain   HPI Kimberly [name]  is a 26 year old female with a history of anxiety and depression, hypothyroidism who presents today for follow-up visit.  She was seen at the ED yesterday for low back pain; x-ray revealed no evidence of lumbar spine fracture, alignment is normal, intervertebral disc spaces are maintained. She was prescribed Robaxin and told she may need an MRI.  Pain is chronic and has been present for months; worse when she does heavy lifting which she does every day at her job at a [employer]. Back pain flared up 4 days ago with radiation up to her thoracic region and she has had to take leftover Percocets which she had from her ovarian cyst surgery. Denies numbness in lower extremities, weakness in legs, falls or loss of sphincteric function  She has been out of her Prozac for depression and also out of levothyroxine  Past Medical History:  Diagnosis Date  . Anxiety   . Asthma    "grew out of it" (11/16/2012)  . Chronic back pain   . Complication of anesthesia    "takes a lot to put me to sleep; woke up completely mid endoscopy" (11/16/2012)  . Depression   . Dysrhythmia    ST (11/16/2012)  . Gastric reflux   . 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 Iron deficiency anemia   . Migraines    "weekly" (11/16/2012)  . Ovarian cyst   . Pregnancy induced hypertension   . Schizophrenia ([place])    "moderate" (11/16/2012)  . Scoliosis     Past Surgical History:  Procedure Laterality Date  . LAPAROSCOPIC OVARIAN CYSTECTOMY Left 02/11/2016   Procedure: LAPAROSCOPIC OVARIAN CYSTECTOMY;  Surgeon: Emily Filbert, MD;  Location: [facility];  Service: Gynecology;  Laterality: Left;  . UPPER GI ENDOSCOPY  ~ 2006; ~2008     Outpatient Medications Prior to Visit  Medication Sig Dispense Refill  . acetaminophen (TYLENOL) 325 MG tablet Take 2  tablets (650 mg total) by mouth every 6 (six) hours as needed. 30 tablet 1  . ibuprofen (ADVIL,MOTRIN) 600 MG tablet Take 1 tablet (600 mg total) by mouth every 6 (six) hours as needed. (Patient not taking: Reported on 05/20/2016) 30 tablet 0  . Norgestimate-Ethinyl Estradiol Triphasic (ORTHO TRI-CYCLEN LO) 0.18/0.215/0.25 MG-25 MCG tab Take 1 tablet by mouth daily. (Patient not taking: Reported on 01/16/2016) 1 Package 11  . oxyCODONE-acetaminophen (PERCOCET/ROXICET) 5-325 MG tablet Take 1-2 tablets by mouth every 6 (six) hours as needed. (Patient not taking: Reported on 03/01/2016) 30 tablet 0  . diclofenac (VOLTAREN) 50 MG EC tablet Take 1 tablet (50 mg total) by mouth 2 (two) times daily. (Patient not taking: Reported on 05/20/2016) 15 tablet 0  . FLUoxetine (PROZAC) 20 MG tablet Take 1 tablet (20 mg total) by mouth daily. (Patient not taking: Reported on 01/16/2016) 30 tablet 3  . levothyroxine (SYNTHROID, LEVOTHROID) 175 MCG tablet Take 1 tablet (175 mcg total) by mouth daily before breakfast. (Patient not taking: Reported on 05/20/2016) 30 tablet 2  . methocarbamol (ROBAXIN) 500 MG tablet Take 1 tablet (500 mg total) by mouth 2 (two) times daily. (Patient not taking: Reported on 05/20/2016) 20 tablet 0  . promethazine (PHENERGAN) 25 MG tablet Take 1 tablet (25 mg total) by mouth every 6 (six) hours as needed for nausea or vomiting. (Patient not taking: Reported on  05/20/2016) 30 tablet 0  . traMADol (ULTRAM) 50 MG tablet Take 1 tablet (50 mg total) by mouth every 6 (six) hours as needed. (Patient not taking: Reported on 03/01/2016) 6 tablet 0  . traMADol (ULTRAM) 50 MG tablet Take 1 tablet (50 mg total) by mouth every 6 (six) hours as needed. (Patient not taking: Reported on 05/20/2016) 7 tablet 0   No facility-administered medications prior to visit.     ROS Review of Systems  Constitutional: Negative for activity change, appetite change and fatigue.  HENT: Negative for congestion, sinus pressure and  sore throat.   Eyes: Negative for visual disturbance.  Respiratory: Negative for cough, chest tightness, shortness of breath and wheezing.   Cardiovascular: Negative for chest pain and palpitations.  Gastrointestinal: Negative for abdominal distention, abdominal pain and constipation.  Endocrine: Negative for polydipsia.  Genitourinary: Negative for dysuria and frequency.  Musculoskeletal: Positive for back pain. Negative for arthralgias.  Skin: Negative for rash.  Neurological: Negative for tremors, light-headedness and numbness.  Hematological: Does not bruise/bleed easily.  Psychiatric/Behavioral: Negative for agitation and behavioral problems.    Objective:  BP 122/81 (BP Location: Right Arm, Patient Position: Sitting, Cuff Size: Large)   Pulse 97   Temp 99.1 F (37.3 C)   Resp 18   Ht 5' 4.5" (1.638 m)   Wt 248 lb (112.5 kg)   LMP 04/20/2016   SpO2 98%   BMI 41.91 kg/m   BP/Weight 05/20/2016 05/19/2016 07/05/735  Systolic BP 106 269 485  Diastolic BP 81 82 91  Wt. (Lbs) 248 - 220  BMI 41.91 - 37.18  Some encounter information is confidential and restricted. Go to Review Flowsheets activity to see all data.      Physical Exam  Constitutional: She is oriented to person, place, and time. She appears well-developed and well-nourished.  Cardiovascular: Normal rate, normal heart sounds and intact distal pulses.   No murmur heard. Pulmonary/Chest: Effort normal and breath sounds normal. She has no wheezes. She has no rales. She exhibits no tenderness.  Abdominal: Soft. Bowel sounds are normal. She exhibits no distension and no mass. There is no tenderness.  Musculoskeletal: Normal range of motion. She exhibits tenderness (tenderness on palpation of bilateral aspects of lumbar spine; tenderness elicited on maintaining supine position. Positive straight leg bilaterally.).  Neurological: She is alert and oriented to person, place, and time.  Skin: Skin is warm.  Psychiatric: She  has a normal mood and affect.     Assessment & Plan:   1. 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  Dispense: 30 tablet; Refill: 3  2. Anxiety and depression Uncontrolled due to running out of medications - FLUoxetine (PROZAC) 20 MG tablet; Take 1 tablet (20 mg total) by mouth daily.  Dispense: 30 tablet; Refill: 3  3. Chronic bilateral low back pain without sciatica Uncontrolled 60 mg Toradol administered in the clinic She will need MRI of the spine-advised to apply for [place] discount to facilitate this referral. - methocarbamol (ROBAXIN) 500 MG tablet; Take 1 tablet (500 mg total) by mouth 2 (two) times daily.  Dispense: 60 tablet; Refill: 3 - traMADol (ULTRAM) 50 MG tablet; Take 1 tablet (50 mg total) by mouth every 12 (twelve) hours as needed.  Dispense: 60 tablet; Refill: 1   Meds ordered this encounter  Medications  . levothyroxine (SYNTHROID, LEVOTHROID) 175 MCG tablet    Sig: Take 1 tablet (175 mcg total) by mouth daily before breakfast.    Dispense:  30 tablet    Refill:  3    Discontinue previous dose  . FLUoxetine (PROZAC) 20 MG tablet    Sig: Take 1 tablet (20 mg total) by mouth daily.    Dispense:  30 tablet    Refill:  3  . methocarbamol (ROBAXIN) 500 MG tablet    Sig: Take 1 tablet (500 mg total) by mouth 2 (two) times daily.    Dispense:  60 tablet    Refill:  3  . traMADol (ULTRAM) 50 MG tablet    Sig: Take 1 tablet (50 mg total) by mouth every 12 (twelve) hours as needed.    Dispense:  60 tablet    Refill:  1    Follow-up: Return in about 3 weeks (around 06/10/2016) for follow up on back pain.   Arnoldo Morale MD

## 2016-05-22 ENCOUNTER — Ambulatory Visit: Payer: Self-pay | Attending: Internal Medicine

## 2016-06-02 ENCOUNTER — Telehealth: Payer: Self-pay | Admitting: Family Medicine

## 2016-06-02 NOTE — Telephone Encounter (Signed)
Patient was approved for 100% [facility] discount , pt would like to be scheduled an MRI, please f/up

## 2016-06-02 NOTE — Telephone Encounter (Signed)
What is patient needing an MRI of. If she has not seen a PCP for the concern she must make an OV for evaluation.

## 2016-06-08 ENCOUNTER — Ambulatory Visit: Payer: Self-pay | Attending: Family Medicine

## 2016-06-08 ENCOUNTER — Other Ambulatory Visit: Payer: Self-pay | Admitting: Family Medicine

## 2016-06-08 DIAGNOSIS — Z13228 Encounter for screening for other metabolic disorders: Secondary | ICD-10-CM

## 2016-06-08 DIAGNOSIS — E071 Dyshormogenetic goiter: Secondary | ICD-10-CM | POA: Insufficient documentation

## 2016-06-08 NOTE — Progress Notes (Signed)
Patient here for lab visit only 

## 2016-06-09 ENCOUNTER — Emergency Department (HOSPITAL_COMMUNITY)
Admission: EM | Admit: 2016-06-09 | Discharge: 2016-06-09 | Disposition: A | Discharge status: Home / Self Care | Payer: Self-pay | Attending: Emergency Medicine | Admitting: Emergency Medicine

## 2016-06-09 ENCOUNTER — Encounter (HOSPITAL_COMMUNITY): Payer: Self-pay | Admitting: Physician Assistant

## 2016-06-09 DIAGNOSIS — Z79899 Other long term (current) drug therapy: Secondary | ICD-10-CM | POA: Insufficient documentation

## 2016-06-09 DIAGNOSIS — J45909 Unspecified asthma, uncomplicated: Secondary | ICD-10-CM | POA: Insufficient documentation

## 2016-06-09 DIAGNOSIS — Z91040 Latex allergy status: Secondary | ICD-10-CM | POA: Insufficient documentation

## 2016-06-09 DIAGNOSIS — M545 Low back pain: Secondary | ICD-10-CM | POA: Insufficient documentation

## 2016-06-09 DIAGNOSIS — I10 Essential (primary) hypertension: Secondary | ICD-10-CM | POA: Insufficient documentation

## 2016-06-09 DIAGNOSIS — G8929 Other chronic pain: Secondary | ICD-10-CM | POA: Insufficient documentation

## 2016-06-09 LAB — CBC WITH DIFFERENTIAL/PLATELET
BASOS ABS: 0 10*3/uL (ref 0.0–0.1)
Basophils Relative: 0 %
EOS PCT: 2 %
Eosinophils Absolute: 0.2 10*3/uL (ref 0.0–0.7)
HCT: 36.4 % (ref 36.0–46.0)
Hemoglobin: 11.2 g/dL — ABNORMAL LOW (ref 12.0–15.0)
LYMPHS PCT: 28 %
Lymphs Abs: 3.7 10*3/uL (ref 0.7–4.0)
MCH: 24.6 pg — ABNORMAL LOW (ref 26.0–34.0)
MCHC: 30.8 g/dL (ref 30.0–36.0)
MCV: 79.8 fL (ref 78.0–100.0)
Monocytes Absolute: 0.9 10*3/uL (ref 0.1–1.0)
Monocytes Relative: 7 %
Neutro Abs: 8.2 10*3/uL — ABNORMAL HIGH (ref 1.7–7.7)
Neutrophils Relative %: 63 %
PLATELETS: 384 10*3/uL (ref 150–400)
RBC: 4.56 MIL/uL (ref 3.87–5.11)
RDW: 15.6 % — ABNORMAL HIGH (ref 11.5–15.5)
WBC: 12.9 10*3/uL — AB (ref 4.0–10.5)

## 2016-06-09 LAB — URINALYSIS, ROUTINE W REFLEX MICROSCOPIC
BILIRUBIN URINE: NEGATIVE
GLUCOSE, UA: NEGATIVE mg/dL
HGB URINE DIPSTICK: NEGATIVE
Ketones, ur: NEGATIVE mg/dL
NITRITE: NEGATIVE
PH: 5 (ref 5.0–8.0)
Protein, ur: NEGATIVE mg/dL
SPECIFIC GRAVITY, URINE: 1.026 (ref 1.005–1.030)

## 2016-06-09 LAB — CMP14+EGFR
ALK PHOS: 81 IU/L (ref 39–117)
ALT: 12 IU/L (ref 0–32)
AST: 13 IU/L (ref 0–40)
Albumin/Globulin Ratio: 1.7 (ref 1.2–2.2)
Albumin: 4.1 g/dL (ref 3.5–5.5)
BILIRUBIN TOTAL: 0.5 mg/dL (ref 0.0–1.2)
BUN / CREAT RATIO: 17 (ref 9–23)
BUN: 9 mg/dL (ref 6–20)
CHLORIDE: 103 mmol/L (ref 96–106)
CO2: 22 mmol/L (ref 18–29)
CREATININE: 0.53 mg/dL — AB (ref 0.57–1.00)
Calcium: 9 mg/dL (ref 8.7–10.2)
GFR calc Af Amer: 154 mL/min/{1.73_m2} (ref >59)
GFR calc non Af Amer: 133 mL/min/{1.73_m2} (ref >59)
GLUCOSE: 83 mg/dL (ref 65–99)
Globulin, Total: 2.4 g/dL (ref 1.5–4.5)
Potassium: 4.4 mmol/L (ref 3.5–5.2)
Sodium: 140 mmol/L (ref 134–144)
Total Protein: 6.5 g/dL (ref 6.0–8.5)

## 2016-06-09 LAB — LIPID PANEL
CHOLESTEROL TOTAL: 178 mg/dL (ref 100–199)
Chol/HDL Ratio: 4.3 ratio (ref 0.0–4.4)
HDL: 41 mg/dL (ref >39)
LDL CALC: 90 mg/dL (ref 0–99)
Triglycerides: 237 mg/dL — ABNORMAL HIGH (ref 0–149)
VLDL CHOLESTEROL CAL: 47 mg/dL — AB (ref 5–40)

## 2016-06-09 LAB — COMPREHENSIVE METABOLIC PANEL
ALT: 15 U/L (ref 14–54)
ANION GAP: 11 (ref 5–15)
AST: 16 U/L (ref 15–41)
Albumin: 3.9 g/dL (ref 3.5–5.0)
Alkaline Phosphatase: 71 U/L (ref 38–126)
BILIRUBIN TOTAL: 0.5 mg/dL (ref 0.3–1.2)
BUN: 11 mg/dL (ref 6–20)
CO2: 24 mmol/L (ref 22–32)
Calcium: 9.5 mg/dL (ref 8.9–10.3)
Chloride: 104 mmol/L (ref 101–111)
Creatinine, Ser: 0.51 mg/dL (ref 0.44–1.00)
GFR calc Af Amer: >60 mL/min (ref >60)
GFR calc non Af Amer: >60 mL/min (ref >60)
Glucose, Bld: 93 mg/dL (ref 65–99)
POTASSIUM: 4.2 mmol/L (ref 3.5–5.1)
Sodium: 139 mmol/L (ref 135–145)
TOTAL PROTEIN: 7.4 g/dL (ref 6.5–8.1)

## 2016-06-09 LAB — I-STAT BETA HCG BLOOD, ED (MC, WL, AP ONLY)

## 2016-06-09 LAB — TSH: TSH: 0.86 u[IU]/mL (ref 0.450–4.500)

## 2016-06-09 LAB — LIPASE, BLOOD: Lipase: 20 U/L (ref 11–51)

## 2016-06-09 MED ORDER — METHYLPREDNISOLONE 4 MG PO TBPK: ORAL_TABLET | ORAL | 0 refills | Status: DC

## 2016-06-09 MED ORDER — SODIUM CHLORIDE 0.9 % IV BOLUS (SEPSIS): 1000.0000 mL | Freq: Once | INTRAVENOUS | Status: DC

## 2016-06-09 NOTE — Discharge Instructions (Signed)
Please seek additional medical care if you have any of the red flags we discussed.   While in the ED your blood pressure was high.  Please follow up with your primary care doctor or the wellness clinic for repeat evaluation as you may need medication.  High blood pressure can cause long term, potentially serious, damage if left untreated.

## 2016-06-09 NOTE — ED Provider Notes (Signed)
[facility] DEPT Provider Note   CSN: [id number] Arrival date & time: 06/09/16  1509     History   Chief Complaint Chief Complaint  Patient presents with  . Emesis  . Back Pain    HPI Kimberly [name] is a 26 y.o. female with a history of 1.5 years of chronic back pain, anxiety, anemia, schizophrenia who presents with chronic back pain after having difficulties obtaining out patient MRI.  She reports she has been unable to reach her PCP to schedule MRI.  She is here today hoping to obtain a MRI.  She reports that her pain is not changed from her normal, is lumbar with sciatica to right leg.  No nausea, vomiting, fevers or chills.  No changes in bowel or bladder function.  No numbness to upper inner thighs or genitals.  No history of IVDU, fevers (outside of "stomach bug").  Pain is not changed in location or character.    Three days ago she had a "stomach bug" with nausea, vomiting, diarrhea and fevers. She reports that fully resolved, with out incident and she is back to her normal baseline.    HPI  Past Medical History:  Diagnosis Date  . Anxiety   . Asthma    "grew out of it" (11/16/2012)  . Chronic back pain   . Complication of anesthesia    "takes a lot to put me to sleep; woke up completely mid endoscopy" (11/16/2012)  . Depression   . Dysrhythmia    ST (11/16/2012)  . Gastric reflux   . 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 Iron deficiency anemia   . Migraines    "weekly" (11/16/2012)  . Ovarian cyst   . Pregnancy induced hypertension   . Schizophrenia ([place])    "moderate" (11/16/2012)  . Scoliosis     Patient Active Problem List   Diagnosis Date Noted  . Chronic back pain 05/20/2016  . Anemia 01/16/2016  . Ovarian cyst 04/25/2015  . Rubella non-immune status, antepartum 03/11/2015  . 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 Goiter 03/01/2011  . Acanthosis nigricans, acquired 03/01/2011  . Oligomenorrhea 03/01/2011    . Hirsutism 03/01/2011  . PCOS (polycystic ovarian syndrome) 03/01/2011  . Hypertension 03/01/2011  . GERD (gastroesophageal reflux disease) 03/01/2011  . Anxiety and depression 03/01/2011  . Obesity 07/03/2010    Past Surgical History:  Procedure Laterality Date  . LAPAROSCOPIC OVARIAN CYSTECTOMY Left 02/11/2016   Procedure: LAPAROSCOPIC OVARIAN CYSTECTOMY;  Surgeon: Emily Filbert, MD;  Location: [facility];  Service: Gynecology;  Laterality: Left;  . UPPER GI ENDOSCOPY  ~ 2006; ~2008    OB History    Gravida Para Term Preterm AB Living   2 1 1   1 1    SAB TAB Ectopic Multiple Live Births   1     0 1       Home Medications    Prior to Admission medications   Medication Sig Start Date End Date Taking? Authorizing Provider  FLUoxetine (PROZAC) 20 MG tablet Take 1 tablet (20 mg total) by mouth daily. 05/20/16  Yes Arnoldo Morale, MD  levothyroxine (SYNTHROID, LEVOTHROID) 175 MCG tablet Take 1 tablet (175 mcg total) by mouth daily before breakfast. 05/20/16  Yes Amao, Enobong, MD  methocarbamol (ROBAXIN) 500 MG tablet Take 1 tablet (500 mg total) by mouth 2 (two) times daily. 05/20/16  Yes Arnoldo Morale, MD  traMADol (ULTRAM) 50 MG tablet Take 1 tablet (50 mg total) by mouth  every 12 (twelve) hours as needed. 05/20/16  Yes Arnoldo Morale, MD  ibuprofen (ADVIL,MOTRIN) 600 MG tablet Take 1 tablet (600 mg total) by mouth every 6 (six) hours as needed. Patient not taking: Reported on 05/20/2016 05/17/16   Varney Biles, MD  methylPREDNISolone (MEDROL DOSEPAK) 4 MG TBPK tablet Take all tablets at breakfast with food. Day one: take 6 tablets Day two: take 5 tablets Day three: take 4 tablets Day four:take 3 tablets Day five: take two tablets Day six: take one tablet 06/09/16   Lorin Glass, PA-C  Norgestimate-Ethinyl Estradiol Triphasic (ORTHO TRI-CYCLEN LO) 0.18/0.215/0.25 MG-25 MCG tab Take 1 tablet by mouth daily. Patient not taking: Reported on 01/16/2016 09/18/15   Chancy Milroy, MD   oxyCODONE-acetaminophen (PERCOCET/ROXICET) 5-325 MG tablet Take 1-2 tablets by mouth every 6 (six) hours as needed. Patient not taking: Reported on 03/01/2016 02/11/16   Emily Filbert, MD    Family History Family History  Problem Relation Age of Onset  . Diabetes Mother   . Hypertension Mother   . Vision loss Mother   . Hypertension Father   . Hyperlipidemia Father   . Thyroid disease Maternal Grandmother     Social History Social History  Substance Use Topics  . Smoking status: Never Smoker  . Smokeless tobacco: Never Used  . Alcohol use Yes     Comment: very rare     Allergies   Sulfa antibiotics; Abilify [aripiprazole]; Amoxicillin-pot clavulanate; Cephalexin; Ferrous sulfate; Latex; Tape; Zicam cold remedy [erysidoron #1]; Keflex [cephalexin]; Percocet [oxycodone-acetaminophen]; and Vicodin [hydrocodone-acetaminophen]   Review of Systems Review of Systems  Constitutional: Negative for chills, diaphoresis, fatigue and fever.  HENT: Negative for ear pain and sore throat.   Eyes: Negative for visual disturbance.  Respiratory: Negative for cough and shortness of breath.   Cardiovascular: Negative for chest pain and palpitations.  Gastrointestinal: Negative for abdominal pain, constipation, diarrhea, nausea and vomiting.  Endocrine: Negative for polydipsia and polyphagia.  Genitourinary: Negative for decreased urine volume, difficulty urinating, dysuria, hematuria, pelvic pain and urgency.  Musculoskeletal: Positive for back pain. Negative for arthralgias, gait problem, myalgias, neck pain and neck stiffness.  Skin: Negative for color change and rash.  Neurological: Negative for seizures, syncope, light-headedness and headaches.  Hematological: Does not bruise/bleed easily.  All other systems reviewed and are negative.    Physical Exam Updated Vital Signs BP (!) 148/88 (BP Location: Left Wrist)   Pulse 87   Temp 98 F (36.7 C) (Oral)   Resp 14   Ht 5\' 4"  (1.626 m)    Wt 108.9 kg (240 lb)   SpO2 100%   BMI 41.20 kg/m   Physical Exam  Constitutional: She appears well-developed and well-nourished.  HENT:  Head: Normocephalic and atraumatic.  Right Ear: External ear normal.  Left Ear: External ear normal.  Eyes: No scleral icterus.  Neck: Normal range of motion. Neck supple.  Cardiovascular: Normal rate, regular rhythm and normal heart sounds.  Exam reveals no friction rub.   No murmur heard. Pulmonary/Chest: Effort normal and breath sounds normal. No stridor. No respiratory distress.  Abdominal: Soft. Bowel sounds are normal. She exhibits no distension. There is no tenderness.  Musculoskeletal:       Cervical back: Normal.       Thoracic back: Normal.       Lumbar back: She exhibits decreased range of motion, tenderness, bony tenderness and pain. She exhibits no swelling, no edema, no deformity and no spasm.  Back pain radiates down  right leg.  Patient has intact distal pulses.   Neurological: She is alert. She has normal strength. She displays no atrophy. No cranial nerve deficit or sensory deficit. She exhibits normal muscle tone. Coordination and gait normal. GCS eye subscore is 4. GCS verbal subscore is 5. GCS motor subscore is 6.  Patient has intact sensation to touch bilateral lower extremities and upper extremities.  Sensation intact to web space between first and second toe bilaterally.    Skin: Skin is warm and dry. She is not diaphoretic. No erythema. No pallor.  Psychiatric: She has a normal mood and affect. Her behavior is normal.  Nursing note and vitals reviewed.    ED Treatments / Results  Labs (all labs ordered are listed, but only abnormal results are displayed) Labs Reviewed  CBC WITH DIFFERENTIAL/PLATELET - Abnormal; Notable for the following:       Result Value   WBC 12.9 (*)    Hemoglobin 11.2 (*)    MCH 24.6 (*)    RDW 15.6 (*)    Neutro Abs 8.2 (*)    All other components within normal limits  URINALYSIS, ROUTINE W  REFLEX MICROSCOPIC - Abnormal; Notable for the following:    APPearance CLOUDY (*)    Leukocytes, UA LARGE (*)    Bacteria, UA MANY (*)    Squamous Epithelial / LPF TOO NUMEROUS TO COUNT (*)    All other components within normal limits  COMPREHENSIVE METABOLIC PANEL  LIPASE, BLOOD  I-STAT BETA HCG BLOOD, ED ([facility], [facility], [facility] ONLY)    EKG  EKG Interpretation None       Radiology No results found.  Procedures Procedures (including critical care time)  Medications Ordered in ED Medications  sodium chloride 0.9 % bolus 1,000 mL (not administered)     Initial Impression / Assessment and Plan / ED Course  I have reviewed the triage vital signs and the nursing notes.  Pertinent labs & imaging results that were available during my care of the patient were reviewed by me and considered in my medical decision making (see chart for details).    Patient with back pain.  No neurological deficits and normal neuro exam.  Patient can walk but states is painful.  No loss of bowel or bladder control.  No concern for cauda equina.  No fever (outside of gastroenteritis episode which has resolved) , night sweats, weight loss, h/o cancer, IVDU.  RICE protocol and pain medicine indicated and discussed with patient. Patient will be given steroid taper as that has not been tried for her pain yet.  Patient has history of PCOS so blood sugar was obtained prior to steroid.  Patient had basic abdominal pain labs ordered due to reported abdominal pain, on my exam and interview patient reports it was all resolved two days ago.  Patient was informed that a emergent MRI is not appropriate today as she has no neurologic deficits and her pain is unchanged in all natures except severity.  Patient was informed that her PCP needs to be the one to order a MRI and that, if she so desired, she can contact any spine doctor and ask for an appointment.    While in the ED patient was noted to have an elevated blood pressure.  At  this time there is nothing to suggest hypertensive urgency or emergency.  Patient was advised to follow up with their PCP or Wellness clinic regarding their elevated blood pressure.   At this time there does not  appear to be any evidence of an acute emergency medical condition and the patient appears stable for discharge with appropriate outpatient follow up.Diagnosis was discussed with patient who verbalizes understanding and is agreeable to discharge. Pt case discussed with Dr. Leonette Monarch who agrees with my plan.    Final Clinical Impressions(s) / ED Diagnoses   Final diagnoses:  Chronic bilateral low back pain, with sciatica presence unspecified    New Prescriptions New Prescriptions   METHYLPREDNISOLONE (MEDROL DOSEPAK) 4 MG TBPK TABLET    Take all tablets at breakfast with food. Day one: take 6 tablets Day two: take 5 tablets Day three: take 4 tablets Day four:take 3 tablets Day five: take two tablets Day six: take one tablet     Lorin Glass, PA-C 06/09/16 Greenevers, Henrietta, MD 06/10/16 [phone]

## 2016-06-09 NOTE — ED Triage Notes (Signed)
Pt c/o back pain onset after giving birth to son in July 2017. Was evaluated at [facility] x 2, told she may have disk disease, referred for MRI but that she could not afford an MRI where she was sent. Emesis and diarrhea as well.

## 2016-06-10 ENCOUNTER — Telehealth: Payer: Self-pay | Admitting: Family Medicine

## 2016-06-10 DIAGNOSIS — M545 Low back pain: Principal | ICD-10-CM

## 2016-06-10 DIAGNOSIS — G8929 Other chronic pain: Secondary | ICD-10-CM

## 2016-06-10 MED FILL — ?METHYLPREDNISOLONE 4 MG TA: 4 | 6 days supply | Qty: 21 | Fill #0

## 2016-06-10 NOTE — Telephone Encounter (Signed)
Good Morning  Dr Boyd Kerbs Mom called to let you know that they have the [facility]  And if you can place the mri and call her with an appointment . thank You

## 2016-06-12 NOTE — Telephone Encounter (Signed)
Could you please schedule MRI of the lumbar spine for this patient and call her with the appointment? Thank you

## 2016-06-12 NOTE — Telephone Encounter (Signed)
Patient is aware of MRI being scheduled for June 4th at 8:00am with an arrival time at 7:45am. Medical Assistant left message on patient's home and cell voicemail. Voicemail states to give a call back to [place] with [facility] at [phone].

## 2016-06-16 ENCOUNTER — Ambulatory Visit: Payer: Self-pay | Attending: Family Medicine | Admitting: Family Medicine

## 2016-06-16 ENCOUNTER — Encounter: Payer: Self-pay | Admitting: Family Medicine

## 2016-06-16 VITALS — BP 116/81 | HR 122 | Temp 98.9°F | Resp 18 | Ht 64.0 in | Wt 249.2 lb

## 2016-06-16 DIAGNOSIS — R112 Nausea with vomiting, unspecified: Secondary | ICD-10-CM | POA: Insufficient documentation

## 2016-06-16 DIAGNOSIS — F329 Major depressive disorder, single episode, unspecified: Secondary | ICD-10-CM | POA: Insufficient documentation

## 2016-06-16 DIAGNOSIS — M419 Scoliosis, unspecified: Secondary | ICD-10-CM | POA: Insufficient documentation

## 2016-06-16 DIAGNOSIS — K219 Gastro-esophageal reflux disease without esophagitis: Secondary | ICD-10-CM | POA: Insufficient documentation

## 2016-06-16 DIAGNOSIS — F419 Anxiety disorder, unspecified: Secondary | ICD-10-CM | POA: Insufficient documentation

## 2016-06-16 DIAGNOSIS — M545 Low back pain: Secondary | ICD-10-CM | POA: Insufficient documentation

## 2016-06-16 DIAGNOSIS — J45909 Unspecified asthma, uncomplicated: Secondary | ICD-10-CM | POA: Insufficient documentation

## 2016-06-16 DIAGNOSIS — G8929 Other chronic pain: Secondary | ICD-10-CM | POA: Insufficient documentation

## 2016-06-16 DIAGNOSIS — E039 Hypothyroidism, unspecified: Secondary | ICD-10-CM | POA: Insufficient documentation

## 2016-06-16 DIAGNOSIS — N83209 Unspecified ovarian cyst, unspecified side: Secondary | ICD-10-CM | POA: Insufficient documentation

## 2016-06-16 DIAGNOSIS — Z880 Allergy status to penicillin: Secondary | ICD-10-CM | POA: Insufficient documentation

## 2016-06-16 DIAGNOSIS — F209 Schizophrenia, unspecified: Secondary | ICD-10-CM | POA: Insufficient documentation

## 2016-06-16 MED ORDER — OMEPRAZOLE 20 MG PO CPDR: 20.0000 mg | DELAYED_RELEASE_CAPSULE | Freq: Every day | ORAL | 3 refills | Status: DC

## 2016-06-16 MED FILL — ?OMEPRAZOLE DR 20 MG CAPSUL: 20 | 30 days supply | Qty: 30 | Fill #0

## 2016-06-16 NOTE — Patient Instructions (Signed)

## 2016-06-16 NOTE — Progress Notes (Signed)
Patient is here for f/up  Patient complains about back pain

## 2016-06-16 NOTE — Progress Notes (Signed)
Subjective:  Patient ID: Kimberly [name], female    DOB: [birth year]  Age: 26 y.o. MRN: [id number]  CC: Back Pain   HPI Kimberly [name] is a 26 year old female with a history of anxiety and depression, hypothyroidism who presents today for follow-up Of her back pain.  She will need a note for work to excuse her for the next 1 week as she was unable to sit at the desk to receive telephone calls due to worsening of her back pain. The pain prevents her from lifting her toddler son, from prolonged sitting or prolonged standing.  Pain is chronic and has been present for months; worse when she does heavy lifting which she does every day at her job at [employer]. It occurs on her central lumbar spine as well as her paraspinal muscle Denies numbness in lower extremities, weakness in legs, falls or loss of sphincteric function  She also complains of nausea and vomiting which occurs when she has severe back pain and at other times occurs early in the morning; she endorses the presence of reflux as well.  Past Medical History:  Diagnosis Date  . Anxiety   . Asthma    "grew out of it" (11/16/2012)  . Chronic back pain   . Complication of anesthesia    "takes a lot to put me to sleep; woke up completely mid endoscopy" (11/16/2012)  . Depression   . Dysrhythmia    ST (11/16/2012)  . Gastric reflux   . 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 Iron deficiency anemia   . Migraines    "weekly" (11/16/2012)  . Ovarian cyst   . Pregnancy induced hypertension   . Schizophrenia ([place])    "moderate" (11/16/2012)  . Scoliosis     Past Surgical History:  Procedure Laterality Date  . LAPAROSCOPIC OVARIAN CYSTECTOMY Left 02/11/2016   Procedure: LAPAROSCOPIC OVARIAN CYSTECTOMY;  Surgeon: Emily Filbert, MD;  Location: [facility];  Service: Gynecology;  Laterality: Left;  . UPPER GI ENDOSCOPY  ~ 2006; ~2008    Allergies  Allergen Reactions  . Sulfa Antibiotics Swelling  .  Abilify [Aripiprazole] Other (See Comments)    Lethargy, unable to function  . Amoxicillin-Pot Clavulanate Other (See Comments)    Stomach pains Has patient had a PCN reaction causing immediate rash, facial/tongue/throat swelling, SOB or lightheadedness with hypotension: No Has patient had a PCN reaction causing severe rash involving mucus membranes or skin necrosis: No Has patient had a PCN reaction that required hospitalization No Has patient had a PCN reaction occurring within the last 10 years: Yes If all of the above answers are "NO", then may proceed with Cephalosporin use.   . Cephalexin Other (See Comments)    Stomach pains Pt taking Cefadroxil as outpatient  . Ferrous Sulfate Nausea And Vomiting  . Latex Other (See Comments)    Swelling, burning of skin .   Marland Kitchen Tape Other (See Comments)    Swelling, burning of skin .   Marland Kitchen Zicam Cold Remedy [Erysidoron #1] Nausea And Vomiting  . Keflex [Cephalexin] Other (See Comments)    Rash--able to take w/ Benadryl  . Percocet [Oxycodone-Acetaminophen] Other (See Comments)    Per pt- makes her very sleepy, feels like she isn't getting her breath. States she does not have throat/tongue swelling or anaphylaxis.  . Vicodin [Hydrocodone-Acetaminophen] Hives and Rash     Outpatient Medications Prior to Visit  Medication Sig Dispense Refill  . FLUoxetine (PROZAC) 20 MG tablet  Take 1 tablet (20 mg total) by mouth daily. 30 tablet 3  . ibuprofen (ADVIL,MOTRIN) 600 MG tablet Take 1 tablet (600 mg total) by mouth every 6 (six) hours as needed. (Patient not taking: Reported on 05/20/2016) 30 tablet 0  . levothyroxine (SYNTHROID, LEVOTHROID) 175 MCG tablet Take 1 tablet (175 mcg total) by mouth daily before breakfast. 30 tablet 3  . methocarbamol (ROBAXIN) 500 MG tablet Take 1 tablet (500 mg total) by mouth 2 (two) times daily. 60 tablet 3  . methylPREDNISolone (MEDROL DOSEPAK) 4 MG TBPK tablet Take all tablets at breakfast with food. Day one: take 6  tablets Day two: take 5 tablets Day three: take 4 tablets Day four:take 3 tablets Day five: take two tablets Day six: take one tablet 21 tablet 0  . Norgestimate-Ethinyl Estradiol Triphasic (ORTHO TRI-CYCLEN LO) 0.18/0.215/0.25 MG-25 MCG tab Take 1 tablet by mouth daily. (Patient not taking: Reported on 01/16/2016) 1 Package 11  . oxyCODONE-acetaminophen (PERCOCET/ROXICET) 5-325 MG tablet Take 1-2 tablets by mouth every 6 (six) hours as needed. (Patient not taking: Reported on 03/01/2016) 30 tablet 0  . traMADol (ULTRAM) 50 MG tablet Take 1 tablet (50 mg total) by mouth every 12 (twelve) hours as needed. 60 tablet 1   No facility-administered medications prior to visit.     ROS Review of Systems Constitutional: Negative for activity change, appetite change and fatigue.  HENT: Negative for congestion, sinus pressure and sore throat.   Eyes: Negative for visual disturbance.  Respiratory: Negative for cough, chest tightness, shortness of breath and wheezing.   Cardiovascular: Negative for chest pain and palpitations.  Gastrointestinal: Negative for abdominal distention, abdominal pain and constipation. Positive for nausea and reflux  Endocrine: Negative for polydipsia.  Genitourinary: Negative for dysuria and frequency.  Musculoskeletal: Positive for back pain. Negative for arthralgias.  Skin: Negative for rash.  Neurological: Negative for tremors, light-headedness and numbness.  Hematological: Does not bruise/bleed easily.  Psychiatric/Behavioral: Negative for agitation and behavioral problems.  Objective:  BP 116/81 (BP Location: Left Arm, Patient Position: Sitting, Cuff Size: Normal)   Pulse (!) 122   Temp 98.9 F (37.2 C) (Oral)   Resp 18   Ht 5\' 4"  (1.626 m)   Wt 249 lb 3.2 oz (113 kg)   SpO2 98%   BMI 42.78 kg/m   BP/Weight 06/16/2016 1/44/8185 06/22/1495  Systolic BP 026 378 588  Diastolic BP 81 75 81  Wt. (Lbs) 249.2 240 248  BMI 42.78 41.2 41.91  Some encounter  information is confidential and restricted. Go to Review Flowsheets activity to see all data.      Physical Exam Constitutional: She is oriented to person, place, and time. She appears well-developed and well-nourished.  Cardiovascular: Tachycardic rate, normal heart sounds and intact distal pulses.   No murmur heard. Pulmonary/Chest: Effort normal and breath sounds normal. She has no wheezes. She has no rales. She exhibits no tenderness.  Abdominal: Soft. Bowel sounds are normal. She exhibits no distension and no mass. There is no tenderness.  Musculoskeletal: Normal range of motion. She exhibits tenderness (tenderness on palpation of bilateral aspects of lumbar spine; tenderness elicited on maintaining supine position. Positive straight leg bilaterally.).  Neurological: She is alert and oriented to person, place, and time.  Skin: Skin is warm.  Psychiatric: She has a normal mood and affect.   Assessment & Plan:   1. Chronic bilateral low back pain without sciatica Referred for MRI of the lumbosacral spine with upcoming appointment on 06/22/16 Continue Robaxin  and tramadol Apply heat If symptoms persist and MRI is unrevealing she will need to be referred to physical therapy  2. Gastroesophageal reflux disease without esophagitis Avoid late meals - omeprazole (PRILOSEC) 20 MG capsule; Take 1 capsule (20 mg total) by mouth daily.  Dispense: 30 capsule; Refill: 3   Meds ordered this encounter  Medications  . omeprazole (PRILOSEC) 20 MG capsule    Sig: Take 1 capsule (20 mg total) by mouth daily.    Dispense:  30 capsule    Refill:  3    Follow-up: Return in about 3 weeks (around 07/07/2016) for Follow-up on back pain.   Arnoldo Morale MD

## 2016-06-22 ENCOUNTER — Ambulatory Visit (HOSPITAL_COMMUNITY)
Admission: RE | Admit: 2016-06-22 | Discharge: 2016-06-22 | Disposition: A | Discharge status: Home / Self Care | Payer: Self-pay | Source: Ambulatory Visit | Attending: Family Medicine | Admitting: Family Medicine

## 2016-06-22 DIAGNOSIS — G8929 Other chronic pain: Secondary | ICD-10-CM | POA: Insufficient documentation

## 2016-06-22 DIAGNOSIS — M545 Low back pain: Secondary | ICD-10-CM | POA: Insufficient documentation

## 2016-06-22 DIAGNOSIS — M5136 Other intervertebral disc degeneration, lumbar region: Secondary | ICD-10-CM | POA: Insufficient documentation

## 2016-06-22 DIAGNOSIS — M5137 Other intervertebral disc degeneration, lumbosacral region: Secondary | ICD-10-CM | POA: Insufficient documentation

## 2016-06-23 ENCOUNTER — Other Ambulatory Visit: Payer: Self-pay | Admitting: Family Medicine

## 2016-06-23 DIAGNOSIS — M51379 Other intervertebral disc degeneration, lumbosacral region without mention of lumbar back pain or lower extremity pain: Secondary | ICD-10-CM | POA: Insufficient documentation

## 2016-06-23 DIAGNOSIS — M5137 Other intervertebral disc degeneration, lumbosacral region: Secondary | ICD-10-CM

## 2016-06-23 DIAGNOSIS — M5136 Other intervertebral disc degeneration, lumbar region: Secondary | ICD-10-CM | POA: Insufficient documentation

## 2016-06-26 NOTE — Addendum Note (Signed)
Addendum  created 06/26/16 [id number] by Lyn Hollingshead, MD   Sign clinical note

## 2016-06-30 MED FILL — ?LEVOTHYROXINE 175 MCG TAB: 175 | 30 days supply | Qty: 30 | Fill #1

## 2016-06-30 MED FILL — FLUoxetine HCL 20 MG CAPS: 20 | 30 days supply | Qty: 30 | Fill #1

## 2016-07-02 ENCOUNTER — Ambulatory Visit: Payer: Medicaid Other | Attending: Family Medicine | Admitting: Physical Therapy

## 2016-07-07 ENCOUNTER — Ambulatory Visit: Payer: Self-pay | Attending: Family Medicine | Admitting: Family Medicine

## 2016-07-07 VITALS — BP 102/69 | HR 93 | Temp 98.6°F | Resp 18 | Ht 64.0 in | Wt 246.6 lb

## 2016-07-07 DIAGNOSIS — E282 Polycystic ovarian syndrome: Secondary | ICD-10-CM | POA: Insufficient documentation

## 2016-07-07 DIAGNOSIS — M5136 Other intervertebral disc degeneration, lumbar region: Secondary | ICD-10-CM

## 2016-07-07 DIAGNOSIS — F329 Major depressive disorder, single episode, unspecified: Secondary | ICD-10-CM | POA: Insufficient documentation

## 2016-07-07 DIAGNOSIS — F209 Schizophrenia, unspecified: Secondary | ICD-10-CM | POA: Insufficient documentation

## 2016-07-07 DIAGNOSIS — M5137 Other intervertebral disc degeneration, lumbosacral region: Secondary | ICD-10-CM | POA: Insufficient documentation

## 2016-07-07 DIAGNOSIS — K219 Gastro-esophageal reflux disease without esophagitis: Secondary | ICD-10-CM | POA: Insufficient documentation

## 2016-07-07 DIAGNOSIS — Z79899 Other long term (current) drug therapy: Secondary | ICD-10-CM | POA: Insufficient documentation

## 2016-07-07 DIAGNOSIS — Z79891 Long term (current) use of opiate analgesic: Secondary | ICD-10-CM | POA: Insufficient documentation

## 2016-07-07 DIAGNOSIS — E039 Hypothyroidism, unspecified: Secondary | ICD-10-CM | POA: Insufficient documentation

## 2016-07-07 DIAGNOSIS — Z9889 Other specified postprocedural states: Secondary | ICD-10-CM | POA: Insufficient documentation

## 2016-07-07 DIAGNOSIS — F419 Anxiety disorder, unspecified: Secondary | ICD-10-CM | POA: Insufficient documentation

## 2016-07-07 NOTE — Patient Instructions (Signed)

## 2016-07-07 NOTE — Progress Notes (Signed)
Patient is here for f/up back pain

## 2016-07-08 ENCOUNTER — Encounter: Payer: Self-pay | Admitting: Family Medicine

## 2016-07-08 NOTE — Progress Notes (Signed)
Subjective:  Patient ID: Kimberly [name], female    DOB: 14-Feb-1990  Age: 26 y.o. MRN: [id number]  CC: Back Pain   HPI Kimberly [name]  is a 26 year old female with a history of anxiety and depression, hypothyroidism who presents today for follow-up Of her back pain. Pain is chronic and has been present for months; worse when she does heavy lifting. Pain had subsided after last office visit but then returned today.  MRI of the lumbar spine revealed mild disc degeneration at L4-L5 and L5-S1, chronic and stable since 2014. No associated spinal stenosis or neural impingement. I had referred her to physical therapy and she informs me she had received a call to reschedule her appointment and she does not have an upcoming appointment at this time.  She also complains of abdominal bloating and feels like she has another cyst developing in her ovaries. She has a history of PCO S and underwent laparoscopic ovarian cystectomy on 01/2016. LMP 06/21/16.  Past Medical History:  Diagnosis Date  . Anxiety   . Asthma    "grew out of it" (11/16/2012)  . Chronic back pain   . Complication of anesthesia    "takes a lot to put me to sleep; woke up completely mid endoscopy" (11/16/2012)  . Depression   . Dysrhythmia    ST (11/16/2012)  . Gastric reflux   . 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 Iron deficiency anemia   . Migraines    "weekly" (11/16/2012)  . Ovarian cyst   . Pregnancy induced hypertension   . Schizophrenia ([place])    "moderate" (11/16/2012)  . Scoliosis     Past Surgical History:  Procedure Laterality Date  . LAPAROSCOPIC OVARIAN CYSTECTOMY Left 02/11/2016   Procedure: LAPAROSCOPIC OVARIAN CYSTECTOMY;  Surgeon: Emily Filbert, MD;  Location: [facility];  Service: Gynecology;  Laterality: Left;  . UPPER GI ENDOSCOPY  ~ 2006; ~2008     Outpatient Medications Prior to Visit  Medication Sig Dispense Refill  . FLUoxetine (PROZAC) 20 MG tablet Take 1 tablet (20 mg total)  by mouth daily. 30 tablet 3  . ibuprofen (ADVIL,MOTRIN) 600 MG tablet Take 1 tablet (600 mg total) by mouth every 6 (six) hours as needed. (Patient not taking: Reported on 05/20/2016) 30 tablet 0  . levothyroxine (SYNTHROID, LEVOTHROID) 175 MCG tablet Take 1 tablet (175 mcg total) by mouth daily before breakfast. 30 tablet 3  . methocarbamol (ROBAXIN) 500 MG tablet Take 1 tablet (500 mg total) by mouth 2 (two) times daily. 60 tablet 3  . methylPREDNISolone (MEDROL DOSEPAK) 4 MG TBPK tablet Take all tablets at breakfast with food. Day one: take 6 tablets Day two: take 5 tablets Day three: take 4 tablets Day four:take 3 tablets Day five: take two tablets Day six: take one tablet 21 tablet 0  . Norgestimate-Ethinyl Estradiol Triphasic (ORTHO TRI-CYCLEN LO) 0.18/0.215/0.25 MG-25 MCG tab Take 1 tablet by mouth daily. (Patient not taking: Reported on 01/16/2016) 1 Package 11  . omeprazole (PRILOSEC) 20 MG capsule Take 1 capsule (20 mg total) by mouth daily. 30 capsule 3  . oxyCODONE-acetaminophen (PERCOCET/ROXICET) 5-325 MG tablet Take 1-2 tablets by mouth every 6 (six) hours as needed. (Patient not taking: Reported on 03/01/2016) 30 tablet 0  . traMADol (ULTRAM) 50 MG tablet Take 1 tablet (50 mg total) by mouth every 12 (twelve) hours as needed. 60 tablet 1   No facility-administered medications prior to visit.     ROS Review of Systems  Constitutional: Negative for activity change, appetite change and fatigue.  HENT: Negative for congestion, sinus pressure and sore throat.   Eyes: Negative for visual disturbance.  Respiratory: Negative for cough, chest tightness, shortness of breath and wheezing.   Cardiovascular: Negative for chest pain and palpitations.  Gastrointestinal: Negative for abdominal distention, abdominal pain and constipation. Positive for nausea and reflux  Endocrine: Negative for polydipsia.  Genitourinary: Negative for dysuria and frequency.  Musculoskeletal: Positive for back  pain. Negative for arthralgias.  Skin: Negative for rash.  Neurological: Negative for tremors, light-headedness and numbness.  Hematological: Does not bruise/bleed easily.  Psychiatric/Behavioral: Negative for agitation and behavioral problems.  Objective:  BP 102/69 (BP Location: Left Arm, Patient Position: Sitting, Cuff Size: Normal)   Pulse 93   Temp 98.6 F (37 C) (Oral)   Resp 18   Ht 5\' 4"  (1.626 m)   Wt 246 lb 9.6 oz (111.9 kg)   SpO2 99%   BMI 42.33 kg/m   BP/Weight 07/07/2016 06/16/2016 02/07/4172  Systolic BP 081 448 185  Diastolic BP 69 81 75  Wt. (Lbs) 246.6 249.2 240  BMI 42.33 42.78 41.2  Some encounter information is confidential and restricted. Go to Review Flowsheets activity to see all data.      Physical Exam Constitutional: She is oriented to person, place, and time. She appears well-developed and well-nourished.  Cardiovascular: Tachycardic rate, normal heart sounds and intact distal pulses.   No murmur heard. Pulmonary/Chest: Effort normal and breath sounds normal. She has no wheezes. She has no rales. She exhibits no tenderness.  Abdominal: Soft. Bowel sounds are normal. She exhibits no distension and no mass. There is no tenderness.  Musculoskeletal: Normal range of motion. She exhibits tenderness (tenderness on palpation of bilateral aspects of lumbar spine; tenderness elicited on maintaining supine position. Positive straight leg bilaterally.).  Neurological: She is alert and oriented to person, place, and time.  Skin: Skin is warm.  Psychiatric: She has a normal mood and affect.     CLINICAL DATA:  26 year old female with progressive lumbar back pain for 3 months. No known injury. No radiating symptoms.  EXAM: MRI LUMBAR SPINE WITHOUT CONTRAST  TECHNIQUE: Multiplanar, multisequence MR imaging of the lumbar spine was performed. No intravenous contrast was administered.  COMPARISON:  Lumbar radiographs 05/17/2016. CT Abdomen and  Pelvis 11/16/2012  FINDINGS: Segmentation:  Normal as seen on the comparison radiographs.  Alignment: Stable vertebral height and alignment since 2014, normal aside from trace retrolisthesis of L5 on S1.  Vertebrae: No marrow edema or evidence of acute osseous abnormality. Visualized bone marrow signal is within normal limits. Negative visible sacrum and SI joints.  Conus medullaris: Extends to the L1 level and appears normal.  Paraspinal and other soft tissues: Negative.  Disc levels:  T11-T12: Small chronic T12 superior endplate Schmorl node is stable since 2014.  T12-L1:  Negative.  L1-L2:  Negative.  L2-L3:  Negative.  L3-L4:  Negative.  L4-L5: Subtle central disc protrusion (Series 5, image 7 and series 7, image 23). Otherwise normal disc. No stenosis.  L5-S1: Mild disc desiccation. Small central disc protrusion with annular fissure (Series 5, image 7 and series 7, image 30) appears chronic and is probably stable since 2014. No stenosis.  IMPRESSION: 1. Mild disc degeneration at L4-L5 and L5-S1, the latter appears chronic and stable since 2014. No associated spinal stenosis or neural impingement. 2. No significant osseous abnormality.   Electronically Signed   By: Genevie Ann M.D.   On: 06/22/2016  09:15  Assessment & Plan:   1. Degenerative disc disease at L5-S1 level Was referred to physical therapy Advised to contact physical therapy to schedule appointment  2. PCOS (polycystic ovarian syndrome) Status post cystectomy Patient advised to review her menstrual calendar as she could be ovulating at this time which could also explain symptoms Pap smear next visit and will consider pelvic ultrasound if she is still symptomatic.   No orders of the defined types were placed in this encounter.   Follow-up: Return in about 1 month (around 08/06/2016) for PAP smear.   Arnoldo Morale MD

## 2016-07-16 ENCOUNTER — Ambulatory Visit: Payer: Medicaid Other | Admitting: Physical Therapy

## 2016-07-21 ENCOUNTER — Other Ambulatory Visit: Payer: Self-pay | Admitting: Family Medicine

## 2016-07-21 DIAGNOSIS — M545 Low back pain: Principal | ICD-10-CM

## 2016-07-21 DIAGNOSIS — G8929 Other chronic pain: Secondary | ICD-10-CM

## 2016-07-21 MED FILL — traMADol HCL 50 MG TABS: 50 | 30 days supply | Qty: 60 | Fill #1

## 2016-07-29 ENCOUNTER — Ambulatory Visit: Payer: Medicaid Other | Attending: Family Medicine | Admitting: Physical Therapy

## 2016-08-12 ENCOUNTER — Other Ambulatory Visit: Payer: Self-pay | Admitting: Family Medicine

## 2016-09-22 ENCOUNTER — Encounter (HOSPITAL_COMMUNITY): Payer: Self-pay | Admitting: *Deleted

## 2016-09-22 ENCOUNTER — Emergency Department (HOSPITAL_COMMUNITY)
Admission: EM | Admit: 2016-09-22 | Discharge: 2016-09-22 | Disposition: A | Discharge status: Home / Self Care | Payer: Self-pay | Attending: Emergency Medicine | Admitting: Emergency Medicine

## 2016-09-22 DIAGNOSIS — H6690 Otitis media, unspecified, unspecified ear: Secondary | ICD-10-CM

## 2016-09-22 DIAGNOSIS — J45909 Unspecified asthma, uncomplicated: Secondary | ICD-10-CM | POA: Insufficient documentation

## 2016-09-22 DIAGNOSIS — E039 Hypothyroidism, unspecified: Secondary | ICD-10-CM | POA: Insufficient documentation

## 2016-09-22 DIAGNOSIS — H6691 Otitis media, unspecified, right ear: Secondary | ICD-10-CM | POA: Insufficient documentation

## 2016-09-22 DIAGNOSIS — Z79899 Other long term (current) drug therapy: Secondary | ICD-10-CM | POA: Insufficient documentation

## 2016-09-22 DIAGNOSIS — I10 Essential (primary) hypertension: Secondary | ICD-10-CM | POA: Insufficient documentation

## 2016-09-22 DIAGNOSIS — Z91040 Latex allergy status: Secondary | ICD-10-CM | POA: Insufficient documentation

## 2016-09-22 MED ORDER — AZITHROMYCIN 250 MG PO TABS: 250.0000 mg | ORAL_TABLET | Freq: Every day | ORAL | 0 refills | Status: DC

## 2016-09-22 MED ORDER — NEOMYCIN-POLYMYXIN-HC 3.5-10000-1 OT SUSP: 4.0000 [drp] | Freq: Three times a day (TID) | OTIC | 0 refills | Status: DC

## 2016-09-22 MED FILL — ?OMEPRAZOLE DR 20 MG CAPSUL: 20 | 30 days supply | Qty: 30 | Fill #1

## 2016-09-22 MED FILL — AZITHROMYCIN 250 MG TABLET: 250 | 5 days supply | Qty: 6 | Fill #0

## 2016-09-22 MED FILL — FLUoxetine HCL 20 MG CAPS: 20 | 30 days supply | Qty: 30 | Fill #2

## 2016-09-22 MED FILL — NEOMYCIN-POLYMYXIN-HC EAR S: 3.5-10000-1 | 12 days supply | Qty: 10 | Fill #0

## 2016-09-22 MED FILL — LEVOTHYROXINE 175 MCG TAB: 175 | 30 days supply | Qty: 30 | Fill #2

## 2016-09-22 NOTE — ED Notes (Signed)
Pt verbalized understanding of d/c instructions and has no further questions. Pt is stable, A&Ox4, VSS.  

## 2016-09-22 NOTE — ED Provider Notes (Signed)
[facility] DEPT Provider Note   CSN: [id number] Arrival date & time: 09/22/16  0342     History   Chief Complaint Chief Complaint  Patient presents with  . Otalgia  . URI    HPI Kimberly [name] is a 26 y.o. female.  The history is provided by the patient and medical records.     26 y.o. F with hx of anxiety, asthma, chronic back pain, GERD, Fe+ deficiency anemia, schizophrenia, depression, hypothyroidism, presenting to the ED for URI type symptoms for the past few days and worsening riht ear pain.  States her son is a year old and has been sick with URI type symptoms as well.  States she was doing ok until last night and her right ear pain became unbearable.  States ear has been popping and her ear feels clogged with throbbing pain.  Reports fever at home-- has alternated tylenol and motrin for this.  Denies chest pain or SOB.  Past Medical History:  Diagnosis Date  . Anxiety   . Asthma    "grew out of it" (11/16/2012)  . Chronic back pain   . Complication of anesthesia    "takes a lot to put me to sleep; woke up completely mid endoscopy" (11/16/2012)  . Depression   . Dysrhythmia    ST (11/16/2012)  . Gastric reflux   . 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 Iron deficiency anemia   . Migraines    "weekly" (11/16/2012)  . Ovarian cyst   . Pregnancy induced hypertension   . Schizophrenia ([place])    "moderate" (11/16/2012)  . Scoliosis     Patient Active Problem List   Diagnosis Date Noted  . Degenerative disc disease at L5-S1 level 06/23/2016  . Chronic back pain 05/20/2016  . Anemia 01/16/2016  . Ovarian cyst 04/25/2015  . Rubella non-immune status, antepartum 03/11/2015  . 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 Goiter 03/01/2011  . Acanthosis nigricans, acquired 03/01/2011  . Oligomenorrhea 03/01/2011  . Hirsutism 03/01/2011  . PCOS (polycystic ovarian syndrome) 03/01/2011  . Hypertension 03/01/2011  . GERD  (gastroesophageal reflux disease) 03/01/2011  . Anxiety and depression 03/01/2011  . Obesity 07/03/2010    Past Surgical History:  Procedure Laterality Date  . LAPAROSCOPIC OVARIAN CYSTECTOMY Left 02/11/2016   Procedure: LAPAROSCOPIC OVARIAN CYSTECTOMY;  Surgeon: Emily Filbert, MD;  Location: [facility];  Service: Gynecology;  Laterality: Left;  . UPPER GI ENDOSCOPY  ~ 2006; ~2008    OB History    Gravida Para Term Preterm AB Living   2 1 1   1 1    SAB TAB Ectopic Multiple Live Births   1     0 1       Home Medications    Prior to Admission medications   Medication Sig Start Date End Date Taking? Authorizing Provider  FLUoxetine (PROZAC) 20 MG tablet Take 1 tablet (20 mg total) by mouth daily. 05/20/16   Arnoldo Morale, MD  ibuprofen (ADVIL,MOTRIN) 600 MG tablet Take 1 tablet (600 mg total) by mouth every 6 (six) hours as needed. Patient not taking: Reported on 05/20/2016 05/17/16   Varney Biles, MD  levothyroxine (SYNTHROID, LEVOTHROID) 175 MCG tablet Take 1 tablet (175 mcg total) by mouth daily before breakfast. 05/20/16   Arnoldo Morale, MD  methocarbamol (ROBAXIN) 500 MG tablet Take 1 tablet (500 mg total) by mouth 2 (two) times daily. 05/20/16   Arnoldo Morale, MD  methylPREDNISolone (MEDROL DOSEPAK) 4 MG TBPK  tablet Take all tablets at breakfast with food. Day one: take 6 tablets Day two: take 5 tablets Day three: take 4 tablets Day four:take 3 tablets Day five: take two tablets Day six: take one tablet 06/09/16   Lorin Glass, PA-C  Norgestimate-Ethinyl Estradiol Triphasic (ORTHO TRI-CYCLEN LO) 0.18/0.215/0.25 MG-25 MCG tab Take 1 tablet by mouth daily. Patient not taking: Reported on 01/16/2016 09/18/15   Chancy Milroy, MD  omeprazole (PRILOSEC) 20 MG capsule Take 1 capsule (20 mg total) by mouth daily. 06/16/16   Arnoldo Morale, MD  oxyCODONE-acetaminophen (PERCOCET/ROXICET) 5-325 MG tablet Take 1-2 tablets by mouth every 6 (six) hours as needed. Patient not taking: Reported  on 03/01/2016 02/11/16   Emily Filbert, MD  traMADol (ULTRAM) 50 MG tablet Take 1 tablet (50 mg total) by mouth every 12 (twelve) hours as needed. 05/20/16   Arnoldo Morale, MD    Family History Family History  Problem Relation Age of Onset  . Diabetes Mother   . Hypertension Mother   . Vision loss Mother   . Hypertension Father   . Hyperlipidemia Father   . Thyroid disease Maternal Grandmother     Social History Social History  Substance Use Topics  . Smoking status: Never Smoker  . Smokeless tobacco: Never Used  . Alcohol use Yes     Comment: very rare     Allergies   Sulfa antibiotics; Abilify [aripiprazole]; Amoxicillin-pot clavulanate; Cephalexin; Ferrous sulfate; Latex; Tape; Zicam cold remedy [erysidoron #1]; Keflex [cephalexin]; Percocet [oxycodone-acetaminophen]; and Vicodin [hydrocodone-acetaminophen]   Review of Systems Review of Systems  HENT: Positive for ear pain, postnasal drip, rhinorrhea and sore throat.   All other systems reviewed and are negative.    Physical Exam Updated Vital Signs BP 122/73   Pulse (!) 118   Temp 98.2 F (36.8 C) (Oral)   Resp 20   LMP 08/24/2016   SpO2 98%   Physical Exam  Constitutional: She is oriented to person, place, and time. She appears well-developed and well-nourished.  HENT:  Head: Normocephalic and atraumatic.  Right Ear: Tympanic membrane is erythematous. A middle ear effusion is present.  Left Ear: Tympanic membrane and ear canal normal. Tympanic membrane is not erythematous.  No middle ear effusion.  Nose: Mucosal edema and rhinorrhea (clear) present.  Mouth/Throat: Uvula is midline, oropharynx is clear and moist and mucous membranes are normal. No oropharyngeal exudate, posterior oropharyngeal edema, posterior oropharyngeal erythema or tonsillar abscesses.  Right EAC and TM erythematous, no signs of rupture, no mastoid tenderness Tonsils overall normal in appearance bilaterally without exudate; uvula midline  without evidence of peritonsillar abscess; handling secretions appropriately; no difficulty swallowing or speaking; normal phonation without stridor; some PND noted  Eyes: Pupils are equal, round, and reactive to light. Conjunctivae and EOM are normal.  Neck: Normal range of motion.  Cardiovascular: Normal rate, regular rhythm and normal heart sounds.   Pulmonary/Chest: Effort normal and breath sounds normal.  Abdominal: Soft. Bowel sounds are normal.  Musculoskeletal: Normal range of motion.  Neurological: She is alert and oriented to person, place, and time.  Skin: Skin is warm and dry.  Psychiatric: She has a normal mood and affect.  Nursing note and vitals reviewed.    ED Treatments / Results  Labs (all labs ordered are listed, but only abnormal results are displayed) Labs Reviewed - No data to display  EKG  EKG Interpretation None       Radiology No results found.  Procedures Procedures (including critical care  time)  Medications Ordered in ED Medications - No data to display   Initial Impression / Assessment and Plan / ED Course  I have reviewed the triage vital signs and the nursing notes.  Pertinent labs & imaging results that were available during my care of the patient were reviewed by me and considered in my medical decision making (see chart for details).  26 year old female here with a few days of URI symptoms. Son sick at home with similar symptoms.  Reports worsening right ear pain last night. She is afebrile and nontoxic here.  Right TM and EAC erythematous without positive TM rupture. No mastoid tenderness or swelling. Also has some nasal congestion with postnasal drip. No tonsillar edema or exudates. Lungs are clear bilaterally. Patient has multiple antibiotic allergies, will start on azithromycin.  She was given cortisporin drops at her request as well for topical relief.  Recommended close follow-up with PCP.  Discussed plan with patient, she acknowledged  understanding and agreed with plan of care.  Return precautions given for new or worsening symptoms.  Final Clinical Impressions(s) / ED Diagnoses   Final diagnoses:  Ear infection    New Prescriptions New Prescriptions   AZITHROMYCIN (ZITHROMAX) 250 MG TABLET    Take 1 tablet (250 mg total) by mouth daily. Take first 2 tablets together, then 1 every day until finished.   NEOMYCIN-POLYMYXIN-HYDROCORTISONE (CORTISPORIN) 3.5-10000-1 OTIC SUSPENSION    Place 4 drops into the right ear 3 (three) times daily.     Larene Pickett, PA-C 09/22/16 [id number]    Merryl Hacker, MD 09/22/16 [phone]

## 2016-09-22 NOTE — ED Triage Notes (Signed)
Pt c/o R ear pain with URI x 5 days

## 2016-09-22 NOTE — Discharge Instructions (Signed)
Take the prescribed medication as directed. °Follow-up with your primary care doctor if any ongoing issues. °Return to the ED for new or worsening symptoms. °

## 2016-09-30 ENCOUNTER — Other Ambulatory Visit: Payer: Self-pay | Admitting: Family Medicine

## 2016-10-27 ENCOUNTER — Ambulatory Visit: Payer: Self-pay | Admitting: Family Medicine

## 2016-12-15 ENCOUNTER — Ambulatory Visit: Payer: Self-pay | Attending: Family Medicine | Admitting: Family Medicine

## 2016-12-15 ENCOUNTER — Encounter: Payer: Self-pay | Admitting: Family Medicine

## 2016-12-15 VITALS — BP 108/71 | HR 95 | Temp 98.3°F | Ht 64.0 in | Wt 268.4 lb

## 2016-12-15 DIAGNOSIS — R112 Nausea with vomiting, unspecified: Secondary | ICD-10-CM | POA: Insufficient documentation

## 2016-12-15 DIAGNOSIS — E071 Dyshormogenetic goiter: Secondary | ICD-10-CM

## 2016-12-15 DIAGNOSIS — F419 Anxiety disorder, unspecified: Secondary | ICD-10-CM

## 2016-12-15 DIAGNOSIS — E039 Hypothyroidism, unspecified: Secondary | ICD-10-CM | POA: Insufficient documentation

## 2016-12-15 DIAGNOSIS — Z79899 Other long term (current) drug therapy: Secondary | ICD-10-CM | POA: Insufficient documentation

## 2016-12-15 DIAGNOSIS — R197 Diarrhea, unspecified: Secondary | ICD-10-CM

## 2016-12-15 DIAGNOSIS — R350 Frequency of micturition: Secondary | ICD-10-CM

## 2016-12-15 DIAGNOSIS — Z880 Allergy status to penicillin: Secondary | ICD-10-CM | POA: Insufficient documentation

## 2016-12-15 DIAGNOSIS — Z885 Allergy status to narcotic agent status: Secondary | ICD-10-CM | POA: Insufficient documentation

## 2016-12-15 DIAGNOSIS — G8929 Other chronic pain: Secondary | ICD-10-CM

## 2016-12-15 DIAGNOSIS — K219 Gastro-esophageal reflux disease without esophagitis: Secondary | ICD-10-CM

## 2016-12-15 DIAGNOSIS — F329 Major depressive disorder, single episode, unspecified: Secondary | ICD-10-CM | POA: Insufficient documentation

## 2016-12-15 DIAGNOSIS — M545 Low back pain: Secondary | ICD-10-CM | POA: Insufficient documentation

## 2016-12-15 LAB — POCT URINALYSIS DIPSTICK
Bilirubin, UA: NEGATIVE
Glucose, UA: NEGATIVE
Ketones, UA: NEGATIVE
LEUKOCYTES UA: NEGATIVE
NITRITE UA: NEGATIVE
PH UA: 6 (ref 5.0–8.0)
Spec Grav, UA: 1.025 (ref 1.010–1.025)
UROBILINOGEN UA: 0.2 U/dL

## 2016-12-15 LAB — POCT URINE PREGNANCY: PREG TEST UR: NEGATIVE

## 2016-12-15 MED ORDER — DIPHENOXYLATE-ATROPINE 2.5-0.025 MG PO TABS: 1.0000 | ORAL_TABLET | Freq: Three times a day (TID) | ORAL | 0 refills | Status: DC | PRN

## 2016-12-15 MED ORDER — FLUOXETINE HCL 20 MG PO TABS: 20.0000 mg | ORAL_TABLET | Freq: Every day | ORAL | 3 refills | Status: DC

## 2016-12-15 MED ORDER — TRAMADOL HCL 50 MG PO TABS: 50.0000 mg | ORAL_TABLET | Freq: Two times a day (BID) | ORAL | 1 refills | Status: DC | PRN

## 2016-12-15 MED ORDER — OMEPRAZOLE 20 MG PO CPDR: 20.0000 mg | DELAYED_RELEASE_CAPSULE | Freq: Every day | ORAL | 3 refills | Status: DC

## 2016-12-15 MED FILL — ?OMEPRAZOLE DR 20MG CAPSULE: 20 | 30 days supply | Qty: 30 | Fill #2

## 2016-12-15 MED FILL — FLUoxetine HCL 20 MG CAPS: 20 | 30 days supply | Qty: 30 | Fill #3

## 2016-12-15 MED FILL — traMADol HCL 50 MG TABS: 50 | 30 days supply | Qty: 60 | Fill #0

## 2016-12-15 NOTE — Progress Notes (Signed)
Subjective:  Patient ID: Kimberly [name], female    DOB: 04/17/1990  Age: 26 y.o. MRN: [id number]  CC: Urinary Frequency; Nausea; and Emesis   HPI Kimberly [name] is a 26 year old female with a history of anxiety and depression, hypothyroidism, chronic low back pain who presents today for follow-up visit.  She complains of diarrhea, nausea for the last couple of weeks which have not been precipitated by any event.  Diarrhea occurs 3-4 times a day and she denies abdominal pain or cramping. She has vomited in 1-2 occasions and endorses some additional sweats but no fever.  She complains of urinary frequency over the last few days but no dysuria or hematuria.  Her anxiety and depression is controlled on Prozac and she has been compliant with her levothyroxine for hypothyroidism. For her chronic low back pain she has been referred to physical therapy but was unable to make it due to her work schedule.  She is requesting a refill of tramadol which she takes intermittently.  Past Medical History:  Diagnosis Date  . Anxiety   . Asthma    "grew out of it" (11/16/2012)  . Chronic back pain   . Complication of anesthesia    "takes a lot to put me to sleep; woke up completely mid endoscopy" (11/16/2012)  . Depression   . Dysrhythmia    ST (11/16/2012)  . Gastric reflux   . 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 Iron deficiency anemia   . Migraines    "weekly" (11/16/2012)  . Ovarian cyst   . Pregnancy induced hypertension   . Schizophrenia ([place])    "moderate" (11/16/2012)  . Scoliosis     Past Surgical History:  Procedure Laterality Date  . LAPAROSCOPIC OVARIAN CYSTECTOMY Left 02/11/2016   Procedure: LAPAROSCOPIC OVARIAN CYSTECTOMY;  Surgeon: Emily Filbert, MD;  Location: [facility];  Service: Gynecology;  Laterality: Left;  . UPPER GI ENDOSCOPY  ~ 2006; ~2008    Allergies  Allergen Reactions  . Sulfa Antibiotics Swelling  . Abilify [Aripiprazole] Other (See  Comments)    Lethargy, unable to function  . Amoxicillin-Pot Clavulanate Other (See Comments)    Stomach pains Has patient had a PCN reaction causing immediate rash, facial/tongue/throat swelling, SOB or lightheadedness with hypotension: No Has patient had a PCN reaction causing severe rash involving mucus membranes or skin necrosis: No Has patient had a PCN reaction that required hospitalization No Has patient had a PCN reaction occurring within the last 10 years: Yes If all of the above answers are "NO", then may proceed with Cephalosporin use.   . Cephalexin Other (See Comments)    Stomach pains Pt taking Cefadroxil as outpatient  . Ferrous Sulfate Nausea And Vomiting  . Latex Other (See Comments)    Swelling, burning of skin .   Marland Kitchen Tape Other (See Comments)    Swelling, burning of skin .   Marland Kitchen Zicam Cold Remedy [Erysidoron #1] Nausea And Vomiting  . Keflex [Cephalexin] Other (See Comments)    Rash--able to take w/ Benadryl  . Percocet [Oxycodone-Acetaminophen] Other (See Comments)    Per pt- makes her very sleepy, feels like she isn't getting her breath. States she does not have throat/tongue swelling or anaphylaxis.  . Vicodin [Hydrocodone-Acetaminophen] Hives and Rash     Outpatient Medications Prior to Visit  Medication Sig Dispense Refill  . levothyroxine (SYNTHROID, LEVOTHROID) 175 MCG tablet Take 1 tablet (175 mcg total) by mouth daily before breakfast. 30 tablet 3  .  FLUoxetine (PROZAC) 20 MG tablet Take 1 tablet (20 mg total) by mouth daily. 30 tablet 3  . omeprazole (PRILOSEC) 20 MG capsule Take 1 capsule (20 mg total) by mouth daily. 30 capsule 3  . neomycin-polymyxin-hydrocortisone (CORTISPORIN) 3.5-10000-1 OTIC suspension Place 4 drops into the right ear 3 (three) times daily. (Patient not taking: Reported on 12/15/2016) 10 mL 0  . azithromycin (ZITHROMAX) 250 MG tablet Take 1 tablet (250 mg total) by mouth daily. Take first 2 tablets together, then 1 every day until  finished. (Patient not taking: Reported on 12/15/2016) 6 tablet 0  . ibuprofen (ADVIL,MOTRIN) 600 MG tablet Take 1 tablet (600 mg total) by mouth every 6 (six) hours as needed. (Patient not taking: Reported on 05/20/2016) 30 tablet 0  . methocarbamol (ROBAXIN) 500 MG tablet Take 1 tablet (500 mg total) by mouth 2 (two) times daily. (Patient not taking: Reported on 12/15/2016) 60 tablet 3  . methylPREDNISolone (MEDROL DOSEPAK) 4 MG TBPK tablet Take all tablets at breakfast with food. Day one: take 6 tablets Day two: take 5 tablets Day three: take 4 tablets Day four:take 3 tablets Day five: take two tablets Day six: take one tablet (Patient not taking: Reported on 12/15/2016) 21 tablet 0  . Norgestimate-Ethinyl Estradiol Triphasic (ORTHO TRI-CYCLEN LO) 0.18/0.215/0.25 MG-25 MCG tab Take 1 tablet by mouth daily. (Patient not taking: Reported on 01/16/2016) 1 Package 11  . oxyCODONE-acetaminophen (PERCOCET/ROXICET) 5-325 MG tablet Take 1-2 tablets by mouth every 6 (six) hours as needed. (Patient not taking: Reported on 03/01/2016) 30 tablet 0  . traMADol (ULTRAM) 50 MG tablet Take 1 tablet (50 mg total) by mouth every 12 (twelve) hours as needed. (Patient not taking: Reported on 12/15/2016) 60 tablet 1   No facility-administered medications prior to visit.     ROS Review of Systems  Constitutional: Negative for activity change, appetite change and fatigue.  HENT: Negative for congestion, sinus pressure and sore throat.   Eyes: Negative for visual disturbance.  Respiratory: Negative for cough, chest tightness, shortness of breath and wheezing.   Cardiovascular: Negative for chest pain and palpitations.  Gastrointestinal: Positive for diarrhea and nausea. Negative for abdominal distention, abdominal pain and constipation.  Endocrine: Negative for polydipsia.  Genitourinary: Negative for dysuria, frequency and urgency.  Musculoskeletal: Negative for arthralgias and back pain.  Skin: Negative for  rash.  Neurological: Negative for tremors, light-headedness and numbness.  Hematological: Does not bruise/bleed easily.  Psychiatric/Behavioral: Negative for agitation and behavioral problems.    Objective:  BP 108/71   Pulse 95   Temp 98.3 F (36.8 C) (Oral)   Ht 5\' 4"  (1.626 m)   Wt 268 lb 6.4 oz (121.7 kg)   LMP 11/27/2016   SpO2 97%   BMI 46.07 kg/m   BP/Weight 12/15/2016 09/22/2016 09/17/5619  Systolic BP 308 657 846  Diastolic BP 71 73 69  Wt. (Lbs) 268.4 - 246.6  BMI 46.07 - 42.33  Some encounter information is confidential and restricted. Go to Review Flowsheets activity to see all data.      Physical Exam  Constitutional: She is oriented to person, place, and time. She appears well-developed and well-nourished.  Cardiovascular: Normal rate, normal heart sounds and intact distal pulses.  No murmur heard. Pulmonary/Chest: Effort normal and breath sounds normal. She has no wheezes. She has no rales. She exhibits no tenderness.  Abdominal: Soft. Bowel sounds are normal. She exhibits no distension and no mass. There is no tenderness.  Musculoskeletal: Normal range of motion.  Neurological:  She is alert and oriented to person, place, and time.  Skin: Skin is warm and dry.  Psychiatric: She has a normal mood and affect.     Assessment & Plan:   1. Anxiety and depression Controlled - FLUoxetine (PROZAC) 20 MG tablet; Take 1 tablet (20 mg total) by mouth daily.  Dispense: 30 tablet; Refill: 3  2. Gastroesophageal reflux disease without esophagitis She does have some nausea and occasional vomiting May need to be checked for Helicobacter pylori if symptoms persist - omeprazole (PRILOSEC) 20 MG capsule; Take 1 capsule (20 mg total) by mouth daily.  Dispense: 30 capsule; Refill: 3  3. Chronic bilateral low back pain without sciatica Stable, no acute exacerbation Referred for physical therapy which she was unable to undergo due to her work schedule - traMADol (ULTRAM)  50 MG tablet; Take 1 tablet (50 mg total) by mouth every 12 (twelve) hours as needed.  Dispense: 60 tablet; Refill: 1  4. 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 Diarrhea, unspecified type We will need to exclude hyperthyroidism and so TSH has been sent Prolonged duration with low suspicion for viral etiology She is unable to provide a specimen for ova and parasites If symptoms persist will treat for IBS - diphenoxylate-atropine (LOMOTIL) 2.5-0.025 MG tablet; Take 1 tablet by mouth 3 (three) times daily as needed for diarrhea or loose stools.  Dispense: 30 tablet; Refill: 0  6. Urinary frequency UA is negative for UTI Pregnancy test is negative - POCT urinalysis dipstick   Meds ordered this encounter  Medications  . FLUoxetine (PROZAC) 20 MG tablet    Sig: Take 1 tablet (20 mg total) by mouth daily.    Dispense:  30 tablet    Refill:  3  . omeprazole (PRILOSEC) 20 MG capsule    Sig: Take 1 capsule (20 mg total) by mouth daily.    Dispense:  30 capsule    Refill:  3  . traMADol (ULTRAM) 50 MG tablet    Sig: Take 1 tablet (50 mg total) by mouth every 12 (twelve) hours as needed.    Dispense:  60 tablet    Refill:  1  . diphenoxylate-atropine (LOMOTIL) 2.5-0.025 MG tablet    Sig: Take 1 tablet by mouth 3 (three) times daily as needed for diarrhea or loose stools.    Dispense:  30 tablet    Refill:  0    Follow-up: Return in about 3 months (around 03/17/2017) for follow up of hypothyroidism and back pain.   Arnoldo Morale MD

## 2016-12-16 ENCOUNTER — Other Ambulatory Visit: Payer: Self-pay | Admitting: Family Medicine

## 2016-12-16 DIAGNOSIS — E071 Dyshormogenetic goiter: Secondary | ICD-10-CM

## 2016-12-16 LAB — TSH: TSH: 16.92 u[IU]/mL — ABNORMAL HIGH (ref 0.450–4.500)

## 2016-12-16 MED ORDER — LEVOTHYROXINE SODIUM 200 MCG PO TABS
200.0000 ug | ORAL_TABLET | Freq: Every day | ORAL | 3 refills | Status: DC
Start: 2016-12-16 — End: 2017-08-30

## 2016-12-16 MED FILL — ?LEVOTHYROXINE 200 MCG TAB: 200 MCG | 30 days supply | Qty: 30 | Fill #0

## 2016-12-17 IMAGING — CR DG CHEST 2V
2 series · 2 of 2 positions shown · non-contrast
Comparison: Radiograph dated 11/16/2012

CLINICAL DATA: 23-year-old female with nausea vomiting and
diarrhea. Sore throat and fever.

EXAM:
CHEST  2 VIEW

[chest pa]
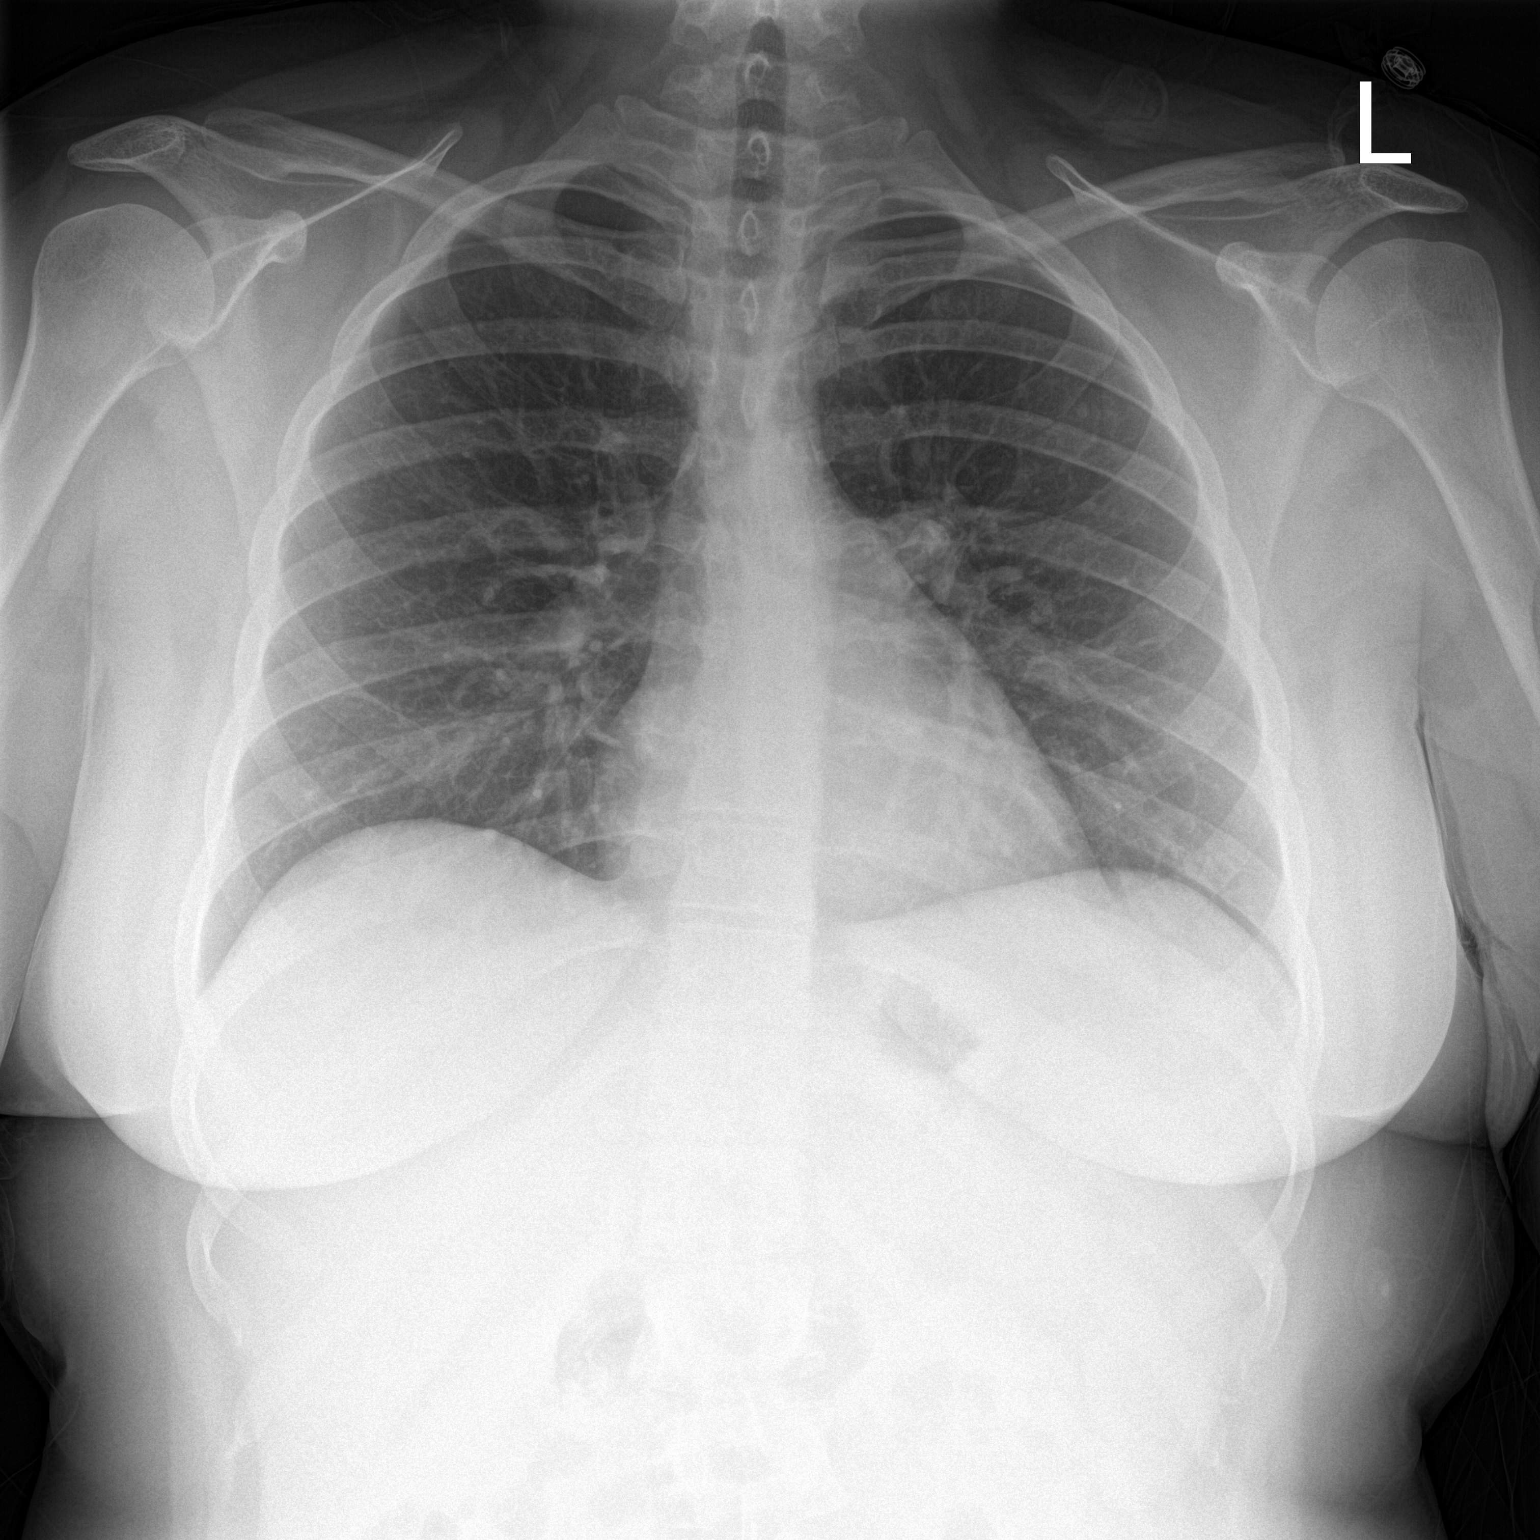

[chest lat]
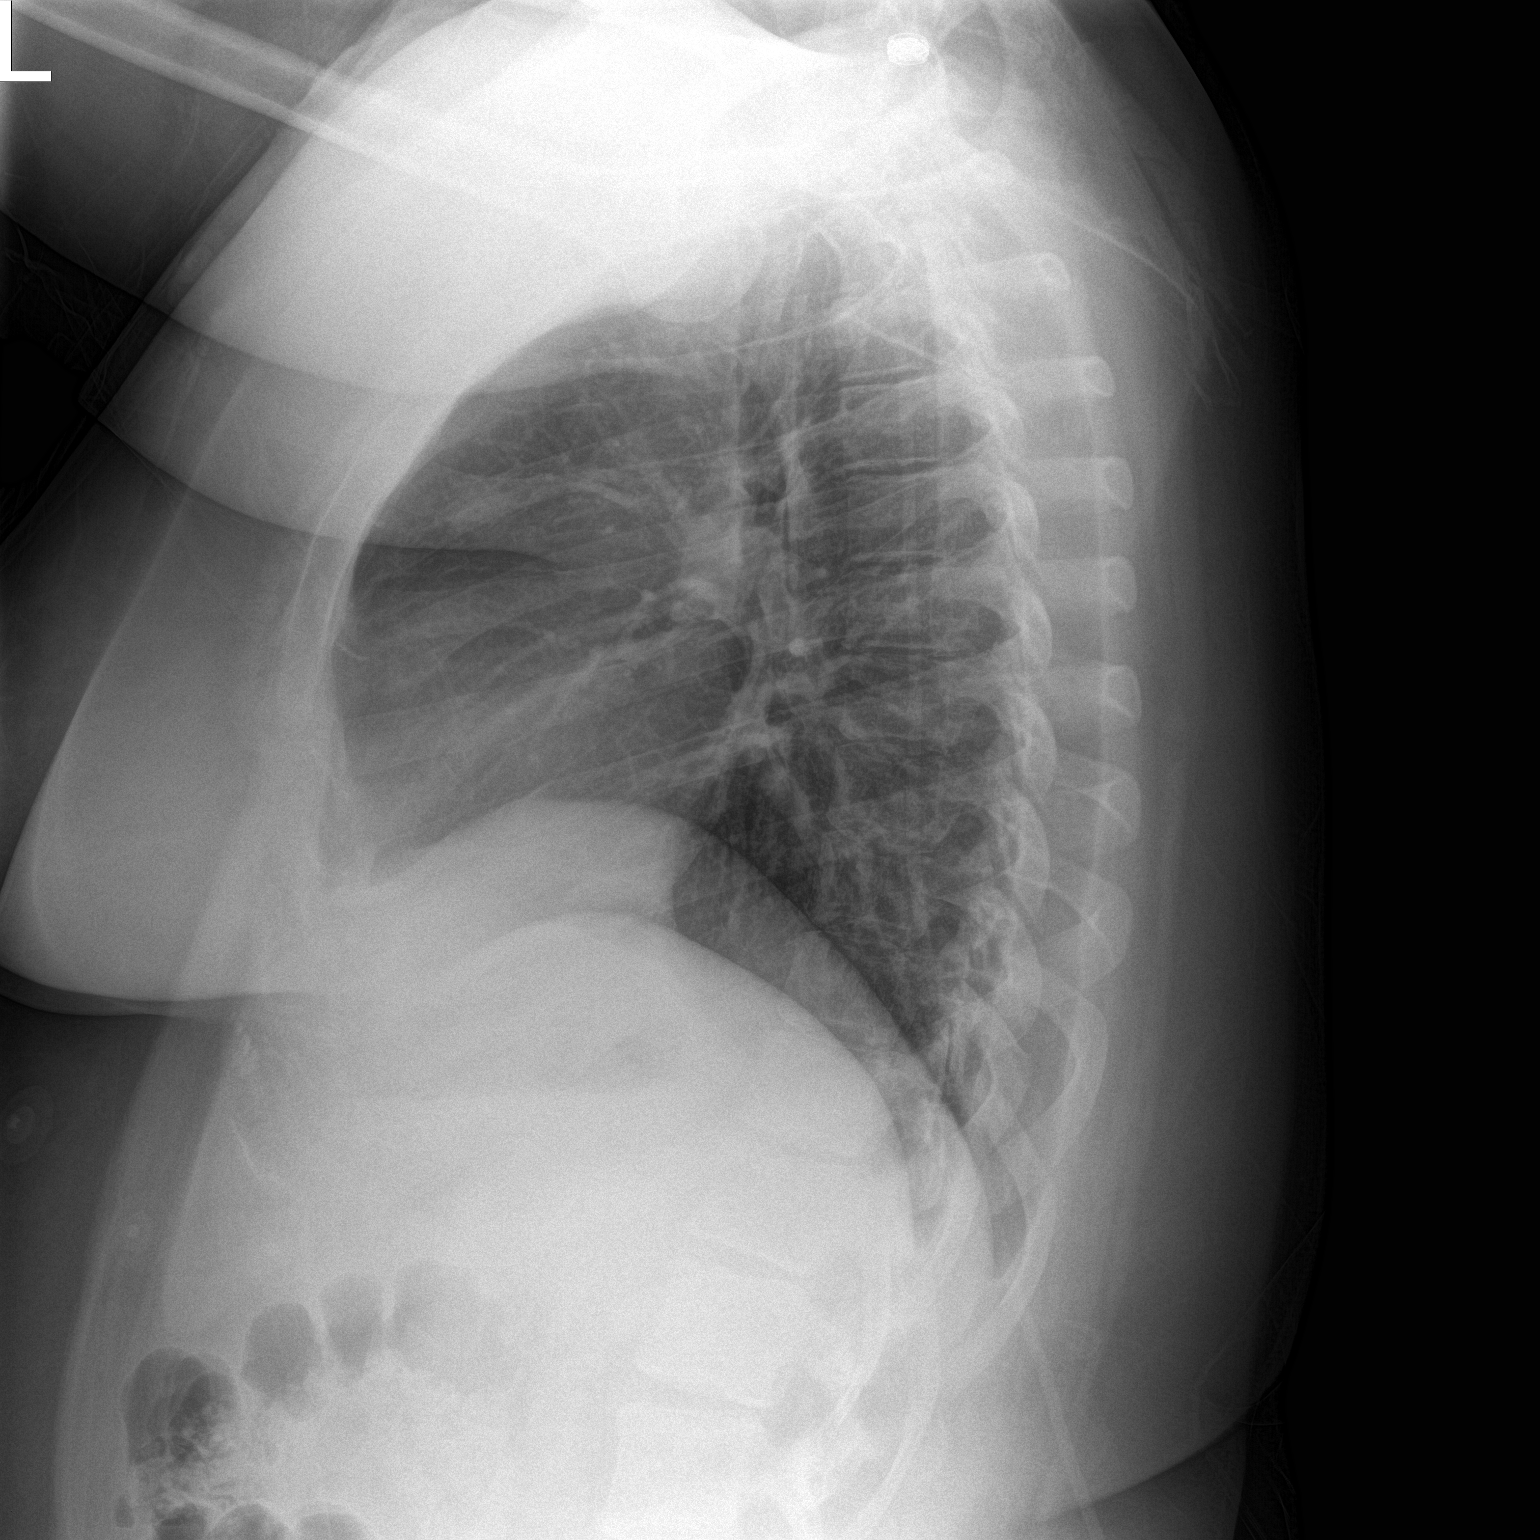

[2 of 2 positions shown; findings below may reference images not displayed]

FINDINGS: The heart size and mediastinal contours are within normal limits.
Both lungs are clear. The visualized skeletal structures are
unremarkable.
IMPRESSION: No active cardiopulmonary disease.

## 2016-12-18 ENCOUNTER — Other Ambulatory Visit: Payer: Self-pay

## 2016-12-18 ENCOUNTER — Encounter (HOSPITAL_COMMUNITY): Payer: Self-pay | Admitting: Emergency Medicine

## 2016-12-18 ENCOUNTER — Emergency Department (HOSPITAL_COMMUNITY)
Admission: EM | Admit: 2016-12-18 | Discharge: 2016-12-18 | Disposition: A | Discharge status: Home / Self Care | Payer: Self-pay | Attending: Emergency Medicine | Admitting: Emergency Medicine

## 2016-12-18 DIAGNOSIS — Z91040 Latex allergy status: Secondary | ICD-10-CM | POA: Insufficient documentation

## 2016-12-18 DIAGNOSIS — Z79899 Other long term (current) drug therapy: Secondary | ICD-10-CM | POA: Insufficient documentation

## 2016-12-18 DIAGNOSIS — Y939 Activity, unspecified: Secondary | ICD-10-CM | POA: Insufficient documentation

## 2016-12-18 DIAGNOSIS — Y998 Other external cause status: Secondary | ICD-10-CM | POA: Insufficient documentation

## 2016-12-18 DIAGNOSIS — S0501XA Injury of conjunctiva and corneal abrasion without foreign body, right eye, initial encounter: Secondary | ICD-10-CM | POA: Insufficient documentation

## 2016-12-18 DIAGNOSIS — Y33XXXA Other specified events, undetermined intent, initial encounter: Secondary | ICD-10-CM | POA: Insufficient documentation

## 2016-12-18 DIAGNOSIS — E039 Hypothyroidism, unspecified: Secondary | ICD-10-CM | POA: Insufficient documentation

## 2016-12-18 DIAGNOSIS — Y929 Unspecified place or not applicable: Secondary | ICD-10-CM | POA: Insufficient documentation

## 2016-12-18 MED ORDER — FLUORESCEIN SODIUM 1 MG OP STRP
1.0000 | ORAL_STRIP | Freq: Once | OPHTHALMIC | Status: AC
  Administered 2016-12-18: 1 via OPHTHALMIC
  Filled 2016-12-18: qty 1

## 2016-12-18 MED ORDER — TETRACAINE HCL 0.5 % OP SOLN
2.0000 [drp] | Freq: Once | OPHTHALMIC | Status: AC
  Administered 2016-12-18: 2 [drp] via OPHTHALMIC
  Filled 2016-12-18: qty 4

## 2016-12-18 MED ORDER — POLYMYXIN B-TRIMETHOPRIM 10000-0.1 UNIT/ML-% OP SOLN: 1.0000 [drp] | Freq: Four times a day (QID) | OPHTHALMIC | 0 refills | Status: AC

## 2016-12-18 NOTE — ED Triage Notes (Signed)
Patient woke up this evening with right eye pain /redness/teary , her son accidentally scratch her right eye 2 weeks ago , mild blurred vision .

## 2016-12-18 NOTE — ED Provider Notes (Signed)
[name] Provider Note   CSN: [id number] Arrival date & time: 12/18/16  0151     History   Chief Complaint Chief Complaint  Patient presents with  . Conjunctivitis    HPI Kimberly [name] is a 26 y.o. female.  Patient awoke with difficulty opening right eye and sensation of "something clawing  At it".  It felt fine when she went to bed.  She denies any discharge or drainage.  She denies any trauma.  She believes her son may have scratched her right eye several weeks ago but has not had any problems since.  She has mildly blurred vision now.  She took some tobramycin drops that a family member had with some relief.  She does not wear glasses or contacts.  No pain with eye movement.  No runny nose, sore throat, fever or chills.   The history is provided by the patient.  Conjunctivitis  Pertinent negatives include no chest pain, no abdominal pain, no headaches and no shortness of breath.    Past Medical History:  Diagnosis Date  . Anxiety   . Asthma    "grew out of it" (11/16/2012)  . Chronic back pain   . Complication of anesthesia    "takes a lot to put me to sleep; woke up completely mid endoscopy" (11/16/2012)  . Depression   . Dysrhythmia    ST (11/16/2012)  . Gastric reflux   . 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 Iron deficiency anemia   . Migraines    "weekly" (11/16/2012)  . Ovarian cyst   . Pregnancy induced hypertension   . Schizophrenia ([place])    "moderate" (11/16/2012)  . Scoliosis     Patient Active Problem List   Diagnosis Date Noted  . Degenerative disc disease at L5-S1 level 06/23/2016  . Chronic back pain 05/20/2016  . Anemia 01/16/2016  . Ovarian cyst 04/25/2015  . Rubella non-immune status, antepartum 03/11/2015  . 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 Goiter 03/01/2011  . Acanthosis nigricans, acquired 03/01/2011  . Oligomenorrhea 03/01/2011  . Hirsutism  03/01/2011  . PCOS (polycystic ovarian syndrome) 03/01/2011  . Hypertension 03/01/2011  . GERD (gastroesophageal reflux disease) 03/01/2011  . Anxiety and depression 03/01/2011  . Obesity 07/03/2010    Past Surgical History:  Procedure Laterality Date  . LAPAROSCOPIC OVARIAN CYSTECTOMY Left 02/11/2016   Procedure: LAPAROSCOPIC OVARIAN CYSTECTOMY;  Surgeon: Emily Filbert, MD;  Location: [facility];  Service: Gynecology;  Laterality: Left;  . UPPER GI ENDOSCOPY  ~ 2006; ~2008    OB History    Gravida Para Term Preterm AB Living   2 1 1   1 1    SAB TAB Ectopic Multiple Live Births   1     0 1       Home Medications    Prior to Admission medications   Medication Sig Start Date End Date Taking? Authorizing Provider  diphenoxylate-atropine (LOMOTIL) 2.5-0.025 MG tablet Take 1 tablet by mouth 3 (three) times daily as needed for diarrhea or loose stools. 12/15/16   Arnoldo Morale, MD  FLUoxetine (PROZAC) 20 MG tablet Take 1 tablet (20 mg total) by mouth daily. 12/15/16   Arnoldo Morale, MD  levothyroxine (SYNTHROID, LEVOTHROID) 200 MCG tablet Take 1 tablet (200 mcg total) by mouth daily before breakfast. 12/16/16   Arnoldo Morale, MD  neomycin-polymyxin-hydrocortisone (CORTISPORIN) 3.5-10000-1 OTIC suspension Place 4 drops into the right ear 3 (three) times daily. Patient not  taking: Reported on 12/15/2016 09/22/16   Larene Pickett, PA-C  omeprazole (PRILOSEC) 20 MG capsule Take 1 capsule (20 mg total) by mouth daily. 12/15/16   Arnoldo Morale, MD  traMADol (ULTRAM) 50 MG tablet Take 1 tablet (50 mg total) by mouth every 12 (twelve) hours as needed. 12/15/16   Arnoldo Morale, MD    Family History Family History  Problem Relation Age of [place]  . Diabetes Mother   . Hypertension Mother   . Vision loss Mother   . Hypertension Father   . Hyperlipidemia Father   . Thyroid disease Maternal Grandmother     Social History Social History   Tobacco Use  . Smoking status: Never Smoker  .  Smokeless tobacco: Never Used  Substance Use Topics  . Alcohol use: Yes    Comment: very rare  . Drug use: No     Allergies   Sulfa antibiotics; Abilify [aripiprazole]; Amoxicillin-pot clavulanate; Cephalexin; Ferrous sulfate; Latex; Tape; Zicam cold remedy [erysidoron #1]; Keflex [cephalexin]; Percocet [oxycodone-acetaminophen]; and Vicodin [hydrocodone-acetaminophen]   Review of Systems Review of Systems  Constitutional: Negative for activity change, chills and fever.  HENT: Negative for congestion and sinus pain.   Eyes: Positive for pain, redness and visual disturbance. Negative for photophobia and discharge.  Respiratory: Negative for cough, chest tightness and shortness of breath.   Cardiovascular: Negative for chest pain.  Gastrointestinal: Negative for abdominal pain, nausea and vomiting.  Genitourinary: Negative for dysuria, hematuria and vaginal discharge.  Musculoskeletal: Negative for arthralgias and myalgias.  Skin: Negative for rash.  Neurological: Negative for dizziness, weakness and headaches.   all other systems are negative except as noted in the HPI and PMH.     Physical Exam Updated Vital Signs BP (!) 138/92 (BP Location: Right Arm)   Pulse 81   Temp 98.6 F (37 C) (Oral)   Resp 18   LMP 11/27/2016   SpO2 98%   Physical Exam  Constitutional: She is oriented to person, place, and time. She appears well-developed and well-nourished. No distress.  HENT:  Head: Normocephalic and atraumatic.  Mouth/Throat: Oropharynx is clear and moist. No oropharyngeal exudate.  Eyes: Conjunctivae and EOM are normal. Pupils are equal, round, and reactive to light. Right eye exhibits no discharge and no exudate. No foreign body present in the right eye.  Slit lamp exam:      The right eye shows corneal abrasion and fluorescein uptake. The right eye shows no corneal flare, no corneal ulcer, no hyphema and no hypopyon.    No pain with EOM No hymphema  Neck: Normal range  of motion. Neck supple.  No meningismus.  Cardiovascular: Normal rate, regular rhythm, normal heart sounds and intact distal pulses.  No murmur heard. Pulmonary/Chest: Effort normal and breath sounds normal. No respiratory distress.  Abdominal: Soft. There is no tenderness. There is no rebound and no guarding.  Musculoskeletal: Normal range of motion. She exhibits no edema or tenderness.  Neurological: She is alert and oriented to person, place, and time. No cranial nerve deficit. She exhibits normal muscle tone. Coordination normal.  No ataxia on finger to nose bilaterally. No pronator drift. 5/5 strength throughout. CN 2-12 intact.Equal grip strength. Sensation intact.   Skin: Skin is warm.  Psychiatric: She has a normal mood and affect. Her behavior is normal.  Nursing note and vitals reviewed.    ED Treatments / Results  Labs (all labs ordered are listed, but only abnormal results are displayed) Labs Reviewed - No data to  display  EKG  EKG Interpretation None       Radiology No results found.  Procedures Procedures (including critical care time)  Medications Ordered in ED Medications  tetracaine (PONTOCAINE) 0.5 % ophthalmic solution 2 drop (not administered)  fluorescein ophthalmic strip 1 strip (not administered)     Initial Impression / Assessment and Plan / ED Course  I have reviewed the triage vital signs and the nursing notes.  Pertinent labs & imaging results that were available during my care of the patient were reviewed by me and considered in my medical decision making (see chart for details).    Patient with right eye pain and foreign body sensation since this morning.  Mild blurry vision.  Patient in no distress.  No pain with extraocular movements.  No evidence of conjunctivitis or cellulitis.  Visual Acuity  R Near: 20/25     Patient will be treated for corneal abrasion.  Patient given antibiotics.  Tetanus is up-to-date.  Ophthalmology  follow-up.  Return precautions discussed. Final Clinical Impressions(s) / ED Diagnoses   Final diagnoses:  Abrasion of right cornea, initial encounter    ED Discharge Orders    None       Deshanti Adcox, Annie Main, MD 12/18/16 [phone]

## 2017-02-03 ENCOUNTER — Emergency Department (HOSPITAL_COMMUNITY)
Admission: EM | Admit: 2017-02-03 | Discharge: 2017-02-03 | Disposition: A | Discharge status: Home / Self Care | Payer: Self-pay | Attending: Emergency Medicine | Admitting: Emergency Medicine

## 2017-02-03 ENCOUNTER — Encounter (HOSPITAL_COMMUNITY): Payer: Self-pay | Admitting: *Deleted

## 2017-02-03 ENCOUNTER — Other Ambulatory Visit: Payer: Self-pay

## 2017-02-03 DIAGNOSIS — K529 Noninfective gastroenteritis and colitis, unspecified: Secondary | ICD-10-CM | POA: Insufficient documentation

## 2017-02-03 DIAGNOSIS — I10 Essential (primary) hypertension: Secondary | ICD-10-CM | POA: Insufficient documentation

## 2017-02-03 DIAGNOSIS — Z91040 Latex allergy status: Secondary | ICD-10-CM | POA: Insufficient documentation

## 2017-02-03 DIAGNOSIS — R112 Nausea with vomiting, unspecified: Secondary | ICD-10-CM

## 2017-02-03 DIAGNOSIS — Z79899 Other long term (current) drug therapy: Secondary | ICD-10-CM | POA: Insufficient documentation

## 2017-02-03 DIAGNOSIS — R197 Diarrhea, unspecified: Secondary | ICD-10-CM

## 2017-02-03 DIAGNOSIS — E032 Hypothyroidism due to medicaments and other exogenous substances: Secondary | ICD-10-CM | POA: Insufficient documentation

## 2017-02-03 LAB — CBC
HEMATOCRIT: 37.5 % (ref 36.0–46.0)
HEMOGLOBIN: 11.6 g/dL — AB (ref 12.0–15.0)
MCH: 25.5 pg — ABNORMAL LOW (ref 26.0–34.0)
MCHC: 30.9 g/dL (ref 30.0–36.0)
MCV: 82.4 fL (ref 78.0–100.0)
Platelets: 326 10*3/uL (ref 150–400)
RBC: 4.55 MIL/uL (ref 3.87–5.11)
RDW: 14.9 % (ref 11.5–15.5)
WBC: 7.8 10*3/uL (ref 4.0–10.5)

## 2017-02-03 LAB — COMPREHENSIVE METABOLIC PANEL
ALBUMIN: 3.2 g/dL — AB (ref 3.5–5.0)
ALT: 14 U/L (ref 14–54)
ANION GAP: 10 (ref 5–15)
AST: 17 U/L (ref 15–41)
Alkaline Phosphatase: 77 U/L (ref 38–126)
BUN: 6 mg/dL (ref 6–20)
CHLORIDE: 107 mmol/L (ref 101–111)
CO2: 22 mmol/L (ref 22–32)
Calcium: 8.6 mg/dL — ABNORMAL LOW (ref 8.9–10.3)
Creatinine, Ser: 0.57 mg/dL (ref 0.44–1.00)
GFR calc Af Amer: >60 mL/min (ref >60)
GFR calc non Af Amer: >60 mL/min (ref >60)
GLUCOSE: 101 mg/dL — AB (ref 65–99)
POTASSIUM: 3.9 mmol/L (ref 3.5–5.1)
Sodium: 139 mmol/L (ref 135–145)
Total Bilirubin: 1 mg/dL (ref 0.3–1.2)
Total Protein: 6.8 g/dL (ref 6.5–8.1)

## 2017-02-03 LAB — URINALYSIS, ROUTINE W REFLEX MICROSCOPIC
BILIRUBIN URINE: NEGATIVE
Glucose, UA: NEGATIVE mg/dL
KETONES UR: NEGATIVE mg/dL
Nitrite: NEGATIVE
PROTEIN: 30 mg/dL — AB
Specific Gravity, Urine: 1.03 (ref 1.005–1.030)
pH: 5 (ref 5.0–8.0)

## 2017-02-03 LAB — LIPASE, BLOOD: LIPASE: 27 U/L (ref 11–51)

## 2017-02-03 LAB — I-STAT BETA HCG BLOOD, ED (MC, WL, AP ONLY): HCG, QUANTITATIVE: 9.3 m[IU]/mL — AB (ref <5)

## 2017-02-03 LAB — HCG, QUANTITATIVE, PREGNANCY: hCG, Beta Chain, Quant, S: <1 m[IU]/mL (ref <5)

## 2017-02-03 MED ORDER — LOPERAMIDE HCL 2 MG PO TABS: 2.0000 mg | ORAL_TABLET | Freq: Four times a day (QID) | ORAL | 0 refills | Status: DC | PRN

## 2017-02-03 MED ORDER — SODIUM CHLORIDE 0.9 % IV SOLN
INTRAVENOUS | Status: DC
  Administered 2017-02-03: 13:00:00 via INTRAVENOUS

## 2017-02-03 MED ORDER — ONDANSETRON 4 MG PO TBDP: 4.0000 mg | ORAL_TABLET | Freq: Three times a day (TID) | ORAL | 1 refills | Status: DC | PRN

## 2017-02-03 MED ORDER — ONDANSETRON HCL 4 MG/2ML IJ SOLN
4.0000 mg | Freq: Once | INTRAMUSCULAR | Status: AC
  Administered 2017-02-03: 4 mg via INTRAVENOUS
  Filled 2017-02-03: qty 2

## 2017-02-03 MED ORDER — SODIUM CHLORIDE 0.9 % IV BOLUS (SEPSIS)
2000.0000 mL | Freq: Once | INTRAVENOUS | Status: AC
  Administered 2017-02-03: 2000 mL via INTRAVENOUS

## 2017-02-03 MED ORDER — IOPAMIDOL (ISOVUE-370) INJECTION 76%
INTRAVENOUS | Status: DC
Start: 2017-02-03 — End: 2017-02-03
  Filled 2017-02-03: qty 100

## 2017-02-03 MED ORDER — ONDANSETRON HCL 4 MG/2ML IJ SOLN
4.0000 mg | Freq: Once | INTRAMUSCULAR | Status: AC
Start: 2017-02-03 — End: 2017-02-03
  Administered 2017-02-03: 4 mg via INTRAVENOUS
  Filled 2017-02-03: qty 2

## 2017-02-03 MED ORDER — PROMETHAZINE HCL 25 MG PO TABS: 25.0000 mg | ORAL_TABLET | Freq: Four times a day (QID) | ORAL | 1 refills | Status: DC | PRN

## 2017-02-03 MED FILL — PROMETHAZINE 25 MG TABLET: 25 | 3 days supply | Qty: 12 | Fill #0

## 2017-02-03 MED FILL — ONDANSETRON ODT 4 MG TABLET: 4 | 3 days supply | Qty: 10 | Fill #0

## 2017-02-03 NOTE — ED Notes (Signed)
ED Provider at bedside. 

## 2017-02-03 NOTE — ED Provider Notes (Signed)
[place] EMERGENCY DEPARTMENT Provider Note   CSN: [id number] Arrival date & time: 02/03/17  0710     History   Chief Complaint Chief Complaint  Patient presents with  . Emesis  . Diarrhea    HPI Kimberly [name] is a 27 y.o. female.  Patient with 3-day history of nausea vomiting the first day it was mostly diarrhea.  Vomiting started yesterday.  Then she vomited a lot overnight and had persistent diarrhea.  Some streaks of blood in her vomit no blood in the diarrhea associated with some mild abdominal cramping.  But no significant abdominal pain.  No known sick exposures.      Past Medical History:  Diagnosis Date  . Anxiety   . Asthma    "grew out of it" (11/16/2012)  . Chronic back pain   . Complication of anesthesia    "takes a lot to put me to sleep; woke up completely mid endoscopy" (11/16/2012)  . Depression   . Dysrhythmia    ST (11/16/2012)  . Gastric reflux   . 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 Iron deficiency anemia   . Migraines    "weekly" (11/16/2012)  . Ovarian cyst   . Pregnancy induced hypertension   . Schizophrenia ([place])    "moderate" (11/16/2012)  . Scoliosis     Patient Active Problem List   Diagnosis Date Noted  . Degenerative disc disease at L5-S1 level 06/23/2016  . Chronic back pain 05/20/2016  . Anemia 01/16/2016  . Ovarian cyst 04/25/2015  . Rubella non-immune status, antepartum 03/11/2015  . 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 Goiter 03/01/2011  . Acanthosis nigricans, acquired 03/01/2011  . Oligomenorrhea 03/01/2011  . Hirsutism 03/01/2011  . PCOS (polycystic ovarian syndrome) 03/01/2011  . Hypertension 03/01/2011  . GERD (gastroesophageal reflux disease) 03/01/2011  . Anxiety and depression 03/01/2011  . Obesity 07/03/2010    Past Surgical History:  Procedure Laterality Date  . LAPAROSCOPIC OVARIAN CYSTECTOMY Left 02/11/2016   Procedure: LAPAROSCOPIC  OVARIAN CYSTECTOMY;  Surgeon: Emily Filbert, MD;  Location: [facility];  Service: Gynecology;  Laterality: Left;  . UPPER GI ENDOSCOPY  ~ 2006; ~2008    OB History    Gravida Para Term Preterm AB Living   2 1 1   1 1    SAB TAB Ectopic Multiple Live Births   1     0 1       Home Medications    Prior to Admission medications   Medication Sig Start Date End Date Taking? Authorizing Provider  diphenoxylate-atropine (LOMOTIL) 2.5-0.025 MG tablet Take 1 tablet by mouth 3 (three) times daily as needed for diarrhea or loose stools. 12/15/16   Arnoldo Morale, MD  FLUoxetine (PROZAC) 20 MG tablet Take 1 tablet (20 mg total) by mouth daily. 12/15/16   Arnoldo Morale, MD  levothyroxine (SYNTHROID, LEVOTHROID) 200 MCG tablet Take 1 tablet (200 mcg total) by mouth daily before breakfast. 12/16/16   Arnoldo Morale, MD  loperamide (IMODIUM A-D) 2 MG tablet Take 1 tablet (2 mg total) by mouth 4 (four) times daily as needed for diarrhea or loose stools. 02/03/17   Fredia Sorrow, MD  neomycin-polymyxin-hydrocortisone (CORTISPORIN) 3.5-10000-1 OTIC suspension Place 4 drops into the right ear 3 (three) times daily. Patient not taking: Reported on 12/15/2016 09/22/16   Larene Pickett, PA-C  omeprazole (PRILOSEC) 20 MG capsule Take 1 capsule (20 mg total) by mouth daily. 12/15/16   Arnoldo Morale, MD  ondansetron (ZOFRAN ODT) 4 MG disintegrating tablet Take 1 tablet (4 mg total) by mouth every 8 (eight) hours as needed for nausea or vomiting. 02/03/17   Fredia Sorrow, MD  promethazine (PHENERGAN) 25 MG tablet Take 1 tablet (25 mg total) by mouth every 6 (six) hours as needed for nausea or vomiting. 02/03/17   Fredia Sorrow, MD  traMADol (ULTRAM) 50 MG tablet Take 1 tablet (50 mg total) by mouth every 12 (twelve) hours as needed. 12/15/16   Arnoldo Morale, MD    Family History Family History  Problem Relation Age of Onset  . Diabetes Mother   . Hypertension Mother   . Vision loss Mother   . Hypertension Father    . Hyperlipidemia Father   . Thyroid disease Maternal Grandmother     Social History Social History   Tobacco Use  . Smoking status: Never Smoker  . Smokeless tobacco: Never Used  Substance Use Topics  . Alcohol use: Yes    Comment: very rare  . Drug use: No     Allergies   Sulfa antibiotics; Abilify [aripiprazole]; Amoxicillin-pot clavulanate; Cephalexin; Ferrous sulfate; Latex; Tape; Zicam cold remedy [erysidoron #1]; Keflex [cephalexin]; Percocet [oxycodone-acetaminophen]; and Vicodin [hydrocodone-acetaminophen]   Review of Systems Review of Systems  Constitutional: Positive for chills and fever.  HENT: Negative for congestion.   Eyes: Negative for redness.  Respiratory: Negative for shortness of breath.   Cardiovascular: Negative for chest pain.  Gastrointestinal: Positive for abdominal pain, diarrhea, nausea and vomiting.  Genitourinary: Negative for dysuria.  Musculoskeletal: Negative for myalgias.  Skin: Negative for rash.  Neurological: Negative for headaches.  Hematological: Does not bruise/bleed easily.  Psychiatric/Behavioral: Negative for confusion.     Physical Exam Updated Vital Signs BP 111/79   Pulse (!) 104   Temp 98.5 F (36.9 C) (Oral)   Resp 18   Ht 1.626 m (5\' 4" )   Wt 117.9 kg (260 lb)   LMP 12/30/2016   SpO2 100%   BMI 44.63 kg/m   Physical Exam  Constitutional: She is oriented to person, place, and time. She appears well-developed and well-nourished. No distress.  HENT:  Head: Normocephalic and atraumatic.  Mucous membranes dry.  Eyes: Conjunctivae and EOM are normal. Pupils are equal, round, and reactive to light.  Neck: Normal range of motion. Neck supple.  Cardiovascular: Normal rate, regular rhythm and normal heart sounds.  Pulmonary/Chest: Effort normal and breath sounds normal.  Abdominal: Soft. Bowel sounds are normal. There is no tenderness.  Musculoskeletal: Normal range of motion.  Neurological: She is alert and  oriented to person, place, and time. No cranial nerve deficit or sensory deficit. She exhibits normal muscle tone. Coordination normal.  Skin: Skin is warm.  Nursing note and vitals reviewed.    ED Treatments / Results  Labs (all labs ordered are listed, but only abnormal results are displayed) Labs Reviewed  COMPREHENSIVE METABOLIC PANEL - Abnormal; Notable for the following components:      Result Value   Glucose, Bld 101 (*)    Calcium 8.6 (*)    Albumin 3.2 (*)    All other components within normal limits  CBC - Abnormal; Notable for the following components:   Hemoglobin 11.6 (*)    MCH 25.5 (*)    All other components within normal limits  URINALYSIS, ROUTINE W REFLEX MICROSCOPIC - Abnormal; Notable for the following components:   Color, Urine AMBER (*)    APPearance CLOUDY (*)    Hgb urine dipstick SMALL (*)  Protein, ur 30 (*)    Leukocytes, UA TRACE (*)    Bacteria, UA FEW (*)    Squamous Epithelial / LPF 6-30 (*)    All other components within normal limits  I-STAT BETA HCG BLOOD, ED ([facility], [facility], [facility] ONLY) - Abnormal; Notable for the following components:   I-stat hCG, quantitative 9.3 (*)    All other components within normal limits  LIPASE, BLOOD  HCG, QUANTITATIVE, PREGNANCY    EKG  EKG Interpretation None       Radiology No results found.  Procedures Procedures (including critical care time)  Medications Ordered in ED Medications  iopamidol (ISOVUE-370) 76 % injection (not administered)  0.9 %  sodium chloride infusion ( Intravenous New Bag/Given 02/03/17 1243)  sodium chloride 0.9 % bolus 2,000 mL (2,000 mLs Intravenous New Bag/Given 02/03/17 1242)  ondansetron (ZOFRAN) injection 4 mg (4 mg Intravenous Given 02/03/17 1243)     Initial Impression / Assessment and Plan / ED Course  I have reviewed the triage vital signs and the nursing notes.  Pertinent labs & imaging results that were available during my care of the patient were reviewed by me and  considered in my medical decision making (see chart for details).     Patient symptoms suggestive of a viral gastroenteritis.  Patient nontoxic no acute distress.  Patient given 2 L of normal saline.  Patient also treated with Zofran.  Overall feeling better.  Labs without any significant abnormality.  Patient's point-of-care pregnancy test was elevated but then quantitative hCG showed there is no concerns for pregnancy.  Patient will be treated at home with Zofran and if too expensive Phenergan and Imodium right ear.  Work note provided.  Abdomen was soft no acute abdominal process.  Final Clinical Impressions(s) / ED Diagnoses   Final diagnoses:  Gastroenteritis  Nausea vomiting and diarrhea    ED Discharge Orders        Ordered    ondansetron (ZOFRAN ODT) 4 MG disintegrating tablet  Every 8 hours PRN     02/03/17 1457    promethazine (PHENERGAN) 25 MG tablet  Every 6 hours PRN     02/03/17 1457    loperamide (IMODIUM A-D) 2 MG tablet  4 times daily PRN     02/03/17 1457       Fredia Sorrow, MD 02/03/17 1704

## 2017-02-03 NOTE — ED Triage Notes (Signed)
Pt having n/v/d x 3 days with bodyaches and fever.

## 2017-02-03 NOTE — Discharge Instructions (Signed)
Take the Zofran on a regular basis to control the nausea and vomiting take Imodium right ear as needed for the diarrhea.  This is also available over-the-counter.  In addition of the Zofran is too expensive Phenergan has been prescribed to help control the nausea and vomiting.  Note provided for over the next 2 days.  Return for worse fevers any blood in the bowel movements or vomit or any worse abdominal pain.

## 2017-02-03 NOTE — ED Notes (Signed)
Pt removed BP cuff, c/o nausea. EDP made aware.

## 2017-04-16 ENCOUNTER — Emergency Department (HOSPITAL_COMMUNITY)
Admission: EM | Admit: 2017-04-16 | Discharge: 2017-04-16 | Disposition: A | Discharge status: Home / Self Care | Payer: Self-pay | Attending: Physician Assistant | Admitting: Physician Assistant

## 2017-04-16 ENCOUNTER — Other Ambulatory Visit: Payer: Self-pay

## 2017-04-16 ENCOUNTER — Encounter (HOSPITAL_COMMUNITY): Payer: Self-pay | Admitting: *Deleted

## 2017-04-16 DIAGNOSIS — Z91040 Latex allergy status: Secondary | ICD-10-CM | POA: Insufficient documentation

## 2017-04-16 DIAGNOSIS — I10 Essential (primary) hypertension: Secondary | ICD-10-CM | POA: Insufficient documentation

## 2017-04-16 DIAGNOSIS — A084 Viral intestinal infection, unspecified: Secondary | ICD-10-CM | POA: Insufficient documentation

## 2017-04-16 DIAGNOSIS — E039 Hypothyroidism, unspecified: Secondary | ICD-10-CM | POA: Insufficient documentation

## 2017-04-16 DIAGNOSIS — Z79899 Other long term (current) drug therapy: Secondary | ICD-10-CM | POA: Insufficient documentation

## 2017-04-16 DIAGNOSIS — J45909 Unspecified asthma, uncomplicated: Secondary | ICD-10-CM | POA: Insufficient documentation

## 2017-04-16 LAB — URINALYSIS, ROUTINE W REFLEX MICROSCOPIC
Bilirubin Urine: NEGATIVE
GLUCOSE, UA: NEGATIVE mg/dL
Ketones, ur: NEGATIVE mg/dL
Nitrite: NEGATIVE
PH: 5 (ref 5.0–8.0)
PROTEIN: NEGATIVE mg/dL
Specific Gravity, Urine: 1.027 (ref 1.005–1.030)

## 2017-04-16 LAB — I-STAT BETA HCG BLOOD, ED (MC, WL, AP ONLY): I-stat hCG, quantitative: <5 m[IU]/mL (ref <5)

## 2017-04-16 LAB — COMPREHENSIVE METABOLIC PANEL
ALK PHOS: 91 U/L (ref 38–126)
ALT: 19 U/L (ref 14–54)
ANION GAP: 11 (ref 5–15)
AST: 25 U/L (ref 15–41)
Albumin: 3.8 g/dL (ref 3.5–5.0)
BUN: 6 mg/dL (ref 6–20)
CALCIUM: 9 mg/dL (ref 8.9–10.3)
CO2: 24 mmol/L (ref 22–32)
Chloride: 105 mmol/L (ref 101–111)
Creatinine, Ser: 0.56 mg/dL (ref 0.44–1.00)
Glucose, Bld: 96 mg/dL (ref 65–99)
Potassium: 4.1 mmol/L (ref 3.5–5.1)
SODIUM: 140 mmol/L (ref 135–145)
TOTAL PROTEIN: 7.1 g/dL (ref 6.5–8.1)
Total Bilirubin: 1.2 mg/dL (ref 0.3–1.2)

## 2017-04-16 LAB — CBC
HCT: 38.9 % (ref 36.0–46.0)
HEMOGLOBIN: 11.7 g/dL — AB (ref 12.0–15.0)
MCH: 25.1 pg — ABNORMAL LOW (ref 26.0–34.0)
MCHC: 30.1 g/dL (ref 30.0–36.0)
MCV: 83.3 fL (ref 78.0–100.0)
Platelets: 407 10*3/uL — ABNORMAL HIGH (ref 150–400)
RBC: 4.67 MIL/uL (ref 3.87–5.11)
RDW: 14.6 % (ref 11.5–15.5)
WBC: 9.3 10*3/uL (ref 4.0–10.5)

## 2017-04-16 LAB — RAPID STREP SCREEN (MED CTR MEBANE ONLY): Streptococcus, Group A Screen (Direct): NEGATIVE

## 2017-04-16 LAB — LIPASE, BLOOD: Lipase: 27 U/L (ref 11–51)

## 2017-04-16 MED ORDER — ONDANSETRON HCL 4 MG PO TABS
4.0000 mg | ORAL_TABLET | Freq: Once | ORAL | Status: AC
  Administered 2017-04-16: 4 mg via ORAL
  Filled 2017-04-16: qty 1

## 2017-04-16 MED ORDER — ONDANSETRON 4 MG PO TBDP: 4.0000 mg | ORAL_TABLET | Freq: Three times a day (TID) | ORAL | 0 refills | Status: DC | PRN

## 2017-04-16 NOTE — Discharge Instructions (Signed)
We think you likely have viral gastroenteritis.  Please use Zofran as needed.  Please be sure to drink plenty of fluids, rest, and follow-up with your primary care physician.

## 2017-04-16 NOTE — ED Notes (Signed)
C/o nausea vomiting and diarrhea for several days , states she feels very tired. No one else is sick around her

## 2017-04-16 NOTE — ED Triage Notes (Signed)
Pt states past 3 days vomiting, diarrhea, sore throat and recently got back from the beach.  Pt states before she left there was a gas leak in her apartment.  Prior to leaving she was having bad headaches.  Concerned if this part of that or a virus.

## 2017-04-16 NOTE — ED Provider Notes (Signed)
[facility] EMERGENCY DEPARTMENT Provider Note   CSN: [id number] Arrival date & time: 04/16/17  7353     History   Chief Complaint No chief complaint on file.   HPI Kimberly [name] is a 27 y.o. female.  HPI  Patient is a 27 year old female with multiple complaints.  Patient reports that she was having a gas leak in her home and she had headaches at that time.  Then she went to the beach.  On the way back in the beach 3 days later she developed a mild headache.  And then some vomiting and diarrhea.  She thinks she was worried that it could be related.  Patient had some mild nausea and vomiting and diarrhea.  However she is been able to take p.o.  Past Medical History:  Diagnosis Date  . Anxiety   . Asthma    "grew out of it" (11/16/2012)  . Chronic back pain   . Complication of anesthesia    "takes a lot to put me to sleep; woke up completely mid endoscopy" (11/16/2012)  . Depression   . Dysrhythmia    ST (11/16/2012)  . Gastric reflux   . 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 Iron deficiency anemia   . Migraines    "weekly" (11/16/2012)  . Ovarian cyst   . Pregnancy induced hypertension   . Schizophrenia ([place])    "moderate" (11/16/2012)  . Scoliosis     Patient Active Problem List   Diagnosis Date Noted  . Degenerative disc disease at L5-S1 level 06/23/2016  . Chronic back pain 05/20/2016  . Anemia 01/16/2016  . Ovarian cyst 04/25/2015  . Rubella non-immune status, antepartum 03/11/2015  . 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 Goiter 03/01/2011  . Acanthosis nigricans, acquired 03/01/2011  . Oligomenorrhea 03/01/2011  . Hirsutism 03/01/2011  . PCOS (polycystic ovarian syndrome) 03/01/2011  . Hypertension 03/01/2011  . GERD (gastroesophageal reflux disease) 03/01/2011  . Anxiety and depression 03/01/2011  . Obesity 07/03/2010    Past Surgical History:  Procedure Laterality Date  . LAPAROSCOPIC  OVARIAN CYSTECTOMY Left 02/11/2016   Procedure: LAPAROSCOPIC OVARIAN CYSTECTOMY;  Surgeon: Emily Filbert, MD;  Location: [facility];  Service: Gynecology;  Laterality: Left;  . UPPER GI ENDOSCOPY  ~ 2006; ~2008     OB History    Gravida  2   Para  1   Term  1   Preterm      AB  1   Living  1     SAB  1   TAB      Ectopic      Multiple  0   Live Births  1            Home Medications    Prior to Admission medications   Medication Sig Start Date End Date Taking? Authorizing Provider  diphenoxylate-atropine (LOMOTIL) 2.5-0.025 MG tablet Take 1 tablet by mouth 3 (three) times daily as needed for diarrhea or loose stools. 12/15/16   Charlott Rakes, MD  FLUoxetine (PROZAC) 20 MG tablet Take 1 tablet (20 mg total) by mouth daily. 12/15/16   Charlott Rakes, MD  levothyroxine (SYNTHROID, LEVOTHROID) 200 MCG tablet Take 1 tablet (200 mcg total) by mouth daily before breakfast. 12/16/16   Charlott Rakes, MD  loperamide (IMODIUM A-D) 2 MG tablet Take 1 tablet (2 mg total) by mouth 4 (four) times daily as needed for diarrhea or loose stools. 02/03/17   Fredia Sorrow, MD  neomycin-polymyxin-hydrocortisone (CORTISPORIN) 3.5-10000-1 OTIC suspension Place 4 drops into the right ear 3 (three) times daily. Patient not taking: Reported on 12/15/2016 09/22/16   Larene Pickett, PA-C  omeprazole (PRILOSEC) 20 MG capsule Take 1 capsule (20 mg total) by mouth daily. 12/15/16   Charlott Rakes, MD  ondansetron (ZOFRAN ODT) 4 MG disintegrating tablet Take 1 tablet (4 mg total) by mouth every 8 (eight) hours as needed for nausea or vomiting. 02/03/17   Fredia Sorrow, MD  ondansetron (ZOFRAN ODT) 4 MG disintegrating tablet Take 1 tablet (4 mg total) by mouth every 8 (eight) hours as needed for nausea or vomiting. 04/16/17   Temara Lanum Lyn, MD  promethazine (PHENERGAN) 25 MG tablet Take 1 tablet (25 mg total) by mouth every 6 (six) hours as needed for nausea or vomiting. 02/03/17   Fredia Sorrow, MD  traMADol (ULTRAM) 50 MG tablet Take 1 tablet (50 mg total) by mouth every 12 (twelve) hours as needed. 12/15/16   Charlott Rakes, MD  bethanechol (URECHOLINE) 5 MG tablet Take 5 mg by mouth 3 (three) times daily.   02/05/12  [provider]  ferrous sulfate 325 (65 FE) MG tablet Take 1 tablet (325 mg total) by mouth daily. Patient not taking: Reported on 03/16/2014 01/15/14 08/07/14  Britt Bottom, NP    Family History Family History  Problem Relation Age of Onset  . Diabetes Mother   . Hypertension Mother   . Vision loss Mother   . Hypertension Father   . Hyperlipidemia Father   . Thyroid disease Maternal Grandmother     Social History Social History   Tobacco Use  . Smoking status: Never Smoker  . Smokeless tobacco: Never Used  Substance Use Topics  . Alcohol use: Yes    Comment: very rare  . Drug use: No     Allergies   Sulfa antibiotics; Abilify [aripiprazole]; Amoxicillin-pot clavulanate; Cephalexin; Ferrous sulfate; Latex; Tape; Zicam cold remedy [erysidoron #1]; Keflex [cephalexin]; Percocet [oxycodone-acetaminophen]; and Vicodin [hydrocodone-acetaminophen]   Review of Systems Review of Systems  Constitutional: Negative for activity change and fatigue.  HENT: Negative for congestion and drooling.   Eyes: Negative for discharge.  Respiratory: Negative for cough and chest tightness.   Cardiovascular: Negative for chest pain.  Gastrointestinal: Positive for diarrhea and vomiting. Negative for abdominal distention and abdominal pain.  Genitourinary: Negative for difficulty urinating and dysuria.  Musculoskeletal: Negative for joint swelling.  Skin: Negative for rash.  Allergic/Immunologic: Negative for immunocompromised state.  Neurological: Positive for headaches. Negative for seizures and speech difficulty.  Psychiatric/Behavioral: Negative for agitation and behavioral problems.  All other systems reviewed and are negative.    Physical  Exam Updated Vital Signs BP 114/72 (BP Location: Right Arm)   Pulse (!) 107   Temp 99 F (37.2 C) (Oral)   Resp 20   LMP 04/13/2017 (Approximate)   SpO2 97%   Physical Exam  Constitutional: She is oriented to person, place, and time. She appears well-developed and well-nourished.  Appears very well, sitting comfortably in the bed with well-hydrated membranes.  HENT:  Head: Normocephalic and atraumatic.  Eyes: Right eye exhibits no discharge. Left eye exhibits no discharge.  Cardiovascular: Normal rate, regular rhythm and normal heart sounds.  No murmur heard. Pulmonary/Chest: Effort normal and breath sounds normal. She has no wheezes. She has no rales.  Abdominal: Soft. She exhibits no distension. There is no tenderness.  Neurological: She is oriented to person, place, and time.  Skin: Skin is warm and dry.  She is not diaphoretic.  Psychiatric: She has a normal mood and affect.  Nursing note and vitals reviewed.    ED Treatments / Results  Labs (all labs ordered are listed, but only abnormal results are displayed) Labs Reviewed  CBC - Abnormal; Notable for the following components:      Result Value   Hemoglobin 11.7 (*)    MCH 25.1 (*)    Platelets 407 (*)    All other components within normal limits  URINALYSIS, ROUTINE W REFLEX MICROSCOPIC - Abnormal; Notable for the following components:   Color, Urine AMBER (*)    APPearance HAZY (*)    Hgb urine dipstick SMALL (*)    Leukocytes, UA SMALL (*)    Bacteria, UA MANY (*)    Squamous Epithelial / LPF 6-30 (*)    All other components within normal limits  RAPID STREP SCREEN (NOT AT [facility])  CULTURE, GROUP A STREP ([place])  LIPASE, BLOOD  COMPREHENSIVE METABOLIC PANEL  I-STAT BETA HCG BLOOD, ED ([facility], [facility], [facility] ONLY)    EKG None  Radiology No results found.  Procedures Procedures (including critical care time)  Medications Ordered in ED Medications  ondansetron (ZOFRAN) tablet 4 mg (has no administration in time  range)     Initial Impression / Assessment and Plan / ED Course  I have reviewed the triage vital signs and the nursing notes.  Pertinent labs & imaging results that were available during my care of the patient were reviewed by me and considered in my medical decision making (see chart for details).     Patient is a 27 year old female with multiple complaints.  Patient reports that she was having a gas leak in her home and she had headaches at that time.  Then she went to the beach.  On the way back in the beach 3 days later she developed a mild headache.  And then some vomiting and diarrhea.  She thinks she was worried that it could be related.  Patient had some mild nausea and vomiting and diarrhea.  However she is been able to take p.o.  2:18 PM Nausea vomiting diarrhea is been going on for several days.  She feels it is slightly improving.  She appears very well.  Labs are reassuring.  Mild tachycardia secondary to likely dehydration.  We will give her Zofran so she is able to stay hydrated at home.  Patient appears nontoxic.  Final Clinical Impressions(s) / ED Diagnoses   Final diagnoses:  Viral gastroenteritis    ED Discharge Orders        Ordered    ondansetron (ZOFRAN ODT) 4 MG disintegrating tablet  Every 8 hours PRN     04/16/17 1412       Gwenetta Devos Lyn, MD 04/16/17 1418

## 2017-04-18 LAB — CULTURE, GROUP A STREP (THRC)

## 2017-08-30 ENCOUNTER — Ambulatory Visit: Payer: Self-pay | Attending: Family Medicine | Admitting: Family Medicine

## 2017-08-30 ENCOUNTER — Encounter: Payer: Self-pay | Admitting: Family Medicine

## 2017-08-30 VITALS — BP 131/88 | HR 108 | Temp 98.0°F | Ht 64.0 in | Wt 269.2 lb

## 2017-08-30 DIAGNOSIS — R609 Edema, unspecified: Secondary | ICD-10-CM | POA: Insufficient documentation

## 2017-08-30 DIAGNOSIS — Z882 Allergy status to sulfonamides status: Secondary | ICD-10-CM | POA: Insufficient documentation

## 2017-08-30 DIAGNOSIS — Z880 Allergy status to penicillin: Secondary | ICD-10-CM | POA: Insufficient documentation

## 2017-08-30 DIAGNOSIS — F329 Major depressive disorder, single episode, unspecified: Secondary | ICD-10-CM | POA: Insufficient documentation

## 2017-08-30 DIAGNOSIS — E039 Hypothyroidism, unspecified: Secondary | ICD-10-CM | POA: Insufficient documentation

## 2017-08-30 DIAGNOSIS — Z79899 Other long term (current) drug therapy: Secondary | ICD-10-CM | POA: Insufficient documentation

## 2017-08-30 DIAGNOSIS — J45909 Unspecified asthma, uncomplicated: Secondary | ICD-10-CM | POA: Insufficient documentation

## 2017-08-30 DIAGNOSIS — Z888 Allergy status to other drugs, medicaments and biological substances status: Secondary | ICD-10-CM | POA: Insufficient documentation

## 2017-08-30 DIAGNOSIS — E071 Dyshormogenetic goiter: Secondary | ICD-10-CM

## 2017-08-30 DIAGNOSIS — F209 Schizophrenia, unspecified: Secondary | ICD-10-CM | POA: Insufficient documentation

## 2017-08-30 DIAGNOSIS — Z881 Allergy status to other antibiotic agents status: Secondary | ICD-10-CM | POA: Insufficient documentation

## 2017-08-30 DIAGNOSIS — K219 Gastro-esophageal reflux disease without esophagitis: Secondary | ICD-10-CM | POA: Insufficient documentation

## 2017-08-30 DIAGNOSIS — G8929 Other chronic pain: Secondary | ICD-10-CM | POA: Insufficient documentation

## 2017-08-30 DIAGNOSIS — Z885 Allergy status to narcotic agent status: Secondary | ICD-10-CM | POA: Insufficient documentation

## 2017-08-30 DIAGNOSIS — F419 Anxiety disorder, unspecified: Secondary | ICD-10-CM | POA: Insufficient documentation

## 2017-08-30 DIAGNOSIS — M419 Scoliosis, unspecified: Secondary | ICD-10-CM | POA: Insufficient documentation

## 2017-08-30 MED ORDER — FLUOXETINE HCL 20 MG PO TABS: 20.0000 mg | ORAL_TABLET | Freq: Every day | ORAL | 3 refills | Status: DC

## 2017-08-30 MED ORDER — LEVOTHYROXINE SODIUM 200 MCG PO TABS: 200.0000 ug | ORAL_TABLET | Freq: Every day | ORAL | 3 refills | Status: DC

## 2017-08-30 MED ORDER — OMEPRAZOLE 20 MG PO CPDR: 20.0000 mg | DELAYED_RELEASE_CAPSULE | Freq: Every day | ORAL | 3 refills | Status: DC

## 2017-08-30 MED FILL — OMEPRAZOLE 20 MG CAP: 20 | 30 days supply | Qty: 30 | Fill #0

## 2017-08-30 MED FILL — FLUoxetine HCL 20 MG TABS: 20 | 30 days supply | Qty: 30 | Fill #0

## 2017-08-30 MED FILL — LEVOTHYROXINE 200 MCG TAB: 200 | 30 days supply | Qty: 30 | Fill #0

## 2017-08-30 NOTE — Progress Notes (Signed)
Patient is having swelling in throat, stomach, legs and arms.  Patient has not had medication in 1 month.

## 2017-08-30 NOTE — Progress Notes (Signed)
Subjective:  Patient ID: Kimberly [name], female    DOB: 07-Mar-1990  Age: 27 y.o. MRN: [id number]  CC: Hypothyroidism   HPI Kimberly [name] is a 27 year old female with a history of anxiety and depression, hypothyroidism, chronic low back pain who presents today for a follow-up visit. Has been out of her levothyroxine and Prozac and is requesting refills today.  She complains of swelling of her arms, legs and her throat to the point where she "does not have a neck" but denies any feeling of closing of her throat, laryngeal edema, lip edema, wheezing.  Symptoms last for 2 to 3 days then resolved but none are related to her periods.  This is associated with abdominal bloating but no abdominal pain.  He sometimes has dyspnea with this but denies orthopnea or paroxysmal nocturnal dyspnea. She denies history of allergies to food and has had no recent introduction of new foods or creams. Symptoms absent at this time.  Past Medical History:  Diagnosis Date  . Anxiety   . Asthma    "grew out of it" (11/16/2012)  . Chronic back pain   . Complication of anesthesia    "takes a lot to put me to sleep; woke up completely mid endoscopy" (11/16/2012)  . Depression   . Dysrhythmia    ST (11/16/2012)  . Gastric reflux   . 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 Iron deficiency anemia   . Migraines    "weekly" (11/16/2012)  . Ovarian cyst   . Pregnancy induced hypertension   . Schizophrenia ([place])    "moderate" (11/16/2012)  . Scoliosis     Past Surgical History:  Procedure Laterality Date  . LAPAROSCOPIC OVARIAN CYSTECTOMY Left 02/11/2016   Procedure: LAPAROSCOPIC OVARIAN CYSTECTOMY;  Surgeon: Emily Filbert, MD;  Location: [facility];  Service: Gynecology;  Laterality: Left;  . UPPER GI ENDOSCOPY  ~ 2006; ~2008    Allergies  Allergen Reactions  . Sulfa Antibiotics Swelling  . Abilify [Aripiprazole] Other (See Comments)    Lethargy, unable to function  . Amoxicillin-Pot  Clavulanate Other (See Comments)    Stomach pains Has patient had a PCN reaction causing immediate rash, facial/tongue/throat swelling, SOB or lightheadedness with hypotension: No Has patient had a PCN reaction causing severe rash involving mucus membranes or skin necrosis: No Has patient had a PCN reaction that required hospitalization No Has patient had a PCN reaction occurring within the last 10 years: Yes If all of the above answers are "NO", then may proceed with Cephalosporin use.   . Cephalexin Other (See Comments)    Stomach pains Pt taking Cefadroxil as outpatient  . Ferrous Sulfate Nausea And Vomiting  . Latex Other (See Comments)    Swelling, burning of skin .   Marland Kitchen Tape Other (See Comments)    Swelling, burning of skin .   Marland Kitchen Zicam Cold Remedy [Erysidoron #1] Nausea And Vomiting  . Keflex [Cephalexin] Other (See Comments)    Rash--able to take w/ Benadryl  . Percocet [Oxycodone-Acetaminophen] Other (See Comments)    Per pt- makes her very sleepy, feels like she isn't getting her breath. States she does not have throat/tongue swelling or anaphylaxis.  . Vicodin [Hydrocodone-Acetaminophen] Hives and Rash     Outpatient Medications Prior to Visit  Medication Sig Dispense Refill  . traMADol (ULTRAM) 50 MG tablet Take 1 tablet (50 mg total) by mouth every 12 (twelve) hours as needed. 60 tablet 1  . FLUoxetine (PROZAC) 20 MG tablet  Take 1 tablet (20 mg total) by mouth daily. 30 tablet 3  . levothyroxine (SYNTHROID, LEVOTHROID) 200 MCG tablet Take 1 tablet (200 mcg total) by mouth daily before breakfast. 30 tablet 3  . diphenoxylate-atropine (LOMOTIL) 2.5-0.025 MG tablet Take 1 tablet by mouth 3 (three) times daily as needed for diarrhea or loose stools. (Patient not taking: Reported on 08/30/2017) 30 tablet 0  . loperamide (IMODIUM A-D) 2 MG tablet Take 1 tablet (2 mg total) by mouth 4 (four) times daily as needed for diarrhea or loose stools. (Patient not taking: Reported on  08/30/2017) 30 tablet 0  . neomycin-polymyxin-hydrocortisone (CORTISPORIN) 3.5-10000-1 OTIC suspension Place 4 drops into the right ear 3 (three) times daily. (Patient not taking: Reported on 12/15/2016) 10 mL 0  . ondansetron (ZOFRAN ODT) 4 MG disintegrating tablet Take 1 tablet (4 mg total) by mouth every 8 (eight) hours as needed for nausea or vomiting. (Patient not taking: Reported on 08/30/2017) 10 tablet 1  . ondansetron (ZOFRAN ODT) 4 MG disintegrating tablet Take 1 tablet (4 mg total) by mouth every 8 (eight) hours as needed for nausea or vomiting. (Patient not taking: Reported on 08/30/2017) 8 tablet 0  . promethazine (PHENERGAN) 25 MG tablet Take 1 tablet (25 mg total) by mouth every 6 (six) hours as needed for nausea or vomiting. (Patient not taking: Reported on 08/30/2017) 12 tablet 1  . omeprazole (PRILOSEC) 20 MG capsule Take 1 capsule (20 mg total) by mouth daily. (Patient not taking: Reported on 08/30/2017) 30 capsule 3   No facility-administered medications prior to visit.     ROS Review of Systems  Constitutional: Negative for activity change, appetite change and fatigue.  HENT: Negative for congestion, sinus pressure and sore throat.   Eyes: Negative for visual disturbance.  Respiratory: Negative for cough, chest tightness, shortness of breath and wheezing.   Cardiovascular: Negative for chest pain and palpitations.  Gastrointestinal: Negative for abdominal distention, abdominal pain and constipation.  Endocrine: Negative for polydipsia.  Genitourinary: Negative for dysuria and frequency.  Musculoskeletal: Negative for arthralgias and back pain.  Skin: Negative for rash.  Neurological: Negative for tremors, light-headedness and numbness.  Hematological: Does not bruise/bleed easily.  Psychiatric/Behavioral: Negative for agitation and behavioral problems.    Objective:  BP 131/88   Pulse (!) 108   Temp 98 F (36.7 C) (Oral)   Ht 5\' 4"  (1.626 m)   Wt 269 lb 3.2 oz (122.1  kg)   SpO2 97%   BMI 46.21 kg/m   BP/Weight 08/30/2017 04/16/2017 4/40/1027  Systolic BP 253 664 403  Diastolic BP 88 82 96  Wt. (Lbs) 269.2 - 260  BMI 46.21 - 44.63  Some encounter information is confidential and restricted. Go to Review Flowsheets activity to see all data.      Physical Exam  Constitutional: She is oriented to person, place, and time. She appears well-developed and well-nourished.  Cardiovascular: Normal rate, normal heart sounds and intact distal pulses.  No murmur heard. Pulmonary/Chest: Effort normal and breath sounds normal. She has no wheezes. She has no rales. She exhibits no tenderness.  Abdominal: Soft. Bowel sounds are normal. She exhibits no distension and no mass. There is no tenderness.  Musculoskeletal: Normal range of motion.  Neurological: She is alert and oriented to person, place, and time.  Skin: Skin is warm and dry.  Psychiatric: She has a normal mood and affect.     Assessment & Plan:   1. 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  Dispense: 30 tablet; Refill: 3  2. Anxiety and depression Stable - FLUoxetine (PROZAC) 20 MG tablet; Take 1 tablet (20 mg total) by mouth daily.  Dispense: 30 tablet; Refill: 3  3. Gastroesophageal reflux disease without esophagitis Controlled - omeprazole (PRILOSEC) 20 MG capsule; Take 1 capsule (20 mg total) by mouth daily.  Dispense: 30 capsule; Refill: 3  4. Edema, unspecified type Unknown etiology Edema not evident on my exam today - Brain natriuretic peptide - Cortisol - ACTH - FSH/LH - Allergy Panel 19, Seafood Group - Allergy Panel, Animal Group - Allergy Panel 16, Vegetable Group   Meds ordered this encounter  Medications  . levothyroxine (SYNTHROID,  LEVOTHROID) 200 MCG tablet    Sig: Take 1 tablet (200 mcg total) by mouth daily before breakfast.    Dispense:  30 tablet    Refill:  3  . FLUoxetine (PROZAC) 20 MG tablet    Sig: Take 1 tablet (20 mg total) by mouth daily.    Dispense:  30 tablet    Refill:  3  . omeprazole (PRILOSEC) 20 MG capsule    Sig: Take 1 capsule (20 mg total) by mouth daily.    Dispense:  30 capsule    Refill:  3    Follow-up: Return in about 6 weeks (around 10/11/2017) for follow up on edema.   Charlott Rakes MD

## 2017-09-02 LAB — ALLERGY PANEL 19, SEAFOOD GROUP
Catfish: <0.1 kU/L
Codfish IgE: <0.1 kU/L
F023-IgE Crab: <0.1 kU/L
F080-IgE Lobster: <0.1 kU/L
Shrimp IgE: <0.1 kU/L

## 2017-09-02 LAB — ALLERGY PANEL, ANIMAL GROUP
Chicken Feathers IgE: <0.1 kU/L
Goose Feathers IgE: <0.1 kU/L
Mouse Urine IgE: <0.1 kU/L

## 2017-09-02 LAB — ALLERGEN PROFILE, VEGETABLE I
Allergen Lettuce IgE: <0.1 kU/L
F214-IgE Spinach: <0.1 kU/L

## 2017-09-02 LAB — FSH/LH
FSH: 5.2 m[IU]/mL
LH: 13.6 m[IU]/mL

## 2017-09-02 LAB — BRAIN NATRIURETIC PEPTIDE: BNP: 10 pg/mL (ref 0.0–100.0)

## 2017-09-02 LAB — CORTISOL: Cortisol: 13.2 ug/dL

## 2017-09-02 LAB — ACTH: ACTH: 19.3 pg/mL (ref 7.2–63.3)

## 2017-09-02 LAB — SPECIMEN STATUS REPORT

## 2017-10-11 MED FILL — LEVOTHYROXINE 200 MCG TAB: 200 | 30 days supply | Qty: 30 | Fill #1

## 2017-10-11 MED FILL — FLUoxetine HCL 20 MG TABS: 20 | 30 days supply | Qty: 30 | Fill #1

## 2017-10-11 MED FILL — OMEPRAZOLE 20 MG CAP: 20 | 30 days supply | Qty: 30 | Fill #1

## 2017-11-03 ENCOUNTER — Ambulatory Visit: Payer: Self-pay | Admitting: Family Medicine

## 2017-11-14 IMAGING — US US MFM OB FOLLOW-UP
1 series · 14 of 28 positions shown · non-contrast
Comparison: none

[Series 1: us mfm ob follow-up · 14 of 60 slices shown]
[im 3/60]
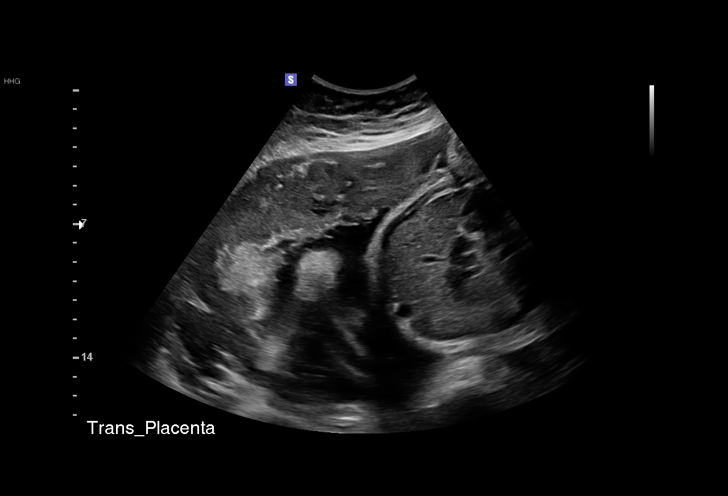
[im 7/60]
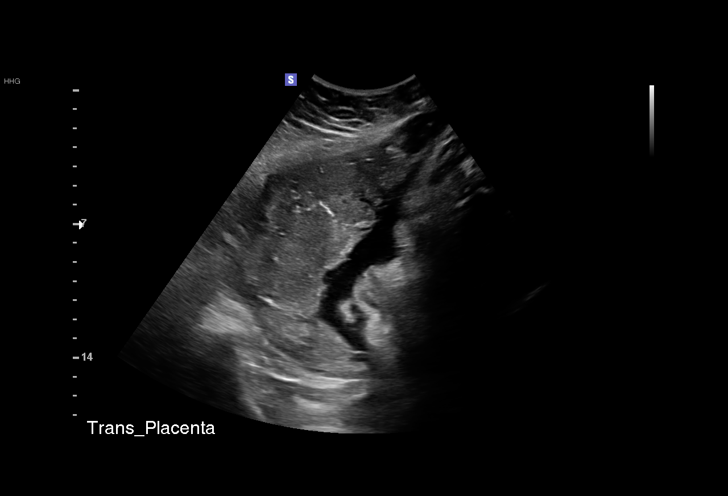
[im 11/60]
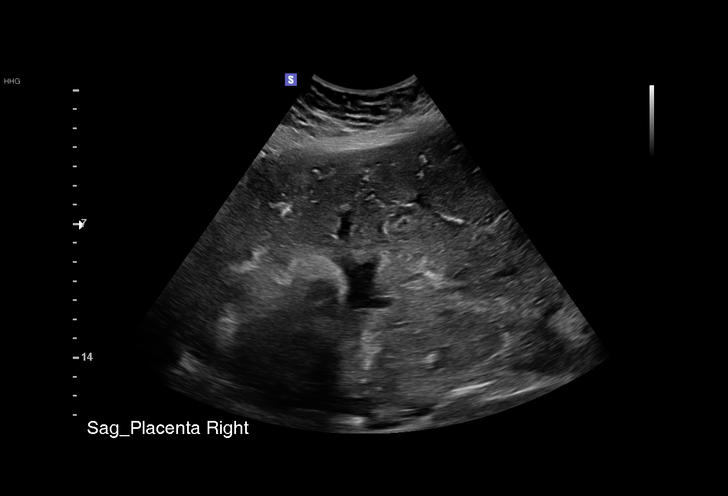
[im 16/60]
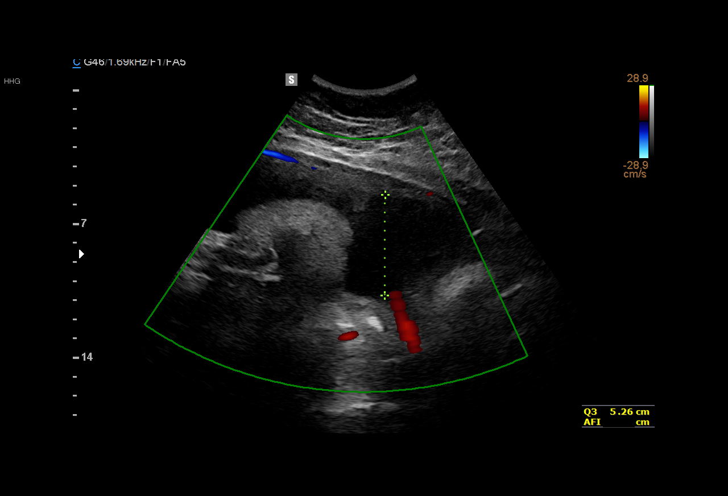
[im 20/60]
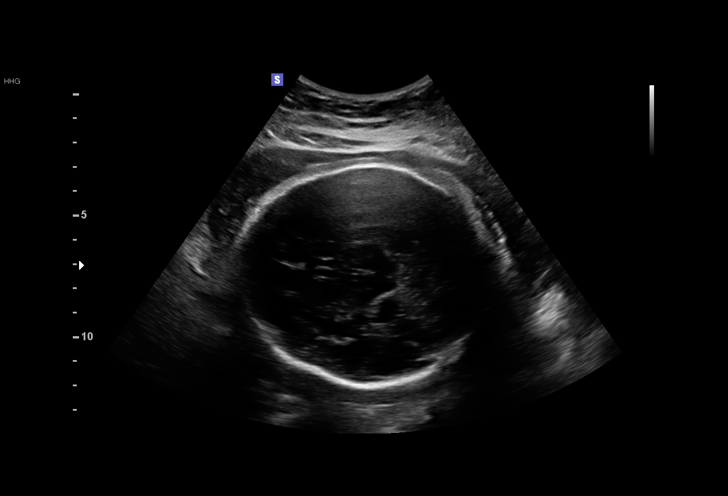
[im 25/60]
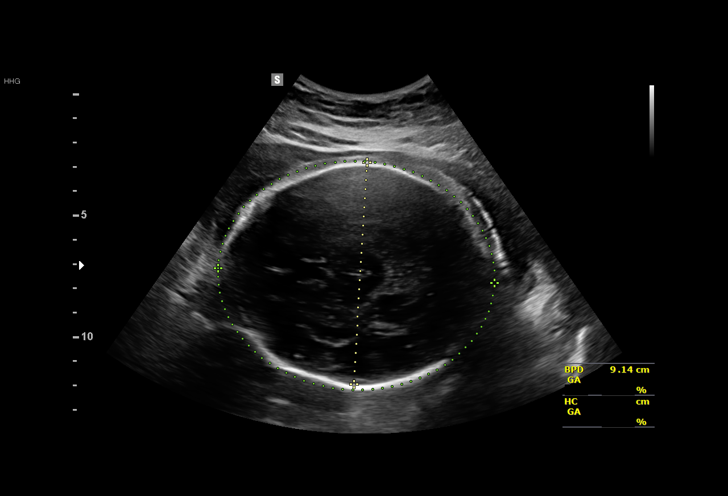
[im 29/60]
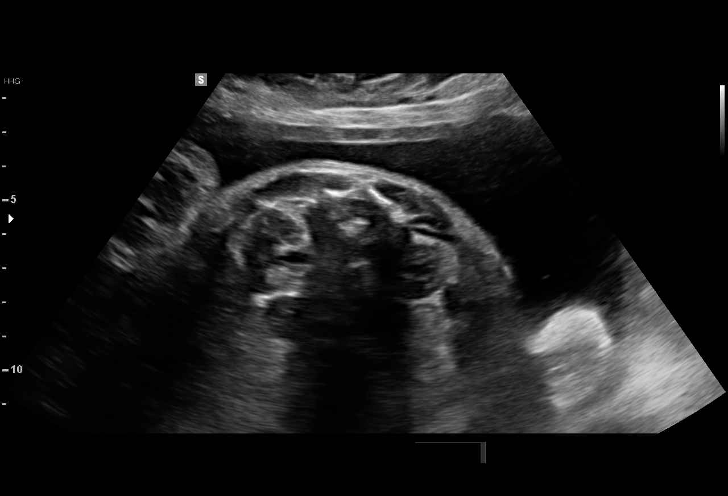
[im 33/60]
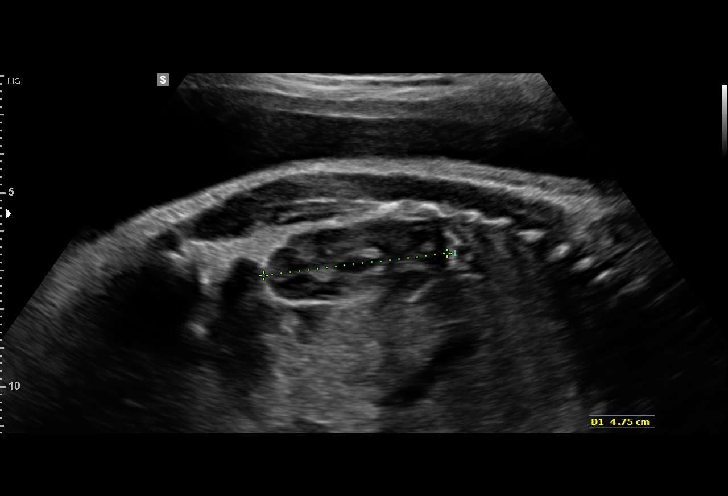
[im 38/60]
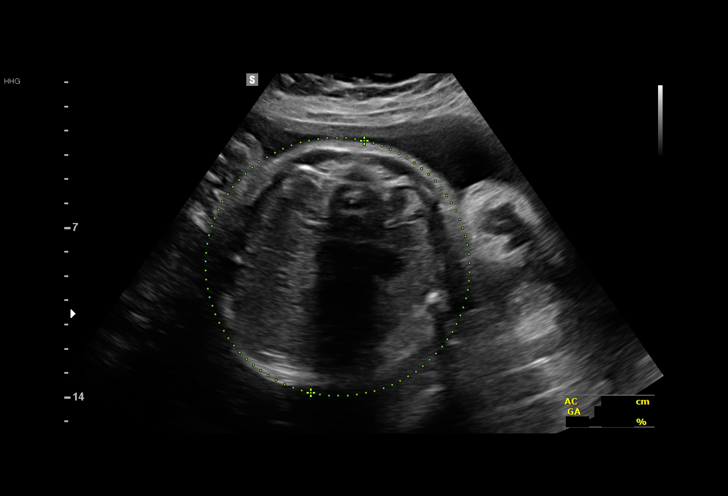
[im 42/60]
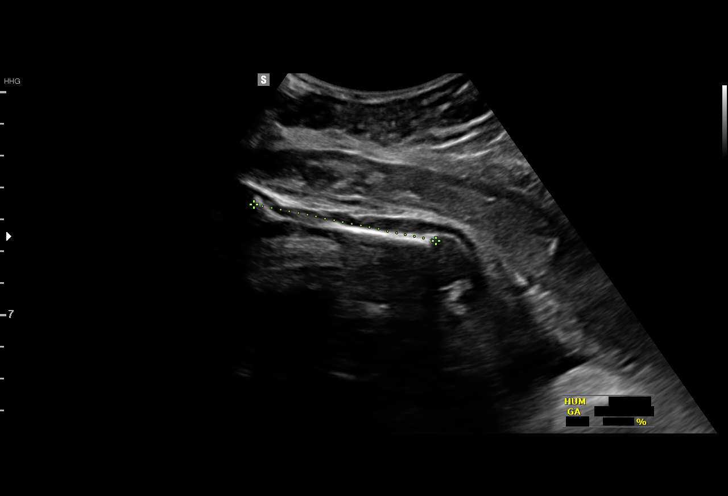
[im 46/60]
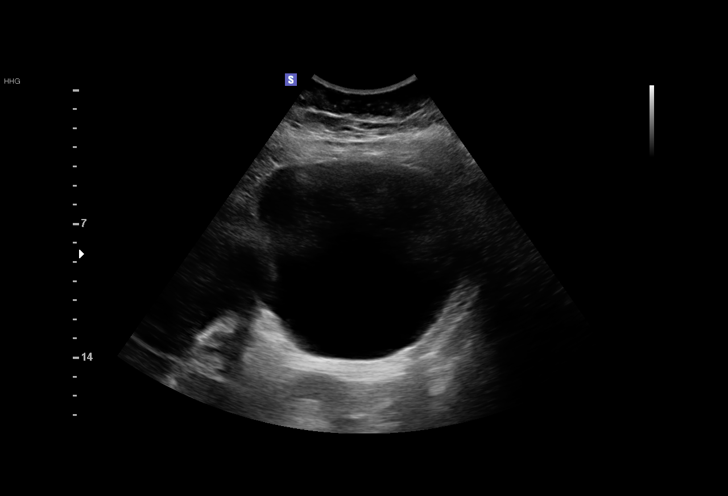
[im 51/60]
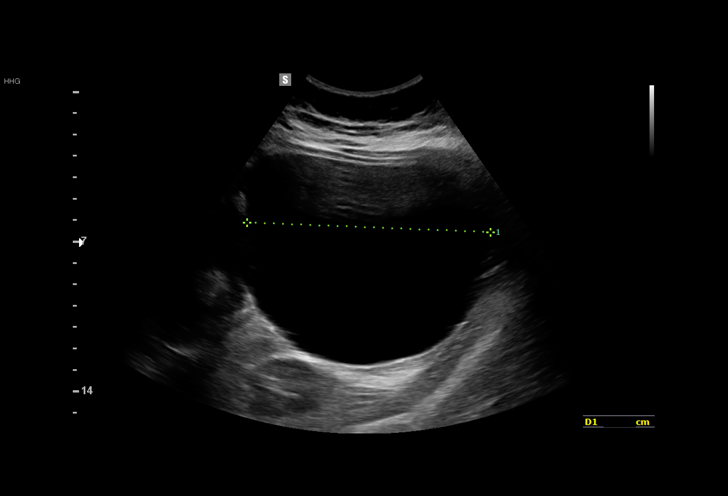
[im 55/60]
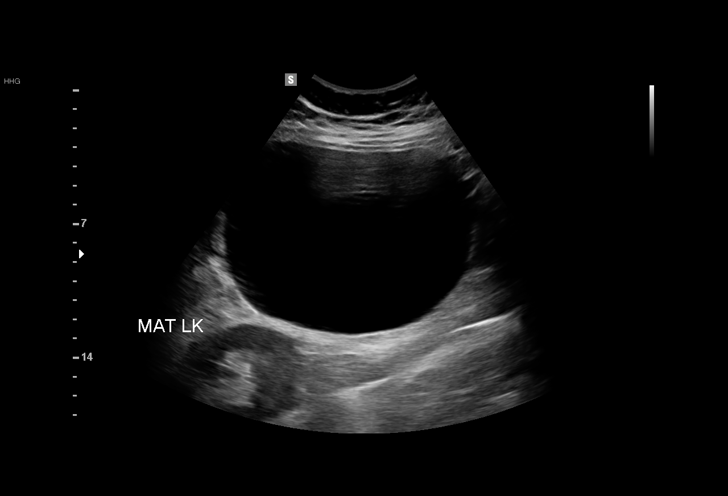
[im 60/60]
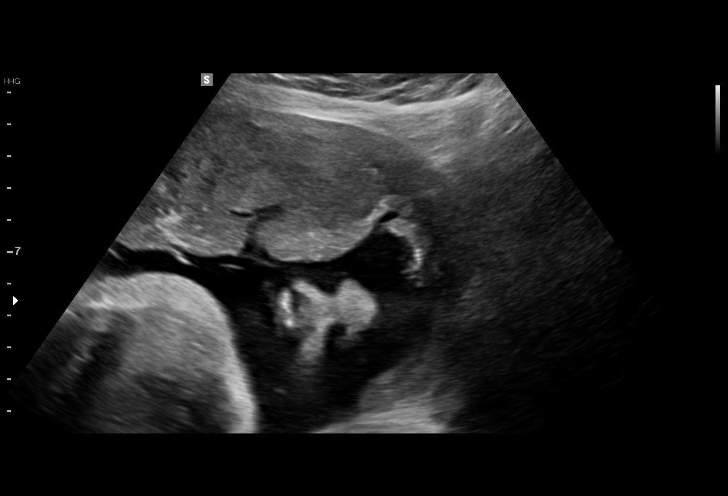

[14 of 28 positions shown; findings below may reference images not displayed]

1  FELNER CEOLA            [id number]      [id number]     [id number]
Indications

35 weeks gestation of pregnancy
Pre-existing essential hypertension
complicating pregnancy, third trimester
Obesity complicating pregnancy, third
trimester
Thyroid disease in pregnancy                   O99.280,
OB History

Blood Type:            Height:  5'4"   Weight (lb):  247       BMI:
Gravidity:    2          SAB:   1
Living:       0
Fetal Evaluation

Num Of Fetuses:     1
Fetal Heart         144
Rate(bpm):
Cardiac Activity:   Observed
Presentation:       Cephalic
Placenta:           Right lateral, above cervical os
P. Cord Insertion:  Visualized, central

Amniotic Fluid
AFI FV:      Subjectively within normal limits

AFI Sum(cm)     %Tile       Largest Pocket(cm)
18.72           70

RUQ(cm)       RLQ(cm)       LUQ(cm)        LLQ(cm)
4.25
Biometry

BPD:      91.7  mm     G. Age:  37w 2d         91  %    CI:         77.81  %    70 - 86
FL/HC:       20.4  %    20.1 -
HC:        329  mm     G. Age:  37w 3d         63  %    HC/AC:       0.98       0.93 -
AC:      334.1  mm     G. Age:  37w 2d         93  %    FL/BPD:      73.1  %    71 - 87
FL:         67  mm     G. Age:  34w 4d         19  %    FL/AC:       20.1  %    20 - 24
HUM:      59.5  mm     G. Age:  34w 4d         44  %

Est. FW:    4477   gm     6 lb 9 oz     81  %
Gestational Age

LMP:           35w 4d        Date:  10/29/14                 EDD:    08/05/15
U/S Today:     36w 5d                                        EDD:    07/28/15
Best:          35w 4d     Det. By:  LMP  (10/29/14)          EDD:    08/05/15
Anatomy

Cranium:               Appears normal         Aortic Arch:            Previously seen
Cavum:                 Appears normal         Ductal Arch:            Previously seen
Ventricles:            Appears normal         Diaphragm:              Appears normal
Choroid Plexus:        Appears normal         Stomach:                Appears normal, left
sided
Cerebellum:            Previously seen        Abdomen:                Appears normal
Posterior Fossa:       Previously seen        Abdominal Wall:         Previously seen
Nuchal Fold:           Not applicable (>20    Cord Vessels:           Previously seen
wks GA)
Face:                  Orbits and profile     Kidneys:                Appear normal
previously seen
Lips:                  Appears normal         Bladder:                Appears normal
Thoracic:              Appears normal         Spine:                  Previously seen
Heart:                 Appears normal         Upper Extremities:      Previously seen
(4CH, axis, and
situs)
RVOT:                  Previously seen        Lower Extremities:      Previously seen
LVOT:                  Previously seen

Other:  Fetus appears to be a male. Nasal bone previously seen. Technicallly
difficult due to advanced GA and maternal habitus.
Cervix Uterus Adnexa

Cervix
Not visualized (advanced GA >64wks)

Uterus
No abnormality visualized.

Left Ovary
Large simple cyst seen. See comments.

Right Ovary
Not visualized.
Comments

Left ovarian cyst 13 X 11.4 X 9.6cm
Impression

SIUP at 35+4 weeks
Normal interval anatomy; anatomic survey complete
Normal amniotic fluid volume
Appropriate interval growth with EFW at the 81st %tile
Recommendations

Follow-up as clinically indicated
Follow-up adnexal cyst postpartum

## 2017-11-23 ENCOUNTER — Other Ambulatory Visit: Payer: Self-pay

## 2017-11-23 ENCOUNTER — Encounter (HOSPITAL_COMMUNITY): Payer: Self-pay

## 2017-11-23 ENCOUNTER — Emergency Department (HOSPITAL_COMMUNITY)
Admission: EM | Admit: 2017-11-23 | Discharge: 2017-11-23 | Disposition: A | Discharge status: Home / Self Care | Payer: Medicaid Other | Attending: Emergency Medicine | Admitting: Emergency Medicine

## 2017-11-23 DIAGNOSIS — Z91040 Latex allergy status: Secondary | ICD-10-CM | POA: Insufficient documentation

## 2017-11-23 DIAGNOSIS — M542 Cervicalgia: Secondary | ICD-10-CM | POA: Insufficient documentation

## 2017-11-23 DIAGNOSIS — Y939 Activity, unspecified: Secondary | ICD-10-CM | POA: Insufficient documentation

## 2017-11-23 DIAGNOSIS — I10 Essential (primary) hypertension: Secondary | ICD-10-CM | POA: Insufficient documentation

## 2017-11-23 DIAGNOSIS — E039 Hypothyroidism, unspecified: Secondary | ICD-10-CM | POA: Insufficient documentation

## 2017-11-23 DIAGNOSIS — Y92410 Unspecified street and highway as the place of occurrence of the external cause: Secondary | ICD-10-CM | POA: Insufficient documentation

## 2017-11-23 DIAGNOSIS — J45909 Unspecified asthma, uncomplicated: Secondary | ICD-10-CM | POA: Insufficient documentation

## 2017-11-23 DIAGNOSIS — Z79899 Other long term (current) drug therapy: Secondary | ICD-10-CM | POA: Insufficient documentation

## 2017-11-23 DIAGNOSIS — Y998 Other external cause status: Secondary | ICD-10-CM | POA: Insufficient documentation

## 2017-11-23 MED ORDER — METHOCARBAMOL 500 MG PO TABS: 500.0000 mg | ORAL_TABLET | Freq: Two times a day (BID) | ORAL | 0 refills | Status: DC
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
## 2017-11-23 NOTE — ED Notes (Signed)
Urine cup given to patient.

## 2017-11-23 NOTE — ED Notes (Signed)
Pt stable, ambulatory, states understanding of discharge instructions 
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
## 2017-12-22 MED FILL — FLUoxetine HCL 20 MG TABS: 20 | 30 days supply | Qty: 30 | Fill #2

## 2017-12-22 MED FILL — OMEPRAZOLE 20 MG CAP: 20 | 30 days supply | Qty: 30 | Fill #2

## 2017-12-22 MED FILL — LEVOTHYROXINE 200 MCG TAB: 200 | 30 days supply | Qty: 30 | Fill #2

## 2018-01-21 ENCOUNTER — Encounter: Payer: Self-pay | Admitting: Family Medicine

## 2018-01-21 ENCOUNTER — Ambulatory Visit: Payer: Self-pay | Attending: Family Medicine | Admitting: Family Medicine

## 2018-01-21 VITALS — BP 110/75 | HR 88 | Temp 99.0°F | Resp 18 | Ht 64.0 in | Wt 274.0 lb

## 2018-01-21 DIAGNOSIS — Z91040 Latex allergy status: Secondary | ICD-10-CM | POA: Insufficient documentation

## 2018-01-21 DIAGNOSIS — J029 Acute pharyngitis, unspecified: Secondary | ICD-10-CM

## 2018-01-21 DIAGNOSIS — F329 Major depressive disorder, single episode, unspecified: Secondary | ICD-10-CM | POA: Insufficient documentation

## 2018-01-21 DIAGNOSIS — R112 Nausea with vomiting, unspecified: Secondary | ICD-10-CM

## 2018-01-21 DIAGNOSIS — Z885 Allergy status to narcotic agent status: Secondary | ICD-10-CM | POA: Insufficient documentation

## 2018-01-21 DIAGNOSIS — Z1331 Encounter for screening for depression: Secondary | ICD-10-CM

## 2018-01-21 DIAGNOSIS — Z882 Allergy status to sulfonamides status: Secondary | ICD-10-CM | POA: Insufficient documentation

## 2018-01-21 DIAGNOSIS — Z888 Allergy status to other drugs, medicaments and biological substances status: Secondary | ICD-10-CM | POA: Insufficient documentation

## 2018-01-21 DIAGNOSIS — Z880 Allergy status to penicillin: Secondary | ICD-10-CM | POA: Insufficient documentation

## 2018-01-21 DIAGNOSIS — Z881 Allergy status to other antibiotic agents status: Secondary | ICD-10-CM | POA: Insufficient documentation

## 2018-01-21 LAB — POCT INFLUENZA A/B
Influenza A, POC: NEGATIVE
Influenza B, POC: NEGATIVE

## 2018-01-21 LAB — POCT RAPID STREP A (OFFICE): Rapid Strep A Screen: NEGATIVE

## 2018-01-21 MED ORDER — ONDANSETRON HCL 4 MG PO TABS: 4.0000 mg | ORAL_TABLET | Freq: Three times a day (TID) | ORAL | 0 refills | Status: DC | PRN

## 2018-01-21 MED ORDER — AZITHROMYCIN 250 MG PO TABS: ORAL_TABLET | ORAL | 0 refills | Status: DC

## 2018-01-21 MED FILL — AZITHROMYCIN 250 MG TABLET: 250 | 5 days supply | Qty: 6 | Fill #0

## 2018-01-21 MED FILL — ONDANSETRON HCL 4 MG TABLET: 4 | 6 days supply | Qty: 20 | Fill #0

## 2018-01-21 NOTE — Progress Notes (Signed)
Subjective:    Patient ID: Kimberly [name], female    DOB: [birth year], 28 y.o.   MRN: [id number]  HPI       28 yo female with complaint of sore throat left sudden onset about 3 days ago which has remained intense.  Sore throat is about a 7 on a 0-to-10 scale.  Sore throat is a burning sensation which is worse with swallowing but patient does not have difficulty swallowing other than secondary to pain.  Patient has had an occasional top of the head/whole head dull headache.  Patient reports throwing up last night.  Vomiting was not related to the time of a meal.  Patient denies any rash.  Patient has felt feverish at times.  Patient denies shortness of breath.  Patient has had occasional nonproductive cough.  Patient has had some mild nasal congestion.  She does not really feel as if she is having postnasal drainage.  Past Medical History:  Diagnosis Date  . Anxiety   . Asthma    "grew out of it" (11/16/2012)  . Chronic back pain   . Complication of anesthesia    "takes a lot to put me to sleep; woke up completely mid endoscopy" (11/16/2012)  . Depression   . Dysrhythmia    ST (11/16/2012)  . Gastric reflux   . 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 Iron deficiency anemia   . Migraines    "weekly" (11/16/2012)  . Ovarian cyst   . Pregnancy induced hypertension   . Schizophrenia ([place])    "moderate" (11/16/2012)  . Scoliosis    Past Surgical History:  Procedure Laterality Date  . LAPAROSCOPIC OVARIAN CYSTECTOMY Left 02/11/2016   Procedure: LAPAROSCOPIC OVARIAN CYSTECTOMY;  Surgeon: Emily Filbert, MD;  Location: [facility];  Service: Gynecology;  Laterality: Left;  . UPPER GI ENDOSCOPY  ~ 2006; ~2008   Social History   Tobacco Use  . Smoking status: Never Smoker  . Smokeless tobacco: Never Used  Substance Use Topics  . Alcohol use: Yes    Comment: very rare  . Drug use: No   Allergies  Allergen Reactions  . Sulfa Antibiotics Swelling  . Abilify [Aripiprazole]  Other (See Comments)    Lethargy, unable to function  . Amoxicillin-Pot Clavulanate Other (See Comments)    Stomach pains Has patient had a PCN reaction causing immediate rash, facial/tongue/throat swelling, SOB or lightheadedness with hypotension: No Has patient had a PCN reaction causing severe rash involving mucus membranes or skin necrosis: No Has patient had a PCN reaction that required hospitalization No Has patient had a PCN reaction occurring within the last 10 years: Yes If all of the above answers are "NO", then may proceed with Cephalosporin use.   . Cephalexin Other (See Comments)    Stomach pains Pt taking Cefadroxil as outpatient  . Ferrous Sulfate Nausea And Vomiting  . Latex Other (See Comments)    Swelling, burning of skin .   Marland Kitchen Tape Other (See Comments)    Swelling, burning of skin .   Marland Kitchen Zicam Cold Remedy [Erysidoron #1] Nausea And Vomiting  . Keflex [Cephalexin] Other (See Comments)    Rash--able to take w/ Benadryl  . Percocet [Oxycodone-Acetaminophen] Other (See Comments)    Per pt- makes her very sleepy, feels like she isn't getting her breath. States she does not have throat/tongue swelling or anaphylaxis.  . Vicodin [Hydrocodone-Acetaminophen] Hives and Rash      Review of Systems  Constitutional: Positive for  fatigue and fever. Negative for chills.  HENT: Positive for congestion, rhinorrhea and sore throat. Negative for ear discharge, ear pain, postnasal drip, sinus pressure, sinus pain and trouble swallowing.   Eyes: Negative for discharge, itching and visual disturbance.  Respiratory: Positive for cough. Negative for shortness of breath.   Cardiovascular: Negative for chest pain, palpitations and leg swelling.  Gastrointestinal: Positive for nausea and vomiting. Negative for abdominal pain.  Genitourinary: Negative for dysuria and frequency.  Musculoskeletal: Positive for back pain. Negative for gait problem.  Neurological: Positive for headaches.  Negative for dizziness.  Hematological: Positive for adenopathy (feels like she has enlarged lymph nodes in her neck after onset of sore throat).       Objective:   Physical Exam BP 110/75 (BP Location: Left Arm, Patient Position: Sitting, Cuff Size: Normal)   Pulse 88   Temp 99 F (37.2 C) (Oral)   Resp 18   Ht 5\' 4"  (1.626 m)   Wt 274 lb (124.3 kg)   LMP 12/22/2017   SpO2 98%   BMI 47.03 kg/m Nurse's notes and vital signs reviewed General- well-nourished, well-developed obese young adult female in no acute distress but patient appears fatigued and has a nasal quality to her voice ENT- TMs dull bilaterally, nares with moderate edema of the nasal turbinates/nasal mucosa with mild clear nasal discharge, patient with posterior pharynx/tonsillar arch edema/erythema with possible exudate on the left Neck-supple, patient with tender left-sided anterior cervical chain lymphadenopathy Lungs-clear to auscultation bilaterally Cardiovascular-regular rate and rhythm Abdomen-soft, nontender, normal bowel sounds Back-no CVA tenderness        Assessment & Plan:  1. Sore throat Patient with complaint of recent onset of sore throat.  Patient states that she has also had some nausea and vomiting along with a sore throat.  Patient had negative rapid strep test however patient did have what appeared to be some exudate on exam therefore in case of false negative test and with patient's symptoms, prescription provided for azithromycin Z-Pak.  Patient may take over-the-counter pain medication as needed for sore throat and also gargle with warm salt water.  Patient can take over-the-counter medication as needed for congestion. - Rapid Strep A - azithromycin (ZITHROMAX) 250 MG tablet; Take 2 pills the first day then one pill daily for 4 days  Dispense: 6 tablet; Refill: 0  2. Non-intractable vomiting with nausea, unspecified vomiting type Patient provided with prescription for Zofran to take as needed  for nausea or vomiting.  Patient should also continue the use of her omeprazole.  Patient is encouraged to remain well-hydrated.  Patient also had testing for influenza which was negative. - ondansetron (ZOFRAN) 4 MG tablet; Take 1 tablet (4 mg total) by mouth every 8 (eight) hours as needed for nausea or vomiting.  Dispense: 20 tablet; Refill: 0 - Influenza A/B  3. Positive depression screening Patient with computer popup indicating abnormal PHQ 9 depression screen.  Patient appears to currently be on treatment with fluoxetine.  Referral will be placed for social worker to follow-up with the patient regarding her depression screen. - Ambulatory referral to Social Work  An After Visit Summary was printed and given to the patient.  Return if symptoms worsen or fail to improve, for next week if not better; needs work note for today.

## 2018-02-14 ENCOUNTER — Telehealth: Payer: Self-pay | Admitting: Licensed Clinical Social Worker

## 2018-02-14 NOTE — Telephone Encounter (Signed)
Call placed to patient to follow up on behavioral health consult from Dr. Chapman Fitch. LCSWA introduced self and explained role at [facility]. Pt agreed to schedule appointment with [facility] for 02/17/2018.

## 2018-02-17 ENCOUNTER — Institutional Professional Consult (permissible substitution): Payer: Self-pay | Admitting: Licensed Clinical Social Worker

## 2018-03-14 ENCOUNTER — Ambulatory Visit: Payer: Self-pay | Attending: Family Medicine | Admitting: Family Medicine

## 2018-03-14 ENCOUNTER — Encounter: Payer: Self-pay | Admitting: Family Medicine

## 2018-03-14 VITALS — BP 112/79 | HR 112 | Temp 98.9°F | Resp 18 | Ht 64.0 in | Wt 277.0 lb

## 2018-03-14 DIAGNOSIS — F419 Anxiety disorder, unspecified: Secondary | ICD-10-CM

## 2018-03-14 DIAGNOSIS — R51 Headache: Secondary | ICD-10-CM

## 2018-03-14 DIAGNOSIS — R5383 Other fatigue: Secondary | ICD-10-CM

## 2018-03-14 DIAGNOSIS — R509 Fever, unspecified: Secondary | ICD-10-CM

## 2018-03-14 DIAGNOSIS — K219 Gastro-esophageal reflux disease without esophagitis: Secondary | ICD-10-CM

## 2018-03-14 DIAGNOSIS — R519 Headache, unspecified: Secondary | ICD-10-CM

## 2018-03-14 DIAGNOSIS — J069 Acute upper respiratory infection, unspecified: Secondary | ICD-10-CM

## 2018-03-14 DIAGNOSIS — F32A Depression, unspecified: Secondary | ICD-10-CM

## 2018-03-14 DIAGNOSIS — E071 Dyshormogenetic goiter: Secondary | ICD-10-CM

## 2018-03-14 DIAGNOSIS — E039 Hypothyroidism, unspecified: Secondary | ICD-10-CM

## 2018-03-14 DIAGNOSIS — R112 Nausea with vomiting, unspecified: Secondary | ICD-10-CM

## 2018-03-14 DIAGNOSIS — F329 Major depressive disorder, single episode, unspecified: Secondary | ICD-10-CM

## 2018-03-14 LAB — POCT INFLUENZA A/B
Influenza A, POC: NEGATIVE
Influenza B, POC: NEGATIVE

## 2018-03-14 MED ORDER — OMEPRAZOLE 20 MG PO CPDR: 20.0000 mg | DELAYED_RELEASE_CAPSULE | Freq: Every day | ORAL | 1 refills | Status: DC

## 2018-03-14 MED ORDER — FLUOXETINE HCL 20 MG PO TABS: 20.0000 mg | ORAL_TABLET | Freq: Every day | ORAL | 1 refills | Status: DC

## 2018-03-14 MED ORDER — ONDANSETRON HCL 4 MG PO TABS: 4.0000 mg | ORAL_TABLET | Freq: Three times a day (TID) | ORAL | 0 refills | Status: DC | PRN

## 2018-03-14 MED ORDER — LEVOTHYROXINE SODIUM 200 MCG PO TABS: 200.0000 ug | ORAL_TABLET | Freq: Every day | ORAL | 1 refills | Status: DC

## 2018-03-14 MED FILL — FLUoxetine HCL 20 MG TABS: 20 | 30 days supply | Qty: 30 | Fill #0

## 2018-03-14 MED FILL — ONDANSETRON HCL 4 MG TABLET: 4 | 7 days supply | Qty: 20 | Fill #0

## 2018-03-14 MED FILL — OMEPRAZOLE 20 MG CAP: 20 | 30 days supply | Qty: 30 | Fill #0

## 2018-03-14 MED FILL — LEVOTHYROXINE 200 MCG TAB: 200 | 30 days supply | Qty: 30 | Fill #0

## 2018-03-14 NOTE — Patient Instructions (Signed)

## 2018-03-14 NOTE — Progress Notes (Signed)
Subjective:    Patient ID: Kimberly [name], female    DOB: May 10, 1990, 28 y.o.   MRN: [id number]  HPI       28 yo female who is seen as a same-day work in secondary to patient's complaint of sudden onset of not feeling well for the past 3 days and on Saturday, patient with fever of 101 per her home thermometer.  Patient also states that she has had a left-sided headache in the area above and in front of her left ear however she states that she has chronic issues with headaches.  Patient states that she feels as if she has had a daily headache for about 3 months.  Patient gets relief of headaches by taking Tylenol and naproxen together along with a caffeinated drink.  Patient states that she is trying not to take medication on a daily basis for headaches as she was told that this could worsen her headaches however she states that she currently does need to take something every other day to help with her headaches.  Patient denies any dizziness, no loss of balance and no visual changes.  Patient denies any hearing loss.  Patient has had no episodes of focal numbness or weakness or slurred speech.  Patient reports that she was diagnosed with migraines in the past.      Patient states that with her recent acute onset of fever she is also had nasal congestion with postnasal drainage.  Patient denies any cough.  Patient also this weekend had nausea, vomiting x1 and has had recurrent diarrhea/upset stomach.  Patient also with decreased appetite.  Patient states that her boyfriend has also been sick but he has not sought any medical attention and he was sick for a few days prior to the onset of her symptoms.  Patient denies any abdominal pain.  No chest pain or palpitations.  Patient has not felt short of breath.  Patient has felt more achy than usual.  Patient also feels very fatigued.  Patient states that she tends to get very paranoid about her health.  Past Medical History:  Diagnosis Date  . Anxiety   .  Asthma    "grew out of it" (11/16/2012)  . Chronic back pain   . Complication of anesthesia    "takes a lot to put me to sleep; woke up completely mid endoscopy" (11/16/2012)  . Depression   . Dysrhythmia    ST (11/16/2012)  . Gastric reflux   . 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 Iron deficiency anemia   . Migraines    "weekly" (11/16/2012)  . Ovarian cyst   . Pregnancy induced hypertension   . Schizophrenia ([place])    "moderate" (11/16/2012)  . Scoliosis    Past Surgical History:  Procedure Laterality Date  . LAPAROSCOPIC OVARIAN CYSTECTOMY Left 02/11/2016   Procedure: LAPAROSCOPIC OVARIAN CYSTECTOMY;  Surgeon: Emily Filbert, MD;  Location: [facility];  Service: Gynecology;  Laterality: Left;  . UPPER GI ENDOSCOPY  ~ 2006; ~2008   Family History  Problem Relation Age of Onset  . Diabetes Mother   . Hypertension Mother   . Vision loss Mother   . Hypertension Father   . Hyperlipidemia Father   . Thyroid disease Maternal Grandmother    Social History   Tobacco Use  . Smoking status: Never Smoker  . Smokeless tobacco: Never Used  Substance Use Topics  . Alcohol use: Yes    Comment: very rare  . Drug  use: No   Allergies  Allergen Reactions  . Sulfa Antibiotics Swelling  . Abilify [Aripiprazole] Other (See Comments)    Lethargy, unable to function  . Amoxicillin-Pot Clavulanate Other (See Comments)    Stomach pains Has patient had a PCN reaction causing immediate rash, facial/tongue/throat swelling, SOB or lightheadedness with hypotension: No Has patient had a PCN reaction causing severe rash involving mucus membranes or skin necrosis: No Has patient had a PCN reaction that required hospitalization No Has patient had a PCN reaction occurring within the last 10 years: Yes If all of the above answers are "NO", then may proceed with Cephalosporin use.   . Cephalexin Other (See Comments)    Stomach pains Pt taking Cefadroxil as outpatient  . Ferrous  Sulfate Nausea And Vomiting  . Latex Other (See Comments)    Swelling, burning of skin .   Marland Kitchen Tape Other (See Comments)    Swelling, burning of skin .   Marland Kitchen Zicam Cold Remedy [Erysidoron #1] Nausea And Vomiting  . Keflex [Cephalexin] Other (See Comments)    Rash--able to take w/ Benadryl  . Percocet [Oxycodone-Acetaminophen] Other (See Comments)    Per pt- makes her very sleepy, feels like she isn't getting her breath. States she does not have throat/tongue swelling or anaphylaxis.  . Vicodin [Hydrocodone-Acetaminophen] Hives and Rash      Review of Systems  Constitutional: Positive for chills, fatigue and fever.  HENT: Positive for congestion, postnasal drip and rhinorrhea. Negative for sinus pressure, sinus pain, sneezing, sore throat and trouble swallowing.   Eyes: Negative for photophobia and visual disturbance.  Respiratory: Negative for cough and shortness of breath.   Cardiovascular: Negative for chest pain, palpitations and leg swelling.  Gastrointestinal: Positive for diarrhea, nausea and vomiting. Negative for abdominal pain and blood in stool.  Endocrine: Negative for polydipsia, polyphagia and polyuria.  Genitourinary: Negative for dysuria and frequency.  Musculoskeletal: Positive for arthralgias and myalgias.  Skin: Negative for color change and rash.  Neurological: Positive for light-headedness and headaches. Negative for dizziness.  Hematological: Negative for adenopathy. Does not bruise/bleed easily.  Psychiatric/Behavioral: Negative for self-injury and suicidal ideas. The patient is nervous/anxious.        Objective:   Physical Exam BP 112/79 (BP Location: Left Arm, Patient Position: Sitting, Cuff Size: Normal)   Pulse (!) 112   Temp 98.9 F (37.2 C) (Oral)   Resp 18   Ht 5\' 4"  (1.626 m)   Wt 277 lb (125.6 kg)   LMP 03/09/2018   SpO2 98%   BMI 47.55 kg/m Nurse's notes and vital signs reviewed General-well-nourished, well-developed/obese young adult female in  no acute distress ENT-TMs dull, nares with moderate edema of the nasal turbinates with clear nasal discharge, patient with mild posterior pharynx/tonsillar arch edema/erythema Neck-supple, no appreciable lymphadenopathy Lungs-clear to auscultation bilaterally with mild decrease in breath sounds at the lung bases likely secondary to body habitus, breathing is nonlabored Cardiovascular- regular rate and regular rhythm at time of exam Abdomen- truncal obesity, normal to slightly hyperactive bowel sounds bowel sounds, soft and nontender Back-no CVA tenderness Neuro-cranial nerves II through XII grossly intact Psych- patient appeared slightly anxious at beginning of visit      Assessment & Plan:  1. Fever, unspecified fever cause Patient reports sudden onset of fever along with a sensation of chills and body aches this weekend and exposure to her boyfriend who has also been sick.  Symptoms are likely viral in nature due to sudden onset and patient will  be tested for influenza.  Patient should rest, remain well-hydrated and may take over-the-counter medications as needed for fever but should call or return to clinic if fever continues - Influenza A/B  2. Non-intractable vomiting with nausea, unspecified vomiting type Patient reports sudden onset of nausea/vomiting and diarrhea.  Patient still has some mild nausea but has only had emesis x1 this weekend.  Patient will be tested for influenza based on her symptoms and refill provided for Zofran which patient has used in the past without difficulty - Influenza A/B - ondansetron (ZOFRAN) 4 MG tablet; Take 1 tablet (4 mg total) by mouth every 8 (eight) hours as needed for nausea or vomiting.  Dispense: 20 tablet; Refill: 0  3. Fatigue, unspecified type Patient with complaint of feeling very fatigued and also having issues with worry about her health.  Patient with abnormal PHQ 9 screen and referral made to social work for follow-up - Ambulatory referral  to Social Work  4. Chronic daily headache Patient with complaint of chronic daily headache which is separate from sudden onset of illness this weekend.  Patient does state that she tends to get relief from taking Tylenol along with naproxen and a caffeinated drink.  Discussed possibility of rebound headaches with chronic use of nonsteroidal anti-inflammatories and patient also with some nasal congestion which may be contributing to her headaches and patient was advised to try an OTC medication for allergic rhinitis.  Patient did not have any neurologic abnormalities on exam.  Patient was asked to follow-up in the next few weeks regarding her headaches which patient reports were diagnosed as migraines in the past but patient may also have other factors contributing to her headaches.  5. Upper respiratory tract infection, unspecified type Patient with sudden onset of fever, chills, muscle aches, fatigue and symptoms are suggestive of influenza.  Patient had negative rapid flu test at today's visit but I discussed with the patient that she could still have a viral, flulike illness and patient should treat symptoms, remain hydrated.  Patient reports that she cannot take Tamiflu secondary to nausea and medication causing her to feel unwell in general.  Patient provided with work note and may return to work on Wednesday if she has been afebrile for 24 hours prior to work return.  If patient is not feeling better, or has continued fever she is encouraged to follow-up here but if she has any acute worsening of symptoms, patient may wish to go to urgent care/ED for further evaluation  6. Gastroesophageal reflux disease without esophagitis Patient out of medication and requested refill and lack of medication may be contributing to her nausea/vomiting - omeprazole (PRILOSEC) 20 MG capsule; Take 1 capsule (20 mg total) by mouth daily.  Dispense: 30 capsule; Refill: 1 - levothyroxine (SYNTHROID, LEVOTHROID) 200 MCG  tablet; Take 1 tablet (200 mcg total) by mouth daily before breakfast.  Dispense: 30 tablet; Refill: 1  7. Anxiety and depression Patient out of medication and requested refill at end of visit - FLUoxetine (PROZAC) 20 MG tablet; Take 1 tablet (20 mg total) by mouth daily.  Dispense: 30 tablet; Refill: 1  8. Hypothyroidism, unspecified type Patient reports being out of medication at end of visit and requests refill - levothyroxine (SYNTHROID, LEVOTHROID) 200 MCG tablet; Take 1 tablet (200 mcg total) by mouth daily before breakfast.  Dispense: 30 tablet; Refill: 1  An After Visit Summary was printed and given to the patient.  Return in about 2 weeks (around 03/28/2018) for headache.  As well as follow-up of chronic medical issues over the next 1 to 2 months

## 2018-03-16 ENCOUNTER — Emergency Department (HOSPITAL_COMMUNITY)
Admission: EM | Admit: 2018-03-16 | Discharge: 2018-03-17 | Disposition: A | Discharge status: Home / Self Care | Payer: Self-pay | Attending: Emergency Medicine | Admitting: Emergency Medicine

## 2018-03-16 ENCOUNTER — Other Ambulatory Visit: Payer: Self-pay

## 2018-03-16 ENCOUNTER — Emergency Department (HOSPITAL_COMMUNITY): Payer: Self-pay

## 2018-03-16 DIAGNOSIS — R000 Tachycardia, unspecified: Secondary | ICD-10-CM | POA: Insufficient documentation

## 2018-03-16 DIAGNOSIS — R0789 Other chest pain: Secondary | ICD-10-CM | POA: Insufficient documentation

## 2018-03-16 DIAGNOSIS — Z79899 Other long term (current) drug therapy: Secondary | ICD-10-CM | POA: Insufficient documentation

## 2018-03-16 DIAGNOSIS — J45909 Unspecified asthma, uncomplicated: Secondary | ICD-10-CM | POA: Insufficient documentation

## 2018-03-16 DIAGNOSIS — I10 Essential (primary) hypertension: Secondary | ICD-10-CM | POA: Insufficient documentation

## 2018-03-16 LAB — BASIC METABOLIC PANEL
Anion gap: 9 (ref 5–15)
BUN: 8 mg/dL (ref 6–20)
CALCIUM: 9.1 mg/dL (ref 8.9–10.3)
CO2: 22 mmol/L (ref 22–32)
Chloride: 106 mmol/L (ref 98–111)
Creatinine, Ser: 0.48 mg/dL (ref 0.44–1.00)
GFR calc Af Amer: >60 mL/min (ref >60)
GFR calc non Af Amer: >60 mL/min (ref >60)
Glucose, Bld: 110 mg/dL — ABNORMAL HIGH (ref 70–99)
Potassium: 3.4 mmol/L — ABNORMAL LOW (ref 3.5–5.1)
Sodium: 137 mmol/L (ref 135–145)

## 2018-03-16 LAB — I-STAT TROPONIN, ED: Troponin i, poc: 0 ng/mL (ref 0.00–0.08)

## 2018-03-16 LAB — CBC
HCT: 38.1 % (ref 36.0–46.0)
Hemoglobin: 11.7 g/dL — ABNORMAL LOW (ref 12.0–15.0)
MCH: 25.9 pg — ABNORMAL LOW (ref 26.0–34.0)
MCHC: 30.7 g/dL (ref 30.0–36.0)
MCV: 84.5 fL (ref 80.0–100.0)
Platelets: 394 10*3/uL (ref 150–400)
RBC: 4.51 MIL/uL (ref 3.87–5.11)
RDW: 14 % (ref 11.5–15.5)
WBC: 16.6 10*3/uL — ABNORMAL HIGH (ref 4.0–10.5)
nRBC: 0 % (ref 0.0–0.2)

## 2018-03-16 LAB — I-STAT BETA HCG BLOOD, ED (MC, WL, AP ONLY)

## 2018-03-16 NOTE — ED Triage Notes (Signed)
Pt arrives POV for eval of L sided chest pain w/ radiation to L arm and around to L back. Pt reports onset at work 5 hours PTA. Tried pepto at home w/ no relief. Pt reports feeling anxious. Denies numbness/tingling. Endorses mild SOB.

## 2018-03-16 NOTE — ED Notes (Signed)
Pt reports pain in chest worsens with movement, reports hx of anxiety states that she was taking prozac and ran out for 2 weeks. States that she began taking them again 2 days ago.

## 2018-03-17 ENCOUNTER — Emergency Department (HOSPITAL_COMMUNITY): Payer: Self-pay

## 2018-03-17 LAB — T4, FREE: Free T4: 0.77 ng/dL — ABNORMAL LOW (ref 0.82–1.77)

## 2018-03-17 LAB — TSH: TSH: 16.01 u[IU]/mL — ABNORMAL HIGH (ref 0.350–4.500)

## 2018-03-17 LAB — D-DIMER, QUANTITATIVE: D-Dimer, Quant: <0.27 ug/mL-FEU (ref 0.00–0.50)

## 2018-03-17 MED ORDER — LORAZEPAM 1 MG PO TABS
1.0000 mg | ORAL_TABLET | Freq: Once | ORAL | Status: AC
  Administered 2018-03-17: 1 mg via ORAL
  Filled 2018-03-17: qty 1

## 2018-03-17 MED ORDER — HYDROXYZINE HCL 25 MG PO TABS: 25.0000 mg | ORAL_TABLET | Freq: Four times a day (QID) | ORAL | 0 refills | Status: DC

## 2018-03-17 MED ORDER — IOPAMIDOL (ISOVUE-370) INJECTION 76%
80.0000 mL | Freq: Once | INTRAVENOUS | Status: AC | PRN
  Administered 2018-03-17: 80 mL via INTRAVENOUS

## 2018-03-17 MED ORDER — SODIUM CHLORIDE 0.9 % IV BOLUS
1000.0000 mL | Freq: Once | INTRAVENOUS | Status: AC
  Administered 2018-03-17: 1000 mL via INTRAVENOUS

## 2018-03-17 MED ORDER — IOPAMIDOL (ISOVUE-370) INJECTION 76%
INTRAVENOUS | Status: AC
  Filled 2018-03-17: qty 100

## 2018-03-17 MED ORDER — LORAZEPAM 2 MG/ML IJ SOLN
1.0000 mg | Freq: Once | INTRAMUSCULAR | Status: AC
  Administered 2018-03-17: 1 mg via INTRAVENOUS
  Filled 2018-03-17: qty 1

## 2018-03-17 MED ORDER — FAMOTIDINE IN NACL 20-0.9 MG/50ML-% IV SOLN
20.0000 mg | Freq: Once | INTRAVENOUS | Status: AC
  Administered 2018-03-17: 20 mg via INTRAVENOUS
  Filled 2018-03-17: qty 50

## 2018-03-17 MED FILL — hydrOXYzine HCL 25 MG TABS: 25 | 3 days supply | Qty: 12 | Fill #0

## 2018-03-17 NOTE — ED Notes (Signed)
Patient transported to CT 

## 2018-03-17 NOTE — ED Provider Notes (Signed)
[facility] EMERGENCY DEPARTMENT Provider Note   CSN: [id number] Arrival date & time: 03/16/18  2009    History   Chief Complaint Chief Complaint  Patient presents with  . Chest Pain    HPI Kimberly [name] is a 28 y.o. 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  The patient endorses increased flatus and gas earlier today while she was at work.  She reports she then developed constant pain under her left breast that radiated around to her left back.  She states the pain feels as if " you have a bone, like in your finger, that you want to pop, but it won't."  History of similar.  No recent falls or injuries.  She reports that her pain significantly worsens when she leans forward or to her left and improves when she leans onto her right side.  No other known aggravating or alleviating factors.  She also reports that she has been having more frequent headaches over the last month and a half on the left side of her head, but reports that they have been improving with ibuprofen.  She also reports that 3 days ago that she was seen for fever, chills, nausea, and 3 episodes of nonbloody, nonbilious emesis.  She was diagnosed with a viral illness that she reports symptoms have since resolved.  She reports that she has not been eating or drinking as well over the last couple days secondary to recent viral illness.  She denies palpitations, sinus, lightheadedness, leg swelling, back pain, shortness of breath, cough, new pain, mood dysuria, hematuria, visual pain, bleeding, or itching.  She reports that she takes omeprazole for GERD and Prozac for depression, and has been out of the medications for the last 2 weeks but did resume them in the last 2 days.  Also reports that she has been out of her home  levothyroxine for the last 2 weeks.     The history is provided by the patient. No language interpreter was used.    Past Medical History:  Diagnosis Date  . Anxiety   . Asthma    "grew out of it" (11/16/2012)  . Chronic back pain   . Complication of anesthesia    "takes a lot to put me to sleep; woke up completely mid endoscopy" (11/16/2012)  . Depression   . Dysrhythmia    ST (11/16/2012)  . Gastric reflux   . 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 Iron deficiency anemia   . Migraines    "weekly" (11/16/2012)  . Ovarian cyst   . Pregnancy induced hypertension   . Schizophrenia ([place])    "moderate" (11/16/2012)  . Scoliosis     Patient Active Problem List   Diagnosis Date Noted  . Degenerative disc disease at L5-S1 level 06/23/2016  . Chronic back pain 05/20/2016  . Anemia 01/16/2016  . Ovarian cyst 04/25/2015  . Rubella non-immune status, antepartum 03/11/2015  . 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 Goiter 03/01/2011  . Acanthosis nigricans, acquired 03/01/2011  . Oligomenorrhea 03/01/2011  . Hirsutism 03/01/2011  . PCOS (polycystic ovarian syndrome) 03/01/2011  . Hypertension 03/01/2011  . GERD (gastroesophageal reflux disease) 03/01/2011  . Anxiety and depression 03/01/2011  . Obesity 07/03/2010    Past Surgical History:  Procedure Laterality Date  . LAPAROSCOPIC OVARIAN CYSTECTOMY  Left 02/11/2016   Procedure: LAPAROSCOPIC OVARIAN CYSTECTOMY;  Surgeon: Emily Filbert, MD;  Location: [facility];  Service: Gynecology;  Laterality: Left;  . UPPER GI ENDOSCOPY  ~ 2006; ~2008     OB History    Gravida  2   Para  1   Term  1   Preterm      AB  1   Living  1     SAB  1   TAB      Ectopic      Multiple  0   Live Births  1            Home Medications    Prior to Admission medications   Medication Sig Start Date End Date Taking? Authorizing Provider  acetaminophen (TYLENOL) 500 MG tablet Take 1,000 mg  by mouth every 8 (eight) hours as needed (for pain or headaches).   Yes [provider]  FLUoxetine (PROZAC) 20 MG tablet Take 1 tablet (20 mg total) by mouth daily. 03/14/18  Yes Fulp, Cammie, MD  levothyroxine (SYNTHROID, LEVOTHROID) 200 MCG tablet Take 1 tablet (200 mcg total) by mouth daily before breakfast. 03/14/18  Yes Fulp, Cammie, MD  naproxen sodium (ALEVE) 220 MG tablet Take 220-440 mg by mouth 2 (two) times daily as needed (for pain or headaches).   Yes [provider]  omeprazole (PRILOSEC) 20 MG capsule Take 1 capsule (20 mg total) by mouth daily. 03/14/18  Yes Fulp, Cammie, MD  hydrOXYzine (ATARAX/VISTARIL) 25 MG tablet Take 1 tablet (25 mg total) by mouth every 6 (six) hours. 03/17/18   Joycelin Radloff A, PA-C  ondansetron (ZOFRAN) 4 MG tablet Take 1 tablet (4 mg total) by mouth every 8 (eight) hours as needed for nausea or vomiting. 03/14/18   Fulp, Cammie, MD  promethazine (PHENERGAN) 25 MG tablet Take 1 tablet (25 mg total) by mouth every 6 (six) hours as needed for nausea or vomiting. 02/03/17   Fredia Sorrow, MD  traMADol (ULTRAM) 50 MG tablet Take 1 tablet (50 mg total) by mouth every 12 (twelve) hours as needed. Patient not taking: Reported on 03/17/2018 12/15/16   Charlott Rakes, MD  bethanechol (URECHOLINE) 5 MG tablet Take 5 mg by mouth 3 (three) times daily.   02/05/12  [provider]    Family History Family History  Problem Relation Age of Onset  . Diabetes Mother   . Hypertension Mother   . Vision loss Mother   . Hypertension Father   . Hyperlipidemia Father   . Thyroid disease Maternal Grandmother     Social History Social History   Tobacco Use  . Smoking status: Never Smoker  . Smokeless tobacco: Never Used  Substance Use Topics  . Alcohol use: Yes    Comment: very rare  . Drug use: No     Allergies   Cold & cough daytime [acetaminophen-dm]; Sulfa antibiotics; Abilify [aripiprazole]; Amoxicillin-pot clavulanate; Cephalexin;  Ferrous sulfate; Latex; Tape; Zicam cold remedy [erysidoron #1]; Keflex [cephalexin]; and Vicodin [hydrocodone-acetaminophen]   Review of Systems Review of Systems  Constitutional: Positive for chills (resolved) and fever (resolved). Negative for activity change.  HENT: Negative for congestion.   Respiratory: Negative for cough, shortness of breath and wheezing.   Cardiovascular: Positive for chest pain. Negative for palpitations and leg swelling.  Gastrointestinal: Negative for abdominal pain, constipation, diarrhea, nausea and vomiting.  Genitourinary: Negative for dysuria.  Musculoskeletal: Negative for back pain.  Skin: Negative for rash.  Allergic/Immunologic: Negative for immunocompromised state.  Neurological: Negative for weakness and headaches.  Psychiatric/Behavioral: Negative for confusion.     Physical Exam Updated Vital Signs BP 133/90   Pulse (!) 119   Temp 98.4 F (36.9 C) (Oral)   Resp (!) 21   Ht 5\' 4"  (1.626 m)   Wt 124.3 kg   LMP 03/09/2018   SpO2 95%   BMI 47.03 kg/m   Physical Exam Vitals signs and nursing note reviewed.  Constitutional:      General: She is not in acute distress.    Comments: Obese female.  Well-appearing.  No acute distress.  HENT:     Head: Normocephalic.  Eyes:     Extraocular Movements: Extraocular movements intact.     Conjunctiva/sclera: Conjunctivae normal.     Pupils: Pupils are equal, round, and reactive to light.  Neck:     Musculoskeletal: Normal range of motion and neck supple.  Cardiovascular:     Rate and Rhythm: Regular rhythm. Tachycardia present.     Pulses:          Radial pulses are 2+ on the right side and 2+ on the left side.       Dorsalis pedis pulses are 2+ on the right side and 2+ on the left side.     Heart sounds: No murmur. No friction rub. No gallop.      Comments: Pain is not reproducible with palpation in the mid and left chest.  No tenderness along the left ribs.  No rash. Pulmonary:      Effort: Pulmonary effort is normal. No tachypnea, accessory muscle usage or respiratory distress.     Breath sounds: No stridor. No decreased breath sounds, wheezing, rhonchi or rales.  Abdominal:     General: There is no distension.     Palpations: Abdomen is soft.  Musculoskeletal:     Right lower leg: She exhibits no tenderness. No edema.     Left lower leg: She exhibits no tenderness. No edema.  Skin:    General: Skin is warm.     Findings: No rash.  Neurological:     Mental Status: She is alert.  Psychiatric:        Behavior: Behavior normal.      ED Treatments / Results  Labs (all labs ordered are listed, but only abnormal results are displayed) Labs Reviewed  BASIC METABOLIC PANEL - Abnormal; Notable for the following components:      Result Value   Potassium 3.4 (*)    Glucose, Bld 110 (*)    All other components within normal limits  CBC - Abnormal; Notable for the following components:   WBC 16.6 (*)    Hemoglobin 11.7 (*)    MCH 25.9 (*)    All other components within normal limits  TSH - Abnormal; Notable for the following components:   TSH 16.010 (*)    All other components within normal limits  T4, FREE - Abnormal; Notable for the following components:   Free T4 0.77 (*)    All other components within normal limits  D-DIMER, QUANTITATIVE (NOT AT [facility])  T3, FREE  I-STAT TROPONIN, ED  I-STAT BETA HCG BLOOD, ED ([facility], [facility], [facility] ONLY)    EKG EKG Interpretation  Date/Time:  Thursday March 17 2018 05:56:27 EST Ventricular Rate:  116 PR Interval:  136 QRS Duration: 99 QT Interval:  336 QTC Calculation: 467 R Axis:   49 Text Interpretation:  Sinus tachycardia No significant change was found Confirmed by Ezequiel Essex [phone])  on 03/17/2018 5:59:16 AM Also confirmed by Ezequiel Essex [phone]), editor Ehrenfeld, Jeannetta Nap [phone])  on 03/17/2018 9:10:23 AM   Radiology Dg Chest 2 View  Result Date: 03/16/2018 CLINICAL DATA:  Chest pain EXAM: CHEST - 2 VIEW  COMPARISON:  08/07/2014 FINDINGS: Heart and mediastinal contours are within normal limits. No focal opacities or effusions. No acute bony abnormality. IMPRESSION: No active cardiopulmonary disease. Electronically Signed   By: Rolm Baptise M.D.   On: 03/16/2018 21:30   Ct Angio Chest Pe W And/or Wo Contrast  Result Date: 03/17/2018 CLINICAL DATA:  28 year old female with tachycardia. EXAM: CT ANGIOGRAPHY CHEST WITH CONTRAST TECHNIQUE: Multidetector CT imaging of the chest was performed using the standard protocol during bolus administration of intravenous contrast. Multiplanar CT image reconstructions and MIPs were obtained to evaluate the vascular anatomy. CONTRAST:  77mL ISOVUE-370 IOPAMIDOL (ISOVUE-370) INJECTION 76% COMPARISON:  Chest radiograph dated 03/16/2018. FINDINGS: Cardiovascular: There is no cardiomegaly or pericardial effusion. The thoracic aorta is unremarkable. No CT evidence of pulmonary embolism. Mediastinum/Nodes: There is no hilar or mediastinal adenopathy. Esophagus and the thyroid gland are grossly unremarkable. No mediastinal fluid collection. Lungs/Pleura: The lungs are clear. There is no pleural effusion or pneumothorax. The central airways are patent. Upper Abdomen: Diffuse fatty infiltration of the liver. The visualized upper abdomen is otherwise unremarkable. Musculoskeletal: No chest wall abnormality. No acute or significant osseous findings. Review of the MIP images confirms the above findings. IMPRESSION: No acute intrathoracic pathology. No CT evidence of pulmonary embolism. Electronically Signed   By: Anner Crete M.D.   On: 03/17/2018 05:47    Procedures Procedures (including critical care time)  Medications Ordered in ED Medications  sodium chloride 0.9 % bolus 1,000 mL (0 mLs Intravenous Stopped 03/17/18 0358)  LORazepam (ATIVAN) tablet 1 mg (1 mg Oral Given 03/17/18 0058)  famotidine (PEPCID) IVPB 20 mg premix (0 mg Intravenous Stopped 03/17/18 0215)  sodium  chloride 0.9 % bolus 1,000 mL (0 mLs Intravenous Stopped 03/17/18 0413)  LORazepam (ATIVAN) injection 1 mg (1 mg Intravenous Given 03/17/18 0318)  iopamidol (ISOVUE-370) 76 % injection 80 mL (80 mLs Intravenous Contrast Given 03/17/18 0512)     Initial Impression / Assessment and Plan / ED Course  I have reviewed the triage vital signs and the nursing notes.  Pertinent labs & imaging results that were available during my care of the patient were reviewed by me and considered in my medical decision making (see chart for details).        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  EKG was sinus tachycardia.  Patient's heart rate has been in the 110s on arrival, but has gradually increased from the 120s to the 150s.  Heart rate seems to increase with standing and with leaning forward.  She continues to deny no shortness of breath.  Labs are notable for a leukocytosis of 16.6.  Possibly reactive?  She did have infectious symptoms earlier this week, but those have since resolved.  Hemoglobin is stable at 11.7.  He is H is 16, but is likely not contributing to her symptoms.  Troponin is normal.  D-dimer is negative.  She was given a 1 L fluid bolus and a dose of Ativan and on reevaluation there was no improvement in her heart rate.  The patient was discussed with Dr. Wyvonnia Dusky, attending physician.  Who recommended  given the patient additional fluid bolus and another dose of Ativan.  No improvement in heart rate following these measures.  Although d-dimer was negative, there was concerned that she may have a PE as it appears there may be a family history of PEs.  CT study was obtained and was negative.  At this time, the patient's heart rate is now in the 110s.  She reports that her chest pain has improved somewhat.   At this time, I feel that all life-threatening etiologies of unexplained tachycardia have been assessed in the ER.  I will provide her with a referral to cardiology for outpatient follow-up.  She does also have a history of significant anxiety; however, heart rate did not improve after treatment of anxiety.  At this time, I am uncertain of the etiology of her symptoms.  We will provide [facility] referral for anxiety and advised her to follow-up with her PCP in addition to cardiology.  She is hemodynamically stable and in no acute distress.  Safe for discharge home with outpatient follow-up at this time.  Final Clinical Impressions(s) / ED Diagnoses   Final diagnoses:  Tachycardia, unspecified  Atypical chest pain    ED Discharge Orders         Ordered    hydrOXYzine (ATARAX/VISTARIL) 25 MG tablet  Every 6 hours     03/17/18 0624           Joanne Gavel, PA-C 03/17/18 1025    Ezequiel Essex, MD 03/19/18 [phone]

## 2018-03-17 NOTE — Discharge Instructions (Signed)
Thank you for allowing me to care for you today in the Emergency Department.   Follow up with cardiology.  You can use [facility] at [facility] as your primary care provider.   Follow-up with [facility] for anxiety.  Take 1 tablet of hydroxyzine every 6 hours as needed for anxiety.  Use caution until you know how this medication can impact you because sometimes it can make you drowsy if after work or drive.  Take 650 mg of Tylenol or 600 mg of ibuprofen with food every 6 hours as needed for pain control.  Return to the emergency department if you develop chest pain with a high fever, severe shortness of breath or respiratory distress, if you pass out, develop severe dizziness, sweating, or other new, concerning symptoms.

## 2018-03-17 NOTE — ED Notes (Signed)
ED Provider at bedside. 

## 2018-03-18 LAB — T3, FREE: T3, Free: 3.6 pg/mL (ref 2.0–4.4)

## 2018-03-25 ENCOUNTER — Telehealth: Payer: Self-pay | Admitting: Licensed Clinical Social Worker

## 2018-03-25 NOTE — Telephone Encounter (Signed)
LCSWA attempted to contact pt to follow up on behavioral health referral from PCP. LCSWA left message for a return call.  

## 2018-03-27 DIAGNOSIS — R079 Chest pain, unspecified: Secondary | ICD-10-CM | POA: Insufficient documentation

## 2018-03-27 NOTE — Progress Notes (Signed)
Cardiology Office Note    Date:  03/28/2018   ID:  MEGGEN SPAZIANI, DOB 1990-12-18, MRN [id number]  PCP:  Charlott Rakes, MD  Cardiologist:  Fransico Him, MD   Chief Complaint  Patient presents with  . New Patient (Initial Visit)    Chest pain    History of Present Illness:  Kimberly [name] is a 28 y.o. female who is being seen today for the evaluation of chest pain at the request of Charlott Rakes, MD.  A 28 year old female with a history of anxiety, asthma and depression, GERD, hypothyroidism and pregnancy-induced hypertension.  She also has moderate schizophrenia diagnosed in 2014.  She was recently seen in the emergency room on 03/17/2018 with chest pain.  She said she was having problems at work with increased gas and then suddenly developed chest pain under her left breast that radiated around to her left back.  Michela Pitcher it felt like something wanted to pop in her chest but would not pop. The pain was worse when she leaned forward or to her left side and improved when she rolled over to her right side.  She had also reported a 3-day history of fever, chills, nausea and nonbloody emesis.  She was  diagnosed with a viral illness 3 days prior but symptoms had resolved. She had run out of her omeprazole for her GERD and Prozac for her depression 2 weeks prior.  In ER she was noted to have an elevated white blood cell count 16.6 and was felt to be likely reactive to recent URI.  Trop and d-dimer were normal.  She was given a liter of fluid and a dose of Ativan with improvement in her symptoms.  She was tachycardic on exam and felt to possibly be dehydrated.  Given a family history of PEs and ongoing tachycardia, chest CT was done which was negative for PE.  After hydration with several liters of fluid her chest pain improved and her heart rate normalized.  She is now referred to cardiology for further evaluation.  Also referred to therapy for her anxiety.  Note her TSH came back at 67 with a free T4  low at 0.77.  She is here today for evaluation.  She denies any chest pain or pressure, SOB, DOE, PND, orthopnea, LE edema, dizziness, palpitations or syncope. She is compliant with her meds and is tolerating meds with no SE.    Past Medical History:  Diagnosis Date  . Anxiety   . Asthma    "grew out of it" (11/16/2012)  . Chronic back pain   . Complication of anesthesia    "takes a lot to put me to sleep; woke up completely mid endoscopy" (11/16/2012)  . Depression   . Dysrhythmia    ST (11/16/2012)  . Gastric reflux   . 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 Iron deficiency anemia   . Migraines    "weekly" (11/16/2012)  . Ovarian cyst   . Pregnancy induced hypertension   . Schizophrenia ([place])    "moderate" (11/16/2012)  . Scoliosis     Past Surgical History:  Procedure Laterality Date  . LAPAROSCOPIC OVARIAN CYSTECTOMY Left 02/11/2016   Procedure: LAPAROSCOPIC OVARIAN CYSTECTOMY;  Surgeon: Emily Filbert, MD;  Location: [facility];  Service: Gynecology;  Laterality: Left;  . UPPER GI ENDOSCOPY  ~ 2006; ~2008    Current Medications: Current Meds  Medication Sig  . acetaminophen (TYLENOL) 500 MG tablet Take 1,000 mg by mouth every 8 (  eight) hours as needed (for pain or headaches).  Marland Kitchen FLUoxetine (PROZAC) 20 MG tablet Take 1 tablet (20 mg total) by mouth daily.  . hydrOXYzine (ATARAX/VISTARIL) 25 MG tablet Take 1 tablet (25 mg total) by mouth every 6 (six) hours.  Marland Kitchen levothyroxine (SYNTHROID, LEVOTHROID) 200 MCG tablet Take 1 tablet (200 mcg total) by mouth daily before breakfast.  . naproxen sodium (ALEVE) 220 MG tablet Take 220-440 mg by mouth 2 (two) times daily as needed (for pain or headaches).  Marland Kitchen omeprazole (PRILOSEC) 20 MG capsule Take 1 capsule (20 mg total) by mouth daily.  . ondansetron (ZOFRAN) 4 MG tablet Take 1 tablet (4 mg total) by mouth every 8 (eight) hours as needed for nausea or vomiting.  . traMADol (ULTRAM) 50 MG tablet Take 1 tablet (50 mg  total) by mouth every 12 (twelve) hours as needed.  . [DISCONTINUED] promethazine (PHENERGAN) 25 MG tablet Take 1 tablet (25 mg total) by mouth every 6 (six) hours as needed for nausea or vomiting.    Allergies:   Cold & cough daytime [acetaminophen-dm]; Sulfa antibiotics; Abilify [aripiprazole]; Amoxicillin-pot clavulanate; Cephalexin; Ferrous sulfate; Latex; Tape; Zicam cold remedy [erysidoron #1]; Keflex [cephalexin]; and Vicodin [hydrocodone-acetaminophen]   Social History   Socioeconomic History  . Marital status: Single    Spouse name: Not on file  . Number of children: Not on file  . Years of education: Not on file  . Highest education level: Not on file  Occupational History  . Not on file  Social Needs  . Financial resource strain: Not on file  . Food insecurity:    Worry: Not on file    Inability: Not on file  . Transportation needs:    Medical: Not on file    Non-medical: Not on file  Tobacco Use  . Smoking status: Never Smoker  . Smokeless tobacco: Never Used  Substance and Sexual Activity  . Alcohol use: Yes    Comment: very rare  . Drug use: No  . Sexual activity: Yes    Birth control/protection: Pill  Lifestyle  . Physical activity:    Days per week: Not on file    Minutes per session: Not on file  . Stress: Not on file  Relationships  . Social connections:    Talks on phone: Not on file    Gets together: Not on file    Attends religious service: Not on file    Active member of club or organization: Not on file    Attends meetings of clubs or organizations: Not on file    Relationship status: Not on file  Other Topics Concern  . Not on file  Social History Narrative  . Not on file     Family History:  The patient's family history includes Diabetes in her mother; Hyperlipidemia in her father; Hypertension in her father and mother; Thyroid disease in her maternal grandmother; Vision loss in her mother.   ROS:   Please see the history of present  illness.    ROS All other systems reviewed and are negative.  No flowsheet data found.     PHYSICAL EXAM:   VS:  BP 110/68   Pulse 92   Ht 5\' 4"  (1.626 m)   Wt 275 lb (124.7 kg)   LMP 03/09/2018   SpO2 98%   BMI 47.20 kg/m    GEN: Well nourished, well developed, in no acute distress  HEENT: normal  Neck: no JVD, carotid bruits, or masses Cardiac: RRR; no  murmurs, rubs, or gallops,no edema.  Intact distal pulses bilaterally.  Respiratory:  clear to auscultation bilaterally, normal work of breathing GI: soft, nontender, nondistended, + BS MS: no deformity or atrophy  Skin: warm and dry, no rash Neuro:  Alert and Oriented x 3, Strength and sensation are intact Psych: euthymic mood, full affect  Wt Readings from Last 3 Encounters:  03/28/18 275 lb (124.7 kg)  03/16/18 274 lb (124.3 kg)  03/14/18 277 lb (125.6 kg)      Studies/Labs Reviewed:   EKG:  EKG is ordered today.    Recent Labs: 04/16/2017: ALT 19 08/30/2017: BNP 10.0 03/16/2018: BUN 8; Creatinine, Ser 0.48; Hemoglobin 11.7; Platelets 394; Potassium 3.4; Sodium 137 03/17/2018: TSH 16.010   Lipid Panel    Component Value Date/Time   CHOL 178 06/08/2016 0936   TRIG 237 (H) 06/08/2016 0936   HDL 41 06/08/2016 0936   CHOLHDL 4.3 06/08/2016 0936   CHOLHDL 4.4 08/07/2013 1141   VLDL 29 08/07/2013 1141   LDLCALC 90 06/08/2016 0936    Additional studies/ records that were reviewed today include:  Hospital notes from ER  ASSESSMENT:    1. Chest pain, unspecified type   2. Essential hypertension   3. Tachycardia      PLAN:  In order of problems listed above:  1.  Chest pain - this was atypical and in the emergency room when evaluated was felt to likely be related to underlying anxiety and possibly recent URI but actually sounds more GI related to gas to me.  She has not had any further chest pain episodes.  She has no cardiac risk factors.  She does not smoke and has no family history of CAD.  EKG in ER was  nonischemic.  At this point no further cardiac work-up  indicated unless she has recurrent chest pain.  2.  Hypertension - BP is controlled on exam today.  Not required any antihypertensive therapy.  3.  Tachycardia -heart rate was elevated in the hospital but felt to likely be due to recent viral illness with dehydration. Improved with IV fluid hydration and her heart rate is normal today.  Of note TSH was elevated and T4 low.  She has known thyroid issues which are followed by her PCP.  Heart rate is 92 bpm today.  I am going to get a 24-hour Holter monitor to assess average heart rate.  I have encouraged her to try to cut back on caffeine intake.  She should also avoid over-the-counter decongestants.   Medication Adjustments/Labs and Tests Ordered: Current medicines are reviewed at length with the patient today.  Concerns regarding medicines are outlined above.  Medication changes, Labs and Tests ordered today are listed in the Patient Instructions below.  There are no Patient Instructions on file for this visit.   Signed, Fransico Him, MD  03/28/2018 2:11 PM    [facility] [address], [place], [place]  [address] Phone: [phone]; Fax: [phone]

## 2018-03-28 ENCOUNTER — Encounter: Payer: Self-pay | Admitting: Cardiology

## 2018-03-28 ENCOUNTER — Ambulatory Visit (INDEPENDENT_AMBULATORY_CARE_PROVIDER_SITE_OTHER): Payer: Self-pay | Admitting: Cardiology

## 2018-03-28 VITALS — BP 110/68 | HR 92 | Ht 64.0 in | Wt 275.0 lb

## 2018-03-28 DIAGNOSIS — R000 Tachycardia, unspecified: Secondary | ICD-10-CM

## 2018-03-28 DIAGNOSIS — R079 Chest pain, unspecified: Secondary | ICD-10-CM

## 2018-03-28 DIAGNOSIS — I10 Essential (primary) hypertension: Secondary | ICD-10-CM

## 2018-03-28 NOTE — Patient Instructions (Signed)
Medication Instructions:  Your physician recommends that you continue on your current medications as directed. Please refer to the Current Medication list given to you today.  If you need a refill on your cardiac medications before your next appointment, please call your pharmacy.   Lab work: None If you have labs (blood work) drawn today and your tests are completely normal, you will receive your results only by: Marland Kitchen MyChart Message (if you have MyChart) OR . A paper copy in the mail If you have any lab test that is abnormal or we need to change your treatment, we will call you to review the results.  Testing/Procedures: Your physician has recommended that you wear a 24 hour holter monitor. Holter monitors are medical devices that record the heart's electrical activity. Doctors most often use these monitors to diagnose arrhythmias. Arrhythmias are problems with the speed or rhythm of the heartbeat. The monitor is a small, portable device. You can wear one while you do your normal daily activities. This is usually used to diagnose what is causing palpitations/syncope (passing out).  Follow-Up: As needed following results.

## 2018-04-06 ENCOUNTER — Telehealth: Payer: Self-pay | Admitting: Internal Medicine

## 2018-04-06 NOTE — Telephone Encounter (Signed)
Left VM I have reviewed records Would recomm rescheduling given corona virus concerns to limit exposure Will reschedule for late April

## 2018-04-25 MED FILL — LEVOTHYROXINE 200 MCG TAB: 200 | 30 days supply | Qty: 30 | Fill #1

## 2018-04-25 MED FILL — FLUoxetine HCL 20 MG TABS: 20 | 30 days supply | Qty: 30 | Fill #1

## 2018-04-25 MED FILL — OMEPRAZOLE 20 MG CAP: 20 | 30 days supply | Qty: 30 | Fill #1

## 2018-05-10 ENCOUNTER — Telehealth: Payer: Self-pay | Admitting: Radiology

## 2018-05-10 NOTE — Telephone Encounter (Signed)
Enrolled patient for a 3 day Zio Long Term Monitor to be mailed due to Covid-19. Patients understand instructions and knows to expect monitor in 3-4 days.  *24hr holter was switched to a 3 day Zio so monitor could be mailed.

## 2018-05-16 ENCOUNTER — Ambulatory Visit (INDEPENDENT_AMBULATORY_CARE_PROVIDER_SITE_OTHER): Payer: Self-pay

## 2018-05-16 DIAGNOSIS — R000 Tachycardia, unspecified: Secondary | ICD-10-CM

## 2018-06-02 ENCOUNTER — Other Ambulatory Visit: Payer: Self-pay

## 2018-06-15 MED FILL — LEVOTHYROXINE 200 MCG TAB: 200 | 30 days supply | Qty: 30 | Fill #3

## 2018-06-15 MED FILL — OMEPRAZOLE 20 MG CAP: 20 | 30 days supply | Qty: 30 | Fill #3

## 2018-06-15 MED FILL — FLUoxetine HCL 20 MG TABS: 20 | 30 days supply | Qty: 30 | Fill #3

## 2018-07-19 ENCOUNTER — Telehealth: Payer: Self-pay | Admitting: Family Medicine

## 2018-07-19 DIAGNOSIS — K219 Gastro-esophageal reflux disease without esophagitis: Secondary | ICD-10-CM

## 2018-07-19 DIAGNOSIS — F329 Major depressive disorder, single episode, unspecified: Secondary | ICD-10-CM

## 2018-07-19 DIAGNOSIS — F419 Anxiety disorder, unspecified: Secondary | ICD-10-CM

## 2018-07-19 DIAGNOSIS — E039 Hypothyroidism, unspecified: Secondary | ICD-10-CM

## 2018-07-19 MED ORDER — OMEPRAZOLE 20 MG PO CPDR: 20.0000 mg | DELAYED_RELEASE_CAPSULE | Freq: Every day | ORAL | 0 refills | Status: DC

## 2018-07-19 MED ORDER — FLUOXETINE HCL 20 MG PO TABS: 20.0000 mg | ORAL_TABLET | Freq: Every day | ORAL | 0 refills | Status: DC

## 2018-07-19 MED ORDER — LEVOTHYROXINE SODIUM 200 MCG PO TABS: 200.0000 ug | ORAL_TABLET | Freq: Every day | ORAL | 0 refills | Status: DC

## 2018-07-19 NOTE — Telephone Encounter (Signed)
1) Medication(s) Requested (by name): omeprazole (PRILOSEC) 20 MG capsule [[id number]]  levothyroxine (SYNTHROID, LEVOTHROID) 200 MCG tablet [[id number]]  FLUoxetine (PROZAC) 20 MG tablet [[id number]]     2) Pharmacy of Choice: [facility]   3) Special Requests:   Approved medications will be sent to the pharmacy, we will reach out if there is an issue.  Requests made after 3pm may not be addressed until the following business day!  If a patient is unsure of the name of the medication(s) please note and ask patient to call back when they are able to provide all info, do not send to responsible party until all information is available!

## 2018-07-20 MED FILL — OMEPRAZOLE 20 MG CAP: 20 | 30 days supply | Qty: 30 | Fill #0

## 2018-07-20 MED FILL — LEVOTHYROXINE 200 MCG TAB: 200 | 30 days supply | Qty: 30 | Fill #0

## 2018-07-20 MED FILL — FLUoxetine HCL 20 MG TABS: 20 | 30 days supply | Qty: 30 | Fill #0

## 2018-07-25 ENCOUNTER — Ambulatory Visit: Payer: Self-pay | Attending: Family Medicine | Admitting: Family Medicine

## 2018-07-25 ENCOUNTER — Encounter: Payer: Self-pay | Admitting: Family Medicine

## 2018-07-25 ENCOUNTER — Other Ambulatory Visit: Payer: Self-pay

## 2018-07-25 DIAGNOSIS — E039 Hypothyroidism, unspecified: Secondary | ICD-10-CM

## 2018-07-25 DIAGNOSIS — M545 Low back pain, unspecified: Secondary | ICD-10-CM

## 2018-07-25 DIAGNOSIS — G8929 Other chronic pain: Secondary | ICD-10-CM

## 2018-07-25 DIAGNOSIS — F329 Major depressive disorder, single episode, unspecified: Secondary | ICD-10-CM

## 2018-07-25 DIAGNOSIS — F32A Depression, unspecified: Secondary | ICD-10-CM

## 2018-07-25 DIAGNOSIS — K219 Gastro-esophageal reflux disease without esophagitis: Secondary | ICD-10-CM

## 2018-07-25 DIAGNOSIS — F419 Anxiety disorder, unspecified: Secondary | ICD-10-CM

## 2018-07-25 MED ORDER — TRAMADOL HCL 50 MG PO TABS: 50.0000 mg | ORAL_TABLET | Freq: Two times a day (BID) | ORAL | 1 refills | Status: DC | PRN

## 2018-07-25 MED ORDER — OMEPRAZOLE 20 MG PO CPDR: 20.0000 mg | DELAYED_RELEASE_CAPSULE | Freq: Every day | ORAL | 6 refills | Status: DC

## 2018-07-25 MED ORDER — FLUOXETINE HCL 20 MG PO TABS: 20.0000 mg | ORAL_TABLET | Freq: Every day | ORAL | 6 refills | Status: DC

## 2018-07-25 MED ORDER — HYDROXYZINE HCL 25 MG PO TABS: 25.0000 mg | ORAL_TABLET | Freq: Three times a day (TID) | ORAL | 6 refills | Status: DC | PRN

## 2018-07-25 NOTE — Progress Notes (Signed)
Virtual Visit via Telephone Note  I connected with [name], on 07/25/2018 at 4:20 PM by telephone due to the COVID-19 pandemic and verified that I am speaking with the correct person using two identifiers.   Consent: I discussed the limitations, risks, security and privacy concerns of performing an evaluation and management service by telephone and the availability of in person appointments. I also discussed with the patient that there may be a patient responsible charge related to this service. The patient expressed understanding and agreed to proceed.   Location of Patient: Home  Location of Provider: Clinic   Persons participating in Telemedicine visit: Kimberly [name]-CMA Dr. Felecia Shelling     History of Present Illness: Kimberly [name] is a 28 year old female with a history of anxiety and depression, hypothyroidism, chronic low back pain who presents today for a follow-up visit. 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 She has been out of her PPI for GERD and has noticed worsening of GERD symptoms to the point where she is having to take a lot of Tums. Her anxiety is better on the combination of Prozac and hydroxyzine and she endorses compliance with levothyroxine which she states she takes on an empty stomach first thing in the morning.  Last TSH was elevated. She had a visit with cardiology in 03/2018 for chest pain thought to be atypical and possibly GI related. At this time she has no chest pains, dyspnea.   Past Medical History:  Diagnosis Date  . Anxiety   . Asthma    "grew out of it" (11/16/2012)  . Chronic back pain   . Complication of anesthesia    "takes a lot to put me to sleep; woke up completely mid endoscopy" (11/16/2012)  . Depression   . Dysrhythmia    ST (11/16/2012)  . Gastric reflux   . 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 Iron  deficiency anemia   . Migraines    "weekly" (11/16/2012)  . Ovarian cyst   . Pregnancy induced hypertension   . Schizophrenia ([place])    "moderate" (11/16/2012)  . Scoliosis    Allergies  Allergen Reactions  . Cold & Cough Daytime [Acetaminophen-Dm] Shortness Of Breath    Could not tolerate- orange liquid, may have contained Pseudoephedrine- "I could not tolerate"  . Sulfa Antibiotics Swelling  . Abilify [Aripiprazole] Other (See Comments)    Lethargy, unable to function  . Amoxicillin-Pot Clavulanate Other (See Comments)    Stomach pains Has patient had a PCN reaction causing immediate rash, facial/tongue/throat swelling, SOB or lightheadedness with hypotension: No Has patient had a PCN reaction causing severe rash involving mucus membranes or skin necrosis: No Has patient had a PCN reaction that required hospitalization No Has patient had a PCN reaction occurring within the last 10 years: Yes If all of the above answers are "NO", then may proceed with Cephalosporin use.   . Cephalexin Other (See Comments)    Stomach pains Pt taking Cefadroxil as outpatient  . Ferrous Sulfate Nausea And Vomiting  . Latex Swelling and Other (See Comments)    Burning of skin, too  . Tape Other (See Comments)    Swelling, burning of skin .   Marland Kitchen Zicam Cold Remedy [Tucks] Nausea And Vomiting  . Keflex [Cephalexin] Rash and Other (See Comments)    Rash--able to take w/ Benadryl  . Vicodin [Hydrocodone-Acetaminophen] Hives  and Rash    Per pt- makes her very sleepy, feels like she isn't getting her breath. States she does not have throat/tongue swelling or anaphylaxis.    Current Outpatient Medications on File Prior to Visit  Medication Sig Dispense Refill  . acetaminophen (TYLENOL) 500 MG tablet Take 1,000 mg by mouth every 8 (eight) hours as needed (for pain or headaches).    Marland Kitchen levothyroxine (SYNTHROID) 200 MCG tablet Take 1 tablet (200 mcg total) by mouth daily before breakfast. 30 tablet 0  .  naproxen sodium (ALEVE) 220 MG tablet Take 220-440 mg by mouth 2 (two) times daily as needed (for pain or headaches).    . ondansetron (ZOFRAN) 4 MG tablet Take 1 tablet (4 mg total) by mouth every 8 (eight) hours as needed for nausea or vomiting. 20 tablet 0   No current facility-administered medications on file prior to visit.     Observations/Objective: Weak, alert, oriented x3 Not in acute distress  Assessment and Plan: 1. Anxiety and depression Controlled - FLUoxetine (PROZAC) 20 MG tablet; Take 1 tablet (20 mg total) by mouth daily.  Dispense: 30 tablet; Refill: 6  2. Gastroesophageal reflux disease without esophagitis Uncontrolled due to running out of medications which have refilled - omeprazole (PRILOSEC) 20 MG capsule; Take 1 capsule (20 mg total) by mouth daily.  Dispense: 30 capsule; Refill: 6  3. Chronic bilateral low back pain without sciatica Controlled on tramadol She is not on a pain contract as she uses it sparingly - traMADol (ULTRAM) 50 MG tablet; Take 1 tablet (50 mg total) by mouth every 12 (twelve) hours as needed.  Dispense: 60 tablet; Refill: 1  4. Hypothyroidism, unspecified type Uncontrolled We will send of thyroid panel and adjust regimen accordingly Counseled on proper administration of levothyroxine - T4, free; Future - TSH; Future - Comprehensive metabolic panel; Future   Follow Up Instructions: Return in about 6 months (around 01/25/2019) for Medical conditions.    I discussed the assessment and treatment plan with the patient. The patient was provided an opportunity to ask questions and all were answered. The patient agreed with the plan and demonstrated an understanding of the instructions.   The patient was advised to call back or seek an in-person evaluation if the symptoms worsen or if the condition fails to improve as anticipated.     I provided 16 minutes total of non-face-to-face time during this encounter including median intraservice  time, reviewing previous notes, labs, imaging, medications, management and patient verbalized understanding.     Charlott Rakes, MD, FAAFP. [facility] [place], [place]   07/25/2018, 4:20 PM

## 2018-07-25 NOTE — Progress Notes (Signed)
Patient has been called and DOB has been verified. Patient has been screened and transferred to PCP to start phone visit.   Refills on hydroxyzine,and tramadol.

## 2018-07-26 ENCOUNTER — Other Ambulatory Visit: Payer: Self-pay

## 2018-07-26 ENCOUNTER — Ambulatory Visit: Payer: Medicaid Other | Attending: Family Medicine

## 2018-07-26 DIAGNOSIS — E039 Hypothyroidism, unspecified: Secondary | ICD-10-CM

## 2018-07-26 MED FILL — traMADol HCL 50 MG TABS: 50 | 30 days supply | Qty: 60 | Fill #0

## 2018-07-26 MED FILL — hydrOXYzine HCL 25 MG TABS: 25 | 20 days supply | Qty: 60 | Fill #0

## 2018-07-27 ENCOUNTER — Other Ambulatory Visit: Payer: Self-pay | Admitting: Family Medicine

## 2018-07-27 DIAGNOSIS — E039 Hypothyroidism, unspecified: Secondary | ICD-10-CM

## 2018-07-27 DIAGNOSIS — K219 Gastro-esophageal reflux disease without esophagitis: Secondary | ICD-10-CM

## 2018-07-27 LAB — T4, FREE: Free T4: 1.52 ng/dL (ref 0.82–1.77)

## 2018-07-27 LAB — COMPREHENSIVE METABOLIC PANEL
ALT: 20 IU/L (ref 0–32)
AST: 16 IU/L (ref 0–40)
Albumin/Globulin Ratio: 1.6 (ref 1.2–2.2)
Albumin: 4.1 g/dL (ref 3.9–5.0)
Alkaline Phosphatase: 91 IU/L (ref 39–117)
BUN/Creatinine Ratio: 14 (ref 9–23)
BUN: 7 mg/dL (ref 6–20)
Bilirubin Total: 0.4 mg/dL (ref 0.0–1.2)
CO2: 22 mmol/L (ref 20–29)
Calcium: 9 mg/dL (ref 8.7–10.2)
Chloride: 105 mmol/L (ref 96–106)
Creatinine, Ser: 0.49 mg/dL — ABNORMAL LOW (ref 0.57–1.00)
GFR calc Af Amer: 156 mL/min/{1.73_m2} (ref >59)
GFR calc non Af Amer: 135 mL/min/{1.73_m2} (ref >59)
Globulin, Total: 2.6 g/dL (ref 1.5–4.5)
Glucose: 82 mg/dL (ref 65–99)
Potassium: 4.3 mmol/L (ref 3.5–5.2)
Sodium: 141 mmol/L (ref 134–144)
Total Protein: 6.7 g/dL (ref 6.0–8.5)

## 2018-07-27 LAB — TSH: TSH: 1.18 u[IU]/mL (ref 0.450–4.500)

## 2018-07-27 MED ORDER — LEVOTHYROXINE SODIUM 200 MCG PO TABS: 200.0000 ug | ORAL_TABLET | Freq: Every day | ORAL | 6 refills | Status: DC

## 2018-08-18 MED FILL — FLUoxetine HCL 20 MG TABS: 20 | 30 days supply | Qty: 30 | Fill #0

## 2018-08-18 MED FILL — OMEPRAZOLE 20 MG CAP: 20 | 30 days supply | Qty: 30 | Fill #0

## 2018-08-25 MED FILL — LEVOTHYROXINE 200 MCG TAB: 200 | 90 days supply | Qty: 90 | Fill #0

## 2018-11-28 ENCOUNTER — Encounter: Payer: Self-pay | Admitting: Family Medicine

## 2018-11-28 ENCOUNTER — Ambulatory Visit (INDEPENDENT_AMBULATORY_CARE_PROVIDER_SITE_OTHER): Payer: Self-pay | Admitting: *Deleted

## 2018-11-28 ENCOUNTER — Other Ambulatory Visit: Payer: Self-pay

## 2018-11-28 DIAGNOSIS — Z3490 Encounter for supervision of normal pregnancy, unspecified, unspecified trimester: Secondary | ICD-10-CM

## 2018-11-28 LAB — POCT PREGNANCY, URINE: Preg Test, Ur: POSITIVE — AB

## 2018-11-28 MED ORDER — PRENATAL VITAMIN PLUS LOW IRON 27-1 MG PO TABS: 1.0000 | ORAL_TABLET | Freq: Every day | ORAL | 11 refills | Status: DC

## 2018-11-28 NOTE — Progress Notes (Signed)
Chart reviewed, PNV also sent to patient's pharmacy and notified her by my chart message, agree with plan.

## 2018-11-28 NOTE — Addendum Note (Signed)
Addended by: Clayton Lefort on: 11/28/2018 07:32 PM   Modules accepted: Orders

## 2018-11-28 NOTE — Progress Notes (Signed)
Pt took 2 upts and both positive. LMP 10/25/2018. Denies any vag bleeding. Breasts sore and some nausea but no vomiting. Pt currently goes to [facility] for primary care and will talk with them about Proza and Vistaril. With last pregnancy stopped Prozac and did fine and resumed pp. Wants to talk with her primary pcp about meds. Told ok to take Tylenol now as needed but not Ultram or Aleve. Pt undecided where she will receive PNC. Discussed POC and pt agrees and voices understanding

## 2018-11-29 ENCOUNTER — Ambulatory Visit: Payer: Self-pay | Attending: Family Medicine | Admitting: Family Medicine

## 2018-11-29 ENCOUNTER — Encounter: Payer: Self-pay | Admitting: Family Medicine

## 2018-11-29 VITALS — BP 124/84 | HR 93 | Temp 98.0°F | Ht 64.0 in | Wt 268.0 lb

## 2018-11-29 DIAGNOSIS — F329 Major depressive disorder, single episode, unspecified: Secondary | ICD-10-CM

## 2018-11-29 DIAGNOSIS — F419 Anxiety disorder, unspecified: Secondary | ICD-10-CM

## 2018-11-29 DIAGNOSIS — Z3491 Encounter for supervision of normal pregnancy, unspecified, first trimester: Secondary | ICD-10-CM

## 2018-11-29 DIAGNOSIS — F32A Depression, unspecified: Secondary | ICD-10-CM

## 2018-11-29 DIAGNOSIS — E039 Hypothyroidism, unspecified: Secondary | ICD-10-CM

## 2018-11-29 MED ORDER — FLUOXETINE HCL 20 MG PO TABS: 10.0000 mg | ORAL_TABLET | Freq: Every day | ORAL | 0 refills | Status: DC

## 2018-11-29 MED FILL — PRENATAL FE 27-1 MG TAB: 27-1 | 30 days supply | Qty: 30 | Fill #0

## 2018-11-29 MED FILL — FLUoxetine HCL 20 MG TABS: 20 | 19 days supply | Qty: 30 | Fill #0

## 2018-11-29 NOTE — Progress Notes (Signed)
Subjective:  Patient ID: LITITIA POSA, female    DOB: [birth year]  Age: 28 y.o. MRN: [id number]  CC:  Chief Complaint  Patient presents with  . Amenorrhea     HPI EILISH CHEY is a 28 year old female with a history of anxiety and depression, hypothyroidism, chronic low back pain who presents today for a referral to OBGYN. LMP was on 10/25/2018. She is currently on Prozac and hydroxyzine for anxiety and depression but has stopped her hydroxyzine and did not want to self Discontinue Prozac.  With her last pregnancy, she discontinued Prozac and resumed postpartum.  She has been compliant with levothyroxine for hypothyroidism. She is unable to take the regular prenatal vitamins due to iron content which causes nausea and is wondering if she can use OTC gummy vitamins until OB appointment; it looks like prenatal vitamins had already been sent to her pharmacy by Dr. Dione Plover.  Past Medical History:  Diagnosis Date  . Anxiety   . Asthma    "grew out of it" (11/16/2012)  . Chronic back pain   . Complication of anesthesia    "takes a lot to put me to sleep; woke up completely mid endoscopy" (11/16/2012)  . Depression   . Dysrhythmia    ST (11/16/2012)  . Gastric reflux   . 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 Iron deficiency anemia   . Migraines    "weekly" (11/16/2012)  . Ovarian cyst   . Pregnancy induced hypertension   . Schizophrenia ([place])    "moderate" (11/16/2012)  . Scoliosis     Past Surgical History:  Procedure Laterality Date  . LAPAROSCOPIC OVARIAN CYSTECTOMY Left 02/11/2016   Procedure: LAPAROSCOPIC OVARIAN CYSTECTOMY;  Surgeon: Emily Filbert, MD;  Location: [facility];  Service: Gynecology;  Laterality: Left;  . UPPER GI ENDOSCOPY  ~ 2006; ~2008    Family History  Problem Relation Age of Onset  . Diabetes Mother   . Hypertension Mother   . Vision loss Mother   . Hypertension Father   . Hyperlipidemia Father   . Thyroid disease Maternal  Grandmother     Allergies  Allergen Reactions  . Cold & Cough Daytime [Acetaminophen-Dm] Shortness Of Breath    Could not tolerate- orange liquid, may have contained Pseudoephedrine- "I could not tolerate"  . Sulfa Antibiotics Swelling  . Abilify [Aripiprazole] Other (See Comments)    Lethargy, unable to function  . Amoxicillin-Pot Clavulanate Other (See Comments)    Stomach pains Has patient had a PCN reaction causing immediate rash, facial/tongue/throat swelling, SOB or lightheadedness with hypotension: No Has patient had a PCN reaction causing severe rash involving mucus membranes or skin necrosis: No Has patient had a PCN reaction that required hospitalization No Has patient had a PCN reaction occurring within the last 10 years: Yes If all of the above answers are "NO", then may proceed with Cephalosporin use.   . Cephalexin Other (See Comments)    Stomach pains Pt taking Cefadroxil as outpatient  . Ferrous Sulfate Nausea And Vomiting  . Latex Swelling and Other (See Comments)    Burning of skin, too  . Tape Other (See Comments)    Swelling, burning of skin .   Marland Kitchen Zicam Cold Remedy [Erysidoron #1] Nausea And Vomiting  . Keflex [Cephalexin] Rash and Other (See Comments)    Rash--able to take w/ Benadryl  . Vicodin [Hydrocodone-Acetaminophen] Hives and Rash    Per pt- makes her very sleepy, feels like she isn't getting her  breath. States she does not have throat/tongue swelling or anaphylaxis.    Outpatient Medications Prior to Visit  Medication Sig Dispense Refill  . acetaminophen (TYLENOL) 500 MG tablet Take 1,000 mg by mouth every 8 (eight) hours as needed (for pain or headaches).    . hydrOXYzine (ATARAX/VISTARIL) 25 MG tablet Take 1 tablet (25 mg total) by mouth every 8 (eight) hours as needed. 60 tablet 6  . levothyroxine (SYNTHROID) 200 MCG tablet Take 1 tablet (200 mcg total) by mouth daily before breakfast. 30 tablet 6  . omeprazole (PRILOSEC) 20 MG capsule Take 1  capsule (20 mg total) by mouth daily. 30 capsule 6  . FLUoxetine (PROZAC) 20 MG tablet Take 1 tablet (20 mg total) by mouth daily. 30 tablet 6  . naproxen sodium (ALEVE) 220 MG tablet Take 220-440 mg by mouth 2 (two) times daily as needed (for pain or headaches).    . ondansetron (ZOFRAN) 4 MG tablet Take 1 tablet (4 mg total) by mouth every 8 (eight) hours as needed for nausea or vomiting. (Patient not taking: Reported on 11/28/2018) 20 tablet 0  . Prenatal Vit-Fe Fumarate-FA (PRENATAL VITAMIN PLUS LOW IRON) 27-1 MG TABS Take 1 tablet by mouth daily. (Patient not taking: Reported on 11/29/2018) 30 tablet 11  . traMADol (ULTRAM) 50 MG tablet Take 1 tablet (50 mg total) by mouth every 12 (twelve) hours as needed. (Patient not taking: Reported on 11/29/2018) 60 tablet 1   No facility-administered medications prior to visit.      ROS Review of Systems  Constitutional: Negative for activity change, appetite change and fatigue.  HENT: Negative for congestion, sinus pressure and sore throat.   Eyes: Negative for visual disturbance.  Respiratory: Negative for cough, chest tightness, shortness of breath and wheezing.   Cardiovascular: Negative for chest pain and palpitations.  Gastrointestinal: Negative for abdominal distention, abdominal pain and constipation.  Endocrine: Negative for polydipsia.  Genitourinary: Negative for dysuria and frequency.  Musculoskeletal: Negative for arthralgias and back pain.  Skin: Negative for rash.  Neurological: Negative for tremors, light-headedness and numbness.  Hematological: Does not bruise/bleed easily.  Psychiatric/Behavioral: Negative for agitation and behavioral problems.    Objective:  BP 124/84   Pulse 93   Temp 98 F (36.7 C) (Oral)   Ht 5\' 4"  (1.626 m)   Wt 268 lb (121.6 kg)   SpO2 (!) 9%   BMI 46.00 kg/m   BP/Weight 11/29/2018 03/28/2018 [id number]  Systolic BP A999333 [id number]  Diastolic BP 84 68 90  Wt. (Lbs) 268 275 -  BMI 46 47.2 -  Some  encounter information is confidential and restricted. Go to Review Flowsheets activity to see all data.      Physical Exam Constitutional:      Appearance: She is well-developed.  Neck:     Vascular: No JVD.  Cardiovascular:     Rate and Rhythm: Normal rate.     Heart sounds: Normal heart sounds. No murmur.  Pulmonary:     Effort: Pulmonary effort is normal.     Breath sounds: Normal breath sounds. No wheezing or rales.  Chest:     Chest wall: No tenderness.  Abdominal:     General: Bowel sounds are normal. There is no distension.     Palpations: Abdomen is soft. There is no mass.     Tenderness: There is no abdominal tenderness.  Musculoskeletal: Normal range of motion.     Right lower leg: No edema.     Left lower  leg: No edema.  Neurological:     Mental Status: She is alert and oriented to person, place, and time.  Psychiatric:        Mood and Affect: Mood normal.     CMP Latest Ref Rng & Units 07/26/2018 03/16/2018 04/16/2017  Glucose 65 - 99 mg/dL 82 110(H) 96  BUN 6 - 20 mg/dL 7 8 6   Creatinine 0.57 - 1.00 mg/dL 0.49(L) 0.48 0.56  Sodium 134 - 144 mmol/L 141 137 140  Potassium 3.5 - 5.2 mmol/L 4.3 3.4(L) 4.1  Chloride 96 - 106 mmol/L 105 106 105  CO2 20 - 29 mmol/L 22 22 24   Calcium 8.7 - 10.2 mg/dL 9.0 9.1 9.0  Total Protein 6.0 - 8.5 g/dL 6.7 - 7.1  Total Bilirubin 0.0 - 1.2 mg/dL 0.4 - 1.2  Alkaline Phos 39 - 117 IU/L 91 - 91  AST 0 - 40 IU/L 16 - 25  ALT 0 - 32 IU/L 20 - 19    Lipid Panel     Component Value Date/Time   CHOL 178 06/08/2016 0936   TRIG 237 (H) 06/08/2016 0936   HDL 41 06/08/2016 0936   CHOLHDL 4.3 06/08/2016 0936   CHOLHDL 4.4 08/07/2013 1141   VLDL 29 08/07/2013 1141   LDLCALC 90 06/08/2016 0936    CBC    Component Value Date/Time   WBC 16.6 (H) 03/16/2018 2045   RBC 4.51 03/16/2018 2045   HGB 11.7 (L) 03/16/2018 2045   HCT 38.1 03/16/2018 2045   PLT 394 03/16/2018 2045   MCV 84.5 03/16/2018 2045   MCH 25.9 (L) 03/16/2018  2045   MCHC 30.7 03/16/2018 2045   RDW 14.0 03/16/2018 2045   LYMPHSABS 3.7 06/09/2016 1650   MONOABS 0.9 06/09/2016 1650   EOSABS 0.2 06/09/2016 1650   BASOSABS 0.0 06/09/2016 1650    Lab Results  Component Value Date   HGBA1C 5.2 02/25/2012    Assessment & Plan:   1. Anxiety and depression Controlled Provided instructions on tapering off Prozac Discontinue hydroxyzine - FLUoxetine (PROZAC) 20 MG tablet; Take 0.5 tablets (10 mg total) by mouth daily. For two weeks then 0.5tabs every other day for the next 2 weeks then stop  Dispense: 30 tablet; Refill: 0  2. First trimester pregnancy Provided letter to assist with Medicaid Advise against using tramadol for pain; advised to use Tylenol as needed For use prenatal gummy vitamin until OB appointment. - Ambulatory referral to Obstetrics / Gynecology  3.  Hypothyroidism Continue current dose of levothyroxine and regimen will be adjusted by OB/GYN as anticipate need for an increase in dose due to pregnancy  Meds ordered this encounter  Medications  . FLUoxetine (PROZAC) 20 MG tablet    Sig: Take 0.5 tablets (10 mg total) by mouth daily. For two weeks then 0.5tabs every other day for the next 2 weeks then stop    Dispense:  30 tablet    Refill:  0    Follow-up: Return for Medical conditions, return after completion of prenatal care.       Charlott Rakes, MD, FAAFP. [facility] [place], [place]   11/29/2018, 9:34 AM

## 2018-11-29 NOTE — Progress Notes (Signed)
Patient needs referral to women's for current pregnancy.Kimberly [name]

## 2018-11-29 NOTE — Patient Instructions (Signed)
Take Prozac 20mg , half tablet for two weeks then half tab every other day for the next 2 weeks then stop

## 2018-12-01 ENCOUNTER — Telehealth: Payer: Self-pay | Admitting: Family Medicine

## 2018-12-01 NOTE — Telephone Encounter (Signed)
The patient called in stating she was told to call in when she was ready for an ultrasound and she stated she would like one. The referral has to be placed before she can be scheduled.

## 2018-12-12 MED FILL — OMEPRAZOLE 20 MG CAP: 20 | 90 days supply | Qty: 90 | Fill #1

## 2018-12-12 MED FILL — LEVOTHYROXINE 200 MCG TAB: 200 | 90 days supply | Qty: 90 | Fill #1

## 2018-12-17 ENCOUNTER — Encounter (HOSPITAL_COMMUNITY): Payer: Self-pay

## 2018-12-17 ENCOUNTER — Inpatient Hospital Stay (HOSPITAL_COMMUNITY)
Admission: AD | Admit: 2018-12-17 | Discharge: 2018-12-17 | Disposition: A | Discharge status: Home / Self Care | Payer: Medicaid Other | Attending: Obstetrics and Gynecology | Admitting: Obstetrics and Gynecology

## 2018-12-17 ENCOUNTER — Inpatient Hospital Stay (HOSPITAL_COMMUNITY): Payer: Medicaid Other

## 2018-12-17 ENCOUNTER — Other Ambulatory Visit: Payer: Self-pay

## 2018-12-17 DIAGNOSIS — E039 Hypothyroidism, unspecified: Secondary | ICD-10-CM | POA: Insufficient documentation

## 2018-12-17 DIAGNOSIS — Z3A01 Less than 8 weeks gestation of pregnancy: Secondary | ICD-10-CM

## 2018-12-17 DIAGNOSIS — F209 Schizophrenia, unspecified: Secondary | ICD-10-CM | POA: Insufficient documentation

## 2018-12-17 DIAGNOSIS — O209 Hemorrhage in early pregnancy, unspecified: Secondary | ICD-10-CM

## 2018-12-17 DIAGNOSIS — O99511 Diseases of the respiratory system complicating pregnancy, first trimester: Secondary | ICD-10-CM | POA: Insufficient documentation

## 2018-12-17 DIAGNOSIS — Z79899 Other long term (current) drug therapy: Secondary | ICD-10-CM | POA: Insufficient documentation

## 2018-12-17 DIAGNOSIS — O99341 Other mental disorders complicating pregnancy, first trimester: Secondary | ICD-10-CM | POA: Insufficient documentation

## 2018-12-17 DIAGNOSIS — F329 Major depressive disorder, single episode, unspecified: Secondary | ICD-10-CM | POA: Insufficient documentation

## 2018-12-17 DIAGNOSIS — O208 Other hemorrhage in early pregnancy: Secondary | ICD-10-CM | POA: Insufficient documentation

## 2018-12-17 DIAGNOSIS — J45909 Unspecified asthma, uncomplicated: Secondary | ICD-10-CM | POA: Insufficient documentation

## 2018-12-17 DIAGNOSIS — M419 Scoliosis, unspecified: Secondary | ICD-10-CM | POA: Insufficient documentation

## 2018-12-17 DIAGNOSIS — O99281 Endocrine, nutritional and metabolic diseases complicating pregnancy, first trimester: Secondary | ICD-10-CM | POA: Insufficient documentation

## 2018-12-17 DIAGNOSIS — O99611 Diseases of the digestive system complicating pregnancy, first trimester: Secondary | ICD-10-CM | POA: Insufficient documentation

## 2018-12-17 DIAGNOSIS — O4690 Antepartum hemorrhage, unspecified, unspecified trimester: Secondary | ICD-10-CM

## 2018-12-17 DIAGNOSIS — F419 Anxiety disorder, unspecified: Secondary | ICD-10-CM | POA: Insufficient documentation

## 2018-12-17 DIAGNOSIS — O26891 Other specified pregnancy related conditions, first trimester: Secondary | ICD-10-CM | POA: Insufficient documentation

## 2018-12-17 DIAGNOSIS — K219 Gastro-esophageal reflux disease without esophagitis: Secondary | ICD-10-CM | POA: Insufficient documentation

## 2018-12-17 LAB — WET PREP, GENITAL
Clue Cells Wet Prep HPF POC: NONE SEEN
Sperm: NONE SEEN
Trich, Wet Prep: NONE SEEN
Yeast Wet Prep HPF POC: NONE SEEN

## 2018-12-17 LAB — URINALYSIS, MICROSCOPIC (REFLEX)

## 2018-12-17 LAB — URINALYSIS, ROUTINE W REFLEX MICROSCOPIC
Bilirubin Urine: NEGATIVE
Glucose, UA: NEGATIVE mg/dL
Ketones, ur: 15 mg/dL — AB
Nitrite: NEGATIVE
Protein, ur: NEGATIVE mg/dL
Specific Gravity, Urine: >1.03 — ABNORMAL HIGH (ref 1.005–1.030)
pH: 5.5 (ref 5.0–8.0)

## 2018-12-17 LAB — CBC
HCT: 35.1 % — ABNORMAL LOW (ref 36.0–46.0)
Hemoglobin: 10.6 g/dL — ABNORMAL LOW (ref 12.0–15.0)
MCH: 24.7 pg — ABNORMAL LOW (ref 26.0–34.0)
MCHC: 30.2 g/dL (ref 30.0–36.0)
MCV: 81.6 fL (ref 80.0–100.0)
Platelets: 357 10*3/uL (ref 150–400)
RBC: 4.3 MIL/uL (ref 3.87–5.11)
RDW: 14.7 % (ref 11.5–15.5)
WBC: 9.6 10*3/uL (ref 4.0–10.5)
nRBC: 0 % (ref 0.0–0.2)

## 2018-12-17 LAB — HCG, QUANTITATIVE, PREGNANCY: hCG, Beta Chain, Quant, S: 29952 m[IU]/mL — ABNORMAL HIGH (ref <5)

## 2018-12-17 NOTE — MAU Note (Signed)
Pt reports to mau with c/o 1 episode of vaginal bleeding this morning.  Pt states that when she went to the restroom she noticed some dark red bleeding upon wiping.  Pt states she has not had any bleeding since first episode.  Pt denies any recent intercourse.  Denies any abd pain at this time.

## 2018-12-17 NOTE — Discharge Instructions (Signed)
Ovarian Cyst     An ovarian cyst is a fluid-filled sac that forms on an ovary. The ovaries are small organs that produce eggs in women. Various types of cysts can form on the ovaries. Some may cause symptoms and require treatment. Most ovarian cysts go away on their own, are not cancerous (are benign), and do not cause problems. Common types of ovarian cysts include:  Functional (follicle) cysts. ? Occur during the menstrual cycle, and usually go away with the next menstrual cycle if you do not get pregnant. ? Usually cause no symptoms.  Endometriomas. ? Are cysts that form from the tissue that lines the uterus (endometrium). ? Are sometimes called chocolate cysts because they become filled with blood that turns brown. ? Can cause pain in the lower abdomen during intercourse and during your period.  Cystadenoma cysts. ? Develop from cells on the outside surface of the ovary. ? Can get very large and cause lower abdomen pain and pain with intercourse. ? Can cause severe pain if they twist or break open (rupture).  Dermoid cysts. ? Are sometimes found in both ovaries. ? May contain different kinds of body tissue, such as skin, teeth, hair, or cartilage. ? Usually do not cause symptoms unless they get very big.  Theca lutein cysts. ? Occur when too much of a certain hormone (human chorionic gonadotropin) is produced and overstimulates the ovaries to produce an egg. ? Are most common after having procedures used to assist with the conception of a baby (in vitro fertilization). What are the causes? Ovarian cysts may be caused by:  Ovarian hyperstimulation syndrome. This is a condition that can develop from taking fertility medicines. It causes multiple large ovarian cysts to form.  Polycystic ovarian syndrome (PCOS). This is a common hormonal disorder that can cause ovarian cysts, as well as problems with your period or fertility. What increases the risk? The following factors may  make you more likely to develop ovarian cysts:  Being overweight or obese.  Taking fertility medicines.  Taking certain forms of hormonal birth control.  Smoking. What are the signs or symptoms? Many ovarian cysts do not cause symptoms. If symptoms are present, they may include:  Pelvic pain or pressure.  Pain in the lower abdomen.  Pain during sex.  Abdominal swelling.  Abnormal menstrual periods.  Increasing pain with menstrual periods. How is this diagnosed? These cysts are commonly found during a routine pelvic exam. You may have tests to find out more about the cyst, such as:  Ultrasound.  X-ray of the pelvis.  CT scan.  MRI.  Blood tests. How is this treated? Many ovarian cysts go away on their own without treatment. Your health care provider may want to check your cyst regularly for 2-3 months to see if it changes. If you are in menopause, it is especially important to have your cyst monitored closely because menopausal women have a higher rate of ovarian cancer. When treatment is needed, it may include:  Medicines to help relieve pain.  A procedure to drain the cyst (aspiration).  Surgery to remove the whole cyst.  Hormone treatment or birth control pills. These methods are sometimes used to help dissolve a cyst. Follow these instructions at home:  Take over-the-counter and prescription medicines only as told by your health care provider.  Do not drive or use heavy machinery while taking prescription pain medicine.  Get regular pelvic exams and Pap tests as often as told by your health care provider.  Return to your normal activities as told by your health care provider. Ask your health care provider what activities are safe for you.  Do not use any products that contain nicotine or tobacco, such as cigarettes and e-cigarettes. If you need help quitting, ask your health care provider.  Keep all follow-up visits as told by your health care provider.  This is important. Contact a health care provider if:  Your periods are late, irregular, or painful, or they stop.  You have pelvic pain that does not go away.  You have pressure on your bladder or trouble emptying your bladder completely.  You have pain during sex.  You have any of the following in your abdomen: ? A feeling of fullness. ? Pressure. ? Discomfort. ? Pain that does not go away. ? Swelling.  You feel generally ill.  You become constipated.  You lose your appetite.  You develop severe acne.  You start to have more body hair and facial hair.  You are gaining weight or losing weight without changing your exercise and eating habits.  You think you may be pregnant. Get help right away if:  You have abdominal pain that is severe or gets worse.  You cannot eat or drink without vomiting.  You suddenly develop a fever.  Your menstrual period is much heavier than usual. This information is not intended to replace advice given to you by your health care provider. Make sure you discuss any questions you have with your health care provider. Document Released: 01/05/2005 Document Revised: 04/05/2017 Document Reviewed: 06/09/2015 Elsevier Patient Education  2020 [place] Hematoma  A subchorionic hematoma is a gathering of blood between the outer wall of the embryo (chorion) and the inner wall of the womb (uterus). This condition can cause vaginal bleeding. If they cause little or no vaginal bleeding, early small hematomas usually shrink on their own and do not affect your baby or pregnancy. When bleeding starts later in pregnancy, or if the hematoma is larger or occurs in older pregnant women, the condition may be more serious. Larger hematomas may get bigger, which increases the chances of miscarriage. This condition also increases the risk of:  Premature separation of the placenta from the uterus.  Premature (preterm) labor.  Stillbirth. What  are the causes? The exact cause of this condition is not known. It occurs when blood is trapped between the placenta and the uterine wall because the placenta has separated from the original site of implantation. What increases the risk? You are more likely to develop this condition if:  You were treated with fertility medicines.  You conceived through in vitro fertilization (IVF). What are the signs or symptoms? Symptoms of this condition include:  Vaginal spotting or bleeding.  Contractions of the uterus. These cause abdominal pain. Sometimes you may have no symptoms and the bleeding may only be seen when ultrasound images are taken (transvaginal ultrasound). How is this diagnosed? This condition is diagnosed based on a physical exam. This includes a pelvic exam. You may also have other tests, including:  Blood tests.  Urine tests.  Ultrasound of the abdomen. How is this treated? Treatment for this condition can vary. Treatment may include:  Watchful waiting. You will be monitored closely for any changes in bleeding. During this stage: ? The hematoma may be reabsorbed by the body. ? The hematoma may separate the fluid-filled space containing the embryo (gestational sac) from the wall of the womb (endometrium).  Medicines.  Activity restriction. This  may be needed until the bleeding stops. Follow these instructions at home:  Stay on bed rest if told to do so by your health care provider.  Do not lift anything that is heavier than 10 lbs. (4.5 kg) or as told by your health care provider.  Do not use any products that contain nicotine or tobacco, such as cigarettes and e-cigarettes. If you need help quitting, ask your health care provider.  Track and write down the number of pads you use each day and how soaked (saturated) they are.  Do not use tampons.  Keep all follow-up visits as told by your health care provider. This is important. Your health care provider may ask you  to have follow-up blood tests or ultrasound tests or both. Contact a health care provider if:  You have any vaginal bleeding.  You have a fever. Get help right away if:  You have severe cramps in your stomach, back, abdomen, or pelvis.  You pass large clots or tissue. Save any tissue for your health care provider to look at.  You have more vaginal bleeding, and you faint or become lightheaded or weak. Summary  A subchorionic hematoma is a gathering of blood between the outer wall of the placenta and the uterus.  This condition can cause vaginal bleeding.  Sometimes you may have no symptoms and the bleeding may only be seen when ultrasound images are taken.  Treatment may include watchful waiting, medicines, or activity restriction. This information is not intended to replace advice given to you by your health care provider. Make sure you discuss any questions you have with your health care provider. Document Released: 04/22/2006 Document Revised: 12/18/2016 Document Reviewed: 03/03/2016 Elsevier Patient Education  2020 [employer].

## 2018-12-17 NOTE — MAU Provider Note (Signed)
History     CSN: [id number]  Arrival date and time: 12/17/18 1334   First Provider Initiated Contact with Patient 12/17/18 1438      Chief Complaint  Patient presents with  . Vaginal Bleeding   Kimberly [name] is a 28 y.o. G3P1011 at [redacted]w[redacted]d who receives care at [facility].  She presents today for Vaginal Bleeding.  She reports a history of hypertension with her previous pregnancy as well as a history of miscarriage and has concerns.  She states the bleeding started this morning around 11 am and she noticed blood after wiping.  She states it was "like the start of your period" and more was noted with successive wiping.  However, she states she had no spotting upon arrival to the MAU and denies cramping.  She denies sexual activity in the past 3 days and vaginal discharge.      OB History    Gravida  3   Para  1   Term  1   Preterm      AB  1   Living  1     SAB  1   TAB      Ectopic      Multiple  0   Live Births  1           Past Medical History:  Diagnosis Date  . Anxiety   . Asthma    "grew out of it" (11/16/2012)  . Chronic back pain   . Complication of anesthesia    "takes a lot to put me to sleep; woke up completely mid endoscopy" (11/16/2012)  . Depression   . Dysrhythmia    ST (11/16/2012)  . Gastric reflux   . 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 Iron deficiency anemia   . Migraines    "weekly" (11/16/2012)  . Ovarian cyst   . Pregnancy induced hypertension   . Schizophrenia ([place])    "moderate" (11/16/2012)  . Scoliosis     Past Surgical History:  Procedure Laterality Date  . LAPAROSCOPIC OVARIAN CYSTECTOMY Left 02/11/2016   Procedure: LAPAROSCOPIC OVARIAN CYSTECTOMY;  Surgeon: Emily Filbert, MD;  Location: [facility];  Service: Gynecology;  Laterality: Left;  . UPPER GI ENDOSCOPY  ~ 2006; ~2008    Family History  Problem Relation Age of Onset  . Diabetes Mother   . Hypertension Mother   . Vision loss Mother    . Hypertension Father   . Hyperlipidemia Father   . Thyroid disease Maternal Grandmother     Social History   Tobacco Use  . Smoking status: Never Smoker  . Smokeless tobacco: Never Used  Substance Use Topics  . Alcohol use: Not Currently    Comment: very rare  . Drug use: No    Allergies:  Allergies  Allergen Reactions  . Cold & Cough Daytime [Acetaminophen-Dm] Shortness Of Breath    Could not tolerate- orange liquid, may have contained Pseudoephedrine- "I could not tolerate"  . Sulfa Antibiotics Swelling  . Abilify [Aripiprazole] Other (See Comments)    Lethargy, unable to function  . Amoxicillin-Pot Clavulanate Other (See Comments)    Stomach pains Has patient had a PCN reaction causing immediate rash, facial/tongue/throat swelling, SOB or lightheadedness with hypotension: No Has patient had a PCN reaction causing severe rash involving mucus membranes or skin necrosis: No Has patient had a PCN reaction that required hospitalization No Has patient had a PCN reaction occurring within the last 10 years: Yes If all of  the above answers are "NO", then may proceed with Cephalosporin use.   . Cephalexin Other (See Comments)    Stomach pains Pt taking Cefadroxil as outpatient  . Ferrous Sulfate Nausea And Vomiting  . Latex Swelling and Other (See Comments)    Burning of skin, too  . Tape Other (See Comments)    Swelling, burning of skin .   Marland Kitchen Zicam Cold Remedy [Erysidoron #1] Nausea And Vomiting  . Keflex [Cephalexin] Rash and Other (See Comments)    Rash--able to take w/ Benadryl  . Vicodin [Hydrocodone-Acetaminophen] Hives and Rash    Per pt- makes her very sleepy, feels like she isn't getting her breath. States she does not have throat/tongue swelling or anaphylaxis.    Medications Prior to Admission  Medication Sig Dispense Refill Last Dose  . FLUoxetine (PROZAC) 20 MG tablet Take 0.5 tablets (10 mg total) by mouth daily. For two weeks then 0.5tabs every other day  for the next 2 weeks then stop 30 tablet 0 12/16/2018 at Unknown time  . levothyroxine (SYNTHROID) 200 MCG tablet Take 1 tablet (200 mcg total) by mouth daily before breakfast. 30 tablet 6 12/17/2018 at Unknown time  . acetaminophen (TYLENOL) 500 MG tablet Take 1,000 mg by mouth every 8 (eight) hours as needed (for pain or headaches).     . hydrOXYzine (ATARAX/VISTARIL) 25 MG tablet Take 1 tablet (25 mg total) by mouth every 8 (eight) hours as needed. 60 tablet 6   . omeprazole (PRILOSEC) 20 MG capsule Take 1 capsule (20 mg total) by mouth daily. 30 capsule 6   . ondansetron (ZOFRAN) 4 MG tablet Take 1 tablet (4 mg total) by mouth every 8 (eight) hours as needed for nausea or vomiting. (Patient not taking: Reported on 11/28/2018) 20 tablet 0   . Prenatal Vit-Fe Fumarate-FA (PRENATAL VITAMIN PLUS LOW IRON) 27-1 MG TABS Take 1 tablet by mouth daily. (Patient not taking: Reported on 11/29/2018) 30 tablet 11     Review of Systems  Constitutional: Negative for chills and fever.  Respiratory: Negative for cough and shortness of breath.   Gastrointestinal: Positive for nausea. Negative for abdominal pain, constipation, diarrhea and vomiting.  Genitourinary: Positive for vaginal bleeding. Negative for difficulty urinating, dysuria, pelvic pain and vaginal discharge.  Musculoskeletal: Negative for back pain.  Neurological: Positive for headaches (H/O Migraines, No current HA.). Negative for dizziness and light-headedness.   Physical Exam   Blood pressure 130/87, pulse (!) 115, temperature 98.5 F (36.9 C), temperature source Oral, resp. rate 18, last menstrual period 10/25/2018, SpO2 98 %.  Physical Exam  Constitutional: She is oriented to person, place, and time.  Obese  HENT:  Head: Normocephalic and atraumatic.  Eyes: Conjunctivae are normal.  Neck: Normal range of motion.  Cardiovascular: Normal rate, regular rhythm and normal heart sounds.  Respiratory: Effort normal and breath sounds  normal.  GI: Bowel sounds are normal. There is no abdominal tenderness.  Genitourinary: Cervix exhibits no motion tenderness, no discharge and no friability.    No vaginal bleeding.  No bleeding in the vagina.    Genitourinary Comments: Speculum Exam: -Normal External Genitalia: Non tender, no apparent discharge at introitus.  -Vaginal Vault: Pink mucosa with good rugae. Scant amt blood-tinged mucous in posterior fornix -wet prep collected -Cervix:Pink, no lesions, cysts, or polyps.  Appears closed. No active bleeding from os-GC/CT collected -Bimanual Exam:  Difficult to assess d/t panus   Musculoskeletal: Normal range of motion.  Neurological: She is alert and oriented to person,  place, and time.  Skin: Skin is warm and dry.  Psychiatric: She has a normal mood and affect. Her behavior is normal.    MAU Course  Procedures Results for orders placed or performed during the hospital encounter of 12/17/18 (from the past 24 hour(s))  Urinalysis, Routine w reflex microscopic     Status: Abnormal   Collection Time: 12/17/18  2:06 PM  Result Value Ref Range   Color, Urine YELLOW YELLOW   APPearance CLOUDY (A) CLEAR   Specific Gravity, Urine >1.030 (H) 1.005 - 1.030   pH 5.5 5.0 - 8.0   Glucose, UA NEGATIVE NEGATIVE mg/dL   Hgb urine dipstick LARGE (A) NEGATIVE   Bilirubin Urine NEGATIVE NEGATIVE   Ketones, ur 15 (A) NEGATIVE mg/dL   Protein, ur NEGATIVE NEGATIVE mg/dL   Nitrite NEGATIVE NEGATIVE   Leukocytes,Ua SMALL (A) NEGATIVE  Urinalysis, Microscopic (reflex)     Status: Abnormal   Collection Time: 12/17/18  2:06 PM  Result Value Ref Range   RBC / HPF 0-5 0 - 5 RBC/hpf   WBC, UA 11-20 0 - 5 WBC/hpf   Bacteria, UA RARE (A) NONE SEEN   Squamous Epithelial / LPF 11-20 0 - 5   Mucus PRESENT   Wet prep, genital     Status: Abnormal   Collection Time: 12/17/18  2:50 PM   Specimen: PATH Cytology Cervicovaginal Ancillary Only  Result Value Ref Range   Yeast Wet Prep HPF POC NONE SEEN  NONE SEEN   Trich, Wet Prep NONE SEEN NONE SEEN   Clue Cells Wet Prep HPF POC NONE SEEN NONE SEEN   WBC, Wet Prep HPF POC MODERATE (A) NONE SEEN   Sperm NONE SEEN   hCG, quantitative, pregnancy     Status: Abnormal   Collection Time: 12/17/18  3:31 PM  Result Value Ref Range   hCG, Beta Chain, Quant, S 29,952 (H) <5 mIU/mL  CBC     Status: Abnormal   Collection Time: 12/17/18  3:31 PM  Result Value Ref Range   WBC 9.6 4.0 - 10.5 K/uL   RBC 4.30 3.87 - 5.11 MIL/uL   Hemoglobin 10.6 (L) 12.0 - 15.0 g/dL   HCT 35.1 (L) 36.0 - 46.0 %   MCV 81.6 80.0 - 100.0 fL   MCH 24.7 (L) 26.0 - 34.0 pg   MCHC 30.2 30.0 - 36.0 g/dL   RDW 14.7 11.5 - 15.5 %   Platelets 357 150 - 400 K/uL   nRBC 0.0 0.0 - 0.2 %   US Ob Less Than 14 Weeks With Ob Transvaginal  Result Date: 12/17/2018 CLINICAL DATA:  Pregnant patient with vaginal bleeding. EXAM: OBSTETRIC <14 WK [place] AND TRANSVAGINAL OB US TECHNIQUE: Both transabdominal and transvaginal ultrasound examinations were performed for complete evaluation of the gestation as well as the maternal uterus, adnexal regions, and pelvic cul-de-sac. Transvaginal technique was performed to assess early pregnancy. COMPARISON:  Pelvic ultrasound 12/25/2015 FINDINGS: Intrauterine gestational sac: Single Yolk sac:  Visualized. Embryo:  Visualized. Cardiac Activity: Visualized. Heart Rate: 143 bpm CRL:  7.1 mm   6 w   4 d                  [place] EDC: 08/08/2019 Subchorionic hemorrhage:  Small Maternal uterus/adnexae: Normal appearing right and left ovaries. There is a large anechoic cyst within the right adnexa measuring 12.5 x 6.5 x 9.0 cm. IMPRESSION: 1. Single live intrauterine gestation. Small subchorionic hemorrhage. 2. Large (12.5 cm) incompletely visualized simple appearing  cystic lesion within the right adnexa. Recommend attention on follow-up ultrasounds. Recommend GYN consultation given history of pregnancy and size of cyst. Electronically Signed   By: Lovey Newcomer M.D.   On:  12/17/2018 16:41    MDM Pelvic Exam; Wet Prep and GC/CT Labs: UA, UPT, CBC, hCG Ultrasound Assessment and Plan  28 year old  G3P1011 at 7.4 weeks Vaginal Bleeding  -POC discussed. -Exam performed and findings discussed. -Cultures collected and pending.  -Labs ordered. -Will send for [place] and await results.   Maryann Conners, MSN, CNM 12/17/2018, 2:38 PM   Reassessment (5:04 PM) SIUP at 6.4 [facility] Ovarian Cyst  -[place] results reviewed and discussed. -Discussed changing of EDD based on findings. -Reviewed ovarian cyst and need for monitoring. -Educated on [facility] including what it is and bleeding expectations and precautions. -Discussed pelvic rest for 72 hours after bleeding cessation. -Bleeding Precautions given. -Instructed to keep appt as scheduled. -Encouraged to call or return to MAU if symptoms worsen or with the onset of new symptoms. -Discharged to home in stable condition.  Maryann Conners MSN, CNM Advanced Practice Provider, Center for [employer] [facility]

## 2018-12-20 LAB — GC/CHLAMYDIA PROBE AMP (~~LOC~~) NOT AT ARMC
Chlamydia: NEGATIVE
Comment: NEGATIVE
Comment: NORMAL
Neisseria Gonorrhea: NEGATIVE

## 2019-01-09 ENCOUNTER — Other Ambulatory Visit: Payer: Self-pay

## 2019-01-09 ENCOUNTER — Ambulatory Visit (INDEPENDENT_AMBULATORY_CARE_PROVIDER_SITE_OTHER): Payer: Medicaid Other | Admitting: *Deleted

## 2019-01-09 VITALS — Ht 64.0 in | Wt 245.0 lb

## 2019-01-09 DIAGNOSIS — O0991 Supervision of high risk pregnancy, unspecified, first trimester: Secondary | ICD-10-CM

## 2019-01-09 DIAGNOSIS — O219 Vomiting of pregnancy, unspecified: Secondary | ICD-10-CM

## 2019-01-09 DIAGNOSIS — Z3A09 9 weeks gestation of pregnancy: Secondary | ICD-10-CM

## 2019-01-09 DIAGNOSIS — O0990 Supervision of high risk pregnancy, unspecified, unspecified trimester: Secondary | ICD-10-CM | POA: Insufficient documentation

## 2019-01-09 MED ORDER — BLOOD PRESSURE MONITOR AUTOMAT DEVI: 1.0000 | Freq: Every day | 0 refills | Status: DC

## 2019-01-09 MED ORDER — PROMETHAZINE HCL 25 MG PO TABS: 25.0000 mg | ORAL_TABLET | Freq: Four times a day (QID) | ORAL | 1 refills | Status: DC | PRN

## 2019-01-09 NOTE — Progress Notes (Signed)
  Virtual Visit via Telephone Note  I connected with Kimberly [name] on 01/09/19 at  1:30 PM EST by telephone and verified that I am speaking with the correct person using two identifiers.  Location: Patient: Kimberly [name] MRN: [id number] Provider: Derl Barrow, RN   I discussed the limitations, risks, security and privacy concerns of performing an evaluation and management service by telephone and the availability of in person appointments. I also discussed with the patient that there may be a patient responsible charge related to this service. The patient expressed understanding and agreed to proceed.   History of Present Illness: PRENATAL INTAKE SUMMARY  Kimberly [name] presents today New OB Nurse Interview.  OB History    Gravida  3   Para  1   Term  1   Preterm      AB  1   Living  1     SAB  1   TAB      Ectopic      Multiple  0   Live Births  1          I have reviewed the patient's medical, obstetrical, social, and family histories, medications, and available lab results.  SUBJECTIVE She complains of nausea and vomiting.  Patient also stated she has a large right ovarian cyst. The ultrasound report that patient should have follow up regarding the cyst. Pt also has history of Hypothyroidism and PIH. Pt requested another ultrasound due to cyst and with her previous pregnancy she had visits every 2 weeks. Advised patient that due to [place]- visits are in-person and via [place].    Observations/Objective: Initial nurse interview for history/labs (New OB)  EDD: 08/08/2019 by early ultrasound GA: [redacted]w[redacted]d G3P1011 FHT: non face to face interview  GENERAL APPEARANCE: non face to face interview  Assessment and Plan: Normal pregnancy Prenatal care Labs/physical at next visit with midwife Rx sent to pharmacy for BP cuff Rx for Phenergan for nausea Pt to sign up for Bab[facility]s  Follow Up Instructions:   I discussed the assessment and treatment plan with the  patient. The patient was provided an opportunity to ask questions and all were answered. The patient agreed with the plan and demonstrated an understanding of the instructions.   The patient was advised to call back or seek an in-person evaluation if the symptoms worsen or if the condition fails to improve as anticipated.  I provided 15 minutes of non-face-to-face time during this encounter.   Derl Barrow, RN

## 2019-01-10 ENCOUNTER — Inpatient Hospital Stay: Payer: Medicaid Other | Admitting: Family Medicine

## 2019-01-18 ENCOUNTER — Encounter: Payer: Self-pay | Admitting: General Practice

## 2019-01-18 ENCOUNTER — Other Ambulatory Visit: Payer: Self-pay

## 2019-01-18 ENCOUNTER — Encounter: Payer: Self-pay | Admitting: Advanced Practice Midwife

## 2019-01-18 ENCOUNTER — Ambulatory Visit (INDEPENDENT_AMBULATORY_CARE_PROVIDER_SITE_OTHER): Payer: Medicaid Other | Admitting: Advanced Practice Midwife

## 2019-01-18 ENCOUNTER — Telehealth: Payer: Self-pay | Admitting: General Practice

## 2019-01-18 ENCOUNTER — Other Ambulatory Visit (HOSPITAL_COMMUNITY)
Admission: RE | Admit: 2019-01-18 | Discharge: 2019-01-18 | Disposition: A | Discharge status: Home / Self Care | Payer: Medicaid Other | Source: Ambulatory Visit | Attending: Advanced Practice Midwife | Admitting: Advanced Practice Midwife

## 2019-01-18 VITALS — BP 115/79 | HR 97 | Temp 98.6°F | Wt 262.8 lb

## 2019-01-18 DIAGNOSIS — O0990 Supervision of high risk pregnancy, unspecified, unspecified trimester: Secondary | ICD-10-CM | POA: Insufficient documentation

## 2019-01-18 DIAGNOSIS — Z3A11 11 weeks gestation of pregnancy: Secondary | ICD-10-CM

## 2019-01-18 DIAGNOSIS — Z1389 Encounter for screening for other disorder: Secondary | ICD-10-CM | POA: Diagnosis not present

## 2019-01-18 DIAGNOSIS — O09291 Supervision of pregnancy with other poor reproductive or obstetric history, first trimester: Secondary | ICD-10-CM

## 2019-01-18 DIAGNOSIS — Z124 Encounter for screening for malignant neoplasm of cervix: Secondary | ICD-10-CM

## 2019-01-18 DIAGNOSIS — O0991 Supervision of high risk pregnancy, unspecified, first trimester: Secondary | ICD-10-CM

## 2019-01-18 DIAGNOSIS — O09299 Supervision of pregnancy with other poor reproductive or obstetric history, unspecified trimester: Secondary | ICD-10-CM

## 2019-01-18 MED ORDER — ASPIRIN EC 81 MG PO TBEC: 81.0000 mg | DELAYED_RELEASE_TABLET | Freq: Every day | ORAL | 11 refills | Status: DC

## 2019-01-18 MED ORDER — PROMETHAZINE HCL 25 MG PO TABS: 12.5000 mg | ORAL_TABLET | Freq: Four times a day (QID) | ORAL | 11 refills | Status: DC | PRN

## 2019-01-18 NOTE — Progress Notes (Signed)
Subjective:   Kimberly [name] is a 28 y.o. G3P1011 at 105w1dby early ultrasound being seen today for her first obstetrical visit.  Her obstetrical history is significant for obesity and history of CHTN v GHTN with last pregnancy. Patient was induced at 39 weeks. . Patient does intend to breast feed. Pregnancy history fully reviewed.  Patient reports nausea. Taking phenergan and reports that this is helping with sx.   HISTORY: OB History  Gravida Para Term Preterm AB Living  '3 1 1 ' 0 1 1  SAB TAB Ectopic Multiple Live Births  1 0 0 0 1    # Outcome Date GA Lbr Len/2nd Weight Sex Delivery Anes PTL Lv  3 Current           2 Term 07/31/15 346w2d0:24 / 00:54 8 lb 2 oz (3.685 kg) M Vag-Spont EPI  LIV     Name: Osmon,BOY Larayah     Apgar1: 7  Apgar5: 9  1 SAB 07/2014 5w36w0d        Last pap smear was done 2017 and was normal  Past Medical History:  Diagnosis Date  . Anxiety   . Asthma    "grew out of it" (11/16/2012)  . Chronic back pain   . Complication of anesthesia    "takes a lot to put me to sleep; woke up completely mid endoscopy" (11/16/2012)  . Depression   . Dysrhythmia    ST (11/16/2012)  . Gastric reflux   . 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 Iron deficiency anemia   . Migraines    "weekly" (11/16/2012)  . Ovarian cyst   . Pregnancy induced hypertension   . [place]ia [place]  "moderate" (11/16/2012)  . Scoliosis    Past Surgical History:  Procedure Laterality Date  . LAPAROSCOPIC OVARIAN CYSTECTOMY Left 02/11/2016   Procedure: LAPAROSCOPIC OVARIAN CYSTECTOMY;  Surgeon: MyrEmily FilbertD;  Location[facility];  Service: Gynecology;  Laterality: Left;  . UPPER GI ENDOSCOPY  ~ 2006; ~2008   Family History  Problem Relation Age of Onset  . Diabetes Mother   . Hypertension Mother   . Vision loss Mother   . Hypertension Father   . Hyperlipidemia Father   . Thyroid disease Maternal Grandmother    Social History   Tobacco Use  . Smoking  status: Never Smoker  . Smokeless tobacco: Never Used  Substance Use Topics  . Alcohol use: Not Currently    Comment: very rare  . Drug use: No   Allergies  Allergen Reactions  . Cold & Cough Daytime [Acetaminophen-Dm] Shortness Of Breath    Could not tolerate- orange liquid, may have contained Pseudoephedrine- "I could not tolerate"  . Sulfa Antibiotics Swelling  . Zofran [Ondansetron] Other (See Comments)    Muscle Spasms  . Abilify [Aripiprazole] Other (See Comments)    Lethargy, unable to function  . Amoxicillin-Pot Clavulanate Other (See Comments)    Stomach pains Has patient had a PCN reaction causing immediate rash, facial/tongue/throat swelling, SOB or lightheadedness with hypotension: No Has patient had a PCN reaction causing severe rash involving mucus membranes or skin necrosis: No Has patient had a PCN reaction that required hospitalization No Has patient had a PCN reaction occurring within the last 10 years: Yes If all of the above answers are "NO", then may proceed with Cephalosporin use.   . Cephalexin Other (See Comments)    Stomach pains Pt taking Cefadroxil as outpatient  .  Ferrous Sulfate Nausea And Vomiting  . Latex Swelling and Other (See Comments)    Burning of skin, too  . Tape Other (See Comments)    Swelling, burning of skin .   Marland Kitchen Zicam Cold Remedy [Erysidoron #1] Nausea And Vomiting  . Keflex [Cephalexin] Rash and Other (See Comments)    Rash--able to take w/ Benadryl  . Vicodin [Hydrocodone-Acetaminophen] Hives and Rash    Per pt- makes her very sleepy, feels like she isn't getting her breath. States she does not have throat/tongue swelling or anaphylaxis.   Current Outpatient Medications on File Prior to Visit  Medication Sig Dispense Refill  . acetaminophen (TYLENOL) 500 MG tablet Take 1,000 mg by mouth every 8 (eight) hours as needed (for pain or headaches).    . Blood Pressure Monitoring (BLOOD PRESSURE MONITOR AUTOMAT) DEVI 1 Device by Does not  apply route daily. Automatic blood pressure cuff x-large-large size cuff. Blood pressure to be monitored regularly at home. ICD-10 code: O09.90. 1 each 0  . levothyroxine (SYNTHROID) 200 MCG tablet Take 1 tablet (200 mcg total) by mouth daily before breakfast. 30 tablet 6  . omeprazole (PRILOSEC) 20 MG capsule Take 1 capsule (20 mg total) by mouth daily. 30 capsule 6  . Prenatal Vit-Fe Fumarate-FA (PRENATAL VITAMIN PLUS LOW IRON) 27-1 MG TABS Take 1 tablet by mouth daily. (Patient not taking: Reported on 11/29/2018) 30 tablet 11  . Prenatal Vit-Fe Fumarate-FA (PRENATAL VITAMIN PO) Take 1 tablet by mouth daily.    . promethazine (PHENERGAN) 25 MG tablet Take 1 tablet (25 mg total) by mouth every 6 (six) hours as needed for nausea or vomiting. 30 tablet 1   No current facility-administered medications on file prior to visit.    Review of Systems Pertinent items noted in HPI and remainder of comprehensive ROS otherwise negative.  Exam   Vitals:   01/18/19 1324  BP: 115/79  Pulse: 97  Temp: 98.6 F (37 C)  Weight: 262 lb 12.8 oz (119.2 kg)     Physical Exam  Constitutional: She is oriented to person, place, and time and well-developed, well-nourished, and in no distress. No distress.  HENT:  Head: Normocephalic.  Cardiovascular: Normal rate.  Pulmonary/Chest: Effort normal.  Abdominal: Soft. There is no abdominal tenderness. There is no rebound.  Genitourinary:    Genitourinary Comments:  External: no lesion Vagina: small amount of white discharge Cervix: pink, smooth, no CMT Uterus: AGA    Neurological: She is alert and oriented to person, place, and time.  Skin: Skin is warm and dry.  Psychiatric: Affect normal.  Nursing note and vitals reviewed.    Pt informed that the ultrasound is considered a limited OB ultrasound and is not intended to be a complete ultrasound exam.  Patient also informed that the ultrasound is not being completed with the intent of assessing for fetal  or placental anomalies or any pelvic abnormalities.  Explained that the purpose of today's ultrasound is to assess for  viability.  Patient acknowledges the purpose of the exam and the limitations of the study.    +cardiac activity  Assessment:   Pregnancy: G3P1011 Patient Active Problem List   Diagnosis Date Noted  . Supervision of high risk pregnancy, antepartum 01/09/2019  . Chest pain 03/27/2018  . Degenerative disc disease at L5-S1 level 06/23/2016  . Chronic back pain 05/20/2016  . Anemia 01/16/2016  . Ovarian cyst 04/25/2015  . Rubella non-immune status, antepartum 03/11/2015  . Tachycardia 11/16/2012  . Hypothyroidism due to  defective thyroid hormonogenesis   . Goiter 03/01/2011  . Acanthosis nigricans, acquired 03/01/2011  . Oligomenorrhea 03/01/2011  . Hirsutism 03/01/2011  . PCOS (polycystic ovarian syndrome) 03/01/2011  . Hypertension 03/01/2011  . GERD (gastroesophageal reflux disease) 03/01/2011  . Anxiety and depression 03/01/2011  . Obesity 07/03/2010     Plan:  1. Supervision of high risk pregnancy, antepartum - HgB A1c - Obstetric Panel, Including HIV - ob urine culture - Cytology - PAP( [place]) - Cervicovaginal ancillary only( [place]) - Genetic Screening - TSH  2. Pap smear for cervical cancer screening - Cytology - PAP( [place])  3. Hx of preeclampsia, prior pregnancy, currently pregnant - Comp Met (CMET) - Protein / creatinine ratio, urine - Start baby ASA   4. Hypothyroidism - on synthroid - TSH q trimester    Initial labs drawn. Continue prenatal vitamins. Genetic Screening discussed, NIPS: requested. Ultrasound discussed; fetal anatomic survey: requested. Problem list reviewed and updated. The nature of [place] with multiple MDs and other Advanced Practice Providers was explained to patient; also emphasized that residents, students are part of our team. Routine obstetric precautions  reviewed. 50% of 45 min visit spent in counseling and coordination of care. No follow-ups on file. ]

## 2019-01-18 NOTE — Telephone Encounter (Signed)
Message sent to Lynnea Ferrier, LCSW to contact patient in regards to scoring high on PHQ-9 form.

## 2019-01-19 ENCOUNTER — Encounter: Payer: Self-pay | Admitting: Family Medicine

## 2019-01-19 LAB — OBSTETRIC PANEL, INCLUDING HIV
Antibody Screen: NEGATIVE
Basophils Absolute: 0 x10E3/uL (ref 0.0–0.2)
Basos: 0 %
EOS (ABSOLUTE): 0.1 x10E3/uL (ref 0.0–0.4)
Eos: 1 %
HIV Screen 4th Generation wRfx: NONREACTIVE
Hematocrit: 33.5 % — ABNORMAL LOW (ref 34.0–46.6)
Hemoglobin: 10.8 g/dL — ABNORMAL LOW (ref 11.1–15.9)
Hepatitis B Surface Ag: NEGATIVE
Immature Grans (Abs): 0 x10E3/uL (ref 0.0–0.1)
Immature Granulocytes: 0 %
Lymphocytes Absolute: 2.5 x10E3/uL (ref 0.7–3.1)
Lymphs: 24 %
MCH: 25.2 pg — ABNORMAL LOW (ref 26.6–33.0)
MCHC: 32.2 g/dL (ref 31.5–35.7)
MCV: 78 fL — ABNORMAL LOW (ref 79–97)
Monocytes Absolute: 0.6 x10E3/uL (ref 0.1–0.9)
Monocytes: 6 %
Neutrophils Absolute: 7.3 x10E3/uL — ABNORMAL HIGH (ref 1.4–7.0)
Neutrophils: 69 %
Platelets: 371 x10E3/uL (ref 150–450)
RBC: 4.29 x10E6/uL (ref 3.77–5.28)
RDW: 15.4 % (ref 11.7–15.4)
RPR Ser Ql: NONREACTIVE
Rh Factor: POSITIVE
Rubella Antibodies, IGG: <0.9 {index} — ABNORMAL LOW
WBC: 10.5 x10E3/uL (ref 3.4–10.8)

## 2019-01-19 LAB — TSH: TSH: 2.99 u[IU]/mL (ref 0.450–4.500)

## 2019-01-19 LAB — COMPREHENSIVE METABOLIC PANEL
ALT: 9 IU/L (ref 0–32)
AST: 9 IU/L (ref 0–40)
Albumin/Globulin Ratio: 1.5 (ref 1.2–2.2)
Albumin: 3.8 g/dL — ABNORMAL LOW (ref 3.9–5.0)
Alkaline Phosphatase: 87 IU/L (ref 39–117)
BUN/Creatinine Ratio: 11 (ref 9–23)
BUN: 5 mg/dL — ABNORMAL LOW (ref 6–20)
Bilirubin Total: 0.3 mg/dL (ref 0.0–1.2)
CO2: 19 mmol/L — ABNORMAL LOW (ref 20–29)
Calcium: 9.1 mg/dL (ref 8.7–10.2)
Chloride: 102 mmol/L (ref 96–106)
Creatinine, Ser: 0.45 mg/dL — ABNORMAL LOW (ref 0.57–1.00)
GFR calc Af Amer: 159 mL/min/{1.73_m2} (ref >59)
GFR calc non Af Amer: 138 mL/min/{1.73_m2} (ref >59)
Globulin, Total: 2.6 g/dL (ref 1.5–4.5)
Glucose: 104 mg/dL — ABNORMAL HIGH (ref 65–99)
Potassium: 3.6 mmol/L (ref 3.5–5.2)
Sodium: 138 mmol/L (ref 134–144)
Total Protein: 6.4 g/dL (ref 6.0–8.5)

## 2019-01-19 LAB — CERVICOVAGINAL ANCILLARY ONLY
Bacterial Vaginitis (gardnerella): NEGATIVE
Candida Glabrata: NEGATIVE
Candida Vaginitis: NEGATIVE
Chlamydia: NEGATIVE
Comment: NEGATIVE
Comment: NEGATIVE
Comment: NEGATIVE
Comment: NEGATIVE
Comment: NEGATIVE
Comment: NORMAL
Neisseria Gonorrhea: NEGATIVE
Trichomonas: NEGATIVE

## 2019-01-19 LAB — PROTEIN / CREATININE RATIO, URINE
Creatinine, Urine: 233.8 mg/dL
Protein, Ur: 20.3 mg/dL
Protein/Creat Ratio: 87 mg/g creat (ref 0–200)

## 2019-01-19 LAB — HEMOGLOBIN A1C
Est. average glucose Bld gHb Est-mCnc: 91 mg/dL
Hgb A1c MFr Bld: 4.8 % (ref 4.8–5.6)

## 2019-01-20 LAB — CULTURE, OB URINE

## 2019-01-20 LAB — URINE CULTURE, OB REFLEX

## 2019-01-23 LAB — CYTOLOGY - PAP: Diagnosis: NEGATIVE

## 2019-01-26 ENCOUNTER — Encounter: Payer: Self-pay | Admitting: General Practice

## 2019-01-31 ENCOUNTER — Encounter: Payer: Self-pay | Admitting: Family Medicine

## 2019-01-31 ENCOUNTER — Encounter: Payer: Self-pay | Admitting: General Practice

## 2019-01-31 ENCOUNTER — Inpatient Hospital Stay (HOSPITAL_COMMUNITY)
Admission: AD | Admit: 2019-01-31 | Discharge: 2019-01-31 | Disposition: A | Discharge status: Home / Self Care | Payer: Medicaid Other | Attending: Obstetrics and Gynecology | Admitting: Obstetrics and Gynecology

## 2019-01-31 ENCOUNTER — Other Ambulatory Visit: Payer: Self-pay

## 2019-01-31 ENCOUNTER — Encounter (HOSPITAL_COMMUNITY): Payer: Self-pay | Admitting: Obstetrics and Gynecology

## 2019-01-31 ENCOUNTER — Telehealth: Payer: Self-pay

## 2019-01-31 DIAGNOSIS — O99891 Other specified diseases and conditions complicating pregnancy: Secondary | ICD-10-CM | POA: Diagnosis present

## 2019-01-31 DIAGNOSIS — O0990 Supervision of high risk pregnancy, unspecified, unspecified trimester: Secondary | ICD-10-CM

## 2019-01-31 DIAGNOSIS — Z3A13 13 weeks gestation of pregnancy: Secondary | ICD-10-CM

## 2019-01-31 DIAGNOSIS — O0991 Supervision of high risk pregnancy, unspecified, first trimester: Secondary | ICD-10-CM | POA: Diagnosis not present

## 2019-01-31 DIAGNOSIS — R42 Dizziness and giddiness: Secondary | ICD-10-CM

## 2019-01-31 LAB — URINALYSIS, ROUTINE W REFLEX MICROSCOPIC
Bilirubin Urine: NEGATIVE
Glucose, UA: NEGATIVE mg/dL
Hgb urine dipstick: NEGATIVE
Ketones, ur: NEGATIVE mg/dL
Nitrite: NEGATIVE
Protein, ur: NEGATIVE mg/dL
Specific Gravity, Urine: 1.027 (ref 1.005–1.030)
pH: 5 (ref 5.0–8.0)

## 2019-01-31 LAB — CBC
HCT: 35.9 % — ABNORMAL LOW (ref 36.0–46.0)
Hemoglobin: 11.2 g/dL — ABNORMAL LOW (ref 12.0–15.0)
MCH: 25.4 pg — ABNORMAL LOW (ref 26.0–34.0)
MCHC: 31.2 g/dL (ref 30.0–36.0)
MCV: 81.4 fL (ref 80.0–100.0)
Platelets: 343 10*3/uL (ref 150–400)
RBC: 4.41 MIL/uL (ref 3.87–5.11)
RDW: 15.5 % (ref 11.5–15.5)
WBC: 11.1 10*3/uL — ABNORMAL HIGH (ref 4.0–10.5)
nRBC: 0 % (ref 0.0–0.2)

## 2019-01-31 MED ORDER — MECLIZINE HCL 25 MG PO TABS
25.0000 mg | ORAL_TABLET | Freq: Once | ORAL | Status: AC
  Administered 2019-01-31: 25 mg via ORAL
  Filled 2019-01-31: qty 1

## 2019-01-31 MED ORDER — MECLIZINE HCL 25 MG PO TABS: 25.0000 mg | ORAL_TABLET | Freq: Two times a day (BID) | ORAL | 1 refills | Status: DC | PRN

## 2019-01-31 NOTE — Telephone Encounter (Signed)
Recommend she call her OB ASAP today

## 2019-01-31 NOTE — Telephone Encounter (Signed)
Spoke with patient who reports that she is [redacted] weeks pregnant and has been experiencing dizziness for about a week now. She reports that since yesterday it has gotten much worse and she cannot bend over without feeling like she is going to pass out. She was nauseous this morning but denies it currently. She reports a decrease in appetite but she has been drinking plenty of water and snacking on crackers throughout the day. She denies any shortness of breath, chest pain, or headache. States that when she lies down the room spins and she feels like she might pass out. She reports that she had pregnancy induced hypertension during her first pregnancy but did not have any symptoms like this during that time. She does not have a way to take her blood pressure. She denies any palpitations or increased heart rate. I advised her that if these symptoms continue she may need to go to the ER. I advised her that I would reach out to Dr. Radford Pax for any recommendations but that she should also call her OBGYN/PCP.

## 2019-01-31 NOTE — MAU Provider Note (Signed)
History     CSN: [id number]  Arrival date and time: 01/31/19 1421   First Provider Initiated Contact with Patient 01/31/19 1540      Chief Complaint  Patient presents with   Dizziness   HPI  Kimberly [name] is a 29 y.o. year old G99P1011 female at [redacted]w[redacted]d weeks gestation who presents to MAU reporting dizziness for 3 days. She reports that the dizziness is more at night. She states that when rolling over in bed or just moving around in bed. She states that it takes about 5 mins for the dizziness to pass. She has been dizzy like this for the past 3 days, but it was constant today and has not resolved. She denies N/V. She denies H/A or tinnitis. She reported having an intermittent H/A 4-5 days ago, but none today.  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    Iron deficiency anemia    Migraines    "weekly" (11/16/2012)   Ovarian cyst    Pregnancy induced hypertension    Schizophrenia ([place])    "moderate" (11/16/2012)   Scoliosis     Past Surgical History:  Procedure Laterality Date   LAPAROSCOPIC OVARIAN CYSTECTOMY Left 02/11/2016   Procedure: LAPAROSCOPIC OVARIAN CYSTECTOMY;  Surgeon: Emily Filbert, MD;  Location: [facility];  Service: Gynecology;  Laterality: Left;   UPPER GI ENDOSCOPY  ~ 2006; ~2008    Family History  Problem Relation Age of Onset   Diabetes Mother    Hypertension Mother    Vision loss Mother    Hypertension Father    Hyperlipidemia Father    Thyroid disease Maternal Grandmother     Social History   Tobacco Use   Smoking status: Never Smoker   Smokeless tobacco: Never Used  Substance Use Topics   Alcohol use: Not Currently    Comment: very rare    Drug use: No    Allergies:  Allergies  Allergen Reactions   Cold & Cough Daytime [Acetaminophen-Dm] Shortness Of Breath    Could not tolerate- orange liquid, may have contained Pseudoephedrine- "I could not tolerate"   Sulfa Antibiotics Swelling   Zofran [Ondansetron] Other (See Comments)    Muscle Spasms   Abilify [Aripiprazole] Other (See Comments)    Lethargy, unable to function   Amoxicillin-Pot Clavulanate Other (See Comments)    Stomach pains Has patient had a PCN reaction causing immediate rash, facial/tongue/throat swelling, SOB or lightheadedness with hypotension: No Has patient had a PCN reaction causing severe rash involving mucus membranes or skin necrosis: No Has patient had a PCN reaction that required hospitalization No Has patient had a PCN reaction occurring within the last 10 years: Yes If all of the above answers are "NO", then may proceed with Cephalosporin use.    Cephalexin Other (See Comments)    Stomach pains Pt taking Cefadroxil as outpatient   Ferrous Sulfate Nausea And Vomiting   Latex Swelling and Other (See Comments)    Burning of skin, too   Tape Other (See Comments)    Swelling, burning of skin .    Zicam Cold Remedy [  Erysidoron #1] Nausea And Vomiting   Keflex [Cephalexin] Rash and Other (See Comments)    Rash--able to take w/ Benadryl   Vicodin [Hydrocodone-Acetaminophen] Hives and Rash    Per pt- makes her very sleepy, feels like she isn't getting her breath. States she does not have throat/tongue swelling or anaphylaxis.    Medications Prior to Admission  Medication Sig Dispense Refill Last Dose   Blood Pressure Monitoring (BLOOD PRESSURE MONITOR AUTOMAT) DEVI 1 Device by Does not apply route daily. Automatic blood pressure cuff x-large-large size cuff. Blood pressure to be monitored regularly at home. ICD-10 code: O09.90. 1 each 0 01/31/2019 at Unknown time   levothyroxine (SYNTHROID) 200 MCG tablet Take 1 tablet (200 mcg total) by mouth  daily before breakfast. 30 tablet 6 01/31/2019 at Unknown time   omeprazole (PRILOSEC) 20 MG capsule Take 1 capsule (20 mg total) by mouth daily. 30 capsule 6 01/31/2019 at Unknown time   Prenatal Vit-Fe Fumarate-FA (PRENATAL VITAMIN PLUS LOW IRON) 27-1 MG TABS Take 1 tablet by mouth daily. 30 tablet 11 01/31/2019 at Unknown time   promethazine (PHENERGAN) 25 MG tablet Take 0.5-1 tablets (12.5-25 mg total) by mouth every 6 (six) hours as needed for nausea or vomiting. 30 tablet 11 Past Week at Unknown time   acetaminophen (TYLENOL) 500 MG tablet Take 1,000 mg by mouth every 8 (eight) hours as needed (for pain or headaches).      aspirin EC 81 MG tablet Take 1 tablet (81 mg total) by mouth daily. 30 tablet 11 not started yet   Prenatal Vit-Fe Fumarate-FA (PRENATAL VITAMIN PO) Take 1 tablet by mouth daily.       Review of Systems  Constitutional: Negative.   HENT: Negative.   Eyes: Negative.   Respiratory: Negative.   Cardiovascular: Negative.   Gastrointestinal: Negative.   Endocrine: Negative.   Genitourinary: Negative.   Musculoskeletal: Negative.   Skin: Negative.   Allergic/Immunologic: Negative.   Neurological: Positive for dizziness and light-headedness. Negative for syncope and headaches (had one 4-5 days ago).  Hematological: Negative.   Psychiatric/Behavioral: Negative.    Physical Exam   Patient Vitals for the past 24 hrs:  BP Temp Temp src Pulse Resp SpO2 Height Weight  01/31/19 1452 130/83 -- -- (!) 101 -- -- -- --  01/31/19 1435 134/88 98.5 F (36.9 C) Oral (!) 102 18 99 % 5\' 4"  (1.626 m) 117.8 kg   Orthostatic VS for the past 24 hrs (Last 3 readings):  BP- Lying Pulse- Lying BP- Sitting Pulse- Sitting BP- Standing at 0 minutes Pulse- Standing at 0 minutes BP- Standing at 3 minutes Pulse- Standing at 3 minutes  01/31/19 1549 118/74 90 117/73 96 110/71 107 123/86 122    Physical Exam  Nursing note and vitals reviewed. Constitutional: She is oriented to person, place,  and time. She appears well-developed and well-nourished.  HENT:  Head: Normocephalic and atraumatic.  Eyes: Pupils are equal, round, and reactive to light.  Cardiovascular: Normal rate.  Respiratory: Effort normal.  GI: Soft.  Genitourinary:    Genitourinary Comments: Not indicated   Musculoskeletal:        General: Normal range of motion.     Cervical back: Normal range of motion.  Neurological: She is alert and oriented to person, place, and time.  Skin: Skin is warm and dry.  Psychiatric: She has a normal mood and affect. Her behavior is normal. Judgment and thought content normal.   FHTs by doppler: 165 bpm   MAU  Course  Procedures  MDM CCUA CBC EKG - NSR Orthostatic VS - not significant change in VS with changing positions Meclizine 25 mg -- sx's improving  Results for orders placed or performed during the hospital encounter of 01/31/19 (from the past 24 hour(s))  Urinalysis, Routine w reflex microscopic     Status: Abnormal   Collection Time: 01/31/19  2:43 PM  Result Value Ref Range   Color, Urine AMBER (A) YELLOW   APPearance TURBID (A) CLEAR   Specific Gravity, Urine 1.027 1.005 - 1.030   pH 5.0 5.0 - 8.0   Glucose, UA NEGATIVE NEGATIVE mg/dL   Hgb urine dipstick NEGATIVE NEGATIVE   Bilirubin Urine NEGATIVE NEGATIVE   Ketones, ur NEGATIVE NEGATIVE mg/dL   Protein, ur NEGATIVE NEGATIVE mg/dL   Nitrite NEGATIVE NEGATIVE   Leukocytes,Ua SMALL (A) NEGATIVE   WBC, UA 11-20 0 - 5 WBC/hpf   Bacteria, UA RARE (A) NONE SEEN   Squamous Epithelial / LPF 11-20 0 - 5   Mucus PRESENT    Budding Yeast PRESENT   CBC     Status: Abnormal   Collection Time: 01/31/19  3:47 PM  Result Value Ref Range   WBC 11.1 (H) 4.0 - 10.5 K/uL   RBC 4.41 3.87 - 5.11 MIL/uL   Hemoglobin 11.2 (L) 12.0 - 15.0 g/dL   HCT 35.9 (L) 36.0 - 46.0 %   MCV 81.4 80.0 - 100.0 fL   MCH 25.4 (L) 26.0 - 34.0 pg   MCHC 31.2 30.0 - 36.0 g/dL   RDW 15.5 11.5 - 15.5 %   Platelets 343 150 - 400 K/uL    nRBC 0.0 0.0 - 0.2 %    Media Information   Document Information  Photos    01/31/2019 16:27  Attached To:  Hospital Encounter on 01/31/19  Source Information  Laury Deep, CNM  [facility] Assess    Assessment and Plan  Vertigo - Plan: Discharge patient - Patient reports she has a strong family hx of vertigo, including her mother - Rx for Meclizine 25 mg BID - Information provided on vertigo and how to perform the Epley maneuver - Advised to eat meal/snack every 2-3 hours and drink at least 8-10 bottles  of water everyday  - Discharge home - Patient verbalized an understanding of the plan of care and agrees.   Laury Deep, MSN, CNM 01/31/2019, 3:48 PM

## 2019-01-31 NOTE — Discharge Instructions (Signed)
Please make sure you are eating a meal/snack every 2-3 hours and drink at least 8-10 bottles of water daily.

## 2019-01-31 NOTE — Telephone Encounter (Signed)
Left message to call back regarding MyChart message.

## 2019-01-31 NOTE — Telephone Encounter (Signed)
[email or web address] regarding her My Chart message. 1/12

## 2019-01-31 NOTE — MAU Note (Signed)
Past wk has been kind of dizzy, more so at night.  Usually goes away.   Last night was worse, she was laying down and every time she move or roll over, it took 5 minutes for dizziness to pass.  Not throwing up, just don't feel right.

## 2019-02-03 ENCOUNTER — Encounter: Payer: Self-pay | Admitting: Advanced Practice Midwife

## 2019-02-15 ENCOUNTER — Encounter: Payer: Self-pay | Admitting: Advanced Practice Midwife

## 2019-02-15 ENCOUNTER — Telehealth (INDEPENDENT_AMBULATORY_CARE_PROVIDER_SITE_OTHER): Payer: Medicaid Other | Admitting: Advanced Practice Midwife

## 2019-02-15 VITALS — BP 137/92 | HR 115

## 2019-02-15 DIAGNOSIS — O0990 Supervision of high risk pregnancy, unspecified, unspecified trimester: Secondary | ICD-10-CM

## 2019-02-15 DIAGNOSIS — R42 Dizziness and giddiness: Secondary | ICD-10-CM

## 2019-02-15 DIAGNOSIS — N83201 Unspecified ovarian cyst, right side: Secondary | ICD-10-CM

## 2019-02-15 DIAGNOSIS — O0992 Supervision of high risk pregnancy, unspecified, second trimester: Secondary | ICD-10-CM

## 2019-02-15 DIAGNOSIS — Z3A15 15 weeks gestation of pregnancy: Secondary | ICD-10-CM

## 2019-02-15 MED ORDER — GOJJI WEIGHT SCALE MISC: 1.0000 | Freq: Every day | 0 refills | Status: DC | PRN

## 2019-02-15 NOTE — Progress Notes (Signed)
   TELEHEALTH VIRTUAL OBSTETRICS VISIT ENCOUNTER NOTE  I connected with Kimberly [name] on 02/15/19 at  2:50 PM EST by telephone at home and verified that I am speaking with the correct person using two identifiers.   I discussed the limitations, risks, security and privacy concerns of performing an evaluation and management service by telephone and the availability of in person appointments. I also discussed with the patient that there may be a patient responsible charge related to this service. The patient expressed understanding and agreed to proceed.  Subjective:  Kimberly [name] is a 29 y.o. G3P1011 at [redacted]w[redacted]d being followed for ongoing prenatal care.  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  Patient reports lower abdominal pain and pressure that is constant. . Reports fetal movement. Denies any contractions, bleeding or leaking of fluid.   The following portions of the patient's history were reviewed and updated as appropriate: allergies, current medications, past family history, past medical history, past social history, past surgical history and problem list.   Objective:   General:  Alert, oriented and cooperative.   Mental Status: Normal mood and affect perceived. Normal judgment and thought content.  Rest of physical exam deferred due to type of encounter  Assessment and Plan:  Pregnancy: G3P1011 at [redacted]w[redacted]d 1. Vertigo  2. Supervision of high risk pregnancy, antepartum - Misc. Devices (GOJJI WEIGHT SCALE) MISC; 1 Device by Does not apply route  daily as needed. To weight self daily as needed at home. ICD-10 code: O43.90  Dispense: 1 each; Refill: 0 - [place] MFM OB LIMITED; Future - Patient planning to go to part time at work, and needs a note from [place]. Will send note and FMLA paperwork   3. Cyst of right ovary - US MFM OB LIMITED; Future  Preterm labor symptoms and general obstetric precautions including but not limited to vaginal bleeding, contractions, leaking of fluid and fetal movement were reviewed in detail with the patient.  I discussed the assessment and treatment plan with the patient. The patient was provided an opportunity to ask questions and all were answered. The patient agreed with the plan and demonstrated an understanding of the instructions. The patient was advised to call back or seek an in-person office evaluation/go to MAU at [facility] for any urgent or concerning symptoms. Please refer to After Visit Summary for other counseling recommendations.   I provided 15 minutes of non-face-to-face time during this encounter.  Return in about 4 weeks (around 03/15/2019) for virtual visit .  Future Appointments  Date Time Provider [place]  03/14/2019  1:30 PM WH-MFC [place] 5 WH-MFCUS MFC-US     Cayleigh Paull DNP, CNM  02/15/19  2:58 PM  Center for [facility], [facility]

## 2019-02-24 ENCOUNTER — Ambulatory Visit (HOSPITAL_COMMUNITY)
Admission: RE | Admit: 2019-02-24 | Discharge: 2019-02-24 | Disposition: A | Discharge status: Home / Self Care | Payer: Medicaid Other | Source: Ambulatory Visit | Attending: Obstetrics and Gynecology | Admitting: Obstetrics and Gynecology

## 2019-02-24 ENCOUNTER — Encounter (HOSPITAL_COMMUNITY): Payer: Self-pay

## 2019-02-24 ENCOUNTER — Encounter: Payer: Self-pay | Admitting: Advanced Practice Midwife

## 2019-02-24 ENCOUNTER — Ambulatory Visit (HOSPITAL_COMMUNITY): Payer: Medicaid Other | Admitting: *Deleted

## 2019-02-24 ENCOUNTER — Other Ambulatory Visit: Payer: Self-pay

## 2019-02-24 DIAGNOSIS — O0990 Supervision of high risk pregnancy, unspecified, unspecified trimester: Secondary | ICD-10-CM

## 2019-02-24 DIAGNOSIS — O99282 Endocrine, nutritional and metabolic diseases complicating pregnancy, second trimester: Secondary | ICD-10-CM

## 2019-02-24 DIAGNOSIS — N83201 Unspecified ovarian cyst, right side: Secondary | ICD-10-CM

## 2019-02-24 DIAGNOSIS — Z3A16 16 weeks gestation of pregnancy: Secondary | ICD-10-CM

## 2019-02-24 DIAGNOSIS — O10012 Pre-existing essential hypertension complicating pregnancy, second trimester: Secondary | ICD-10-CM

## 2019-02-24 DIAGNOSIS — R42 Dizziness and giddiness: Secondary | ICD-10-CM

## 2019-02-24 DIAGNOSIS — E669 Obesity, unspecified: Secondary | ICD-10-CM

## 2019-02-24 DIAGNOSIS — O3482 Maternal care for other abnormalities of pelvic organs, second trimester: Secondary | ICD-10-CM | POA: Diagnosis not present

## 2019-02-24 DIAGNOSIS — E039 Hypothyroidism, unspecified: Secondary | ICD-10-CM | POA: Diagnosis not present

## 2019-02-24 DIAGNOSIS — O99212 Obesity complicating pregnancy, second trimester: Secondary | ICD-10-CM

## 2019-03-01 ENCOUNTER — Telehealth: Payer: Self-pay

## 2019-03-01 ENCOUNTER — Encounter: Payer: Self-pay | Admitting: Family Medicine

## 2019-03-01 DIAGNOSIS — E039 Hypothyroidism, unspecified: Secondary | ICD-10-CM

## 2019-03-01 DIAGNOSIS — K219 Gastro-esophageal reflux disease without esophagitis: Secondary | ICD-10-CM

## 2019-03-01 MED ORDER — LEVOTHYROXINE SODIUM 200 MCG PO TABS: 200.0000 ug | ORAL_TABLET | Freq: Every day | ORAL | 1 refills | Status: DC

## 2019-03-01 MED ORDER — OMEPRAZOLE 20 MG PO CPDR: 20.0000 mg | DELAYED_RELEASE_CAPSULE | Freq: Every day | ORAL | 1 refills | Status: DC

## 2019-03-14 ENCOUNTER — Other Ambulatory Visit: Payer: Self-pay

## 2019-03-14 ENCOUNTER — Other Ambulatory Visit (HOSPITAL_COMMUNITY): Payer: Self-pay | Admitting: *Deleted

## 2019-03-14 ENCOUNTER — Other Ambulatory Visit: Payer: Self-pay | Admitting: Advanced Practice Midwife

## 2019-03-14 ENCOUNTER — Ambulatory Visit (HOSPITAL_COMMUNITY)
Admission: RE | Admit: 2019-03-14 | Discharge: 2019-03-14 | Disposition: A | Discharge status: Home / Self Care | Payer: Medicaid Other | Source: Ambulatory Visit | Attending: Advanced Practice Midwife | Admitting: Advanced Practice Midwife

## 2019-03-14 DIAGNOSIS — O0992 Supervision of high risk pregnancy, unspecified, second trimester: Secondary | ICD-10-CM

## 2019-03-14 DIAGNOSIS — O0990 Supervision of high risk pregnancy, unspecified, unspecified trimester: Secondary | ICD-10-CM

## 2019-03-14 DIAGNOSIS — O09292 Supervision of pregnancy with other poor reproductive or obstetric history, second trimester: Secondary | ICD-10-CM | POA: Diagnosis not present

## 2019-03-14 DIAGNOSIS — O09299 Supervision of pregnancy with other poor reproductive or obstetric history, unspecified trimester: Secondary | ICD-10-CM

## 2019-03-14 DIAGNOSIS — Z3A19 19 weeks gestation of pregnancy: Secondary | ICD-10-CM

## 2019-03-14 DIAGNOSIS — Z362 Encounter for other antenatal screening follow-up: Secondary | ICD-10-CM

## 2019-03-15 ENCOUNTER — Encounter: Payer: Self-pay | Admitting: Advanced Practice Midwife

## 2019-03-16 ENCOUNTER — Telehealth (INDEPENDENT_AMBULATORY_CARE_PROVIDER_SITE_OTHER): Payer: Medicaid Other | Admitting: Obstetrics and Gynecology

## 2019-03-16 ENCOUNTER — Encounter: Payer: Self-pay | Admitting: Obstetrics and Gynecology

## 2019-03-16 ENCOUNTER — Other Ambulatory Visit: Payer: Self-pay

## 2019-03-16 DIAGNOSIS — O0992 Supervision of high risk pregnancy, unspecified, second trimester: Secondary | ICD-10-CM

## 2019-03-16 DIAGNOSIS — Z3A19 19 weeks gestation of pregnancy: Secondary | ICD-10-CM | POA: Diagnosis not present

## 2019-03-16 DIAGNOSIS — O0990 Supervision of high risk pregnancy, unspecified, unspecified trimester: Secondary | ICD-10-CM

## 2019-03-16 NOTE — Progress Notes (Signed)
   TELEHEALTH VIRTUAL OBSTETRICS VISIT ENCOUNTER NOTE  I connected with Kimberly [name] on 03/16/19 at  2:50 PM EST by telephone while running errands and verified that I am speaking with the correct person using two identifiers.   I discussed the limitations, risks, security and privacy concerns of performing an evaluation and management service by telephone and the availability of in person appointments. I also discussed with the patient that there may be a patient responsible charge related to this service. The patient expressed understanding and agreed to proceed.  Subjective:  Kimberly [name] is a 29 y.o. G3P1011 at 100w2d being followed for ongoing prenatal care.  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  Patient reports no complaints. Reports fetal movement. Denies any contractions, bleeding or leaking of fluid.   The following portions of the patient's history were reviewed and updated as appropriate: allergies, current medications, past family history, past medical history, past social history, past surgical history and problem list.   Objective:   General:  Alert, oriented and cooperative.   Mental Status: Normal mood and affect perceived. Normal judgment and thought content.  Rest of physical exam deferred due to type of encounter  Assessment and Plan:  Pregnancy: G3P1011 at [redacted]w[redacted]d 1. Supervision of high risk pregnancy, antepartum - Discussed optimized schedule for OB visit at 24 wks via My Chart - Advised to message office with VS when she  gets home today    Preterm labor symptoms and general obstetric precautions including but not limited to vaginal bleeding, contractions, leaking of fluid and fetal movement were reviewed in detail with the patient.  I discussed the assessment and treatment plan with the patient. The patient was provided an opportunity to ask questions and all were answered. The patient agreed with the plan and demonstrated an understanding of the instructions. The patient was advised to call back or seek an in-person office evaluation/go to MAU at [facility] for any urgent or concerning symptoms. Please refer to After Visit Summary for other counseling recommendations.   I provided 5 minutes of non-face-to-face time during this encounter. There was 5 minutes of chart review time spent prior to this encounter. Total time spent = 10 minutes.   Return in about 5 weeks (around 04/20/2019) for Return OB - My Chart video.  Future Appointments  Date Time Provider [place]  04/12/2019  9:30 AM [place] MFC-US  04/12/2019  9:30 AM WH-MFC [place] 1 WH-MFCUS MFC-US    Laury Deep, [place] for [employer], [place]

## 2019-04-05 ENCOUNTER — Encounter: Payer: Self-pay | Admitting: Advanced Practice Midwife

## 2019-04-05 ENCOUNTER — Other Ambulatory Visit: Payer: Self-pay | Admitting: *Deleted

## 2019-04-05 DIAGNOSIS — R42 Dizziness and giddiness: Secondary | ICD-10-CM

## 2019-04-05 MED ORDER — MECLIZINE HCL 25 MG PO TABS: 25.0000 mg | ORAL_TABLET | Freq: Two times a day (BID) | ORAL | 1 refills | Status: DC | PRN

## 2019-04-06 NOTE — Telephone Encounter (Signed)
Error

## 2019-04-12 ENCOUNTER — Ambulatory Visit (HOSPITAL_COMMUNITY): Payer: Medicaid Other | Admitting: *Deleted

## 2019-04-12 ENCOUNTER — Ambulatory Visit (HOSPITAL_COMMUNITY)
Admission: RE | Admit: 2019-04-12 | Discharge: 2019-04-12 | Disposition: A | Discharge status: Home / Self Care | Payer: Medicaid Other | Source: Ambulatory Visit | Attending: Obstetrics and Gynecology | Admitting: Obstetrics and Gynecology

## 2019-04-12 ENCOUNTER — Encounter (HOSPITAL_COMMUNITY): Payer: Self-pay

## 2019-04-12 ENCOUNTER — Other Ambulatory Visit (HOSPITAL_COMMUNITY): Payer: Self-pay | Admitting: *Deleted

## 2019-04-12 ENCOUNTER — Other Ambulatory Visit: Payer: Self-pay

## 2019-04-12 DIAGNOSIS — O3482 Maternal care for other abnormalities of pelvic organs, second trimester: Secondary | ICD-10-CM

## 2019-04-12 DIAGNOSIS — O99212 Obesity complicating pregnancy, second trimester: Secondary | ICD-10-CM | POA: Diagnosis not present

## 2019-04-12 DIAGNOSIS — O09299 Supervision of pregnancy with other poor reproductive or obstetric history, unspecified trimester: Secondary | ICD-10-CM | POA: Diagnosis not present

## 2019-04-12 DIAGNOSIS — R42 Dizziness and giddiness: Secondary | ICD-10-CM | POA: Diagnosis present

## 2019-04-12 DIAGNOSIS — O0990 Supervision of high risk pregnancy, unspecified, unspecified trimester: Secondary | ICD-10-CM | POA: Insufficient documentation

## 2019-04-12 DIAGNOSIS — Z3A23 23 weeks gestation of pregnancy: Secondary | ICD-10-CM

## 2019-04-12 DIAGNOSIS — O99282 Endocrine, nutritional and metabolic diseases complicating pregnancy, second trimester: Secondary | ICD-10-CM

## 2019-04-12 DIAGNOSIS — E079 Disorder of thyroid, unspecified: Secondary | ICD-10-CM

## 2019-04-12 DIAGNOSIS — O09292 Supervision of pregnancy with other poor reproductive or obstetric history, second trimester: Secondary | ICD-10-CM

## 2019-04-12 DIAGNOSIS — O3480 Maternal care for other abnormalities of pelvic organs, unspecified trimester: Secondary | ICD-10-CM

## 2019-04-12 DIAGNOSIS — O99280 Endocrine, nutritional and metabolic diseases complicating pregnancy, unspecified trimester: Secondary | ICD-10-CM

## 2019-04-12 DIAGNOSIS — Z362 Encounter for other antenatal screening follow-up: Secondary | ICD-10-CM | POA: Diagnosis not present

## 2019-04-12 DIAGNOSIS — O99210 Obesity complicating pregnancy, unspecified trimester: Secondary | ICD-10-CM

## 2019-04-12 DIAGNOSIS — O3660X0 Maternal care for excessive fetal growth, unspecified trimester, not applicable or unspecified: Secondary | ICD-10-CM | POA: Diagnosis not present

## 2019-04-12 DIAGNOSIS — N83209 Unspecified ovarian cyst, unspecified side: Secondary | ICD-10-CM

## 2019-04-12 DIAGNOSIS — E039 Hypothyroidism, unspecified: Secondary | ICD-10-CM

## 2019-04-20 ENCOUNTER — Telehealth: Payer: Medicaid Other | Admitting: Obstetrics and Gynecology

## 2019-05-04 ENCOUNTER — Encounter: Payer: Self-pay | Admitting: Advanced Practice Midwife

## 2019-05-05 ENCOUNTER — Encounter: Payer: Medicaid Other | Admitting: Advanced Practice Midwife

## 2019-05-10 ENCOUNTER — Other Ambulatory Visit: Payer: Self-pay

## 2019-05-10 ENCOUNTER — Ambulatory Visit (HOSPITAL_COMMUNITY): Payer: Medicaid Other | Admitting: *Deleted

## 2019-05-10 ENCOUNTER — Encounter (HOSPITAL_COMMUNITY): Payer: Self-pay

## 2019-05-10 ENCOUNTER — Other Ambulatory Visit (HOSPITAL_COMMUNITY): Payer: Self-pay | Admitting: *Deleted

## 2019-05-10 ENCOUNTER — Ambulatory Visit (HOSPITAL_COMMUNITY)
Admission: RE | Admit: 2019-05-10 | Discharge: 2019-05-10 | Disposition: A | Discharge status: Home / Self Care | Payer: Medicaid Other | Source: Ambulatory Visit | Attending: Obstetrics and Gynecology | Admitting: Obstetrics and Gynecology

## 2019-05-10 DIAGNOSIS — R42 Dizziness and giddiness: Secondary | ICD-10-CM | POA: Diagnosis present

## 2019-05-10 DIAGNOSIS — E039 Hypothyroidism, unspecified: Secondary | ICD-10-CM

## 2019-05-10 DIAGNOSIS — O99282 Endocrine, nutritional and metabolic diseases complicating pregnancy, second trimester: Secondary | ICD-10-CM | POA: Diagnosis not present

## 2019-05-10 DIAGNOSIS — O0990 Supervision of high risk pregnancy, unspecified, unspecified trimester: Secondary | ICD-10-CM | POA: Insufficient documentation

## 2019-05-10 DIAGNOSIS — O09292 Supervision of pregnancy with other poor reproductive or obstetric history, second trimester: Secondary | ICD-10-CM | POA: Diagnosis not present

## 2019-05-10 DIAGNOSIS — Z362 Encounter for other antenatal screening follow-up: Secondary | ICD-10-CM | POA: Diagnosis not present

## 2019-05-10 DIAGNOSIS — Z3A27 27 weeks gestation of pregnancy: Secondary | ICD-10-CM

## 2019-05-10 DIAGNOSIS — O3662X0 Maternal care for excessive fetal growth, second trimester, not applicable or unspecified: Secondary | ICD-10-CM

## 2019-05-10 DIAGNOSIS — O99212 Obesity complicating pregnancy, second trimester: Secondary | ICD-10-CM

## 2019-05-10 DIAGNOSIS — O3482 Maternal care for other abnormalities of pelvic organs, second trimester: Secondary | ICD-10-CM

## 2019-05-17 ENCOUNTER — Ambulatory Visit (INDEPENDENT_AMBULATORY_CARE_PROVIDER_SITE_OTHER): Payer: Medicaid Other | Admitting: Women's Health

## 2019-05-17 ENCOUNTER — Other Ambulatory Visit: Payer: Self-pay

## 2019-05-17 ENCOUNTER — Encounter: Payer: Self-pay | Admitting: General Practice

## 2019-05-17 ENCOUNTER — Ambulatory Visit (INDEPENDENT_AMBULATORY_CARE_PROVIDER_SITE_OTHER): Payer: Medicaid Other | Admitting: Licensed Clinical Social Worker

## 2019-05-17 ENCOUNTER — Encounter: Payer: Self-pay | Admitting: Women's Health

## 2019-05-17 VITALS — BP 116/82 | HR 103 | Temp 98.0°F | Wt 262.0 lb

## 2019-05-17 DIAGNOSIS — Z3A28 28 weeks gestation of pregnancy: Secondary | ICD-10-CM | POA: Diagnosis not present

## 2019-05-17 DIAGNOSIS — F419 Anxiety disorder, unspecified: Secondary | ICD-10-CM | POA: Diagnosis not present

## 2019-05-17 DIAGNOSIS — O3662X0 Maternal care for excessive fetal growth, second trimester, not applicable or unspecified: Secondary | ICD-10-CM

## 2019-05-17 DIAGNOSIS — O3660X0 Maternal care for excessive fetal growth, unspecified trimester, not applicable or unspecified: Secondary | ICD-10-CM | POA: Insufficient documentation

## 2019-05-17 DIAGNOSIS — O0993 Supervision of high risk pregnancy, unspecified, third trimester: Secondary | ICD-10-CM | POA: Diagnosis not present

## 2019-05-17 DIAGNOSIS — Z23 Encounter for immunization: Secondary | ICD-10-CM

## 2019-05-17 DIAGNOSIS — O99340 Other mental disorders complicating pregnancy, unspecified trimester: Secondary | ICD-10-CM | POA: Diagnosis not present

## 2019-05-17 DIAGNOSIS — Z283 Underimmunization status: Secondary | ICD-10-CM

## 2019-05-17 DIAGNOSIS — E071 Dyshormogenetic goiter: Secondary | ICD-10-CM

## 2019-05-17 DIAGNOSIS — O43199 Other malformation of placenta, unspecified trimester: Secondary | ICD-10-CM | POA: Insufficient documentation

## 2019-05-17 DIAGNOSIS — O09899 Supervision of other high risk pregnancies, unspecified trimester: Secondary | ICD-10-CM

## 2019-05-17 DIAGNOSIS — O0990 Supervision of high risk pregnancy, unspecified, unspecified trimester: Secondary | ICD-10-CM

## 2019-05-17 DIAGNOSIS — O99891 Other specified diseases and conditions complicating pregnancy: Secondary | ICD-10-CM

## 2019-05-17 NOTE — Progress Notes (Signed)
Subjective:  Kimberly [name] is a 29 y.o. G3P1011 at [redacted]w[redacted]d being seen today for ongoing prenatal care.  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  Patient reports no complaints.  Contractions: Not present. Vag. Bleeding: None.  Movement: Present. Denies leaking of fluid.   The following portions of the patient's history were reviewed and updated as appropriate: allergies, current medications, past family history, past medical history, past social history, past surgical history and problem list. Problem list updated.  Objective:   Vitals:   05/17/19 0813  BP: 116/82  Pulse: (!) 103  Temp: 98 F (36.7 C)  Weight: 262 lb (118.8 kg)    Fetal Status: Fetal Heart Rate (bpm): 146 Fundal Height: 36 cm Movement: Present     General:  Alert, oriented and cooperative. Patient is in no acute distress.  Skin: Skin is warm and dry. No rash noted.   Cardiovascular: Normal heart rate noted  Respiratory: Normal respiratory effort, no problems with respiration noted  Abdomen: Soft, gravid, appropriate for gestational age. Pain/Pressure: Absent     Pelvic: Vag. Bleeding: None     Cervical exam deferred        Extremities: Normal range of motion.  Edema: None  Mental Status: Normal mood and affect. Normal behavior. Normal judgment and thought content.   Urinalysis:      Assessment and Plan:  Pregnancy: G3P1011 at [redacted]w[redacted]d  1.  Supervision of high risk pregnancy, antepartum - patient taking baby ASA daily - peds list given - Glucose Tolerance, 2 Hours w/1 Hour - HIV Antibody (routine testing w rflx) - RPR - CBC - Tdap vaccine greater than or equal to 7yo IM  2. Need for tetanus, diphtheria, and acellular pertussis (Tdap) vaccine in patient of adolescent age or older - Tdap vaccine greater than or equal to 7yo IM  3. Rubella non-immune status, antepartum - vaccinate ppartum  4. LGA >99% - f/u US 06/14/2019, pt aware -S>D  5. Marginal insertion of umbilical cord affecting management of mother - f/u US 06/14/2019, pt aware  6. 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 Please refer to After Visit Summary for other counseling recommendations.  I discussed the assessment and treatment plan with the patient. The patient was provided an opportunity to ask questions and all were answered. The patient agreed with the plan and demonstrated an understanding of the instructions. The patient was advised to call back or seek an in-person office evaluation/go to MAU at [facility] for any urgent or concerning symptoms. Return in about 2 weeks (around 05/31/2019) for virtual ROB, referral to endocrinology, pt reports family med will not see her in pregnancy.  Koree Staheli, Gerrie Nordmann, NP

## 2019-05-17 NOTE — BH Specialist Note (Signed)
Integrated Behavioral Health Initial Visit  MRN: [id number] Name: Kimberly [name]  Number of [place] Clinician visits:: 1 Session Start time: 1032am Session End time: [id number] Total time: 17 mins  Type of Service: [place] Interpretor:no  Interpretor Name and Language: none   Warm Hand Off Completed.       SUBJECTIVE: Kimberly [name] is a 29 y.o. female accompanied by n/a Patient was referred by Kimberly Norse RN for high phq9 score. Patient reports the following symptoms/concerns: excessive worry Duration of problem: off and on for years ; Severity of problem: mild  OBJECTIVE: Mood: good and Affect: normal Risk of harm to self or others: No risk of harm to self or others.   LIFE CONTEXT: Family and Social: :Lives with immediately family in [place]  School/Work: n/a Self-Care: n/a Life Changes: n/a  GOALS ADDRESSED: Patient will: 1. Reduce symptoms of: anxiety 2. Increase knowledge of diagnosis and triggers 3. Demonstrate ability to: self manage symptoms   INTERVENTIONS: Interventions utilized: cbt  Standardized Assessments completed: PHQ(  ASSESSMENT: Patient currently experiencing anxiety disorder affecting pregnancy. Patient reports she consistently worries about various things. Patient reports taking medication in the past to assist with symptoms however that was a while ago. Patient reports living in a safe and secure environment. Denies domestic abuse and food insecurity. Patient reports she is looking for a new apartment for her growing family however she does not have any immediate needs affecting her physical health or safety.    Patient may benefit from integrated behavioral health   PLAN: 1. Follow up with behavioral health clinician on : four weeks  2. Behavioral recommendations: continue taking prenatal vitamins, practice mindfulness techniques to alleviate anxiety and increase social interaction   3. Referral(s): none 4. "From scale of 1-10, how likely are you to follow plan?":  Kimberly Ferrier, LCSW

## 2019-05-17 NOTE — Patient Instructions (Addendum)
Maternity Assessment Unit (MAU)  The Maternity Assessment Unit (MAU) is located at the [facility] [place] [facility]. The address is: [address] [place], [place], [place] [address]. Please see map below for additional directions.    The Maternity Assessment Unit is designed to help you during your pregnancy, and for up to 6 weeks after delivery, with any pregnancy- or postpartum-related emergencies, if you think you are in labor, or if your water has broken. For example, if you experience nausea and vomiting, vaginal bleeding, severe abdominal or pelvic pain, elevated blood pressure or other problems related to your pregnancy or postpartum time, please come to the Maternity Assessment Unit for assistance.      Glucose Tolerance Test During Pregnancy Why am I having this test? The glucose tolerance test (GTT) is done to check how your body processes sugar (glucose). This is one of several tests used to diagnose diabetes that develops during pregnancy (gestational diabetes mellitus). Gestational diabetes is a temporary form of diabetes that some women develop during pregnancy. It usually occurs during the second trimester of pregnancy and goes away after delivery. Testing (screening) for gestational diabetes usually occurs between 24 and 28 weeks of pregnancy. You may have the GTT test after having a 1-hour glucose screening test if the results from that test indicate that you may have gestational diabetes. You may also have this test if:  You have a history of gestational diabetes.  You have a history of giving birth to very large babies or have experienced repeated fetal loss (stillbirth).  You have signs and symptoms of diabetes, such as: ? Changes in your vision. ? Tingling or numbness in your hands or feet. ? Changes in hunger, thirst, and urination that are not otherwise explained by your pregnancy. What is being tested? This test measures the amount  of glucose in your blood at different times during a period of 3 hours. This indicates how well your body is able to process glucose. What kind of sample is taken?  Blood samples are required for this test. They are usually collected by inserting a needle into a blood vessel. How do I prepare for this test?  For 3 days before your test, eat normally. Have plenty of carbohydrate-rich foods.  Follow instructions from your health care provider about: ? Eating or drinking restrictions on the day of the test. You may be asked to not eat or drink anything other than water (fast) starting 8-10 hours before the test. ? Changing or stopping your regular medicines. Some medicines may interfere with this test. Tell a health care provider about:  All medicines you are taking, including vitamins, herbs, eye drops, creams, and over-the-counter medicines.  Any blood disorders you have.  Any surgeries you have had.  Any medical conditions you have. What happens during the test? First, your blood glucose will be measured. This is referred to as your fasting blood glucose, since you fasted before the test. Then, you will drink a glucose solution that contains a certain amount of glucose. Your blood glucose will be measured again 1, 2, and 3 hours after drinking the solution. This test takes about 3 hours to complete. You will need to stay at the testing location during this time. During the testing period:  Do not eat or drink anything other than the glucose solution.  Do not exercise.  Do not use any products that contain nicotine or tobacco, such as cigarettes and e-cigarettes. If you need  help stopping, ask your health care provider. The testing procedure may vary among health care providers and hospitals. How are the results reported? Your results will be reported as milligrams of glucose per deciliter of blood (mg/dL) or millimoles per liter (mmol/L). Your health care provider will compare your  results to normal ranges that were established after testing a large group of people (reference ranges). Reference ranges may vary among labs and hospitals. For this test, common reference ranges are:  Fasting: less than 95-105 mg/dL (5.3-5.8 mmol/L).  1 hour after drinking glucose: less than 180-190 mg/dL (10.0-10.5 mmol/L).  2 hours after drinking glucose: less than 155-165 mg/dL (8.6-9.2 mmol/L).  3 hours after drinking glucose: 140-145 mg/dL (7.8-8.1 mmol/L). What do the results mean? Results within reference ranges are considered normal, meaning that your glucose levels are well-controlled. If two or more of your blood glucose levels are high, you may be diagnosed with gestational diabetes. If only one level is high, your health care provider may suggest repeat testing or other tests to confirm a diagnosis. Talk with your health care provider about what your results mean. Questions to ask your health care provider Ask your health care provider, or the department that is doing the test:  When will my results be ready?  How will I get my results?  What are my treatment options?  What other tests do I need?  What are my next steps? Summary  The glucose tolerance test (GTT) is one of several tests used to diagnose diabetes that develops during pregnancy (gestational diabetes mellitus). Gestational diabetes is a temporary form of diabetes that some women develop during pregnancy.  You may have the GTT test after having a 1-hour glucose screening test if the results from that test indicate that you may have gestational diabetes. You may also have this test if you have any symptoms or risk factors for gestational diabetes.  Talk with your health care provider about what your results mean. This information is not intended to replace advice given to you by your health care provider. Make sure you discuss any questions you have with your health care provider. Document Revised: 04/28/2018  Document Reviewed: 08/17/2016 Elsevier Patient Education  Fancy Gap of Pregnancy The third trimester is from week 28 through week 40 (months 7 through 9). The third trimester is a time when the unborn baby (fetus) is growing rapidly. At the end of the ninth month, the fetus is about 20 inches in length and weighs 6-10 pounds. Body changes during your third trimester Your body will continue to go through many changes during pregnancy. The changes vary from woman to woman. During the third trimester:  Your weight will continue to increase. You can expect to gain 25-35 pounds (11-16 kg) by the end of the pregnancy.  You may begin to get stretch marks on your hips, abdomen, and breasts.  You may urinate more often because the fetus is moving lower into your pelvis and pressing on your bladder.  You may develop or continue to have heartburn. This is caused by increased hormones that slow down muscles in the digestive tract.  You may develop or continue to have constipation because increased hormones slow digestion and cause the muscles that push waste through your intestines to relax.  You may develop hemorrhoids. These are swollen veins (varicose veins) in the rectum that can itch or be painful.  You may develop swollen, bulging veins (varicose veins) in  your legs.  You may have increased body aches in the pelvis, back, or thighs. This is due to weight gain and increased hormones that are relaxing your joints.  You may have changes in your hair. These can include thickening of your hair, rapid growth, and changes in texture. Some women also have hair loss during or after pregnancy, or hair that feels dry or thin. Your hair will most likely return to normal after your baby is born.  Your breasts will continue to grow and they will continue to become tender. A yellow fluid (colostrum) may leak from your breasts. This is the first milk you are producing for your  baby.  Your belly button may stick out.  You may notice more swelling in your hands, face, or ankles.  You may have increased tingling or numbness in your hands, arms, and legs. The skin on your belly may also feel numb.  You may feel short of breath because of your expanding uterus.  You may have more problems sleeping. This can be caused by the size of your belly, increased need to urinate, and an increase in your body's metabolism.  You may notice the fetus "dropping," or moving lower in your abdomen (lightening).  You may have increased vaginal discharge.  You may notice your joints feel loose and you may have pain around your pelvic bone. What to expect at prenatal visits You will have prenatal exams every 2 weeks until week 36. Then you will have weekly prenatal exams. During a routine prenatal visit:  You will be weighed to make sure you and the baby are growing normally.  Your blood pressure will be taken.  Your abdomen will be measured to track your baby's growth.  The fetal heartbeat will be listened to.  Any test results from the previous visit will be discussed.  You may have a cervical check near your due date to see if your cervix has softened or thinned (effaced).  You will be tested for Group B streptococcus. This happens between 35 and 37 weeks. Your health care provider may ask you:  What your birth plan is.  How you are feeling.  If you are feeling the baby move.  If you have had any abnormal symptoms, such as leaking fluid, bleeding, severe headaches, or abdominal cramping.  If you are using any tobacco products, including cigarettes, chewing tobacco, and electronic cigarettes.  If you have any questions. Other tests or screenings that may be performed during your third trimester include:  Blood tests that check for low iron levels (anemia).  Fetal testing to check the health, activity level, and growth of the fetus. Testing is done if you have  certain medical conditions or if there are problems during the pregnancy.  Nonstress test (NST). This test checks the health of your baby to make sure there are no signs of problems, such as the baby not getting enough oxygen. During this test, a belt is placed around your belly. The baby is made to move, and its heart rate is monitored during movement. What is false labor? False labor is a condition in which you feel small, irregular tightenings of the muscles in the womb (contractions) that usually go away with rest, changing position, or drinking water. These are called Braxton Hicks contractions. Contractions may last for hours, days, or even weeks before true labor sets in. If contractions come at regular intervals, become more frequent, increase in intensity, or become painful, you should see your health  care provider. What are the signs of labor?  Abdominal cramps.  Regular contractions that start at 10 minutes apart and become stronger and more frequent with time.  Contractions that start on the top of the uterus and spread down to the lower abdomen and back.  Increased pelvic pressure and dull back pain.  A watery or bloody mucus discharge that comes from the vagina.  Leaking of amniotic fluid. This is also known as your "water breaking." It could be a slow trickle or a gush. Let your health care provider know if it has a color or strange odor. If you have any of these signs, call your health care provider right away, even if it is before your due date. Follow these instructions at home: Medicines  Follow your health care provider's instructions regarding medicine use. Specific medicines may be either safe or unsafe to take during pregnancy.  Take a prenatal vitamin that contains at least 600 micrograms (mcg) of folic acid.  If you develop constipation, try taking a stool softener if your health care provider approves. Eating and drinking   Eat a balanced diet that includes  fresh fruits and vegetables, whole grains, good sources of protein such as meat, eggs, or tofu, and low-fat dairy. Your health care provider will help you determine the amount of weight gain that is right for you.  Avoid raw meat and uncooked cheese. These carry germs that can cause birth defects in the baby.  If you have low calcium intake from food, talk to your health care provider about whether you should take a daily calcium supplement.  Eat four or five small meals rather than three large meals a day.  Limit foods that are high in fat and processed sugars, such as fried and sweet foods.  To prevent constipation: ? Drink enough fluid to keep your urine clear or pale yellow. ? Eat foods that are high in fiber, such as fresh fruits and vegetables, whole grains, and beans. Activity  Exercise only as directed by your health care provider. Most women can continue their usual exercise routine during pregnancy. Try to exercise for 30 minutes at least 5 days a week. Stop exercising if you experience uterine contractions.  Avoid heavy lifting.  Do not exercise in extreme heat or humidity, or at high altitudes.  Wear low-heel, comfortable shoes.  Practice good posture.  You may continue to have sex unless your health care provider tells you otherwise. Relieving pain and discomfort  Take frequent breaks and rest with your legs elevated if you have leg cramps or low back pain.  Take warm sitz baths to soothe any pain or discomfort caused by hemorrhoids. Use hemorrhoid cream if your health care provider approves.  Wear a good support bra to prevent discomfort from breast tenderness.  If you develop varicose veins: ? Wear support pantyhose or compression stockings as told by your healthcare provider. ? Elevate your feet for 15 minutes, 3-4 times a day. Prenatal care  Write down your questions. Take them to your prenatal visits.  Keep all your prenatal visits as told by your health  care provider. This is important. Safety  Wear your seat belt at all times when driving.  Make a list of emergency phone numbers, including numbers for family, friends, the hospital, and police and fire departments. General instructions  Avoid cat litter boxes and soil used by cats. These carry germs that can cause birth defects in the baby. If you have a cat, ask someone  to clean the litter box for you.  Do not travel far distances unless it is absolutely necessary and only with the approval of your health care provider.  Do not use hot tubs, steam rooms, or saunas.  Do not drink alcohol.  Do not use any products that contain nicotine or tobacco, such as cigarettes and e-cigarettes. If you need help quitting, ask your health care provider.  Do not use any medicinal herbs or unprescribed drugs. These chemicals affect the formation and growth of the baby.  Do not douche or use tampons or scented sanitary pads.  Do not cross your legs for long periods of time.  To prepare for the arrival of your baby: ? Take prenatal classes to understand, practice, and ask questions about labor and delivery. ? Make a trial run to the hospital. ? Visit the hospital and tour the maternity area. ? Arrange for maternity or paternity leave through employers. ? Arrange for family and friends to take care of pets while you are in the hospital. ? Purchase a rear-facing car seat and make sure you know how to install it in your car. ? Pack your hospital bag. ? Prepare the baby's nursery. Make sure to remove all pillows and stuffed animals from the baby's crib to prevent suffocation.  Visit your dentist if you have not gone during your pregnancy. Use a soft toothbrush to brush your teeth and be gentle when you floss. Contact a health care provider if:  You are unsure if you are in labor or if your water has broken.  You become dizzy.  You have mild pelvic cramps, pelvic pressure, or nagging pain in your  abdominal area.  You have lower back pain.  You have persistent nausea, vomiting, or diarrhea.  You have an unusual or bad smelling vaginal discharge.  You have pain when you urinate. Get help right away if:  Your water breaks before 37 weeks.  You have regular contractions less than 5 minutes apart before 37 weeks.  You have a fever.  You are leaking fluid from your vagina.  You have spotting or bleeding from your vagina.  You have severe abdominal pain or cramping.  You have rapid weight loss or weight gain.  You have shortness of breath with chest pain.  You notice sudden or extreme swelling of your face, hands, ankles, feet, or legs.  Your baby makes fewer than 10 movements in 2 hours.  You have severe headaches that do not go away when you take medicine.  You have vision changes. Summary  The third trimester is from week 28 through week 40, months 7 through 9. The third trimester is a time when the unborn baby (fetus) is growing rapidly.  During the third trimester, your discomfort may increase as you and your baby continue to gain weight. You may have abdominal, leg, and back pain, sleeping problems, and an increased need to urinate.  During the third trimester your breasts will keep growing and they will continue to become tender. A yellow fluid (colostrum) may leak from your breasts. This is the first milk you are producing for your baby.  False labor is a condition in which you feel small, irregular tightenings of the muscles in the womb (contractions) that eventually go away. These are called Braxton Hicks contractions. Contractions may last for hours, days, or even weeks before true labor sets in.  Signs of labor can include: abdominal cramps; regular contractions that start at 10 minutes apart and become  stronger and more frequent with time; watery or bloody mucus discharge that comes from the vagina; increased pelvic pressure and dull back pain; and leaking of  amniotic fluid. This information is not intended to replace advice given to you by your health care provider. Make sure you discuss any questions you have with your health care provider. Document Revised: 04/28/2018 Document Reviewed: 02/11/2016 Elsevier Patient Education  [place].       Preterm Labor and Birth Information  The normal length of a pregnancy is 39-41 weeks. Preterm labor is when labor starts before 37 completed weeks of pregnancy. What are the risk factors for preterm labor? Preterm labor is more likely to occur in women who:  Have certain infections during pregnancy such as a bladder infection, sexually transmitted infection, or infection inside the uterus (chorioamnionitis).  Have a shorter-than-normal cervix.  Have gone into preterm labor before.  Have had surgery on their cervix.  Are younger than age 32 or older than age 15.  Are African American.  Are pregnant with twins or multiple babies (multiple gestation).  Take street drugs or smoke while pregnant.  Do not gain enough weight while pregnant.  Became pregnant shortly after having been pregnant. What are the symptoms of preterm labor? Symptoms of preterm labor include:  Cramps similar to those that can happen during a menstrual period. The cramps may happen with diarrhea.  Pain in the abdomen or lower back.  Regular uterine contractions that may feel like tightening of the abdomen.  A feeling of increased pressure in the pelvis.  Increased watery or bloody mucus discharge from the vagina.  Water breaking (ruptured amniotic sac). Why is it important to recognize signs of preterm labor? It is important to recognize signs of preterm labor because babies who are born prematurely may not be fully developed. This can put them at an increased risk for:  Long-term (chronic) heart and lung problems.  Difficulty immediately after birth with regulating body systems, including blood sugar,  body temperature, heart rate, and breathing rate.  Bleeding in the brain.  Cerebral palsy.  Learning difficulties.  Death. These risks are highest for babies who are born before 90 weeks of pregnancy. How is preterm labor treated? Treatment depends on the length of your pregnancy, your condition, and the health of your baby. It may involve:  Having a stitch (suture) placed in your cervix to prevent your cervix from opening too early (cerclage).  Taking or being given medicines, such as: ? Hormone medicines. These may be given early in pregnancy to help support the pregnancy. ? Medicine to stop contractions. ? Medicines to help mature the baby's lungs. These may be prescribed if the risk of delivery is high. ? Medicines to prevent your baby from developing cerebral palsy. If the labor happens before 34 weeks of pregnancy, you may need to stay in the hospital. What should I do if I think I am in preterm labor? If you think that you are going into preterm labor, call your health care provider right away. How can I prevent preterm labor in future pregnancies? To increase your chance of having a full-term pregnancy:  Do not use any tobacco products, such as cigarettes, chewing tobacco, and e-cigarettes. If you need help quitting, ask your health care provider.  Do not use street drugs or medicines that have not been prescribed to you during your pregnancy.  Talk with your health care provider before taking any herbal supplements, even if you have been  taking them regularly.  Make sure you gain a healthy amount of weight during your pregnancy.  Watch for infection. If you think that you might have an infection, get it checked right away.  Make sure to tell your health care provider if you have gone into preterm labor before. This information is not intended to replace advice given to you by your health care provider. Make sure you discuss any questions you have with your health care  provider. Document Revised: 04/29/2018 Document Reviewed: 05/29/2015 Elsevier Patient Education  [place] [facility]  [facility]  [address] [place], [place] [address] Phone - [phone]   Fax - [place] [address] [place], [place]  [address] Phone - [phone]   Fax - [phone]  [place]. [address] [place] [address] [place], [place]  [address] Phone - [phone]   Fax - [phone]  [facility] [address] [place], [place]  [address] Phone - [phone]   Fax - [phone]  [place] [address] [place], [place]  [address] Phone - [phone]   Fax - [place] [id number] [place], [place]  [address] Phone - [phone]   Fax - [place] [facility] [place] [address] [place], [place]  [address] Phone - [phone]   Fax - [phone]  [place] [address] [place], [place], [place]  [address] Phone - [phone]   Fax - [place] [address] [place], [place]  [address] Phone - [phone]   Fax - [phone] [facility] [place] [place]. [address] [place], [place]  [address] Phone - [phone]   Fax - [phone]  [place] [address] [place], [place]  [address] Phone - [phone]   Fax - [phone]  [facility] [place], [place], [place]  [address] Phone - [phone]   Fax - [place] [address] [place], [place]  [address] Phone - [phone]   Fax - [phone]  [facility] [address], [place], [place]  [address] Phone - [place], [place]  [address] Phone - [phone]   Fax - [place] [address], [place], [place]  [address] Phone - [phone]   Fax - [phone]  [place] [address] [place], [place]  [address] Phone - [phone]   Fax - [place]. [place], [place]  [address] Phone - [phone]   Fax - [place], [place], [place]  [address] Phone - [phone]   Fax - [place] [address] [place], [place]  [address] Phone - [phone]   Fax - [phone]  [place] [address], [place], [place]  [address] Phone - [phone]   Fax - [place] [address], [place], [place]  [address] Phone - [phone]   Fax - [place] [address] [place],   [address] Phone - [phone]   Fax - [phone] [place] -  [place] [address] [place], [place]  [address] Phone - [phone]   Fax - [phone]  [place] [address] [place], [place]  [address] Phone - [phone]   Fax - [place] [address] [place], [place]  [address] Phone - [phone]   Fax [phone]        Preeclampsia and Eclampsia Preeclampsia is a serious condition that may develop during pregnancy. This condition causes high blood pressure and increased protein in your urine along with other symptoms, such as headaches and vision changes. These symptoms may develop as the condition gets worse. Preeclampsia may occur at 20 weeks of pregnancy or later. Diagnosing and treating preeclampsia early is very important. If not treated early, it can cause serious problems for you and  your baby. One problem it can lead to is eclampsia. Eclampsia is a condition that causes muscle jerking or shaking (convulsions or seizures) and other serious problems for the mother. During pregnancy, delivering your baby may be the best treatment for preeclampsia or eclampsia. For most women, preeclampsia and eclampsia symptoms go away after giving birth. In rare cases, a woman may develop preeclampsia after giving birth (postpartum preeclampsia). This usually occurs within 48 hours after childbirth but may occur up to 6 weeks after giving birth. What are the causes? The cause of preeclampsia is not known. What increases the risk? The following risk factors make you more likely to develop preeclampsia:  Being pregnant for the first time.  Having had preeclampsia during a past pregnancy.  Having a family history of preeclampsia.  Having high blood pressure.  Being pregnant with more than one baby.  Being 73 or older.  Being African-American.  Having kidney disease or diabetes.  Having medical conditions such as lupus or blood diseases.  Being very overweight (obese). What are the signs or symptoms? The most common symptoms are:  Severe headaches.  Vision problems, such as blurred or double vision.  Abdominal pain, especially upper abdominal pain. Other symptoms that may develop as the condition gets worse include:  Sudden weight gain.  Sudden swelling of the hands, face, legs, and feet.  Severe nausea and vomiting.  Numbness in the face, arms, legs, and feet.  Dizziness.  Urinating less than usual.  Slurred speech.  Convulsions or seizures. How is this diagnosed? There are no screening tests for preeclampsia. Your health care provider will ask you about symptoms and check for signs of preeclampsia during your prenatal visits. You may also have tests that include:  Checking your blood pressure.  Urine tests to check for protein. Your health care provider will  check for this at every prenatal visit.  Blood tests.  Monitoring your baby's heart rate.  Ultrasound. How is this treated? You and your health care provider will determine the treatment approach that is best for you. Treatment may include:  Having more frequent prenatal exams to check for signs of preeclampsia, if you have an increased risk for preeclampsia.  Medicine to lower your blood pressure.  Staying in the hospital, if your condition is severe. There, treatment will focus on controlling your blood pressure and the amount of fluids in your body (fluid retention).  Taking medicine (magnesium sulfate) to prevent seizures. This may be given as an injection or through an IV.  Taking a low-dose aspirin during your pregnancy.  Delivering your baby early. You may have your labor started with medicine (induced), or  you may have a cesarean delivery. Follow these instructions at home: Eating and drinking   Drink enough fluid to keep your urine pale yellow.  Avoid caffeine. Lifestyle  Do not use any products that contain nicotine or tobacco, such as cigarettes and e-cigarettes. If you need help quitting, ask your health care provider.  Do not use alcohol or drugs.  Avoid stress as much as possible. Rest and get plenty of sleep. General instructions  Take over-the-counter and prescription medicines only as told by your health care provider.  When lying down, lie on your left side. This keeps pressure off your major blood vessels.  When sitting or lying down, raise (elevate) your feet. Try putting some pillows underneath your lower legs.  Exercise regularly. Ask your health care provider what kinds of exercise are best for you.  Keep all follow-up and prenatal visits as told by your health care provider. This is important. How is this prevented? There is no known way of preventing preeclampsia or eclampsia from developing. However, to lower your risk of complications and  detect problems early:  Get regular prenatal care. Your health care provider may be able to diagnose and treat the condition early.  Maintain a healthy weight. Ask your health care provider for help managing weight gain during pregnancy.  Work with your health care provider to manage any long-term (chronic) health conditions you have, such as diabetes or kidney problems.  You may have tests of your blood pressure and kidney function after giving birth.  Your health care provider may have you take low-dose aspirin during your next pregnancy. Contact a health care provider if:  You have symptoms that your health care provider told you may require more treatment or monitoring, such as: ? Headaches. ? Nausea or vomiting. ? Abdominal pain. ? Dizziness. ? Light-headedness. Get help right away if:  You have severe: ? Abdominal pain. ? Headaches that do not get better. ? Dizziness. ? Vision problems. ? Confusion. ? Nausea or vomiting.  You have any of the following: ? A seizure. ? Sudden, rapid weight gain. ? Sudden swelling in your hands, ankles, or face. ? Trouble moving any part of your body. ? Numbness in any part of your body. ? Trouble speaking. ? Abnormal bleeding.  You faint. Summary  Preeclampsia is a serious condition that may develop during pregnancy.  This condition causes high blood pressure and increased protein in your urine along with other symptoms, such as headaches and vision changes.  Diagnosing and treating preeclampsia early is very important. If not treated early, it can cause serious problems for you and your baby.  Get help right away if you have symptoms that your health care provider told you to watch for. This information is not intended to replace advice given to you by your health care provider. Make sure you discuss any questions you have with your health care provider. Document Revised: 09/07/2017 Document Reviewed: 08/12/2015 Elsevier Patient  Education  [place].

## 2019-05-18 ENCOUNTER — Other Ambulatory Visit: Payer: Self-pay | Admitting: Women's Health

## 2019-05-18 ENCOUNTER — Encounter: Payer: Self-pay | Admitting: Women's Health

## 2019-05-18 ENCOUNTER — Telehealth: Payer: Self-pay | Admitting: *Deleted

## 2019-05-18 DIAGNOSIS — O24419 Gestational diabetes mellitus in pregnancy, unspecified control: Secondary | ICD-10-CM

## 2019-05-18 DIAGNOSIS — O99019 Anemia complicating pregnancy, unspecified trimester: Secondary | ICD-10-CM | POA: Insufficient documentation

## 2019-05-18 DIAGNOSIS — O24410 Gestational diabetes mellitus in pregnancy, diet controlled: Secondary | ICD-10-CM | POA: Insufficient documentation

## 2019-05-18 DIAGNOSIS — E071 Dyshormogenetic goiter: Secondary | ICD-10-CM

## 2019-05-18 LAB — CBC
Hematocrit: 30.2 % — ABNORMAL LOW (ref 34.0–46.6)
Hemoglobin: 9.5 g/dL — ABNORMAL LOW (ref 11.1–15.9)
MCH: 24.7 pg — ABNORMAL LOW (ref 26.6–33.0)
MCHC: 31.5 g/dL (ref 31.5–35.7)
MCV: 79 fL (ref 79–97)
Platelets: 307 10*3/uL (ref 150–450)
RBC: 3.84 x10E6/uL (ref 3.77–5.28)
RDW: 15 % (ref 11.7–15.4)
WBC: 11.4 10*3/uL — ABNORMAL HIGH (ref 3.4–10.8)

## 2019-05-18 LAB — GLUCOSE TOLERANCE, 2 HOURS W/ 1HR
Glucose, 1 hour: 181 mg/dL — ABNORMAL HIGH (ref 65–179)
Glucose, 2 hour: 122 mg/dL (ref 65–152)
Glucose, Fasting: 91 mg/dL (ref 65–91)

## 2019-05-18 LAB — RPR: RPR Ser Ql: NONREACTIVE

## 2019-05-18 LAB — TSH: TSH: 0.195 u[IU]/mL — ABNORMAL LOW (ref 0.450–4.500)

## 2019-05-18 LAB — HIV ANTIBODY (ROUTINE TESTING W REFLEX): HIV Screen 4th Generation wRfx: NONREACTIVE

## 2019-05-18 LAB — T4, FREE: Free T4: 1.25 ng/dL (ref 0.82–1.77)

## 2019-05-18 LAB — T4: T4, Total: 15.1 ug/dL — ABNORMAL HIGH (ref 4.5–12.0)

## 2019-05-18 MED ORDER — ACCU-CHEK GUIDE VI STRP: ORAL_STRIP | 12 refills | Status: DC

## 2019-05-18 MED ORDER — ACCU-CHEK SOFTCLIX LANCETS MISC: 12 refills | Status: DC

## 2019-05-18 MED ORDER — ACCU-CHEK GUIDE ME W/DEVICE KIT: 1.0000 | PACK | Freq: Four times a day (QID) | 0 refills | Status: DC

## 2019-05-18 MED ORDER — LEVOTHYROXINE SODIUM 150 MCG PO TABS: 150.0000 ug | ORAL_TABLET | Freq: Every day | ORAL | 3 refills | Status: DC

## 2019-05-18 NOTE — Progress Notes (Signed)
Sent in new RX for 169mcg Synthroid per consultation with Dr. Harolyn Rutherford.  Clarisa Fling, NP  8:03 PM 05/18/2019

## 2019-05-18 NOTE — Telephone Encounter (Signed)
-----   Message from Clarisa Fling, NP sent at 05/18/2019  3:54 PM EDT ----- That sounds fine with me. Per Dr. Harolyn Rutherford, she is hyperthyroid only because she is taking more Synthroid than she needs, so by reducing her medication it should correct the issues. Not sure if that still requires a transfer, but if it does, please let her know that too. Thank you, Elmyra Ricks ----- Message ----- From: Derl Barrow, RN Sent: 05/18/2019   3:46 PM EDT To: Clarisa Fling, NP  With her being hyperthyroid now, that is considered high risk for [facility]. It is on list to transfer patients from [facility] to another clinic with MD.  Derl Barrow, RN ----- Message ----- From: Clarisa Fling, NP Sent: 05/18/2019   3:10 PM EDT To: Derl Barrow, RN  Good afternoon,  Please call patient to relay the following information:  1. Thyroid labs show patient is slightly hyperthyroid. Please tell patient to reduce Synthroid to 174mcg daily. If she needs a new prescription, please let me know and I will send it in for her. We will recheck her labs in 4 weeks.  2. Patient also has anemia. Please let patient know and start on iron per protocol.  3. Patient also has gestational diabetes. Please let her know and help her to get set-up with nutrition counseling and please make a note for them to address anemia simultaneously.  I do see this patient has MyChart, but because of medication changes and so many things to notify her about, I felt it would be better to call her and allow her to ask questions and confirm understanding.  Thank you, Elmyra Ricks

## 2019-05-18 NOTE — Telephone Encounter (Signed)
Spoke with patient regarding results. Pt verified DOB. Patient advised that she is slightly hyperthyroid and need to decrease Synthroid to 150 mcg daily. She also has gestational diabetes and anemia. Patient requested a 90 day supply of Synthroid. Patient reported that she can not take iron tablets because it makes her really sick. Discussed be transferred to another clinic due to her being hyperthyroid to see a MD. Patient agreed. Will get patient appointment for GDM/anemia education.  Will contact [facility] for transfer.  Derl Barrow, RN

## 2019-05-18 NOTE — Progress Notes (Signed)
Good afternoon,  Please call patient to relay the following information:  1. Thyroid labs show patient is slightly hyperthyroid. Please tell patient to reduce Synthroid to 128mcg daily. If she needs a new prescription, please let me know and I will send it in for her. We will recheck her labs in 4 weeks.  2. Patient also has anemia. Please let patient know and start on iron per protocol.  3. Patient also has gestational diabetes. Please let her know and help her to get set-up with nutrition counseling and please make a note for them to address anemia simultaneously.  I do see this patient has MyChart, but because of medication changes and so many things to notify her about, I felt it would be better to call her and allow her to ask questions and confirm understanding.  Thank you, Elmyra Ricks

## 2019-05-23 ENCOUNTER — Other Ambulatory Visit: Payer: Self-pay | Admitting: *Deleted

## 2019-05-23 MED ORDER — PRENATAL VITAMIN PLUS LOW IRON 27-1 MG PO TABS: 1.0000 | ORAL_TABLET | Freq: Every day | ORAL | 9 refills | Status: DC

## 2019-05-23 NOTE — Progress Notes (Signed)
Patient sent a message requesting PNV with low iron. Medication sent to pharmacy per patient request.  Derl Barrow, RN

## 2019-05-25 ENCOUNTER — Encounter: Payer: Medicaid Other | Attending: Women's Health | Admitting: Registered"

## 2019-05-25 ENCOUNTER — Ambulatory Visit: Payer: Medicaid Other | Admitting: Registered"

## 2019-05-25 ENCOUNTER — Other Ambulatory Visit: Payer: Self-pay | Admitting: Women's Health

## 2019-05-25 ENCOUNTER — Other Ambulatory Visit: Payer: Self-pay

## 2019-05-25 DIAGNOSIS — O24419 Gestational diabetes mellitus in pregnancy, unspecified control: Secondary | ICD-10-CM | POA: Insufficient documentation

## 2019-05-25 DIAGNOSIS — O99013 Anemia complicating pregnancy, third trimester: Secondary | ICD-10-CM

## 2019-05-25 DIAGNOSIS — O24410 Gestational diabetes mellitus in pregnancy, diet controlled: Secondary | ICD-10-CM

## 2019-05-25 MED ORDER — FERROUS SULFATE 325 (65 FE) MG PO TABS: 325.0000 mg | ORAL_TABLET | ORAL | 3 refills | Status: DC

## 2019-05-25 NOTE — Progress Notes (Signed)
Patient was seen on 05/25/19 for Gestational Diabetes self-management. EDD 08/08/19, may be 1 week off. Patient states no history of GDM. Diet history obtained. Patient eats variety of all food groups. Beverages include water.  Patient is not likely consuming excess carbohydrates  The following learning objectives were met by the patient :   States the definition of Gestational Diabetes  States why dietary management is important in controlling blood glucose  Describes the effects of carbohydrates on blood glucose levels  Demonstrates ability to create a balanced meal plan  Demonstrates carbohydrate counting   States when to check blood glucose levels  Demonstrates proper blood glucose monitoring techniques  States the effect of stress and exercise on blood glucose levels  States the importance of limiting caffeine and abstaining from alcohol and smoking  Also discussed role of metformin for PCOS for future reference.  Plan:  Aim for 3 Carbohydrate Choices per meal (45 grams) +/- 1 either way  Aim for 1-2 Carbohydrate Choices per snack Begin reading food labels for Total Carbohydrate of foods If OK with your MD, consider  increasing your activity level by walking, Arm Chair Exercises or other activity daily as tolerated Begin checking Blood Glucose before breakfast and 2 hours after first bite of breakfast, lunch and dinner as directed by MD  Bring Log Book/Sheet and meter to every medical appointment  Baby Scripts: Was not able to enroll Patient already in systom. RD sent e-mail to client support to reactivate enrollment. Pt plans to use to record of blood glucose Take medication if directed by MD  Patient already has a meter and has just started testing, but not at the recommended pre breakfast and 2 hours after each meal.  Patient instructed to monitor glucose levels: FBS: 60 - 95 mg/dl 2 hour: <120 mg/dl  Patient received the following handouts:  Nutrition Diabetes and  Pregnancy  Carbohydrate Counting List  Blood glucose Log Sheet  Iron MNT from AND  PCOS resources  Patient will be seen for follow-up in as needed.

## 2019-05-25 NOTE — Progress Notes (Signed)
RX ferrous sulfate.  Clarisa Fling, NP  9:05 AM 05/25/2019

## 2019-06-02 ENCOUNTER — Telehealth: Payer: Medicaid Other

## 2019-06-05 ENCOUNTER — Other Ambulatory Visit: Payer: Self-pay | Admitting: *Deleted

## 2019-06-05 DIAGNOSIS — O24419 Gestational diabetes mellitus in pregnancy, unspecified control: Secondary | ICD-10-CM

## 2019-06-06 ENCOUNTER — Other Ambulatory Visit: Payer: Self-pay

## 2019-06-07 ENCOUNTER — Telehealth (INDEPENDENT_AMBULATORY_CARE_PROVIDER_SITE_OTHER): Payer: Self-pay | Admitting: Obstetrics and Gynecology

## 2019-06-07 ENCOUNTER — Encounter: Payer: Self-pay | Admitting: Obstetrics and Gynecology

## 2019-06-07 DIAGNOSIS — O43199 Other malformation of placenta, unspecified trimester: Secondary | ICD-10-CM

## 2019-06-07 DIAGNOSIS — Z6841 Body Mass Index (BMI) 40.0 and over, adult: Secondary | ICD-10-CM | POA: Insufficient documentation

## 2019-06-07 DIAGNOSIS — O0990 Supervision of high risk pregnancy, unspecified, unspecified trimester: Secondary | ICD-10-CM

## 2019-06-07 DIAGNOSIS — O3662X0 Maternal care for excessive fetal growth, second trimester, not applicable or unspecified: Secondary | ICD-10-CM

## 2019-06-07 DIAGNOSIS — R42 Dizziness and giddiness: Secondary | ICD-10-CM

## 2019-06-07 DIAGNOSIS — O24410 Gestational diabetes mellitus in pregnancy, diet controlled: Secondary | ICD-10-CM

## 2019-06-07 NOTE — Progress Notes (Signed)
Pt never logged into My Chart for virtual visit but did send message that her phone died. Called her back, went straight to VM.

## 2019-06-07 NOTE — Progress Notes (Signed)
Pt states currently does not have access to her BP Cuff, will take later & record in My Chart.  Pt writes Glucose readings on Paper.

## 2019-06-07 NOTE — Progress Notes (Signed)
I connected with  Kimberly [name] on 06/07/19 at  3:15 PM EDT by telephone and verified that I am speaking with the correct person using two identifiers.   I discussed the limitations, risks, security and privacy concerns of performing an evaluation and management service by telephone and the availability of in person appointments. I also discussed with the patient that there may be a patient responsible charge related to this service. The patient expressed understanding and agreed to proceed.  Bethanne Ginger, [place] 06/07/2019  3:18 PM

## 2019-06-08 ENCOUNTER — Telehealth: Payer: Medicaid Other | Admitting: Obstetrics and Gynecology

## 2019-06-14 ENCOUNTER — Ambulatory Visit: Payer: Medicaid Other | Admitting: *Deleted

## 2019-06-14 ENCOUNTER — Ambulatory Visit (HOSPITAL_COMMUNITY): Payer: Medicaid Other | Attending: Obstetrics and Gynecology

## 2019-06-14 ENCOUNTER — Encounter: Payer: Self-pay | Admitting: Advanced Practice Midwife

## 2019-06-14 ENCOUNTER — Other Ambulatory Visit: Payer: Self-pay

## 2019-06-14 DIAGNOSIS — O99283 Endocrine, nutritional and metabolic diseases complicating pregnancy, third trimester: Secondary | ICD-10-CM

## 2019-06-14 DIAGNOSIS — O3663X0 Maternal care for excessive fetal growth, third trimester, not applicable or unspecified: Secondary | ICD-10-CM | POA: Diagnosis not present

## 2019-06-14 DIAGNOSIS — O99213 Obesity complicating pregnancy, third trimester: Secondary | ICD-10-CM

## 2019-06-14 DIAGNOSIS — O09293 Supervision of pregnancy with other poor reproductive or obstetric history, third trimester: Secondary | ICD-10-CM | POA: Diagnosis not present

## 2019-06-14 DIAGNOSIS — O0990 Supervision of high risk pregnancy, unspecified, unspecified trimester: Secondary | ICD-10-CM | POA: Insufficient documentation

## 2019-06-14 DIAGNOSIS — R42 Dizziness and giddiness: Secondary | ICD-10-CM

## 2019-06-14 DIAGNOSIS — Z3A32 32 weeks gestation of pregnancy: Secondary | ICD-10-CM

## 2019-06-14 DIAGNOSIS — Z362 Encounter for other antenatal screening follow-up: Secondary | ICD-10-CM | POA: Diagnosis not present

## 2019-06-14 DIAGNOSIS — O43199 Other malformation of placenta, unspecified trimester: Secondary | ICD-10-CM

## 2019-06-14 DIAGNOSIS — O24410 Gestational diabetes mellitus in pregnancy, diet controlled: Secondary | ICD-10-CM

## 2019-06-14 DIAGNOSIS — O3662X0 Maternal care for excessive fetal growth, second trimester, not applicable or unspecified: Secondary | ICD-10-CM | POA: Diagnosis present

## 2019-06-14 DIAGNOSIS — O3483 Maternal care for other abnormalities of pelvic organs, third trimester: Secondary | ICD-10-CM

## 2019-06-14 DIAGNOSIS — E079 Disorder of thyroid, unspecified: Secondary | ICD-10-CM

## 2019-06-15 ENCOUNTER — Other Ambulatory Visit: Payer: Self-pay | Admitting: *Deleted

## 2019-06-15 DIAGNOSIS — O43199 Other malformation of placenta, unspecified trimester: Secondary | ICD-10-CM

## 2019-06-22 ENCOUNTER — Other Ambulatory Visit: Payer: Medicaid Other

## 2019-06-22 ENCOUNTER — Other Ambulatory Visit: Payer: Self-pay | Admitting: *Deleted

## 2019-06-22 DIAGNOSIS — O24410 Gestational diabetes mellitus in pregnancy, diet controlled: Secondary | ICD-10-CM

## 2019-06-27 ENCOUNTER — Telehealth: Payer: Medicaid Other | Admitting: Family Medicine

## 2019-06-28 ENCOUNTER — Other Ambulatory Visit: Payer: Medicaid Other

## 2019-06-28 ENCOUNTER — Other Ambulatory Visit: Payer: Self-pay

## 2019-06-28 DIAGNOSIS — O24410 Gestational diabetes mellitus in pregnancy, diet controlled: Secondary | ICD-10-CM

## 2019-06-29 LAB — HEMOGLOBIN A1C
Est. average glucose Bld gHb Est-mCnc: 94 mg/dL
Hgb A1c MFr Bld: 4.9 % (ref 4.8–5.6)

## 2019-07-13 ENCOUNTER — Other Ambulatory Visit: Payer: Self-pay

## 2019-07-13 ENCOUNTER — Encounter: Payer: Medicaid Other | Admitting: Obstetrics & Gynecology

## 2019-07-13 ENCOUNTER — Ambulatory Visit: Payer: Medicaid Other | Admitting: *Deleted

## 2019-07-13 ENCOUNTER — Ambulatory Visit: Payer: Medicaid Other | Attending: Obstetrics and Gynecology

## 2019-07-13 DIAGNOSIS — O99280 Endocrine, nutritional and metabolic diseases complicating pregnancy, unspecified trimester: Secondary | ICD-10-CM

## 2019-07-13 DIAGNOSIS — Z362 Encounter for other antenatal screening follow-up: Secondary | ICD-10-CM

## 2019-07-13 DIAGNOSIS — O0990 Supervision of high risk pregnancy, unspecified, unspecified trimester: Secondary | ICD-10-CM | POA: Diagnosis present

## 2019-07-13 DIAGNOSIS — O24410 Gestational diabetes mellitus in pregnancy, diet controlled: Secondary | ICD-10-CM

## 2019-07-13 DIAGNOSIS — O3662X0 Maternal care for excessive fetal growth, second trimester, not applicable or unspecified: Secondary | ICD-10-CM | POA: Insufficient documentation

## 2019-07-13 DIAGNOSIS — R42 Dizziness and giddiness: Secondary | ICD-10-CM | POA: Insufficient documentation

## 2019-07-13 DIAGNOSIS — O43193 Other malformation of placenta, third trimester: Secondary | ICD-10-CM | POA: Diagnosis not present

## 2019-07-13 DIAGNOSIS — O43199 Other malformation of placenta, unspecified trimester: Secondary | ICD-10-CM | POA: Diagnosis not present

## 2019-07-13 DIAGNOSIS — O99210 Obesity complicating pregnancy, unspecified trimester: Secondary | ICD-10-CM | POA: Diagnosis not present

## 2019-07-13 DIAGNOSIS — Z3A36 36 weeks gestation of pregnancy: Secondary | ICD-10-CM

## 2019-07-13 DIAGNOSIS — O09293 Supervision of pregnancy with other poor reproductive or obstetric history, third trimester: Secondary | ICD-10-CM

## 2019-07-13 DIAGNOSIS — Z8759 Personal history of other complications of pregnancy, childbirth and the puerperium: Secondary | ICD-10-CM

## 2019-07-13 DIAGNOSIS — O3483 Maternal care for other abnormalities of pelvic organs, third trimester: Secondary | ICD-10-CM

## 2019-07-13 DIAGNOSIS — O3663X0 Maternal care for excessive fetal growth, third trimester, not applicable or unspecified: Secondary | ICD-10-CM

## 2019-07-13 DIAGNOSIS — E079 Disorder of thyroid, unspecified: Secondary | ICD-10-CM

## 2019-07-13 DIAGNOSIS — O322XX0 Maternal care for transverse and oblique lie, not applicable or unspecified: Secondary | ICD-10-CM

## 2019-07-14 ENCOUNTER — Other Ambulatory Visit: Payer: Self-pay | Admitting: *Deleted

## 2019-07-14 DIAGNOSIS — O3663X0 Maternal care for excessive fetal growth, third trimester, not applicable or unspecified: Secondary | ICD-10-CM

## 2019-07-19 ENCOUNTER — Encounter: Payer: Medicaid Other | Admitting: Family Medicine

## 2019-07-20 ENCOUNTER — Other Ambulatory Visit: Payer: Self-pay

## 2019-07-20 ENCOUNTER — Ambulatory Visit: Payer: Medicaid Other | Attending: Obstetrics and Gynecology

## 2019-07-20 ENCOUNTER — Ambulatory Visit: Payer: Medicaid Other | Admitting: *Deleted

## 2019-07-20 DIAGNOSIS — Z3A37 37 weeks gestation of pregnancy: Secondary | ICD-10-CM

## 2019-07-20 DIAGNOSIS — O3663X0 Maternal care for excessive fetal growth, third trimester, not applicable or unspecified: Secondary | ICD-10-CM

## 2019-07-20 DIAGNOSIS — O0990 Supervision of high risk pregnancy, unspecified, unspecified trimester: Secondary | ICD-10-CM | POA: Diagnosis not present

## 2019-07-20 DIAGNOSIS — O43199 Other malformation of placenta, unspecified trimester: Secondary | ICD-10-CM | POA: Insufficient documentation

## 2019-07-20 DIAGNOSIS — O24410 Gestational diabetes mellitus in pregnancy, diet controlled: Secondary | ICD-10-CM | POA: Diagnosis not present

## 2019-07-20 DIAGNOSIS — O3662X0 Maternal care for excessive fetal growth, second trimester, not applicable or unspecified: Secondary | ICD-10-CM

## 2019-07-20 DIAGNOSIS — O3480 Maternal care for other abnormalities of pelvic organs, unspecified trimester: Secondary | ICD-10-CM

## 2019-07-20 DIAGNOSIS — O99213 Obesity complicating pregnancy, third trimester: Secondary | ICD-10-CM

## 2019-07-20 DIAGNOSIS — R42 Dizziness and giddiness: Secondary | ICD-10-CM | POA: Diagnosis not present

## 2019-07-20 DIAGNOSIS — O99283 Endocrine, nutritional and metabolic diseases complicating pregnancy, third trimester: Secondary | ICD-10-CM

## 2019-07-20 DIAGNOSIS — E079 Disorder of thyroid, unspecified: Secondary | ICD-10-CM

## 2019-07-20 DIAGNOSIS — Z419 Encounter for procedure for purposes other than remedying health state, unspecified: Secondary | ICD-10-CM | POA: Diagnosis not present

## 2019-07-20 DIAGNOSIS — Z362 Encounter for other antenatal screening follow-up: Secondary | ICD-10-CM

## 2019-07-20 DIAGNOSIS — O09293 Supervision of pregnancy with other poor reproductive or obstetric history, third trimester: Secondary | ICD-10-CM

## 2019-07-26 ENCOUNTER — Ambulatory Visit: Payer: Medicaid Other | Attending: Obstetrics and Gynecology

## 2019-07-26 ENCOUNTER — Ambulatory Visit: Payer: Medicaid Other | Admitting: *Deleted

## 2019-07-26 ENCOUNTER — Other Ambulatory Visit: Payer: Self-pay

## 2019-07-26 DIAGNOSIS — O24410 Gestational diabetes mellitus in pregnancy, diet controlled: Secondary | ICD-10-CM

## 2019-07-26 DIAGNOSIS — O43199 Other malformation of placenta, unspecified trimester: Secondary | ICD-10-CM | POA: Diagnosis not present

## 2019-07-26 DIAGNOSIS — E039 Hypothyroidism, unspecified: Secondary | ICD-10-CM | POA: Diagnosis not present

## 2019-07-26 DIAGNOSIS — O99283 Endocrine, nutritional and metabolic diseases complicating pregnancy, third trimester: Secondary | ICD-10-CM

## 2019-07-26 DIAGNOSIS — O3662X0 Maternal care for excessive fetal growth, second trimester, not applicable or unspecified: Secondary | ICD-10-CM | POA: Diagnosis not present

## 2019-07-26 DIAGNOSIS — O09293 Supervision of pregnancy with other poor reproductive or obstetric history, third trimester: Secondary | ICD-10-CM | POA: Diagnosis not present

## 2019-07-26 DIAGNOSIS — O3483 Maternal care for other abnormalities of pelvic organs, third trimester: Secondary | ICD-10-CM | POA: Diagnosis not present

## 2019-07-26 DIAGNOSIS — Z3A38 38 weeks gestation of pregnancy: Secondary | ICD-10-CM | POA: Diagnosis not present

## 2019-07-26 DIAGNOSIS — O99213 Obesity complicating pregnancy, third trimester: Secondary | ICD-10-CM | POA: Diagnosis not present

## 2019-07-26 DIAGNOSIS — O0990 Supervision of high risk pregnancy, unspecified, unspecified trimester: Secondary | ICD-10-CM

## 2019-07-26 DIAGNOSIS — R42 Dizziness and giddiness: Secondary | ICD-10-CM

## 2019-07-26 DIAGNOSIS — O3663X0 Maternal care for excessive fetal growth, third trimester, not applicable or unspecified: Secondary | ICD-10-CM

## 2019-07-26 DIAGNOSIS — E079 Disorder of thyroid, unspecified: Secondary | ICD-10-CM | POA: Diagnosis not present

## 2019-07-26 DIAGNOSIS — O3660X0 Maternal care for excessive fetal growth, unspecified trimester, not applicable or unspecified: Secondary | ICD-10-CM | POA: Diagnosis not present

## 2019-07-31 ENCOUNTER — Ambulatory Visit (INDEPENDENT_AMBULATORY_CARE_PROVIDER_SITE_OTHER): Payer: Medicaid Other | Admitting: Obstetrics and Gynecology

## 2019-07-31 ENCOUNTER — Other Ambulatory Visit (HOSPITAL_COMMUNITY)
Admission: RE | Admit: 2019-07-31 | Discharge: 2019-07-31 | Disposition: A | Discharge status: Home / Self Care | Payer: Medicaid Other | Source: Ambulatory Visit | Attending: Obstetrics and Gynecology | Admitting: Obstetrics and Gynecology

## 2019-07-31 ENCOUNTER — Other Ambulatory Visit: Payer: Self-pay

## 2019-07-31 ENCOUNTER — Other Ambulatory Visit: Payer: Self-pay | Admitting: General Practice

## 2019-07-31 VITALS — BP 135/73 | HR 85 | Wt 257.1 lb

## 2019-07-31 DIAGNOSIS — M51379 Other intervertebral disc degeneration, lumbosacral region without mention of lumbar back pain or lower extremity pain: Secondary | ICD-10-CM

## 2019-07-31 DIAGNOSIS — Z3A38 38 weeks gestation of pregnancy: Secondary | ICD-10-CM

## 2019-07-31 DIAGNOSIS — O43193 Other malformation of placenta, third trimester: Secondary | ICD-10-CM

## 2019-07-31 DIAGNOSIS — O0990 Supervision of high risk pregnancy, unspecified, unspecified trimester: Secondary | ICD-10-CM | POA: Diagnosis not present

## 2019-07-31 DIAGNOSIS — O0993 Supervision of high risk pregnancy, unspecified, third trimester: Secondary | ICD-10-CM

## 2019-07-31 DIAGNOSIS — O43199 Other malformation of placenta, unspecified trimester: Secondary | ICD-10-CM

## 2019-07-31 DIAGNOSIS — O24410 Gestational diabetes mellitus in pregnancy, diet controlled: Secondary | ICD-10-CM

## 2019-07-31 DIAGNOSIS — M5136 Other intervertebral disc degeneration, lumbar region: Secondary | ICD-10-CM

## 2019-07-31 DIAGNOSIS — M5137 Other intervertebral disc degeneration, lumbosacral region: Secondary | ICD-10-CM

## 2019-07-31 DIAGNOSIS — O3663X0 Maternal care for excessive fetal growth, third trimester, not applicable or unspecified: Secondary | ICD-10-CM

## 2019-07-31 DIAGNOSIS — Z6841 Body Mass Index (BMI) 40.0 and over, adult: Secondary | ICD-10-CM

## 2019-07-31 DIAGNOSIS — O99891 Other specified diseases and conditions complicating pregnancy: Secondary | ICD-10-CM

## 2019-07-31 DIAGNOSIS — O0933 Supervision of pregnancy with insufficient antenatal care, third trimester: Secondary | ICD-10-CM

## 2019-07-31 NOTE — Progress Notes (Signed)
   PRENATAL VISIT NOTE  Subjective:  Kimberly [name] is a 29 y.o. G3P1011 at [redacted]w[redacted]d being seen today for ongoing prenatal care.  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  Patient doing well with no acute concerns today. She reports no complaints.  Contractions: Irritability. Vag. Bleeding: None.  Movement: Present. Denies leaking of fluid.   The following portions of the patient's history were reviewed and updated as appropriate: allergies, current medications, past family history, past medical history, past social history, past surgical history and problem list. Problem list updated.  Objective:   Vitals:   07/31/19 0831  BP: 135/73  Pulse: 85  Weight: 257 lb 1.6 oz (116.6 kg)    Fetal Status: Fetal Heart Rate (bpm): 143   Movement: Present     General:  Alert, oriented and cooperative. Patient is in no acute distress.  Skin: Skin is warm and dry. No rash noted.   Cardiovascular: Normal heart rate noted  Respiratory: Normal respiratory effort, no problems with respiration noted  Abdomen: Soft, obese,gravid, appropriate for gestational age.  Pain/Pressure: Present     Pelvic: Cervical exam performed       cvx 0.5/60/-2  Extremities: Normal range of motion.  Edema: None  Mental  Status:  Normal mood and affect. Normal behavior. Normal judgment and thought content.   Assessment and Plan:  Pregnancy: G3P1011 at [redacted]w[redacted]d  1. Supervision of high risk pregnancy, antepartum Pt has not been seen since mid-May in person.  She has been present for her BPP and growth scans.   Cultures taken today.  Due to gestational diabetes and possibility of LGA fetus.  IOL scheduled for 39 weeks.  MFM agrees.    - GC/Chlamydia probe amp ([place])not at [facility] - Strep Gp B Culture+Rflx  2. Degenerative disc disease at L5-S1 level  3. Excessive fetal growth affecting management of pregnancy in third trimester, single or unspecified fetus Discussed in detail risk of shoulder dystocia including oxygen deprivation, nerve damage and general fetal morbidity/mortality.   4. Marginal insertion of umbilical cord affecting management of mother   5. BMI 45.0-49.9, adult ([place])   6. Limited prenatal care, third trimester   Term labor symptoms and general obstetric precautions including but not limited to vaginal bleeding, contractions, leaking of fluid and fetal movement were reviewed in detail with the patient.  Please refer to After Visit Summary for other counseling recommendations.   Return for shceduling pt for induction.   Lynnda Shields, MD

## 2019-07-31 NOTE — Patient Instructions (Signed)

## 2019-08-01 ENCOUNTER — Inpatient Hospital Stay (HOSPITAL_COMMUNITY): Payer: Medicaid Other

## 2019-08-01 ENCOUNTER — Encounter (HOSPITAL_COMMUNITY): Payer: Self-pay | Admitting: Obstetrics and Gynecology

## 2019-08-01 ENCOUNTER — Inpatient Hospital Stay (HOSPITAL_COMMUNITY): Payer: Medicaid Other | Admitting: Anesthesiology

## 2019-08-01 ENCOUNTER — Inpatient Hospital Stay (HOSPITAL_COMMUNITY)
Admission: AD | Admit: 2019-08-01 | Discharge: 2019-08-05 | DRG: 786 | Disposition: A | Discharge status: Home / Self Care | Payer: Medicaid Other | Attending: Obstetrics and Gynecology | Admitting: Obstetrics and Gynecology

## 2019-08-01 DIAGNOSIS — Z20822 Contact with and (suspected) exposure to covid-19: Secondary | ICD-10-CM | POA: Diagnosis present

## 2019-08-01 DIAGNOSIS — O9902 Anemia complicating childbirth: Secondary | ICD-10-CM | POA: Diagnosis present

## 2019-08-01 DIAGNOSIS — O1002 Pre-existing essential hypertension complicating childbirth: Secondary | ICD-10-CM | POA: Diagnosis not present

## 2019-08-01 DIAGNOSIS — G8929 Other chronic pain: Secondary | ICD-10-CM | POA: Diagnosis present

## 2019-08-01 DIAGNOSIS — O99214 Obesity complicating childbirth: Secondary | ICD-10-CM | POA: Diagnosis present

## 2019-08-01 DIAGNOSIS — O3660X0 Maternal care for excessive fetal growth, unspecified trimester, not applicable or unspecified: Secondary | ICD-10-CM | POA: Diagnosis present

## 2019-08-01 DIAGNOSIS — O411230 Chorioamnionitis, third trimester, not applicable or unspecified: Secondary | ICD-10-CM | POA: Diagnosis present

## 2019-08-01 DIAGNOSIS — E039 Hypothyroidism, unspecified: Secondary | ICD-10-CM | POA: Diagnosis present

## 2019-08-01 DIAGNOSIS — F32A Depression, unspecified: Secondary | ICD-10-CM | POA: Diagnosis present

## 2019-08-01 DIAGNOSIS — F319 Bipolar disorder, unspecified: Secondary | ICD-10-CM | POA: Diagnosis present

## 2019-08-01 DIAGNOSIS — M549 Dorsalgia, unspecified: Secondary | ICD-10-CM | POA: Diagnosis present

## 2019-08-01 DIAGNOSIS — Z880 Allergy status to penicillin: Secondary | ICD-10-CM

## 2019-08-01 DIAGNOSIS — F419 Anxiety disorder, unspecified: Secondary | ICD-10-CM | POA: Diagnosis present

## 2019-08-01 DIAGNOSIS — O43123 Velamentous insertion of umbilical cord, third trimester: Secondary | ICD-10-CM | POA: Diagnosis present

## 2019-08-01 DIAGNOSIS — O8604 Sepsis following an obstetrical procedure: Secondary | ICD-10-CM | POA: Diagnosis not present

## 2019-08-01 DIAGNOSIS — M5136 Other intervertebral disc degeneration, lumbar region: Secondary | ICD-10-CM | POA: Diagnosis present

## 2019-08-01 DIAGNOSIS — O24420 Gestational diabetes mellitus in childbirth, diet controlled: Principal | ICD-10-CM | POA: Diagnosis present

## 2019-08-01 DIAGNOSIS — E071 Dyshormogenetic goiter: Secondary | ICD-10-CM | POA: Diagnosis present

## 2019-08-01 DIAGNOSIS — O411290 Chorioamnionitis, unspecified trimester, not applicable or unspecified: Secondary | ICD-10-CM

## 2019-08-01 DIAGNOSIS — O24410 Gestational diabetes mellitus in pregnancy, diet controlled: Secondary | ICD-10-CM | POA: Diagnosis present

## 2019-08-01 DIAGNOSIS — I10 Essential (primary) hypertension: Secondary | ICD-10-CM | POA: Diagnosis present

## 2019-08-01 DIAGNOSIS — F329 Major depressive disorder, single episode, unspecified: Secondary | ICD-10-CM | POA: Diagnosis present

## 2019-08-01 DIAGNOSIS — Z283 Underimmunization status: Secondary | ICD-10-CM

## 2019-08-01 DIAGNOSIS — O99892 Other specified diseases and conditions complicating childbirth: Secondary | ICD-10-CM | POA: Diagnosis not present

## 2019-08-01 DIAGNOSIS — O99284 Endocrine, nutritional and metabolic diseases complicating childbirth: Secondary | ICD-10-CM | POA: Diagnosis not present

## 2019-08-01 DIAGNOSIS — O3663X0 Maternal care for excessive fetal growth, third trimester, not applicable or unspecified: Secondary | ICD-10-CM | POA: Diagnosis present

## 2019-08-01 DIAGNOSIS — Z3A39 39 weeks gestation of pregnancy: Secondary | ICD-10-CM | POA: Diagnosis not present

## 2019-08-01 DIAGNOSIS — Z98891 History of uterine scar from previous surgery: Secondary | ICD-10-CM

## 2019-08-01 DIAGNOSIS — O99344 Other mental disorders complicating childbirth: Secondary | ICD-10-CM | POA: Diagnosis present

## 2019-08-01 DIAGNOSIS — O99213 Obesity complicating pregnancy, third trimester: Secondary | ICD-10-CM | POA: Diagnosis present

## 2019-08-01 DIAGNOSIS — O09899 Supervision of other high risk pregnancies, unspecified trimester: Secondary | ICD-10-CM

## 2019-08-01 DIAGNOSIS — D509 Iron deficiency anemia, unspecified: Secondary | ICD-10-CM | POA: Diagnosis present

## 2019-08-01 DIAGNOSIS — O322XX0 Maternal care for transverse and oblique lie, not applicable or unspecified: Secondary | ICD-10-CM | POA: Diagnosis not present

## 2019-08-01 DIAGNOSIS — A419 Sepsis, unspecified organism: Secondary | ICD-10-CM

## 2019-08-01 LAB — CBC
HCT: 31.9 % — ABNORMAL LOW (ref 36.0–46.0)
Hemoglobin: 9.3 g/dL — ABNORMAL LOW (ref 12.0–15.0)
MCH: 23.2 pg — ABNORMAL LOW (ref 26.0–34.0)
MCHC: 29.2 g/dL — ABNORMAL LOW (ref 30.0–36.0)
MCV: 79.6 fL — ABNORMAL LOW (ref 80.0–100.0)
Platelets: 334 10*3/uL (ref 150–400)
RBC: 4.01 MIL/uL (ref 3.87–5.11)
RDW: 16.5 % — ABNORMAL HIGH (ref 11.5–15.5)
WBC: 12.4 10*3/uL — ABNORMAL HIGH (ref 4.0–10.5)
nRBC: 0 % (ref 0.0–0.2)

## 2019-08-01 LAB — RPR: RPR Ser Ql: NONREACTIVE

## 2019-08-01 LAB — GC/CHLAMYDIA PROBE AMP (~~LOC~~) NOT AT ARMC
Chlamydia: NEGATIVE
Comment: NEGATIVE
Comment: NORMAL
Neisseria Gonorrhea: NEGATIVE

## 2019-08-01 LAB — COMPREHENSIVE METABOLIC PANEL
ALT: 9 U/L (ref 0–44)
AST: 16 U/L (ref 15–41)
Albumin: 2.5 g/dL — ABNORMAL LOW (ref 3.5–5.0)
Alkaline Phosphatase: 174 U/L — ABNORMAL HIGH (ref 38–126)
Anion gap: 12 (ref 5–15)
BUN: 7 mg/dL (ref 6–20)
CO2: 19 mmol/L — ABNORMAL LOW (ref 22–32)
Calcium: 8.8 mg/dL — ABNORMAL LOW (ref 8.9–10.3)
Chloride: 105 mmol/L (ref 98–111)
Creatinine, Ser: 0.43 mg/dL — ABNORMAL LOW (ref 0.44–1.00)
GFR calc Af Amer: >60 mL/min (ref >60)
GFR calc non Af Amer: >60 mL/min (ref >60)
Glucose, Bld: 87 mg/dL (ref 70–99)
Potassium: 3.7 mmol/L (ref 3.5–5.1)
Sodium: 136 mmol/L (ref 135–145)
Total Bilirubin: 0.8 mg/dL (ref 0.3–1.2)
Total Protein: 6 g/dL — ABNORMAL LOW (ref 6.5–8.1)

## 2019-08-01 LAB — GLUCOSE, CAPILLARY
Glucose-Capillary: 94 mg/dL (ref 70–99)
Glucose-Capillary: 78 mg/dL (ref 70–99)
Glucose-Capillary: 76 mg/dL (ref 70–99)
Glucose-Capillary: 70 mg/dL (ref 70–99)

## 2019-08-01 LAB — T4, FREE: Free T4: 0.81 ng/dL (ref 0.61–1.12)

## 2019-08-01 LAB — SARS CORONAVIRUS 2 BY RT PCR (HOSPITAL ORDER, PERFORMED IN ~~LOC~~ HOSPITAL LAB): SARS Coronavirus 2: NEGATIVE

## 2019-08-01 LAB — GROUP B STREP BY PCR: Group B strep by PCR: NEGATIVE

## 2019-08-01 LAB — TSH: TSH: 5.593 u[IU]/mL — ABNORMAL HIGH (ref 0.350–4.500)

## 2019-08-01 MED ORDER — FENTANYL CITRATE (PF) 100 MCG/2ML IJ SOLN
50.0000 ug | INTRAMUSCULAR | Status: DC | PRN
  Administered 2019-08-01 (×3): 100 ug via INTRAVENOUS
  Filled 2019-08-01 (×4): qty 2

## 2019-08-01 MED ORDER — OXYTOCIN-SODIUM CHLORIDE 30-0.9 UT/500ML-% IV SOLN: 2.5000 [IU]/h | INTRAVENOUS | Status: DC

## 2019-08-01 MED ORDER — LIDOCAINE-EPINEPHRINE (PF) 2 %-1:200000 IJ SOLN
INTRAMUSCULAR | Status: DC | PRN
  Administered 2019-08-01: 3 mL via EPIDURAL
  Administered 2019-08-01: 2 mL via EPIDURAL
  Administered 2019-08-02 (×2): 5 mL via EPIDURAL

## 2019-08-01 MED ORDER — TERBUTALINE SULFATE 1 MG/ML IJ SOLN
0.2500 mg | Freq: Once | INTRAMUSCULAR | Status: AC | PRN
  Administered 2019-08-02: 0.25 mg via SUBCUTANEOUS
  Filled 2019-08-01: qty 1

## 2019-08-01 MED ORDER — SODIUM CHLORIDE (PF) 0.9 % IJ SOLN
INTRAMUSCULAR | Status: DC | PRN
  Administered 2019-08-01: 12 mL/h via EPIDURAL

## 2019-08-01 MED ORDER — PHENYLEPHRINE 40 MCG/ML (10ML) SYRINGE FOR IV PUSH (FOR BLOOD PRESSURE SUPPORT)
80.0000 ug | PREFILLED_SYRINGE | INTRAVENOUS | Status: AC | PRN
  Administered 2019-08-01 – 2019-08-02 (×3): 80 ug via INTRAVENOUS

## 2019-08-01 MED ORDER — LIDOCAINE HCL (PF) 1 % IJ SOLN: 30.0000 mL | INTRAMUSCULAR | Status: DC | PRN

## 2019-08-01 MED ORDER — OXYTOCIN BOLUS FROM INFUSION: 333.0000 mL | Freq: Once | INTRAVENOUS | Status: DC

## 2019-08-01 MED ORDER — CLINDAMYCIN PHOSPHATE 900 MG/50ML IV SOLN
900.0000 mg | Freq: Three times a day (TID) | INTRAVENOUS | Status: DC
  Administered 2019-08-01: 900 mg via INTRAVENOUS
  Filled 2019-08-01: qty 50

## 2019-08-01 MED ORDER — DIPHENHYDRAMINE HCL 50 MG/ML IJ SOLN: 12.5000 mg | INTRAMUSCULAR | Status: DC | PRN

## 2019-08-01 MED ORDER — ONDANSETRON HCL 4 MG/2ML IJ SOLN: 4.0000 mg | Freq: Four times a day (QID) | INTRAMUSCULAR | Status: DC | PRN

## 2019-08-01 MED ORDER — LACTATED RINGERS IV SOLN: 500.0000 mL | Freq: Once | INTRAVENOUS | Status: DC

## 2019-08-01 MED ORDER — LEVOTHYROXINE SODIUM 25 MCG PO TABS
150.0000 ug | ORAL_TABLET | Freq: Every day | ORAL | Status: DC
  Administered 2019-08-01: 150 ug via ORAL
  Filled 2019-08-01 (×2): qty 1

## 2019-08-01 MED ORDER — FENTANYL-BUPIVACAINE-NACL 0.5-0.125-0.9 MG/250ML-% EP SOLN
12.0000 mL/h | EPIDURAL | Status: DC | PRN
  Filled 2019-08-01: qty 250

## 2019-08-01 MED ORDER — MISOPROSTOL 25 MCG QUARTER TABLET
25.0000 ug | ORAL_TABLET | ORAL | Status: DC | PRN
  Administered 2019-08-01: 25 ug via VAGINAL
  Filled 2019-08-01: qty 1

## 2019-08-01 MED ORDER — MISOPROSTOL 50MCG HALF TABLET
50.0000 ug | ORAL_TABLET | ORAL | Status: DC | PRN
  Administered 2019-08-02: 800 ug via BUCCAL

## 2019-08-01 MED ORDER — EPHEDRINE 5 MG/ML INJ: 10.0000 mg | INTRAVENOUS | Status: DC | PRN

## 2019-08-01 MED ORDER — PHENYLEPHRINE 40 MCG/ML (10ML) SYRINGE FOR IV PUSH (FOR BLOOD PRESSURE SUPPORT)
80.0000 ug | PREFILLED_SYRINGE | INTRAVENOUS | Status: DC | PRN
  Administered 2019-08-02 (×2): 200 ug via INTRAVENOUS
  Filled 2019-08-01 (×2): qty 10

## 2019-08-01 MED ORDER — OXYTOCIN-SODIUM CHLORIDE 30-0.9 UT/500ML-% IV SOLN
1.0000 m[IU]/min | INTRAVENOUS | Status: DC
  Administered 2019-08-01: 2 m[IU]/min via INTRAVENOUS
  Filled 2019-08-01: qty 500

## 2019-08-01 MED ORDER — LACTATED RINGERS IV SOLN
500.0000 mL | INTRAVENOUS | Status: DC | PRN
  Administered 2019-08-01: 1000 mL via INTRAVENOUS
  Administered 2019-08-02: 500 mL via INTRAVENOUS
  Administered 2019-08-02: 1000 mL via INTRAVENOUS
  Administered 2019-08-02: 500 mL via INTRAVENOUS

## 2019-08-01 MED ORDER — LACTATED RINGERS IV SOLN: INTRAVENOUS | Status: DC

## 2019-08-01 NOTE — Progress Notes (Signed)
Labor Progress Note Kimberly [name] is a 29 y.o. G3P1011 at [redacted]w[redacted]d presented for IOL for GDMA1. S: Feeling stronger ctx; coping well.   O:  BP 125/74   Pulse (!) 130   Temp 97.7 F (36.5 C)   Ht 5\' 4"  (1.626 m)   Wt 116.8 kg   LMP 10/25/2018 (Exact Date)   BMI 44.20 kg/m  EFM: 145, moderate variability, pos accels, no decels, reactive IUPC: q2-32m  CVE: Dilation: 4 Effacement (%): 50 Cervical Position: Middle Station: -3 Presentation: Vertex Exam by:: m wilkins rnc   A&P: 29 y.o. [id number] [redacted]w[redacted]d here for IOL for GDMA1. #Labor: S/p FB (see prior note by Dr. Nehemiah Settle) and Cytotec x1. S/p AROM with IUPC placement. Pit restarted at 1800 after prolonged decel. Anticipate SVD. #Pain: per patient request #FWB: Cat I #GBS negative #GDMA1: last glucose 78 #Hypothyroid: cont Synthroid; TSH mildly elevated but Free T4 WNL #cHTN: normal range pressures currently, CMP WNL  Chauncey Mann, MD 7:22 PM

## 2019-08-01 NOTE — Anesthesia Preprocedure Evaluation (Addendum)
Anesthesia Evaluation  Patient identified by MRN, date of birth, ID band Patient awake    Reviewed: Allergy & Precautions, NPO status , Patient's Chart, lab work & pertinent test results  History of Anesthesia Complications Negative for: history of anesthetic complications  Airway Mallampati: III  TM Distance: >3 FB Neck ROM: Full    Dental no notable dental hx. (+) Teeth Intact, Dental Advisory Given   Pulmonary asthma ,    Pulmonary exam normal breath sounds clear to auscultation       Cardiovascular hypertension, Normal cardiovascular exam Rhythm:Regular Rate:Normal     Neuro/Psych  Headaches, PSYCHIATRIC DISORDERS Anxiety Depression Schizophrenia    GI/Hepatic negative GI ROS, Neg liver ROS,   Endo/Other  diabetes, GestationalHypothyroidism Morbid obesity (BMI 44)  Renal/GU negative Renal ROS  negative genitourinary   Musculoskeletal  (+) Arthritis ,   Abdominal   Peds  Hematology  (+) Blood dyscrasia (Hgb 9.3), anemia ,   Anesthesia Other Findings   Reproductive/Obstetrics (+) Pregnancy                            Anesthesia Physical Anesthesia Plan  ASA: III and emergent  Anesthesia Plan: Epidural   Post-op Pain Management:    Induction:   PONV Risk Score and Plan: 4 or greater and Treatment may vary due to age or medical condition and Dexamethasone  Airway Management Planned: Natural Airway  Additional Equipment:   Intra-op Plan:   Post-operative Plan:   Informed Consent: I have reviewed the patients History and Physical, chart, labs and discussed the procedure including the risks, benefits and alternatives for the proposed anesthesia with the patient or authorized representative who has indicated his/her understanding and acceptance.       Plan Discussed with: Anesthesiologist  Anesthesia Plan Comments: (Patient identified. Risks, benefits, options discussed with  patient including but not limited to bleeding, infection, nerve damage, paralysis, failed block, incomplete pain control, headache, blood pressure changes, nausea, vomiting, reactions to medication, itching, and post partum back pain. Confirmed with bedside nurse the patient's most recent platelet count. Confirmed with the patient that they are not taking any anticoagulation, have any bleeding history or any family history of bleeding disorders. Patient expressed understanding and wishes to proceed. All questions were answered.   Epidural to be used for urgent C/S (NRFHT). Daiva Huge, MD)       Anesthesia Quick Evaluation

## 2019-08-01 NOTE — Progress Notes (Signed)
Kimberly [name] is a 29 y.o. G3P1011 at [redacted]w[redacted]d admitted for induction of labor due to [facility].  Subjective: Sitting up in bed, feeling strong contractions and is coping well.  Objective: BP 121/74    Pulse (!) 117    Temp 97.7 F (36.5 C)    Resp 18    Ht 5\' 4"  (1.626 m)    Wt 116.8 kg    LMP 10/25/2018 (Exact Date)    BMI 44.20 kg/m  No intake/output data recorded.  FHT:  FHR: 130 bpm, variability: moderate,  accelerations:  Present,  decelerations:  Absent UC:   regular, every 2-3 minutes, MVUs > 250  SVE:   Dilation: 4 Effacement (%): 50 Station: -3 Exam by:: Lenise Herald RN  Pitocin @ 4 mu/min  Labs: Lab Results  Component Value Date   WBC 12.4 (H) 08/01/2019   HGB 9.3 (L) 08/01/2019   HCT 31.9 (L) 08/01/2019   MCV 79.6 (L) 08/01/2019   PLT 334 08/01/2019    Assessment / Plan: 29 y.o. G3P1011 [redacted]w[redacted]d here for IOL for GDMA1. #Labor: S/p FB (see prior note by Dr. Nehemiah Settle) and Cytotec x1. S/p AROM with IUPC placement. Pit restarted at 1800 after prolonged decel, pit now at 20mu/min with adequate MVUs. Anticipate SVD. #Pain: per patient request #FWB: Cat 1 #GBS negative #GDMA1: last glucose 70 #Hypothyroid: cont Synthroid; TSH mildly elevated but Free T4 WNL #cHTN: normal range pressures currently, CMP WNL  [place] DO PGY1, Family Medicine Resident 08/01/2019, 10:10 PM

## 2019-08-01 NOTE — Progress Notes (Signed)
Labor Progress Note Kimberly [name] is a 29 y.o. G3P1011 at [redacted]w[redacted]d presented for IOL for GDMA1. S: Feeling few ctx. No more bleeding.   O:  BP 119/86   Pulse (!) 101   Temp 97.7 F (36.5 C)   Ht 5\' 4"  (1.626 m)   Wt 116.8 kg   LMP 10/25/2018 (Exact Date)   BMI 44.20 kg/m  EFM: 145, moderate variability, pos accels, no decels, reactive TOCO: difficult to trace currently.   CVE: Dilation: 2.5 Effacement (%): 50 Cervical Position: Middle Station: -2 Presentation: Vertex Exam by:: stinson    A&P: 29 y.o. G3P1011 [redacted]w[redacted]d here for IOL for GDMA1. #Labor: See note by Dr. Nehemiah Settle, FB removed due to bleeding. PRBC's placed on hold just in case. Starting Hgb 9.3. No bleeding currently and FHR reassuring throughout. S/p Cytotec x1. Pitocin initiated. Consider AROM when better applied. Anticipate SVD. #Pain: per patient request #FWB: Cat I #GBS negative #GDMA1: last glucose 87; pending repeat now #Hypothyroid: cont Synthroid; repeat labs pending #cHTN: normal range pressures currently, CMP WNL  Chauncey Mann, MD 12:30 PM

## 2019-08-01 NOTE — Anesthesia Procedure Notes (Signed)

## 2019-08-01 NOTE — H&P (Addendum)
OBSTETRIC ADMISSION HISTORY AND PHYSICAL  SCARLET ABAD is a 29 y.o. female G3P1011 with IUP at 34w0dby 16 wk [place] I[place] for GDMA1 and LGA. She reports +FMs, No LOF, no VB, no blurry vision, headaches or peripheral edema, and RUQ pain.  She plans on breast feeding. She requests POPs for birth control. She [place]  [place]--->  Estimated Date of Delivery: 08/08/19  Sono: 6/24   '@[redacted]w[redacted]d' , CWD, normal anatomy, transverse presentation, posterior placental lie, 3821g, >99% EFW  Prenatal History/Complications: GDMA1; inadequate PNC in third trimester Transverse lie at 36 weeks LGA cHTN not on medication Marginal cord insertion Maternal Obesity Degenerative Disc disease of mother Maternal hypothyroidism  Past Medical History: 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    Iron deficiency anemia    Migraines    "weekly" (11/16/2012)   Oligomenorrhea 03/01/2011   Ovarian cyst    Pregnancy induced hypertension    Schizophrenia ([place]    "moderate" (11/16/2012)   Scoliosis     Past Surgical History: Past Surgical History:  Procedure Laterality Date   LAPAROSCOPIC OVARIAN CYSTECTOMY Left 02/11/2016   Procedure: LAPAROSCOPIC OVARIAN CYSTECTOMY;  Surgeon: MEmily Filbert MD;  [facility];  Service: Gynecology;  Laterality: Left;   UPPER GI ENDOSCOPY  ~ 2006; ~2008    Obstetrical History: OB History     Gravida  3   Para  1   Term  1   Preterm      AB  1   Living  1      SAB  1   TAB      Ectopic      Multiple  0   Live Births  1           Social  History: Social History   Socioeconomic History   Marital status: Single    Spouse name: Not on file   Number of children: 1   Years of education: Not on file   Highest education level: High school graduate  Occupational History   Not on file  Tobacco Use   Smoking status: Never Smoker   Smokeless tobacco: Never Used  Vaping Use   Vaping Use: Never used  Substance and Sexual Activity   Alcohol use: Not Currently    Comment: very rare   Drug use: No   Sexual activity: Yes    Birth control/protection: None  Other Topics Concern   Not on file  Social History Narrative   Not on file   Social Determinants of Health   Financial Resource Strain:    Difficulty of Paying Living Expenses:   Food Insecurity: No Food Insecurity   Worried About Running Out of Food in the Last Year: Never true   Ran Out of Food in the Last Year: Never true  Transportation Needs: No Transportation Needs   Lack of Transportation (Medical): No   Lack of Transportation (Non-Medical): No  Physical Activity:    Days of Exercise per Week:  Minutes of Exercise per Session:   Stress:    Feeling of Stress :   Social Connections:    Frequency of Communication with Friends and Family:    Frequency of Social Gatherings with Friends and Family:    Attends Religious Services:    Active Member of Clubs or Organizations:    Attends Music therapist:    Marital Status:     Family History: Family History  Problem Relation Age of Onset   Diabetes Mother    Hypertension Mother    Vision loss Mother    Hypertension Father    Hyperlipidemia Father    Thyroid disease Maternal Grandmother     Allergies: Allergies  Allergen Reactions   Cold & Cough Daytime [Acetaminophen-Dm] Shortness Of Breath    Could not tolerate- orange liquid, may have contained Pseudoephedrine- "I could not tolerate"   Sulfa Antibiotics Swelling   Zofran [Ondansetron] Other (See Comments)    Muscle Spasms   Abilify  [Aripiprazole] Other (See Comments)    Lethargy, unable to function   Amoxicillin-Pot Clavulanate Other (See Comments)    Stomach pains Has patient had a PCN reaction causing immediate rash, facial/tongue/throat swelling, SOB or lightheadedness with hypotension: No Has patient had a PCN reaction causing severe rash involving mucus membranes or skin necrosis: No Has patient had a PCN reaction that required hospitalization No Has patient had a PCN reaction occurring within the last 10 years: Yes If all of the above answers are "NO", then may proceed with Cephalosporin use.    Cephalexin Other (See Comments)    Stomach pains Pt taking Cefadroxil as outpatient   Ferrous Sulfate Nausea And Vomiting   Latex Swelling and Other (See Comments)    Burning of skin, too   Tape Other (See Comments)    Swelling, burning of skin .    Zicam Cold Remedy [Homeopathic Products] Nausea And Vomiting   Keflex [Cephalexin] Rash and Other (See Comments)    Rash--able to take w/ Benadryl   Vicodin [Hydrocodone-Acetaminophen] Hives and Rash    Per pt- makes her very sleepy, feels like she isn't getting her breath. States she does not have throat/tongue swelling or anaphylaxis.    Medications Prior to Admission  Medication Sig Dispense Refill Last Dose   Accu-Chek Softclix Lancets lancets Test blood glucose 4 times daily. ICD-10 code: 100.419.Use as instructed 100 each 12    acetaminophen (TYLENOL) 500 MG tablet Take 1,000 mg by mouth every 8 (eight) hours as needed (for pain or headaches).      aspirin EC 81 MG tablet Take 1 tablet (81 mg total) by mouth daily. 30 tablet 11    Blood Glucose Monitoring Suppl (ACCU-CHEK GUIDE ME) w/Device KIT 1 kit by Does not apply route in the morning, at noon, in the evening, and at bedtime. Use to check blood glucose level 4 times daily. ICD-10 code: 024.419 1 kit 0    Blood Pressure Monitoring (BLOOD PRESSURE MONITOR AUTOMAT) DEVI 1 Device by Does not apply route daily.  Automatic blood pressure cuff x-large-large size cuff. Blood pressure to be monitored regularly at home. ICD-10 code: O09.90. 1 each 0    ferrous sulfate 325 (65 FE) MG tablet Take 1 tablet (325 mg total) by mouth every other day. 30 tablet 3    glucose blood (ACCU-CHEK GUIDE) test strip Test blood glucose level 4 times daily. Use as instructed ICD-10 code: 024.419 100 each 12    levothyroxine (SYNTHROID) 150 MCG tablet Take 1  tablet (150 mcg total) by mouth daily. 90 tablet 3    meclizine (ANTIVERT) 25 MG tablet Take 1 tablet (25 mg total) by mouth 2 (two) times daily as needed for dizziness. 60 tablet 1    Misc. Devices (GOJJI WEIGHT SCALE) MISC 1 Device by Does not apply route daily as needed. To weight self daily as needed at home. ICD-10 code: O09.90 1 each 0    omeprazole (PRILOSEC) 20 MG capsule Take 1 capsule (20 mg total) by mouth daily. 90 capsule 1    Prenatal Vit-Fe Fumarate-FA (PRENATAL VITAMIN PLUS LOW IRON) 27-1 MG TABS Take 1 tablet by mouth daily. 30 tablet 11    Prenatal Vit-Fe Fumarate-FA (PRENATAL VITAMIN PLUS LOW IRON) 27-1 MG TABS Take 1 tablet by mouth daily. 30 tablet 9    promethazine (PHENERGAN) 25 MG tablet Take 0.5-1 tablets (12.5-25 mg total) by mouth every 6 (six) hours as needed for nausea or vomiting. (Patient not taking: Reported on 04/12/2019) 30 tablet 11      Review of Systems   All systems reviewed and negative except as stated in HPI  Blood pressure 119/86, pulse (!) 101, temperature 97.7 F (36.5 C), height '5\' 4"'  (1.626 m), weight 116.8 kg, last menstrual period 10/25/2018. General appearance: alert, cooperative, appears stated age and moderately obese Lungs: normal effort Heart: regular rate  Abdomen: soft, non-tender; bowel sounds normal Pelvic: gravid uterus GU: No vaginal lesions  Extremities: Homans sign is negative, no sign of DVT Presentation: cephalic Fetal monitoringBaseline: 145 bpm, Variability: Good {> 6 bpm), Accelerations: Reactive and  Decelerations: Absent Uterine activity: None Dilation: 2.5 Effacement (%): 50 Station: -2 Exam by:: Rama Sorci    Prenatal labs: ABO, Rh: --/--/A POS (07/13 4680) Antibody: NEG (07/13 0748) Rubella: <0.90 (12/30 1418) RPR: Non Reactive (04/28 0825)  HBsAg: Negative (12/30 1418)  HIV: Non Reactive (04/28 0825)  GBS: NEGATIVE/-- (07/13 0859)  2 hr Glucola abnormal Genetic screening  Low risk female Anatomy US LGA; marginal cord insertion  Prenatal Transfer Tool  Maternal Diabetes: Yes:  Diabetes Type:  Diet controlled Genetic Screening: Normal Maternal Ultrasounds/Referrals: LGA; marginal cord insertion Fetal Ultrasounds or other Referrals:  None Maternal Substance Abuse:  No Significant Maternal Medications:  None Significant Maternal Lab Results: Group B Strep negative  Results for orders placed or performed during the hospital encounter of 08/01/19 (from the past 24 hour(s))  CBC   Collection Time: 08/01/19  7:48 AM  Result Value Ref Range   WBC 12.4 (H) 4.0 - 10.5 K/uL   RBC 4.01 3.87 - 5.11 MIL/uL   Hemoglobin 9.3 (L) 12.0 - 15.0 g/dL   HCT 31.9 (L) 36 - 46 %   MCV 79.6 (L) 80.0 - 100.0 fL   MCH 23.2 (L) 26.0 - 34.0 pg   MCHC 29.2 (L) 30.0 - 36.0 g/dL   RDW 16.5 (H) 11.5 - 15.5 %   Platelets 334 150 - 400 K/uL   nRBC 0.0 0.0 - 0.2 %  Comprehensive metabolic panel   Collection Time: 08/01/19  7:48 AM  Result Value Ref Range   Sodium 136 135 - 145 mmol/L   Potassium 3.7 3.5 - 5.1 mmol/L   Chloride 105 98 - 111 mmol/L   CO2 19 (L) 22 - 32 mmol/L   Glucose, Bld 87 70 - 99 mg/dL   BUN 7 6 - 20 mg/dL   Creatinine, Ser 0.43 (L) 0.44 - 1.00 mg/dL   Calcium 8.8 (L) 8.9 - 10.3 mg/dL   Total Protein  6.0 (L) 6.5 - 8.1 g/dL   Albumin 2.5 (L) 3.5 - 5.0 g/dL   AST 16 15 - 41 U/L   ALT 9 0 - 44 U/L   Alkaline Phosphatase 174 (H) 38 - 126 U/L   Total Bilirubin 0.8 0.3 - 1.2 mg/dL   GFR calc non Af Amer >60 >60 mL/min   GFR calc Af Amer >60 >60 mL/min   Anion gap 12 5 - 15   Type and screen   Collection Time: 08/01/19  7:48 AM  Result Value Ref Range   ABO/RH(D) A POS    Antibody Screen NEG    Sample Expiration      08/04/2019,2359 Performed at [facility], [place] [address], [place], [place] [address]    Unit Number [id number]    Blood Component Type RED CELLS,LR    Unit division 00    Status of Unit ALLOCATED    Transfusion Status OK TO TRANSFUSE    Crossmatch Result Compatible    Unit Number [id number]    Blood Component Type RED CELLS,LR    Unit division 00    Status of Unit ALLOCATED    Transfusion Status OK TO TRANSFUSE    Crossmatch Result Compatible   BPAM RBC   Collection Time: 08/01/19  7:48 AM  Result Value Ref Range   Blood Product Unit Number [id number]    PRODUCT CODE [id number]    Unit Type and Rh 6200    Blood Product Expiration Date [id number]    Blood Product Unit Number [id number]    PRODUCT CODE [id number]    Unit Type and Rh 6200    Blood Product Expiration Date [id number]   SARS Coronavirus 2 by RT PCR (hospital order, performed in [place] hospital lab) Nasopharyngeal Nasopharyngeal Swab   Collection Time: 08/01/19  7:52 AM   Specimen: Nasopharyngeal Swab  Result Value Ref Range   SARS Coronavirus 2 NEGATIVE NEGATIVE  Group B strep by PCR   Collection Time: 08/01/19  8:59 AM   Specimen: Vaginal/Rectal; Genital  Result Value Ref Range   Group B strep by PCR NEGATIVE NEGATIVE    Patient Active Problem List   Diagnosis Date Noted   Limited prenatal care, third trimester 07/31/2019   BMI 45.0-49.9, adult ([place]) 06/07/2019   Diet controlled gestational diabetes mellitus (GDM) in third trimester 05/18/2019   LGA (large for gestational age) fetus affecting management of mother 05/17/2019   Marginal insertion of umbilical cord affecting management of mother 05/17/2019   Vertigo 01/31/2019   Supervision of high risk pregnancy, antepartum 01/09/2019   Degenerative disc disease at L5-S1 level 06/23/2016    Chronic back pain 05/20/2016   Ovarian cyst 04/25/2015   Rubella non-immune status, antepartum 03/11/2015   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 G3P1011 at 89w0dhere for IOL for GDMA1 with poor prenatal care in third trimester; LGA.  #Labor: Vertex by exam. Foley bulb placed manually and filled with 60 mL water; patient tolerated well. (Later removed by Dr. SNehemiah Settledue to blood loss of ~ 187 mL). Cytotec given. AROM/Pit PRN. Anticipate SVD. No hx shoulder dystocia. Proven to 3685g. Discussed possible shoulder dystocia and patient desires to proceed with vaginal delivery.  #Pain: Per patient  request #FWB: Cat I; EFW: 4300g #ID:  GBS neg by PCR #MOF: Breast #MOC: POPs #Circ:  Inpatient #Hx anxiety/depression: consider starting meds post-partum  #GDMA1: poor prenatal care in third trimester; CBG's q4h latent, q2h active labor. Glucose 87 on admission. GTT post-partum. Fasting glucose on mom/baby. #Hx cHTN: BP's currently normal range. CMP WNL. Will draw PCR if BP's elevated. #Hypothyroidism: Will repeat TSH and Free T4 today; last checked in April and abnormal. Cont Synthroid.  Barrington Ellison, MD [facility] Family Medicine Fellow, [facility] for [facility], [facility] 08/01/2019, 10:49 AM   Attestation of Attending Supervision of Obstetric Fellow: Evaluation and management procedures were performed by the Obstetric Fellow under my supervision and collaboration.  I have reviewed the Obstetric Fellow's note and chart, and I agree with the management and plan.  Patient has chronic iron deficiency anemia with starting hemoglobin of 9.3. Will need to watch carefully. 2 units of blood  placed on hold.  Loma Boston, DO Attending Physician Faculty Practice, [place]

## 2019-08-01 NOTE — Progress Notes (Signed)
Labor Progress Note Kimberly [name] is a 29 y.o. G3P1011 at [redacted]w[redacted]d presented for IOL for GDMA1. S: Feeling stronger ctx but overall comfortable. No more bleeding.   O:  BP 121/66   Pulse (!) 119   Temp 97.7 F (36.5 C)   Ht 5\' 4"  (1.626 m)   Wt 116.8 kg   LMP 10/25/2018 (Exact Date)   BMI 44.20 kg/m  EFM: 145, moderate variability, pos accels, no decels, reactive IUPC: q75m  CVE: Dilation: 3 Effacement (%): 50 Cervical Position: Middle Station: -2 Presentation: Vertex Exam by:: fari   A&P: 29 y.o. G3P1011 [redacted]w[redacted]d here for IOL for GDMA1. #Labor: S/p FB (see prior note) and Cytotec x1. Cont Pit. Unable to trace ctx well and minimal cervical change. AROM with clear fluid and IUPC placed anteriorly; titrate to adequate MVU's. Anticipate SVD. #Pain: per patient request #FWB: Cat I #GBS negative #GDMA1: last glucose 87; pending repeat  #Hypothyroid: cont Synthroid; TSH mildly elevated but Free T4 WNL #cHTN: normal range pressures currently, CMP WNL  Chauncey Mann, MD 4:03 PM

## 2019-08-01 NOTE — Progress Notes (Signed)
Went bedside per RN request for prolonged decel. Pit had already been turned off. MVU's previously around 240. Maternal heart rate in 140's and therefore terb not given. FHR now in 110's; previously around 140's. Good variability. Baby is still high; cervix unchanged. Discussed possible that should this persist and cervix unchanged, will recommend cesarean and patient understanding. Possibly poor descent due to infant size. Will continue to monitor for now. Restart Pit at 2 if Cat I for > 20 minutes and MVU's inadequate.  Barrington Ellison, MD [facility] Family Medicine Fellow, [facility] for [employer], [place]

## 2019-08-01 NOTE — Progress Notes (Signed)
Patient ID: Kimberly [name], female   DOB: 09-07-1990, 29 y.o.   MRN: [id number]  Called to patient's bedside due to vaginal bleeding. Foley balloon placed by Dr Marice Potter earlier. Cytotec given around 8am. Foley balloon removed. Cervix 2-3, thick, ballotable.   FHT category 1. QBL with Triton 129mL. Will continue to watch for now - possible marginal abruption. Blood placed on hold. Starting Hg 9.3.  Truett Mainland, DO

## 2019-08-02 ENCOUNTER — Encounter (HOSPITAL_COMMUNITY): Payer: Self-pay | Admitting: Obstetrics and Gynecology

## 2019-08-02 ENCOUNTER — Encounter (HOSPITAL_COMMUNITY)
Admission: AD | Disposition: A | Discharge status: Home / Self Care | Payer: Self-pay | Source: Home / Self Care | Attending: Obstetrics and Gynecology

## 2019-08-02 DIAGNOSIS — O322XX0 Maternal care for transverse and oblique lie, not applicable or unspecified: Secondary | ICD-10-CM | POA: Diagnosis not present

## 2019-08-02 DIAGNOSIS — O43123 Velamentous insertion of umbilical cord, third trimester: Secondary | ICD-10-CM | POA: Diagnosis not present

## 2019-08-02 DIAGNOSIS — O3663X0 Maternal care for excessive fetal growth, third trimester, not applicable or unspecified: Secondary | ICD-10-CM | POA: Diagnosis not present

## 2019-08-02 DIAGNOSIS — O411230 Chorioamnionitis, third trimester, not applicable or unspecified: Secondary | ICD-10-CM | POA: Diagnosis not present

## 2019-08-02 DIAGNOSIS — A419 Sepsis, unspecified organism: Secondary | ICD-10-CM

## 2019-08-02 DIAGNOSIS — O411290 Chorioamnionitis, unspecified trimester, not applicable or unspecified: Secondary | ICD-10-CM

## 2019-08-02 DIAGNOSIS — Z3A39 39 weeks gestation of pregnancy: Secondary | ICD-10-CM | POA: Diagnosis not present

## 2019-08-02 DIAGNOSIS — O99892 Other specified diseases and conditions complicating childbirth: Secondary | ICD-10-CM | POA: Diagnosis not present

## 2019-08-02 DIAGNOSIS — O24420 Gestational diabetes mellitus in childbirth, diet controlled: Secondary | ICD-10-CM | POA: Diagnosis not present

## 2019-08-02 DIAGNOSIS — O1002 Pre-existing essential hypertension complicating childbirth: Secondary | ICD-10-CM | POA: Diagnosis not present

## 2019-08-02 DIAGNOSIS — Z20822 Contact with and (suspected) exposure to covid-19: Secondary | ICD-10-CM | POA: Diagnosis not present

## 2019-08-02 DIAGNOSIS — O8604 Sepsis following an obstetrical procedure: Secondary | ICD-10-CM | POA: Diagnosis not present

## 2019-08-02 LAB — GLUCOSE, CAPILLARY
Glucose-Capillary: 126 mg/dL — ABNORMAL HIGH (ref 70–99)
Glucose-Capillary: 93 mg/dL (ref 70–99)

## 2019-08-02 LAB — PREPARE RBC (CROSSMATCH)

## 2019-08-02 LAB — CBC WITH DIFFERENTIAL/PLATELET
Abs Immature Granulocytes: 0.24 10*3/uL — ABNORMAL HIGH (ref 0.00–0.07)
Basophils Absolute: 0.1 10*3/uL (ref 0.0–0.1)
Basophils Relative: 0 %
Eosinophils Absolute: 0 10*3/uL (ref 0.0–0.5)
Eosinophils Relative: 0 %
HCT: 29.9 % — ABNORMAL LOW (ref 36.0–46.0)
Hemoglobin: 8.3 g/dL — ABNORMAL LOW (ref 12.0–15.0)
Immature Granulocytes: 1 %
Lymphocytes Relative: 4 %
Lymphs Abs: 1.1 10*3/uL (ref 0.7–4.0)
MCH: 22.8 pg — ABNORMAL LOW (ref 26.0–34.0)
MCHC: 27.8 g/dL — ABNORMAL LOW (ref 30.0–36.0)
MCV: 82.1 fL (ref 80.0–100.0)
Monocytes Absolute: 1.1 10*3/uL — ABNORMAL HIGH (ref 0.1–1.0)
Monocytes Relative: 4 %
Neutro Abs: 26.3 10*3/uL — ABNORMAL HIGH (ref 1.7–7.7)
Neutrophils Relative %: 91 %
Platelets: 348 10*3/uL (ref 150–400)
RBC: 3.64 MIL/uL — ABNORMAL LOW (ref 3.87–5.11)
RDW: 16.7 % — ABNORMAL HIGH (ref 11.5–15.5)
WBC Morphology: INCREASED
WBC: 28.8 10*3/uL — ABNORMAL HIGH (ref 4.0–10.5)
nRBC: 0.1 % (ref 0.0–0.2)

## 2019-08-02 LAB — COMPREHENSIVE METABOLIC PANEL
ALT: 8 U/L (ref 0–44)
AST: 19 U/L (ref 15–41)
Albumin: 1.9 g/dL — ABNORMAL LOW (ref 3.5–5.0)
Alkaline Phosphatase: 145 U/L — ABNORMAL HIGH (ref 38–126)
Anion gap: 12 (ref 5–15)
BUN: 5 mg/dL — ABNORMAL LOW (ref 6–20)
CO2: 17 mmol/L — ABNORMAL LOW (ref 22–32)
Calcium: 8.2 mg/dL — ABNORMAL LOW (ref 8.9–10.3)
Chloride: 107 mmol/L (ref 98–111)
Creatinine, Ser: 0.95 mg/dL (ref 0.44–1.00)
GFR calc Af Amer: >60 mL/min (ref >60)
GFR calc non Af Amer: >60 mL/min (ref >60)
Glucose, Bld: 101 mg/dL — ABNORMAL HIGH (ref 70–99)
Potassium: 4.4 mmol/L (ref 3.5–5.1)
Sodium: 136 mmol/L (ref 135–145)
Total Bilirubin: 1.7 mg/dL — ABNORMAL HIGH (ref 0.3–1.2)
Total Protein: 5 g/dL — ABNORMAL LOW (ref 6.5–8.1)

## 2019-08-02 LAB — FIBRINOGEN: Fibrinogen: 561 mg/dL — ABNORMAL HIGH (ref 210–475)

## 2019-08-02 LAB — CBC
HCT: 24.8 % — ABNORMAL LOW (ref 36.0–46.0)
Hemoglobin: 7.2 g/dL — ABNORMAL LOW (ref 12.0–15.0)
MCH: 22.9 pg — ABNORMAL LOW (ref 26.0–34.0)
MCHC: 29 g/dL — ABNORMAL LOW (ref 30.0–36.0)
MCV: 79 fL — ABNORMAL LOW (ref 80.0–100.0)
Platelets: 287 10*3/uL (ref 150–400)
RBC: 3.14 MIL/uL — ABNORMAL LOW (ref 3.87–5.11)
RDW: 16.7 % — ABNORMAL HIGH (ref 11.5–15.5)
WBC: 28.2 10*3/uL — ABNORMAL HIGH (ref 4.0–10.5)
nRBC: 0 % (ref 0.0–0.2)

## 2019-08-02 LAB — LACTIC ACID, PLASMA
Lactic Acid, Venous: 5.4 mmol/L (ref 0.5–1.9)
Lactic Acid, Venous: 3.5 mmol/L (ref 0.5–1.9)
Lactic Acid, Venous: 2.6 mmol/L (ref 0.5–1.9)
Lactic Acid, Venous: 2.4 mmol/L (ref 0.5–1.9)

## 2019-08-02 LAB — PROTIME-INR
INR: 1.2 (ref 0.8–1.2)
Prothrombin Time: 14.2 seconds (ref 11.4–15.2)

## 2019-08-02 LAB — APTT: aPTT: 28 seconds (ref 24–36)

## 2019-08-02 SURGERY — Surgical Case
Anesthesia: Epidural

## 2019-08-02 MED ORDER — KETOROLAC TROMETHAMINE 30 MG/ML IJ SOLN: 30.0000 mg | Freq: Once | INTRAMUSCULAR | Status: DC

## 2019-08-02 MED ORDER — MORPHINE SULFATE (PF) 0.5 MG/ML IJ SOLN
INTRAMUSCULAR | Status: AC
  Filled 2019-08-02: qty 10

## 2019-08-02 MED ORDER — NALOXONE HCL 4 MG/10ML IJ SOLN
1.0000 ug/kg/h | INTRAVENOUS | Status: DC | PRN
  Filled 2019-08-02: qty 5

## 2019-08-02 MED ORDER — TRANEXAMIC ACID-NACL 1000-0.7 MG/100ML-% IV SOLN
INTRAVENOUS | Status: AC
  Filled 2019-08-02: qty 100

## 2019-08-02 MED ORDER — ACETAMINOPHEN 325 MG PO TABS
650.0000 mg | ORAL_TABLET | Freq: Four times a day (QID) | ORAL | Status: DC | PRN
  Administered 2019-08-02: 650 mg via ORAL
  Filled 2019-08-02 (×2): qty 2

## 2019-08-02 MED ORDER — ACETAMINOPHEN 500 MG PO TABS: 1000.0000 mg | ORAL_TABLET | Freq: Once | ORAL | Status: DC

## 2019-08-02 MED ORDER — SIMETHICONE 80 MG PO CHEW: 80.0000 mg | CHEWABLE_TABLET | ORAL | Status: DC | PRN

## 2019-08-02 MED ORDER — NALOXONE HCL 0.4 MG/ML IJ SOLN: 0.4000 mg | INTRAMUSCULAR | Status: DC | PRN

## 2019-08-02 MED ORDER — PHENYLEPHRINE 40 MCG/ML (10ML) SYRINGE FOR IV PUSH (FOR BLOOD PRESSURE SUPPORT)
PREFILLED_SYRINGE | INTRAVENOUS | Status: DC | PRN
  Administered 2019-08-02: 40 ug via INTRAVENOUS
  Administered 2019-08-02 (×2): 120 ug via INTRAVENOUS
  Administered 2019-08-02: 80 ug via INTRAVENOUS
  Administered 2019-08-02: 40 ug via INTRAVENOUS
  Administered 2019-08-02 (×2): 80 ug via INTRAVENOUS
  Administered 2019-08-02: 40 ug via INTRAVENOUS
  Administered 2019-08-02 (×4): 80 ug via INTRAVENOUS
  Administered 2019-08-02: 120 ug via INTRAVENOUS

## 2019-08-02 MED ORDER — NALBUPHINE HCL 10 MG/ML IJ SOLN: 5.0000 mg | Freq: Once | INTRAMUSCULAR | Status: DC | PRN

## 2019-08-02 MED ORDER — OXYTOCIN-SODIUM CHLORIDE 30-0.9 UT/500ML-% IV SOLN: 2.5000 [IU]/h | INTRAVENOUS | Status: AC

## 2019-08-02 MED ORDER — TETANUS-DIPHTH-ACELL PERTUSSIS 5-2.5-18.5 LF-MCG/0.5 IM SUSP
0.5000 mL | Freq: Once | INTRAMUSCULAR | Status: DC
Start: 2019-08-03 — End: 2019-08-02

## 2019-08-02 MED ORDER — OXYTOCIN-SODIUM CHLORIDE 30-0.9 UT/500ML-% IV SOLN
INTRAVENOUS | Status: AC
  Filled 2019-08-02: qty 500

## 2019-08-02 MED ORDER — WITCH HAZEL-GLYCERIN EX PADS: 1.0000 "application " | MEDICATED_PAD | CUTANEOUS | Status: DC | PRN

## 2019-08-02 MED ORDER — TRANEXAMIC ACID-NACL 1000-0.7 MG/100ML-% IV SOLN
1000.0000 mg | INTRAVENOUS | Status: AC
  Administered 2019-08-02: 1000 mg via INTRAVENOUS

## 2019-08-02 MED ORDER — OXYTOCIN-SODIUM CHLORIDE 30-0.9 UT/500ML-% IV SOLN
INTRAVENOUS | Status: DC | PRN
  Administered 2019-08-02: 400 mL/h via INTRAVENOUS

## 2019-08-02 MED ORDER — PROMETHAZINE HCL 25 MG/ML IJ SOLN
6.2500 mg | INTRAMUSCULAR | Status: DC | PRN
  Administered 2019-08-02: 6.25 mg via INTRAVENOUS

## 2019-08-02 MED ORDER — KETOROLAC TROMETHAMINE 30 MG/ML IJ SOLN
30.0000 mg | Freq: Four times a day (QID) | INTRAMUSCULAR | Status: AC | PRN
  Administered 2019-08-02: 30 mg via INTRAVENOUS
  Filled 2019-08-02: qty 1

## 2019-08-02 MED ORDER — OXYCODONE HCL 5 MG PO TABS
5.0000 mg | ORAL_TABLET | ORAL | Status: DC | PRN
  Administered 2019-08-03 (×3): 5 mg via ORAL
  Administered 2019-08-04: 10 mg via ORAL
  Administered 2019-08-04: 5 mg via ORAL
  Administered 2019-08-04 (×2): 10 mg via ORAL
  Administered 2019-08-04: 5 mg via ORAL
  Administered 2019-08-05 (×2): 10 mg via ORAL
  Filled 2019-08-02: qty 2
  Filled 2019-08-02: qty 1
  Filled 2019-08-02: qty 2
  Filled 2019-08-02: qty 1
  Filled 2019-08-02: qty 2
  Filled 2019-08-02 (×3): qty 1
  Filled 2019-08-02 (×2): qty 2

## 2019-08-02 MED ORDER — KETOROLAC TROMETHAMINE 30 MG/ML IJ SOLN: 30.0000 mg | Freq: Four times a day (QID) | INTRAMUSCULAR | Status: AC | PRN

## 2019-08-02 MED ORDER — SODIUM CHLORIDE 0.9 % IV BOLUS: 1000.0000 mL | Freq: Once | INTRAVENOUS | Status: DC

## 2019-08-02 MED ORDER — LACTATED RINGERS AMNIOINFUSION: INTRAVENOUS | Status: DC

## 2019-08-02 MED ORDER — STERILE WATER FOR IRRIGATION IR SOLN
Status: DC | PRN
  Administered 2019-08-02: 1

## 2019-08-02 MED ORDER — COCONUT OIL OIL: 1.0000 "application " | TOPICAL_OIL | Status: DC | PRN

## 2019-08-02 MED ORDER — DEXAMETHASONE SODIUM PHOSPHATE 4 MG/ML IJ SOLN
INTRAMUSCULAR | Status: AC
  Filled 2019-08-02: qty 1

## 2019-08-02 MED ORDER — FENTANYL CITRATE (PF) 100 MCG/2ML IJ SOLN
INTRAMUSCULAR | Status: DC | PRN
  Administered 2019-08-02: 100 ug via EPIDURAL

## 2019-08-02 MED ORDER — SODIUM CHLORIDE 0.9% FLUSH: 3.0000 mL | INTRAVENOUS | Status: DC | PRN

## 2019-08-02 MED ORDER — NALBUPHINE HCL 10 MG/ML IJ SOLN: 5.0000 mg | INTRAMUSCULAR | Status: DC | PRN

## 2019-08-02 MED ORDER — DIBUCAINE (PERIANAL) 1 % EX OINT: 1.0000 "application " | TOPICAL_OINTMENT | CUTANEOUS | Status: DC | PRN

## 2019-08-02 MED ORDER — SODIUM CHLORIDE 0.9% IV SOLUTION: Freq: Once | INTRAVENOUS | Status: AC

## 2019-08-02 MED ORDER — MORPHINE SULFATE (PF) 0.5 MG/ML IJ SOLN
INTRAMUSCULAR | Status: DC | PRN
  Administered 2019-08-02: 2 mg via EPIDURAL

## 2019-08-02 MED ORDER — CLINDAMYCIN PHOSPHATE 900 MG/50ML IV SOLN
900.0000 mg | Freq: Three times a day (TID) | INTRAVENOUS | Status: AC
  Administered 2019-08-02 – 2019-08-04 (×7): 900 mg via INTRAVENOUS
  Filled 2019-08-02 (×7): qty 50

## 2019-08-02 MED ORDER — LACTATED RINGERS IV SOLN: INTRAVENOUS | Status: DC

## 2019-08-02 MED ORDER — DIPHENHYDRAMINE HCL 25 MG PO CAPS: 25.0000 mg | ORAL_CAPSULE | Freq: Once | ORAL | Status: DC

## 2019-08-02 MED ORDER — DIPHENHYDRAMINE HCL 25 MG PO CAPS: 25.0000 mg | ORAL_CAPSULE | Freq: Four times a day (QID) | ORAL | Status: DC | PRN

## 2019-08-02 MED ORDER — LEVOTHYROXINE SODIUM 25 MCG PO TABS
75.0000 ug | ORAL_TABLET | Freq: Every day | ORAL | Status: DC
  Administered 2019-08-03 – 2019-08-05 (×3): 75 ug via ORAL
  Filled 2019-08-02 (×3): qty 3

## 2019-08-02 MED ORDER — KETOROLAC TROMETHAMINE 30 MG/ML IJ SOLN
INTRAMUSCULAR | Status: AC
  Filled 2019-08-02: qty 1

## 2019-08-02 MED ORDER — DEXAMETHASONE SODIUM PHOSPHATE 10 MG/ML IJ SOLN
INTRAMUSCULAR | Status: AC
  Filled 2019-08-02: qty 1

## 2019-08-02 MED ORDER — DIPHENHYDRAMINE HCL 50 MG/ML IJ SOLN
12.5000 mg | INTRAMUSCULAR | Status: DC | PRN
  Administered 2019-08-02: 12.5 mg via INTRAVENOUS
  Filled 2019-08-02: qty 1

## 2019-08-02 MED ORDER — GENTAMICIN SULFATE 40 MG/ML IJ SOLN
5.0000 mg/kg | INTRAVENOUS | Status: DC
  Administered 2019-08-02 – 2019-08-03 (×2): 400 mg via INTRAVENOUS
  Filled 2019-08-02 (×2): qty 10

## 2019-08-02 MED ORDER — PNEUMOCOCCAL VAC POLYVALENT 25 MCG/0.5ML IJ INJ
0.5000 mL | INJECTION | INTRAMUSCULAR | Status: DC
  Filled 2019-08-02: qty 0.5

## 2019-08-02 MED ORDER — ACETAMINOPHEN 500 MG PO TABS
1000.0000 mg | ORAL_TABLET | Freq: Four times a day (QID) | ORAL | Status: AC
  Administered 2019-08-02 – 2019-08-03 (×4): 1000 mg via ORAL
  Filled 2019-08-02 (×4): qty 2

## 2019-08-02 MED ORDER — ZOLPIDEM TARTRATE 5 MG PO TABS: 5.0000 mg | ORAL_TABLET | Freq: Every evening | ORAL | Status: DC | PRN

## 2019-08-02 MED ORDER — PRENATAL MULTIVITAMIN CH
1.0000 | ORAL_TABLET | Freq: Every day | ORAL | Status: DC
  Administered 2019-08-03 – 2019-08-05 (×3): 1 via ORAL
  Filled 2019-08-02 (×3): qty 1

## 2019-08-02 MED ORDER — MENTHOL 3 MG MT LOZG: 1.0000 | LOZENGE | OROMUCOSAL | Status: DC | PRN

## 2019-08-02 MED ORDER — SIMETHICONE 80 MG PO CHEW
80.0000 mg | CHEWABLE_TABLET | Freq: Three times a day (TID) | ORAL | Status: DC
  Administered 2019-08-02 – 2019-08-05 (×6): 80 mg via ORAL
  Filled 2019-08-02 (×6): qty 1

## 2019-08-02 MED ORDER — LACTATED RINGERS IV SOLN: INTRAVENOUS | Status: DC | PRN

## 2019-08-02 MED ORDER — ENOXAPARIN SODIUM 60 MG/0.6ML ~~LOC~~ SOLN
60.0000 mg | SUBCUTANEOUS | Status: DC
  Administered 2019-08-03 – 2019-08-05 (×3): 60 mg via SUBCUTANEOUS
  Filled 2019-08-02 (×3): qty 0.6

## 2019-08-02 MED ORDER — SIMETHICONE 80 MG PO CHEW
80.0000 mg | CHEWABLE_TABLET | ORAL | Status: DC
  Administered 2019-08-02 – 2019-08-04 (×3): 80 mg via ORAL
  Filled 2019-08-02 (×3): qty 1

## 2019-08-02 MED ORDER — PROMETHAZINE HCL 25 MG/ML IJ SOLN
INTRAMUSCULAR | Status: AC
  Filled 2019-08-02: qty 1

## 2019-08-02 MED ORDER — PHENYLEPHRINE 40 MCG/ML (10ML) SYRINGE FOR IV PUSH (FOR BLOOD PRESSURE SUPPORT)
PREFILLED_SYRINGE | INTRAVENOUS | Status: AC
  Filled 2019-08-02: qty 10

## 2019-08-02 MED ORDER — SODIUM CHLORIDE 0.9 % IV SOLN
500.0000 mg | Freq: Once | INTRAVENOUS | Status: AC
  Administered 2019-08-02: 500 mg via INTRAVENOUS

## 2019-08-02 MED ORDER — SENNOSIDES-DOCUSATE SODIUM 8.6-50 MG PO TABS
2.0000 | ORAL_TABLET | ORAL | Status: DC
  Administered 2019-08-02 – 2019-08-04 (×3): 2 via ORAL
  Filled 2019-08-02 (×3): qty 2

## 2019-08-02 MED ORDER — SODIUM CHLORIDE 0.9 % IV SOLN
INTRAVENOUS | Status: AC
  Filled 2019-08-02: qty 500

## 2019-08-02 MED ORDER — PHENYLEPHRINE HCL-NACL 20-0.9 MG/250ML-% IV SOLN
INTRAVENOUS | Status: AC
  Filled 2019-08-02: qty 250

## 2019-08-02 MED ORDER — MISOPROSTOL 200 MCG PO TABS
ORAL_TABLET | ORAL | Status: AC
  Filled 2019-08-02: qty 4

## 2019-08-02 MED ORDER — ACETAMINOPHEN 325 MG PO TABS
325.0000 mg | ORAL_TABLET | Freq: Once | ORAL | Status: AC
  Administered 2019-08-02: 325 mg via ORAL

## 2019-08-02 MED ORDER — ACETAMINOPHEN 160 MG/5ML PO SOLN: 1000.0000 mg | Freq: Once | ORAL | Status: DC

## 2019-08-02 MED ORDER — FENTANYL CITRATE (PF) 100 MCG/2ML IJ SOLN: 25.0000 ug | INTRAMUSCULAR | Status: DC | PRN

## 2019-08-02 MED ORDER — PHENYLEPHRINE 40 MCG/ML (10ML) SYRINGE FOR IV PUSH (FOR BLOOD PRESSURE SUPPORT)
PREFILLED_SYRINGE | INTRAVENOUS | Status: AC
  Filled 2019-08-02: qty 20

## 2019-08-02 MED ORDER — SODIUM CHLORIDE 0.9% IV SOLUTION: Freq: Once | INTRAVENOUS | Status: DC

## 2019-08-02 MED ORDER — DIPHENHYDRAMINE HCL 25 MG PO CAPS: 25.0000 mg | ORAL_CAPSULE | ORAL | Status: DC | PRN

## 2019-08-02 SURGICAL SUPPLY — 44 items
APL SKNCLS STERI-STRIP NONHPOA (GAUZE/BANDAGES/DRESSINGS) ×1
BENZOIN TINCTURE PRP APPL 2/3 (GAUZE/BANDAGES/DRESSINGS) ×3 IMPLANT
CHLORAPREP W/TINT 26ML (MISCELLANEOUS) ×3 IMPLANT
CLAMP CORD UMBIL (MISCELLANEOUS) ×2 IMPLANT
CLOSURE STERI-STRIP 1/2X4 (GAUZE/BANDAGES/DRESSINGS) ×1
CLOSURE WOUND 1/2 X4 (GAUZE/BANDAGES/DRESSINGS) ×1
CLOTH BEACON ORANGE TIMEOUT ST (SAFETY) ×3 IMPLANT
CLSR STERI-STRIP ANTIMIC 1/2X4 (GAUZE/BANDAGES/DRESSINGS) ×1 IMPLANT
DRSG OPSITE POSTOP 4X10 (GAUZE/BANDAGES/DRESSINGS) ×3 IMPLANT
ELECT REM PT RETURN 9FT ADLT (ELECTROSURGICAL) ×3
ELECTRODE REM PT RTRN 9FT ADLT (ELECTROSURGICAL) ×1 IMPLANT
EXTRACTOR VACUUM M CUP 4 TUBE (SUCTIONS) IMPLANT
EXTRACTOR VACUUM M CUP 4' TUBE (SUCTIONS)
GAUZE SPONGE 4X4 12PLY STRL (GAUZE/BANDAGES/DRESSINGS) ×2 IMPLANT
GLOVE BIOGEL PI IND STRL 7.0 (GLOVE) ×2 IMPLANT
GLOVE BIOGEL PI IND STRL 7.5 (GLOVE) ×2 IMPLANT
GLOVE BIOGEL PI INDICATOR 7.0 (GLOVE) ×4
GLOVE BIOGEL PI INDICATOR 7.5 (GLOVE) ×4
GLOVE ECLIPSE 7.5 STRL STRAW (GLOVE) ×3 IMPLANT
GOWN STRL REUS W/TWL LRG LVL3 (GOWN DISPOSABLE) ×9 IMPLANT
HEMOSTAT SURGICEL 2X14 (HEMOSTASIS) ×2 IMPLANT
HEMOSTAT SURGICEL 2X3 (HEMOSTASIS) IMPLANT
HOVERMATT SINGLE USE (MISCELLANEOUS) ×2 IMPLANT
KIT ABG SYR 3ML LUER SLIP (SYRINGE) ×2 IMPLANT
NDL HYPO 25X5/8 SAFETYGLIDE (NEEDLE) IMPLANT
NEEDLE HYPO 25X5/8 SAFETYGLIDE (NEEDLE) ×3 IMPLANT
NS IRRIG 1000ML POUR BTL (IV SOLUTION) ×3 IMPLANT
PACK C SECTION WH (CUSTOM PROCEDURE TRAY) ×3 IMPLANT
PAD ABD 8X7 1/2 STERILE (GAUZE/BANDAGES/DRESSINGS) ×2 IMPLANT
PAD OB MATERNITY 4.3X12.25 (PERSONAL CARE ITEMS) ×3 IMPLANT
PENCIL SMOKE EVAC W/HOLSTER (ELECTROSURGICAL) ×3 IMPLANT
RTRCTR C-SECT PINK 25CM LRG (MISCELLANEOUS) ×3 IMPLANT
SPONGE LAP 18X18 RF (DISPOSABLE) ×4 IMPLANT
STRIP CLOSURE SKIN 1/2X4 (GAUZE/BANDAGES/DRESSINGS) ×2 IMPLANT
SUT PLAIN 2 0 XLH (SUTURE) ×2 IMPLANT
SUT VIC AB 0 CT1 36 (SUTURE) ×3 IMPLANT
SUT VIC AB 0 CTX 36 (SUTURE) ×12
SUT VIC AB 0 CTX36XBRD ANBCTRL (SUTURE) ×2 IMPLANT
SUT VIC AB 2-0 CT1 27 (SUTURE) ×3
SUT VIC AB 2-0 CT1 TAPERPNT 27 (SUTURE) ×1 IMPLANT
SUT VIC AB 4-0 KS 27 (SUTURE) ×3 IMPLANT
TOWEL OR 17X24 6PK STRL BLUE (TOWEL DISPOSABLE) ×3 IMPLANT
TRAY FOLEY W/BAG SLVR 14FR LF (SET/KITS/TRAYS/PACK) ×3 IMPLANT
WATER STERILE IRR 1000ML POUR (IV SOLUTION) ×3 IMPLANT

## 2019-08-02 NOTE — Progress Notes (Signed)
Informed by RN of vitals in PACU. Sepsis protocol initiated and went bedside to evaluate patient. EKG done and with sinus tachycardia only. Patient feels tired but otherwise okay. Bleeding has been appropriate. Suspect sepsis secondary to underlying Triple I; antibiotics only started this AM. Will give another fluid bolus and await labs for further action.   Vitals:   08/02/19 1015 08/02/19 1030 08/02/19 1045 08/02/19 1115  BP:  (!) 106/92  115/78  Pulse:  (!) 151 (!) 184 (!) 165  Resp:  19 (!) 26 (!) 24  Temp:      TempSrc:      SpO2: 98% 100% 100% 98%  Weight:      Height:       D/w Dr. Si Raider and Dr. Daiva Huge.  Barrington Ellison, MD [facility] Family Medicine Fellow, [place] for [employer], [place]

## 2019-08-02 NOTE — Transfer of Care (Signed)
Immediate Anesthesia Transfer of Care Note  Patient: Kimberly [name]  Procedure(s) Performed: CESAREAN SECTION (N/A )  Patient Location: PACU  Anesthesia Type:Epidural  Level of Consciousness: awake, alert  and patient cooperative  Airway & Oxygen Therapy: Patient Spontanous Breathing  Post-op Assessment: Report given to RN and Post -op Vital signs reviewed and stable  Post vital signs: Reviewed and stable  Last Vitals:  Vitals Value Taken Time  BP 118/83 08/02/19 1009  Temp    Pulse 135 08/02/19 1013  Resp 24 08/02/19 1013  SpO2 98 % 08/02/19 1013  Vitals shown include unvalidated device data.  Last Pain:  Vitals:   08/02/19 0731  TempSrc: Axillary         Complications: No complications documented.

## 2019-08-02 NOTE — Progress Notes (Signed)
Briefly, pt is a 29 yo g3p1011 @ 39+1 here for iol for diet-controlled gdm and chtn. Also LGA with efw of about 4300 g. Labor course has been complicated by triple I treated with clindamycin and gentamycin. Last cervical check about 2 hours ago and was 7 cm dilated. Has had recurrent shallow lates with fetal tachycardia since that time. I examined the pt at bedside w/ Dr. Marice Potter, cervical exam unchanged. Shared decision w/ pt to proceed with prompt cesarean for fetal indications.  The risks of cesarean section were discussed with the patient including but were not limited to: bleeding which may require transfusion or reoperation; infection which may require antibiotics; injury to bowel, bladder, ureters or other surrounding organs; injury to the fetus; need for additional procedures including hysterectomy in the event of a life-threatening hemorrhage; placental abnormalities wth subsequent pregnancies, incisional problems, thromboembolic phenomenon and other postoperative/anesthesia complications. Azithromycin 500 mg IV ordered. Anesthesia and OR aware.  Preoperative prophylactic antibiotics and SCDs ordered on call to the OR.  To OR when ready.

## 2019-08-02 NOTE — Op Note (Addendum)
Kimberly [name] PROCEDURE DATE: 08/02/2019  PREOPERATIVE DIAGNOSES: Intrauterine pregnancy at [redacted]w[redacted]d weeks gestation; non-reassuring fetal status (recurrent lates with fetal tachycardia in setting of Triple I; failure to progress)  POSTOPERATIVE DIAGNOSES: The same  PROCEDURE: Primary Low Transverse Cesarean Section  SURGEONS:   Dr. Laurey Arrow - Primary Dr. Barrington Ellison - Fellow Dr. Arlina Robes - Assistant  ANESTHESIOLOGY TEAM: Anesthesiologist: Brennan Bailey, MD; Freddrick March, MD CRNA: Vista Deck, CRNA  INDICATIONS: Kimberly [name] is a 29 y.o. [phone] at [redacted]w[redacted]d here for cesarean section secondary to the indications listed under preoperative diagnoses; please see preoperative note for further details.  The risks of surgery were discussed with the patient including but were not limited to: bleeding which may require transfusion or reoperation; infection which may require antibiotics; injury to bowel, bladder, ureters or other surrounding organs; injury to the fetus; need for additional procedures including hysterectomy in the event of a life-threatening hemorrhage; formation of adhesions; placental abnormalities wth subsequent pregnancies; incisional problems; thromboembolic phenomenon and other postoperative/anesthesia complications.  The patient concurred with the proposed plan, giving informed written consent for the procedure.    FINDINGS:  Viable female infant in cephalic presentation. Clear amniotic fluid.  Intact placenta, three vessel cord.  Normal uterus, fallopian tubes and ovaries bilaterally. Arterial cord pH: 7.11 APGAR (1 MIN): 3   APGAR (5 MINS): 9   APGAR (10 MINS):    ANESTHESIA: Epidural  INTRAVENOUS FLUIDS: 2400 ml   ESTIMATED BLOOD LOSS: 985 ml URINE OUTPUT:  50 ml SPECIMENS: Placenta sent to pathology COMPLICATIONS: right lateral lower uterine segment extension hemostatic at case end.  PROCEDURE IN DETAIL:  The patient preoperatively received intravenous  antibiotics and had sequential compression devices applied to her lower extremities.  She was then taken to the operating where the epidural anesthesia was dosed up to surgical level and was found to be adequate. Due to difficulty finding fetal heart rate with doppler and subjective bradycardia on bedside ultrasound; case changed to stat and patient was prepped with Betadine. A foley catheter had been placed into her bladder and attached to constant gravity.  After an adequate timeout was performed, a Pfannenstiel skin incision was made with scalpel and carried through to the underlying layer of fascia. The fascia was incised in the midline, and this incision was extended bilaterally using blunt traction. The rectus muscles were separated in the midline and the peritoneum was entered bluntly. Bladder blade was placed. Attention was turned to the lower uterine segment where a low transverse hysterotomy was made with a scalpel and extended bilaterally bluntly.  The infant was successfully delivered, the cord was clamped and cut immediately and the infant was handed over to the awaiting neonatology team. Cord gas obtained. TXA and buccal cytotec ordered given pre-op hgb 9.3 and risk factors for PPH. Uterine massage was then administered, and the placenta delivered intact with a three-vessel cord. The uterus was then cleared of clots and debris. Alexis retractor placed. The hysterotomy was closed with 0 Vicryl in a running locked fashion, and an imbricating layer was also placed with 0 Vicryl. Dr. Si Raider requested assistance of Dr. Rip Harbour due for further uterine closure and hemostasis given right low lateral extension. Figure-of-eight 0 Vicryl and 2-0 Vicryl serosal stitches and surgicel were placed to help with hemostasis.  The pelvis was cleared of all clot and debris. Hemostasis was confirmed on all surfaces. The retractor was removed.  The peritoneum was closed with a 2-0 Vicryl interrupted stitch.  The fascia was then  closed using 0 Vicryl in a running fashion.  The subcutaneous layer was irrigated, reapproximated with 2-0 plain gut running stitches and the skin was closed with a 4-0 Vicryl subcuticular stitch. The patient tolerated the procedure well. Sponge, instrument and needle counts were correct x 3.  She was taken to the recovery room in stable condition.   Barrington Ellison, MD [facility] Family Medicine Fellow, [facility] for Kimberly [name], [place] of Attending Supervision of Provider:  Evaluation and management procedures were performed by this provider under my supervision and collaboration. I have reviewed the provider's note and chart, and I agree with the management and plan.   Laurey Arrow, MD Faculty Practice, [name]

## 2019-08-02 NOTE — Discharge Summary (Signed)
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   Rubella non-immune status, antepartum   Chronic back pain   LGA (large for gestational age) fetus affecting management of mother   Diet controlled gestational diabetes mellitus (GDM) in third trimester   Chorioamnionitis   Sepsis ([place](post[place] hemorrhage)  Additional problems: Puerperal sepsis    Discharge diagnosis: Term Pregnancy Delivered, CHTN, GDM A1, Anemia and PPH                                              Post partum procedures:blood transfusion Augmentation: AROM, Pitocin, Cytotec and IP Foley Complications: Intrauterine Inflammation or infection (Chorioamniotis) and Hemorrhage>10051m Hospital course: Induction of Labor With Cesarean Section   [age].o. yo [id number] admitted to the hospital 08/01/2019 for induction of labor. Patient had a labor course significant for IOL secondary to GDMA1, LGA and cHTN (no meds). Patient received Cytotec, FB, Pitocin and AROM. She made slow progress and eventually progressed to 7/90/0 but developed Triple I and fetal tachycardia with recurrent lates; cervix unchanged despite 2+ hours adequate ctx. The patient went for cesarean section due to Non-Reassuring FHR. Delivery details are as follows: Membrane Rupture Time/Date: 3:18 PM ,08/01/2019   Delivery Method:C-Section, Low Transverse  Details of operation can be found in separate operative Note. Patient  developed sepsis and protocol initiated. She received 24 hours of abx post-partum. Fasting AM glucose 86. BP's monitored and were normal. Prozac 20 mg was started for anxiety/depression. She is ambulating, tolerating a regular diet, passing flatus, and urinating well.  Patient is discharged home in stable condition on 08/02/19.      Newborn Data: Birth date:08/02/2019  Birth time:9:03 AM  Gender:Female  Living status:Living  Apgars:3 ,9  Weight:3650 g                                 Magnesium Sulfate received: No BMZ received: No Rhophylac:N/A MMR:No T-DaP:Given prenatally Flu: No Transfusion:Yes  Physical exam  Vitals:   08/02/19 1015 08/02/19 1030 08/02/19 1045 08/02/19 1115  BP:  (!) 106/92  115/78  Pulse:  (!) 151 (!) 184 (!) 165  Resp:  19 (!) 26 (!) 24  Temp:      TempSrc:      SpO2: 98% 100% 100% 98%  Weight:      Height:       General: alert, cooperative and no distress Lochia: appropriate Uterine Fundus: firm Incision: Healing well with no significant drainage DVT Evaluation: No evidence of DVT seen on physical exam. Labs: Lab Results  Component Value Date   WBC 28.8 (H) 08/02/2019   HGB 8.3 (L) 08/02/2019   HCT 29.9 (L) 08/02/2019   MCV 82.1 08/02/2019   PLT 348 08/02/2019   CMP Latest Ref Rng & Units 08/01/2019  Glucose 70 - 99 mg/dL 87  BUN 6 - 20 mg/dL 7  Creatinine 0.44 - 1.00 mg/dL 0.43(L)  Sodium 135 - 145 mmol/L 136  Potassium 3.5 - 5.1 mmol/L 3.7  Chloride 98 - 111 mmol/L 105  CO2 22 - 32 mmol/L 19(L)  Calcium 8.9 - 10.3 mg/dL 8.8(L)  Total Protein 6.5 - 8.1 g/dL 6.0(L)  Total Bilirubin 0.3 - 1.2 mg/dL 0.8  Alkaline Phos 38 - 126 U/L 174(H)  AST 15 - 41 U/L 16  ALT 0 - 44 U/L 9   Edinburgh Score: No flowsheet data found.   After visit meds:  Allergies as of 08/05/2019      Reactions   Cold & Cough Daytime [acetaminophen-dm] Shortness Of Breath   Could not tolerate- orange liquid, may have contained Pseudoephedrine- "I could not  tolerate"   Sulfa Antibiotics Swelling   Zofran [ondansetron] Other (See Comments)   Muscle Spasms   Abilify [aripiprazole] Other (See Comments)   Lethargy, unable to function   Amoxicillin-pot Clavulanate Other (See Comments)   Stomach pains Has patient had a PCN reaction causing immediate rash, facial/tongue/throat swelling, SOB or lightheadedness with hypotension: No Has patient had a PCN reaction causing severe rash involving mucus membranes or skin necrosis: No Has patient had a PCN reaction that required hospitalization No Has patient had a PCN reaction occurring within the last 10 years: Yes If all of the above answers are "NO", then may proceed with Cephalosporin use.   Cephalexin Other (See Comments)   Stomach pains Pt taking Cefadroxil as outpatient   Ferrous Sulfate Nausea And Vomiting   Latex Swelling, Other (See Comments)   Burning of skin, too   Tape Other (See Comments)   Swelling, burning of skin .    Zicam Cold Remedy [homeopathic Products] Nausea And Vomiting   Keflex [cephalexin] Rash, Other (See Comments)   Rash--able to take w/ Benadryl   Vicodin [hydrocodone-acetaminophen] Hives, Rash   Per pt- makes her very sleepy, feels like she isn't getting her breath. States she does not have throat/tongue swelling or anaphylaxis.      Medication List    STOP taking these medications   Accu-Chek Guide Me w/Device Kit   Accu-Chek Guide test strip Generic drug: glucose blood   Accu-Chek Softclix Lancets lancets   aspirin EC 81 MG tablet   Blood Pressure Monitor Automat Devi   Gojji Weight Scale Misc   meclizine 25 MG tablet Commonly known as: ANTIVERT   omeprazole 20 MG capsule Commonly known as: PRILOSEC     TAKE these medications   ferrous sulfate 325 (65 FE) MG tablet Take 1 tablet (325 mg total) by mouth every other day.   FLUoxetine 20 MG capsule Commonly known as: PROzac Take 1 capsule (20 mg total) by mouth daily.   ibuprofen 800 MG  tablet Commonly known as: ADVIL Take 1 tablet (800 mg total) by mouth 3 (three) times daily.   levothyroxine 150 MCG tablet Commonly known as: Synthroid Take 1 tablet (150 mcg total) by mouth daily.   oxyCODONE 5 MG immediate release tablet Commonly known as: Oxy IR/ROXICODONE Take 1-2 tablets (5-10 mg total) by mouth every 4 (four) hours as needed for moderate pain.   Prenatal Vitamin Plus Low Iron 27-1 MG Tabs Take 1 tablet by mouth daily.   promethazine 25 MG tablet Commonly known as: PHENERGAN Take 0.5-1 tablets (12.5-25 mg total) by mouth every 6 (six) hours as needed for nausea or vomiting.  Discharge Care Instructions  (From admission, onward)         Start     Ordered   08/05/19 0000  Leave dressing on - Keep it clean, dry, and intact until clinic visit        08/05/19 0721            Discharge home in stable condition Infant Feeding: Bottle Infant Disposition:home with mother Discharge instruction: per After Visit Summary and Postpartum booklet. Activity: Advance as tolerated. Pelvic rest for 6 weeks.  Diet: routine diet Future Appointments: Future Appointments  Date Time Provider [place]  02/22/2020  1:20 PM Philemon Kingdom, MD [id number] None   Follow up Visit:   Please schedule this patient for a In person postpartum visit in 4 weeks with the following provider: Any provider. Additional Postpartum F/U:Postpartum Depression checkup, 2 hour GTT, Incision check 1 week and BP check 1 week  High risk pregnancy complicated by: GDM and HTN Delivery mode:  C-Section, Low Transverse  Anticipated Birth Control:  POPs   08/02/2019 Chauncey Mann, MD

## 2019-08-02 NOTE — Progress Notes (Signed)
Into room for FHR into 60s after epidural placement. BP 101/66 at this time Patient turned to right lateral decubitus, FSE placed FHR back to 110, then dropped down to 60s again IV Fluid bolus started, pitocin stopped Patient turned to left lateral decubitus, FHR into 60s Dr. Nehemiah Settle and Dr. Lanetta Inch called to room Patient placed in hands and knees 3 doses of phenylephrine had been given at this time given Terbutaline given FHR recovered to 115s, some early decels Amnioinfusion started BP recovered to 125/79 FHR now 125, mod var, accels, no decels  Discussed with Dr. Nehemiah Settle, will leave pitocin off until both mom and baby are stable for >30 minutes.  Kimberly Baba, DO OB Fellow, Faculty [facility] 08/02/2019 12:44 AM

## 2019-08-02 NOTE — Progress Notes (Signed)
ANTIBIOTIC CONSULT NOTE - INITIAL  Pharmacy Consult for Gentamicin Indication: Chorioamnionitis   Allergies  Allergen Reactions  . Cold & Cough Daytime [Acetaminophen-Dm] Shortness Of Breath    Could not tolerate- orange liquid, may have contained Pseudoephedrine- "I could not tolerate"  . Sulfa Antibiotics Swelling  . Zofran [Ondansetron] Other (See Comments)    Muscle Spasms  . Abilify [Aripiprazole] Other (See Comments)    Lethargy, unable to function  . Amoxicillin-Pot Clavulanate Other (See Comments)    Stomach pains Has patient had a PCN reaction causing immediate rash, facial/tongue/throat swelling, SOB or lightheadedness with hypotension: No Has patient had a PCN reaction causing severe rash involving mucus membranes or skin necrosis: No Has patient had a PCN reaction that required hospitalization No Has patient had a PCN reaction occurring within the last 10 years: Yes If all of the above answers are "NO", then may proceed with Cephalosporin use.   . Cephalexin Other (See Comments)    Stomach pains Pt taking Cefadroxil as outpatient  . Ferrous Sulfate Nausea And Vomiting  . Latex Swelling and Other (See Comments)    Burning of skin, too  . Tape Other (See Comments)    Swelling, burning of skin .   Marland Kitchen Zicam Cold Remedy [Homeopathic Products] Nausea And Vomiting  . Keflex [Cephalexin] Rash and Other (See Comments)    Rash--able to take w/ Benadryl  . Vicodin [Hydrocodone-Acetaminophen] Hives and Rash    Per pt- makes her very sleepy, feels like she isn't getting her breath. States she does not have throat/tongue swelling or anaphylaxis.    Patient Measurements: Height: 5\' 4"  (162.6 cm) Weight: 116.8 kg (257 lb 8 oz) IBW/kg (Calculated) : 54.7 Adjusted Body Weight: 79.5 kg  Vital Signs: Temp: 101.6 F (38.7 C) (07/14 0632) Temp Source: Axillary (07/14 3664) BP: 112/52 (07/14 4034) Pulse Rate: 156 (07/14 0632)  Labs: Recent Labs    08/01/19 0748  WBC 12.4*   HGB 9.3*  PLT 334  CREATININE 0.43*   No results for input(s): GENTTROUGH, GENTPEAK, GENTRANDOM in the last 72 hours.   Microbiology: Recent Results (from the past 720 hour(s))  SARS Coronavirus 2 by RT PCR (hospital order, performed in [facility] hospital lab) Nasopharyngeal Nasopharyngeal Swab     Status: None   Collection Time: 08/01/19  7:52 AM   Specimen: Nasopharyngeal Swab  Result Value Ref Range Status   SARS Coronavirus 2 NEGATIVE NEGATIVE Final    Comment: (NOTE) SARS-CoV-2 target nucleic acids are NOT DETECTED.  The SARS-CoV-2 RNA is generally detectable in upper and lower respiratory specimens during the acute phase of infection. The lowest concentration of SARS-CoV-2 viral copies this assay can detect is 250 copies / mL. A negative result does not preclude SARS-CoV-2 infection and should not be used as the sole basis for treatment or other patient management decisions.  A negative result may occur with improper specimen collection / handling, submission of specimen other than nasopharyngeal swab, presence of viral mutation(s) within the areas targeted by this assay, and inadequate number of viral copies (<250 copies / mL). A negative result must be combined with clinical observations, patient history, and epidemiological information.  Fact Sheet for Patients:   StrictlyIdeas.no  Fact Sheet for Healthcare Providers: BankingDealers.co.za  This test is not yet approved or  cleared by the [place] FDA and has been authorized for detection and/or diagnosis of SARS-CoV-2 by FDA under an Emergency Use Authorization (EUA).  This EUA will remain in effect (meaning this test  can be used) for the duration of the COVID-19 declaration under Section 564(b)(1) of the Act, 21 U.S.C. section 360bbb-3(b)(1), unless the authorization is terminated or revoked sooner.  Performed at [facility], [place] [address]  [place], [place] [address]   Group B strep by PCR     Status: None   Collection Time: 08/01/19  8:59 AM   Specimen: Vaginal/Rectal; Genital  Result Value Ref Range Status   Group B strep by PCR NEGATIVE NEGATIVE Final    Comment: (NOTE) Intrapartum testing with Xpert GBS assay should be used as an adjunct to other methods available and not used to replace antepartum testing (at 35-[redacted] weeks gestation). Performed at [facility], [place] [address], [place], [place] [address]     Medications:  Clindamycin 900mg  IV Q8 Gent 5mg /kg Q24  Assessment: 29 y.o. female G3P1011 at [redacted]w[redacted]d with fetal tachycardia and maternal fever.  Plan:  Gentamicin 5mg /kg adjusted BW mg IV every 24 hrs  Check Scr with next labs if gentamicin continued. Will check gentamicin levels if continued > 72hr or clinically indicated.  Nyra Capes 08/02/2019,6:41 AM

## 2019-08-02 NOTE — Progress Notes (Signed)
CRITICAL VALUE ALERT  Critical Value:  Lactic Acid 5.4  Date & Time Notied:  08/02/2019 1200  Provider Notified: Barrington Ellison, MD  Orders Received/Actions taken: no orders received.  Will repeat labs in 2 hours.

## 2019-08-02 NOTE — Progress Notes (Signed)
Paged by RN, fetal tachycardia in the 180s, mom has a fever of 101.55F that is not responsive to Tylenol.  #Chorioamniotis: Clinda 900mg  q8h (allergic to amoxicillin), Norva Karvonen per pharmacy consult. IV Fluid bolus given. Continue to monitor, anticipate vaginal delivery.   Rise Patience, DO PGY 1 Family Medicine

## 2019-08-02 NOTE — Progress Notes (Signed)
Kimberly [name] is a 29 y.o. G3P1011 at [redacted]w[redacted]d admitted for induction of labor due to [facility].  Subjective: Sleeping per RN, Pitocin restarted at 0200.  Objective: BP (!) 112/59   Pulse (!) 126   Temp 97.7 F (36.5 C)   Resp 16   Ht 5\' 4"  (1.626 m)   Wt 116.8 kg   LMP 10/25/2018 (Exact Date)   BMI 44.20 kg/m  No intake/output data recorded.  FHT:  FHR: 155 bpm, variability: moderate,  accelerations:  Present,  decelerations:  Absent UC:   regular, every 3-4 minutes  SVE:   Dilation: 5 Effacement (%): 50 Station: -2 Exam by:: Lenise Herald RN  Pitocin @ 2 mu/min  Labs: Lab Results  Component Value Date   WBC 12.4 (H) 08/01/2019   HGB 9.3 (L) 08/01/2019   HCT 31.9 (L) 08/01/2019   MCV 79.6 (L) 08/01/2019   PLT 334 08/01/2019    Assessment / Plan: 12 [id number] [redacted]w[redacted]d here for IOL for GDMA1.  #Labor: S/p FB (see prior noteby Dr. Nehemiah Settle) and Cytotec x1.S/p AROM with IUPC placement. Pit restarted at 1800 after prolonged decel. At 2351, after epidural, had FHR in the 60s with decreased BP, pit was stopped, several doses of phenylephrine as well as a dose of Terubutaline given. FHR recovered and FHT was a cat 1 for an hour before pit restarted at 0200.Anticipate SVD. #Pain: per patient request #FWB: Cat 1 #GBS negative #GDMA1: last glucose70 #Hypothyroid: cont Synthroid; TSH mildly elevated but Free T4 WNL #cHTN: normal range pressures currently, CMP WNL  [place] DO PGY1, Family Medicine Resident 08/02/2019, 4:02 AM

## 2019-08-02 NOTE — Anesthesia Postprocedure Evaluation (Signed)
Anesthesia Post Note  Patient: Kimberly [name]  Procedure(s) Performed: CESAREAN SECTION (N/A )     Patient location during evaluation: PACU Anesthesia Type: Epidural Level of consciousness: awake and alert and oriented Pain management: pain level controlled Vital Signs Assessment: post-procedure vital signs reviewed and stable Respiratory status: spontaneous breathing, nonlabored ventilation and respiratory function stable Cardiovascular status: blood pressure returned to baseline and tachycardic Postop Assessment: no apparent nausea or vomiting Anesthetic complications: no Comments: Sinus tachycardia 140s consistent with preop status (improved from 170s prior to additional LR bolus in PACU). BP stable 110s/60s. Patient awake and alert, no lightheadedness/dizziness. Transient nausea with improvement after phenergan. CBC/labs drawn in PACU-Hgb 8.3 without continued bleeding. Will be transferred to [facility] for continued monitoring. Daiva Huge, MD   No complications documented.  Last Vitals:  Vitals:   08/02/19 1130 08/02/19 1155  BP: 114/66 116/65  Pulse: (!) 145 (!) 145  Resp: (!) 31 20  Temp: (!) 39.6 C   SpO2: 93% 95%    Last Pain:  Vitals:   08/02/19 1130  TempSrc: Oral  PainSc: 1    Pain Goal:    LLE Motor Response: Purposeful movement (08/02/19 1130)   RLE Motor Response: Purposeful movement (08/02/19 1130)       Epidural/Spinal Function Cutaneous sensation: Able to Discern Pressure (08/02/19 1139), Patient able to flex knees: Yes (08/02/19 1139), Patient able to lift hips off bed: No (08/02/19 1139), Back pain beyond tenderness at insertion site: No (08/02/19 1139), Progressively worsening motor and/or sensory loss: No (08/02/19 1139), Bowel and/or bladder incontinence post epidural: No (08/02/19 1139)  Brennan Bailey

## 2019-08-03 LAB — COMPREHENSIVE METABOLIC PANEL
ALT: 12 U/L (ref 0–44)
AST: 21 U/L (ref 15–41)
Albumin: 1.8 g/dL — ABNORMAL LOW (ref 3.5–5.0)
Alkaline Phosphatase: 97 U/L (ref 38–126)
Anion gap: 8 (ref 5–15)
BUN: 10 mg/dL (ref 6–20)
CO2: 22 mmol/L (ref 22–32)
Calcium: 8.5 mg/dL — ABNORMAL LOW (ref 8.9–10.3)
Chloride: 105 mmol/L (ref 98–111)
Creatinine, Ser: 0.56 mg/dL (ref 0.44–1.00)
GFR calc Af Amer: >60 mL/min (ref >60)
GFR calc non Af Amer: >60 mL/min (ref >60)
Glucose, Bld: 111 mg/dL — ABNORMAL HIGH (ref 70–99)
Potassium: 3.8 mmol/L (ref 3.5–5.1)
Sodium: 135 mmol/L (ref 135–145)
Total Bilirubin: 0.7 mg/dL (ref 0.3–1.2)
Total Protein: 4.5 g/dL — ABNORMAL LOW (ref 6.5–8.1)

## 2019-08-03 LAB — CBC
HCT: 26.1 % — ABNORMAL LOW (ref 36.0–46.0)
Hemoglobin: 7.7 g/dL — ABNORMAL LOW (ref 12.0–15.0)
MCH: 23.5 pg — ABNORMAL LOW (ref 26.0–34.0)
MCHC: 29.5 g/dL — ABNORMAL LOW (ref 30.0–36.0)
MCV: 79.6 fL — ABNORMAL LOW (ref 80.0–100.0)
Platelets: 225 10*3/uL (ref 150–400)
RBC: 3.28 MIL/uL — ABNORMAL LOW (ref 3.87–5.11)
RDW: 16.6 % — ABNORMAL HIGH (ref 11.5–15.5)
WBC: 22.1 10*3/uL — ABNORMAL HIGH (ref 4.0–10.5)
nRBC: 0 % (ref 0.0–0.2)

## 2019-08-03 LAB — SURGICAL PATHOLOGY

## 2019-08-03 MED ORDER — IBUPROFEN 800 MG PO TABS
800.0000 mg | ORAL_TABLET | Freq: Three times a day (TID) | ORAL | Status: DC
  Administered 2019-08-03 – 2019-08-05 (×6): 800 mg via ORAL
  Filled 2019-08-03 (×6): qty 1

## 2019-08-03 NOTE — Progress Notes (Signed)
Subjective: Postpartum Day 1: Cesarean Delivery d/t NRFHT's, Triple I, maternal sepsis  Pt reports feeling tired this morning. Pain controlled. Voiding without problems. Tolerating diet.  Objective: Vital signs in last 24 hours: Temp:  [97.6 F (36.4 C)-103.2 F (39.6 C)] 97.6 F (36.4 C) (07/15 0722) Pulse Rate:  [96-184] 104 (07/15 0722) Resp:  [17-34] 18 (07/14 1931) BP: (95-123)/(41-92) 97/55 (07/15 0722) SpO2:  [93 %-100 %] 95 % (07/15 0722)  Physical Exam:  General: alert Lochia: appropriate Uterine Fundus: firm Incision: drsing intact DVT Evaluation: No evidence of DVT seen on physical exam.  Recent Labs    08/02/19 1754 08/03/19 0522  HGB 7.2* 7.7*  HCT 24.8* 26.1*    Assessment/Plan: Status post Cesarean section. Doing well postoperatively. Stable. Sepsis resolving. Will d/c antibiotics after 24 hours. Repeat labs in AM. Continue with progressive care    Chancy Milroy 08/03/2019, 10:01 AM

## 2019-08-03 NOTE — Progress Notes (Signed)
Patient ID: Kimberly [name], female   DOB: [birth year], 29 y.o.   MRN: [id number] Attending Circumcision Counseling Progress Note  Patient desires circumcision for her female infant.  Circumcision procedure details discussed, risks and benefits of procedure were also discussed.  These include but are not limited to: Benefits of circumcision in men include reduction in the rates of urinary tract infection (UTI), penile cancer, some sexually transmitted infections, penile inflammatory and retractile disorders, as well as easier hygiene.  Risks include bleeding , infection, injury of glans which may lead to penile deformity or urinary tract issues, unsatisfactory cosmetic appearance and other potential complications related to the procedure.  It was emphasized that this is an elective procedure.  Patient wants to proceed with circumcision; written informed consent obtained.  Will do circumcision soon, routine circumcision and post circumcision care ordered for the infant.  Titianna Loomis L. Rip Harbour, M.D. 08/03/2019 12:29 PM

## 2019-08-03 NOTE — Progress Notes (Signed)
[name] was referred for history of depression/anxiety. * Referral screened out by Clinical Social Worker because none of the following criteria appear to apply: ~ History of anxiety/depression during this pregnancy, or of post-partum depression following prior delivery. ~ Diagnosis of anxiety and/or depression within last 3 years. Patient can receive follow-up care [place] with [facility].  OR * [name]'s symptoms currently being treated with medication and/or therapy.  Please contact the Clinical Social Worker if needs arise, by [facility] request, or if [name] scores greater than 9/yes to question 10 on Edinburgh Postpartum Depression Screen.  Laurey Arrow, MSW, LCSW Clinical Social Work [phone]

## 2019-08-04 LAB — COMPREHENSIVE METABOLIC PANEL
ALT: 12 U/L (ref 0–44)
AST: 20 U/L (ref 15–41)
Albumin: 1.6 g/dL — ABNORMAL LOW (ref 3.5–5.0)
Alkaline Phosphatase: 105 U/L (ref 38–126)
Anion gap: 10 (ref 5–15)
BUN: 8 mg/dL (ref 6–20)
CO2: 23 mmol/L (ref 22–32)
Calcium: 8.5 mg/dL — ABNORMAL LOW (ref 8.9–10.3)
Chloride: 108 mmol/L (ref 98–111)
Creatinine, Ser: 0.54 mg/dL (ref 0.44–1.00)
GFR calc Af Amer: >60 mL/min (ref >60)
GFR calc non Af Amer: >60 mL/min (ref >60)
Glucose, Bld: 86 mg/dL (ref 70–99)
Potassium: 3.4 mmol/L — ABNORMAL LOW (ref 3.5–5.1)
Sodium: 141 mmol/L (ref 135–145)
Total Bilirubin: 0.3 mg/dL (ref 0.3–1.2)
Total Protein: 4.9 g/dL — ABNORMAL LOW (ref 6.5–8.1)

## 2019-08-04 LAB — CBC
HCT: 24.3 % — ABNORMAL LOW (ref 36.0–46.0)
Hemoglobin: 7.1 g/dL — ABNORMAL LOW (ref 12.0–15.0)
MCH: 23.2 pg — ABNORMAL LOW (ref 26.0–34.0)
MCHC: 29.2 g/dL — ABNORMAL LOW (ref 30.0–36.0)
MCV: 79.4 fL — ABNORMAL LOW (ref 80.0–100.0)
Platelets: 217 10*3/uL (ref 150–400)
RBC: 3.06 MIL/uL — ABNORMAL LOW (ref 3.87–5.11)
RDW: 16.6 % — ABNORMAL HIGH (ref 11.5–15.5)
WBC: 15.3 10*3/uL — ABNORMAL HIGH (ref 4.0–10.5)
nRBC: 0 % (ref 0.0–0.2)

## 2019-08-04 LAB — PREPARE RBC (CROSSMATCH)

## 2019-08-04 LAB — LACTIC ACID, PLASMA: Lactic Acid, Venous: 1.5 mmol/L (ref 0.5–1.9)

## 2019-08-04 LAB — STREP GP B CULTURE+RFLX: Strep Gp B Culture+Rflx: NEGATIVE

## 2019-08-04 MED ORDER — DIPHENHYDRAMINE HCL 25 MG PO CAPS
25.0000 mg | ORAL_CAPSULE | Freq: Once | ORAL | Status: AC
  Administered 2019-08-04: 25 mg via ORAL
  Filled 2019-08-04: qty 1

## 2019-08-04 MED ORDER — ACETAMINOPHEN 325 MG PO TABS
650.0000 mg | ORAL_TABLET | Freq: Once | ORAL | Status: AC
  Administered 2019-08-04: 650 mg via ORAL
  Filled 2019-08-04: qty 2

## 2019-08-04 MED ORDER — SODIUM CHLORIDE 0.9% IV SOLUTION: Freq: Once | INTRAVENOUS | Status: AC

## 2019-08-04 MED ORDER — SODIUM CHLORIDE 0.9 % IV SOLN
500.0000 mg | Freq: Once | INTRAVENOUS | Status: DC
  Filled 2019-08-04 (×2): qty 25

## 2019-08-04 NOTE — Progress Notes (Signed)
Post Partum Day 2 c section d/t NRFHT's, Triple I, maternal sepsis Subjective: Feels more tired today. Voiding without problems. Tolerating diet. Pain controlled  Objective: Blood pressure 115/68, pulse (!) 125, temperature 98.7 F (37.1 C), temperature source Oral, resp. rate 18, height 5\' 4"  (1.626 m), weight 116.8 kg, last menstrual period 10/25/2018, SpO2 96 %, unknown if currently breastfeeding.  Physical Exam:  General: alert Lochia: appropriate Uterine Fundus: firm Incision: healing well DVT Evaluation: No evidence of DVT seen on physical exam.  Recent Labs    08/03/19 0522 08/04/19 0700  HGB 7.7* 7.1*  HCT 26.1* 24.3*    Assessment/Plan: Stable. Discussed blood transfusion. Pt is agreeable. Continue with progressive care    LOS: 3 days   Kimberly [name] 08/04/2019, 9:42 AM

## 2019-08-05 LAB — TYPE AND SCREEN
ABO/RH(D): A POS
Antibody Screen: NEGATIVE
Unit division: 0
Unit division: 0
Unit division: 0
Unit division: 0

## 2019-08-05 LAB — CBC
HCT: 32.2 % — ABNORMAL LOW (ref 36.0–46.0)
Hemoglobin: 9.8 g/dL — ABNORMAL LOW (ref 12.0–15.0)
MCH: 24.7 pg — ABNORMAL LOW (ref 26.0–34.0)
MCHC: 30.4 g/dL (ref 30.0–36.0)
MCV: 81.1 fL (ref 80.0–100.0)
Platelets: 278 10*3/uL (ref 150–400)
RBC: 3.97 MIL/uL (ref 3.87–5.11)
RDW: 16.9 % — ABNORMAL HIGH (ref 11.5–15.5)
WBC: 14.7 10*3/uL — ABNORMAL HIGH (ref 4.0–10.5)
nRBC: 0 % (ref 0.0–0.2)

## 2019-08-05 LAB — BPAM RBC
Blood Product Expiration Date: 202108082359
Blood Product Expiration Date: 202108082359
Blood Product Expiration Date: 202108172359
Blood Product Expiration Date: 202108172359
ISSUE DATE / TIME: 202107141958
ISSUE DATE / TIME: 202107161014
ISSUE DATE / TIME: 202107161246
ISSUE DATE / TIME: 202107161607
Unit Type and Rh: 6200
Unit Type and Rh: 6200
Unit Type and Rh: 6200
Unit Type and Rh: 6200

## 2019-08-05 MED ORDER — FLUOXETINE HCL 20 MG PO CAPS
20.0000 mg | ORAL_CAPSULE | Freq: Every day | ORAL | 3 refills | Status: DC
Start: 2019-08-05 — End: 2019-08-13

## 2019-08-05 MED ORDER — IBUPROFEN 800 MG PO TABS: 800.0000 mg | ORAL_TABLET | Freq: Three times a day (TID) | ORAL | 0 refills | Status: DC

## 2019-08-05 MED ORDER — OXYCODONE HCL 5 MG PO TABS: 5.0000 mg | ORAL_TABLET | ORAL | 0 refills | Status: DC | PRN

## 2019-08-05 NOTE — Discharge Instructions (Signed)
Cesarean Delivery, Care After This sheet gives you information about how to care for yourself after your procedure. Your health care provider may also give you more specific instructions. If you have problems or questions, contact your health care provider. What can I expect after the procedure? After the procedure, it is common to have:  A small amount of blood or clear fluid coming from the incision.  Some redness, swelling, and pain in your incision area.  Some abdominal pain and soreness.  Vaginal bleeding (lochia). Even though you did not have a vaginal delivery, you will still have vaginal bleeding and discharge.  Pelvic cramps.  Fatigue. You may have pain, swelling, and discomfort in the tissue between your vagina and your anus (perineum) if:  Your C-section was unplanned, and you were allowed to labor and push.  An incision was made in the area (episiotomy) or the tissue tore during attempted vaginal delivery. Follow these instructions at home: Incision care   Follow instructions from your health care provider about how to take care of your incision. Make sure you: ? Wash your hands with soap and water before you change your bandage (dressing). If soap and water are not available, use hand sanitizer. ? If you have a dressing, change it or remove it as told by your health care provider. ? Leave stitches (sutures), skin staples, skin glue, or adhesive strips in place. These skin closures may need to stay in place for 2 weeks or longer. If adhesive strip edges start to loosen and curl up, you may trim the loose edges. Do not remove adhesive strips completely unless your health care provider tells you to do that.  Check your incision area every day for signs of infection. Check for: ? More redness, swelling, or pain. ? More fluid or blood. ? Warmth. ? Pus or a bad smell.  Do not take baths, swim, or use a hot tub until your health care provider says it's okay. Ask your health  care provider if you can take showers.  When you cough or sneeze, hug a pillow. This helps with pain and decreases the chance of your incision opening up (dehiscing). Do this until your incision heals. Medicines  Take over-the-counter and prescription medicines only as told by your health care provider.  If you were prescribed an antibiotic medicine, take it as told by your health care provider. Do not stop taking the antibiotic even if you start to feel better.  Do not drive or use heavy machinery while taking prescription pain medicine. Lifestyle  Do not drink alcohol. This is especially important if you are breastfeeding or taking pain medicine.  Do not use any products that contain nicotine or tobacco, such as cigarettes, e-cigarettes, and chewing tobacco. If you need help quitting, ask your health care provider. Eating and drinking  Drink at least 8 eight-ounce glasses of water every day unless told not to by your health care provider. If you breastfeed, you may need to drink even more water.  Eat high-fiber foods every day. These foods may help prevent or relieve constipation. High-fiber foods include: ? Whole grain cereals and breads. ? Brown rice. ? Beans. ? Fresh fruits and vegetables. Activity   If possible, have someone help you care for your baby and help with household activities for at least a few days after you leave the hospital.  Return to your normal activities as told by your health care provider. Ask your health care provider what activities are safe for   you.  Rest as much as possible. Try to rest or take a nap while your baby is sleeping.  Do not lift anything that is heavier than 10 lbs (4.5 kg), or the limit that you were told, until your health care provider says that it is safe.  Talk with your health care provider about when you can engage in sexual activity. This may depend on your: ? Risk of infection. ? How fast you heal. ? Comfort and desire to  engage in sexual activity. General instructions  Do not use tampons or douches until your health care provider approves.  Wear loose, comfortable clothing and a supportive and well-fitting bra.  Keep your perineum clean and dry. Wipe from front to back when you use the toilet.  If you pass a blood clot, save it and call your health care provider to discuss. Do not flush blood clots down the toilet before you get instructions from your health care provider.  Keep all follow-up visits for you and your baby as told by your health care provider. This is important. Contact a health care provider if:  You have: ? A fever. ? Bad-smelling vaginal discharge. ? Pus or a bad smell coming from your incision. ? Difficulty or pain when urinating. ? A sudden increase or decrease in the frequency of your bowel movements. ? More redness, swelling, or pain around your incision. ? More fluid or blood coming from your incision. ? A rash. ? Nausea. ? Little or no interest in activities you used to enjoy. ? Questions about caring for yourself or your baby.  Your incision feels warm to the touch.  Your breasts turn red or become painful or hard.  You feel unusually sad or worried.  You vomit.  You pass a blood clot from your vagina.  You urinate more than usual.  You are dizzy or light-headed. Get help right away if:  You have: ? Pain that does not go away or get better with medicine. ? Chest pain. ? Difficulty breathing. ? Blurred vision or spots in your vision. ? Thoughts about hurting yourself or your baby. ? New pain in your abdomen or in one of your legs. ? A severe headache.  You faint.  You bleed from your vagina so much that you fill more than one sanitary pad in one hour. Bleeding should not be heavier than your heaviest period. Summary  After the procedure, it is common to have pain at your incision site, abdominal cramping, and slight bleeding from your vagina.  Check  your incision area every day for signs of infection.  Tell your health care provider about any unusual symptoms.  Keep all follow-up visits for you and your baby as told by your health care provider. This information is not intended to replace advice given to you by your health care provider. Make sure you discuss any questions you have with your health care provider. Document Revised: 07/14/2017 Document Reviewed: 07/14/2017 Elsevier Patient Education  2020 [employer].  

## 2019-08-05 NOTE — Progress Notes (Signed)
Pt discharged after discharge instructions given. All questions answered. IV discontinued. All belongings sent with patient. Pt in stable condition.

## 2019-08-07 ENCOUNTER — Telehealth: Payer: Self-pay | Admitting: *Deleted

## 2019-08-07 LAB — CULTURE, BLOOD (ROUTINE X 2)
Culture: NO GROWTH
Culture: NO GROWTH
Special Requests: ADEQUATE

## 2019-08-07 NOTE — Telephone Encounter (Signed)
Pt left VM message wanting to know what will happen during her scheduled appt on 7/21 @ 2:30 pm. I returned her call and advised her of the purpose of the visit to assess her incision as well as check her blood pressure. Pt had also sent a MyChart message regarding converns about the honeycomb dressing. I advised that this will be removed at her visit on 7/21. She can take a daily shower. Pt voiced understanding and had no further questions.

## 2019-08-09 ENCOUNTER — Encounter: Payer: Self-pay | Admitting: General Practice

## 2019-08-09 ENCOUNTER — Ambulatory Visit (INDEPENDENT_AMBULATORY_CARE_PROVIDER_SITE_OTHER): Payer: Medicaid Other | Admitting: Obstetrics and Gynecology

## 2019-08-09 ENCOUNTER — Other Ambulatory Visit: Payer: Self-pay

## 2019-08-09 VITALS — BP 130/84 | HR 124 | Temp 99.4°F | Ht 64.0 in | Wt 253.0 lb

## 2019-08-09 DIAGNOSIS — Z5189 Encounter for other specified aftercare: Secondary | ICD-10-CM | POA: Diagnosis not present

## 2019-08-09 DIAGNOSIS — Z0130 Encounter for examination of blood pressure without abnormal findings: Secondary | ICD-10-CM

## 2019-08-09 MED ORDER — CLINDAMYCIN HCL 300 MG PO CAPS: 300.0000 mg | ORAL_CAPSULE | Freq: Three times a day (TID) | ORAL | 0 refills | Status: DC

## 2019-08-09 MED ORDER — NIFEDIPINE ER OSMOTIC RELEASE 30 MG PO TB24: 30.0000 mg | ORAL_TABLET | Freq: Every day | ORAL | 2 refills | Status: DC

## 2019-08-09 NOTE — Progress Notes (Addendum)
    Post Cesarean Section Incision Check  Kimberly [name] is a 29 y.o. Caucasian [id number] (Patient's last menstrual period was 10/25/2018 (exact date).) who is s/p 1LTCS on 08/02/19 at 39 weeks for non-reassuring fetal status, complicated by chorio. She was discharged to home on POD#3. 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  Complains of some pain at incision. Mostly has pain over mons and in between thighs. Reports rash in this area. Low grade fever at home up to 100.6. Feeling flushed occasionally. Denies other complaints, no issues with restroom or eating. Appetite returning to normal   Physical Exam:  BP 130/84   Pulse (!) 124   Temp 99.4 F (37.4 C) (Oral)   Ht 5\' 4"  (1.626 m)   Wt 253 lb (114.8 kg)   LMP 10/25/2018 (Exact Date)   BMI 43.43 kg/m  Body mass index is 43.43 kg/m. General appearance: Well nourished, well developed female in no acute distress.  Abdomen: soft, appropriately tender, incision clean, dry, intact, some erythema and edema over pannus, multiple erythematous plaques, mildly tender to palpation, over thighs at groin fold and over left mons   Assessment: Patient is a 29 y.o. [id number] who is 2 weeks post partum from a primary c-section. She has some erythematous, superficial appearing plaques that are mildly tender, concern for cellulitis. Wound on thigh cultured and oral clindamycin sent to pharmacy (multiple allergies). Incision appears to be healing appropriately, mild edema in pannus.    Elevated BP, start procardia 30 mg XL today.  Plan:  Follow up 1 week, to MAU with worsening pain/fever.   Feliz Beam, M.D. Attending Center for [employer])

## 2019-08-09 NOTE — Progress Notes (Signed)
7/14 primary c-section  RLQ/back pain cannot lay down as a result. Denies incisional pain. Foul odor coming from side of leg- feels like this is a result of laying on pad in hospital & not being cleaned properly. Feels uncomfortable on her inner thighs Takes ibuprofen & percocet as needed. Baby is feeding by bottle. Reports mild headache that is improved with medication/rest. Denies dizziness or blurry vision. Trace edema noted. Taking colace & beano for gas pain and constipation prevention. Pre-E with last pregnancy had some elevated BP while in hospital with this baby. Patient has not been on BP medication. Patient is ambulating at home frequently. Patient's face, chest & arms are flushed red. Reports fever of 100.4 last night and 100.6 today.

## 2019-08-09 NOTE — BH Specialist Note (Signed)
Integrated Behavioral Health via Telemedicine Video (Caregility) Visit  08/09/2019 Kimberly [name] [id number]  Number of [place] visits: 1 Session Start time: 1:18  Session End time: 1:27 Total time: 9  Referring Provider: Arlina Robes, MD Type of Visit: Video Patient/Family location: Home [facility] Provider location: [facility] for [place] [facility]  All persons participating in visit: [name] and [name]    Confirmed patient's address: Yes  Confirmed patient's phone number: Yes  Any changes to demographics: No   Confirmed patient's insurance: Yes  Any changes to patient's insurance: No   Discussed confidentiality: Yes   I connected with Kimberly [name] by a video enabled telemedicine application ([employer]) and verified that I am speaking with the correct person using two identifiers.     I discussed the limitations of evaluation and management by telemedicine and the availability of in person appointments.  I discussed that the purpose of this visit is to provide behavioral health care while limiting exposure to the novel coronavirus.   Discussed there is a possibility of technology failure and discussed alternative modes of communication if that failure occurs.  I discussed that engaging in this virtual visit, they consent to the provision of behavioral healthcare and the services will be billed under their insurance.  Patient and/or legal guardian expressed understanding and consented to virtual visit: Yes   PRESENTING CONCERNS: Patient and/or family reports the following symptoms/concerns: Pt states she is taking Prozac 25mg  as prescribed with no side effects; goal is to manage symptoms of depression and anxiety postpartum Duration of problem: Ongoing; Severity of problem: mild  STRENGTHS (Protective Factors/Coping Skills): Adhering to treatment via medication  GOALS ADDRESSED: Patient  will: 1.  Maintain reduction of symptoms of: anxiety and depression   INTERVENTIONS: Interventions utilized:  Supportive Counseling and Medication Monitoring Standardized Assessments completed: No time today; will assess at f/u visit  ASSESSMENT: Patient currently experiencing History of depression.   Patient may benefit from psychoeducation and brief therapeutic interventions regarding coping with symptoms of depression and anxiety postpartum .  PLAN: 1. Follow up with behavioral health clinician on : Two weeks 2. Behavioral recommendations:  -Continue taking Prozac as prescribed -Discuss whether or not to take iron pills again at next medical appointment 3. Referral(s): [place] (In Clinic)  I discussed the assessment and treatment plan with the patient and/or parent/guardian. They were provided an opportunity to ask questions and all were answered. They agreed with the plan and demonstrated an understanding of the instructions.   They were advised to call back or seek an in-person evaluation if the symptoms worsen or if the condition fails to improve as anticipated.  Caroleen Hamman [facility]  Depression screen [facility] 2/9 07/31/2019 06/07/2019 05/17/2019 01/18/2019 11/29/2018  Decreased Interest 1 0 1 2 1   Down, Depressed, Hopeless 1 0 1 1 2   PHQ - 2 Score 2 0 2 3 3   Altered sleeping 2 1 2 2 2   Tired, decreased energy 1 1 2 2 1   Change in appetite 0 1 1 1 2   Feeling bad or failure about yourself  0 0 0 0 0  Trouble concentrating 0 0 1 0 0  Moving slowly or fidgety/restless 0 0 0 0 0  Suicidal thoughts 0 0 0 0 0  PHQ-9 Score 5 3 8 8 8   Difficult doing work/chores - - Not difficult at all Not difficult at all -  Some recent data might  be hidden   GAD 7 : Generalized Anxiety Score 07/31/2019 06/07/2019 05/17/2019 01/18/2019  Nervous, Anxious, on Edge 1 0 1 1  Control/stop worrying 1 1 1 1   Worry too much - different things 1 0 1 1  Trouble relaxing 1 1 1 2    Restless 0 0 1 1  Easily annoyed or irritable 1 1 2 1   Afraid - awful might happen 0 0 1 1  Total GAD 7 Score 5 3 8 8   Anxiety Difficulty - - Not difficult at all Not difficult at all

## 2019-08-10 ENCOUNTER — Inpatient Hospital Stay (HOSPITAL_COMMUNITY)
Admission: AD | Admit: 2019-08-10 | Discharge: 2019-08-13 | DRG: 776 | Disposition: A | Discharge status: Home / Self Care | Payer: Medicaid Other | Attending: Obstetrics and Gynecology | Admitting: Obstetrics and Gynecology

## 2019-08-10 ENCOUNTER — Encounter (HOSPITAL_COMMUNITY): Payer: Self-pay | Admitting: Obstetrics and Gynecology

## 2019-08-10 ENCOUNTER — Inpatient Hospital Stay (HOSPITAL_COMMUNITY): Payer: Medicaid Other

## 2019-08-10 DIAGNOSIS — O8602 Infection of obstetric surgical wound, deep incisional site: Secondary | ICD-10-CM | POA: Diagnosis not present

## 2019-08-10 DIAGNOSIS — Z880 Allergy status to penicillin: Secondary | ICD-10-CM | POA: Diagnosis not present

## 2019-08-10 DIAGNOSIS — K8020 Calculus of gallbladder without cholecystitis without obstruction: Secondary | ICD-10-CM | POA: Diagnosis not present

## 2019-08-10 DIAGNOSIS — Z98891 History of uterine scar from previous surgery: Secondary | ICD-10-CM | POA: Diagnosis not present

## 2019-08-10 DIAGNOSIS — O8601 Infection of obstetric surgical wound, superficial incisional site: Secondary | ICD-10-CM | POA: Diagnosis not present

## 2019-08-10 DIAGNOSIS — O8600 Infection of obstetric surgical wound, unspecified: Secondary | ICD-10-CM | POA: Diagnosis present

## 2019-08-10 DIAGNOSIS — O99893 Other specified diseases and conditions complicating puerperium: Secondary | ICD-10-CM | POA: Diagnosis not present

## 2019-08-10 LAB — COMPREHENSIVE METABOLIC PANEL
ALT: 18 U/L (ref 0–44)
AST: 21 U/L (ref 15–41)
Albumin: 1.7 g/dL — ABNORMAL LOW (ref 3.5–5.0)
Alkaline Phosphatase: 113 U/L (ref 38–126)
Anion gap: 13 (ref 5–15)
BUN: 5 mg/dL — ABNORMAL LOW (ref 6–20)
CO2: 26 mmol/L (ref 22–32)
Calcium: 8 mg/dL — ABNORMAL LOW (ref 8.9–10.3)
Chloride: 99 mmol/L (ref 98–111)
Creatinine, Ser: 0.53 mg/dL (ref 0.44–1.00)
GFR calc Af Amer: >60 mL/min (ref >60)
GFR calc non Af Amer: >60 mL/min (ref >60)
Glucose, Bld: 101 mg/dL — ABNORMAL HIGH (ref 70–99)
Potassium: 2.8 mmol/L — ABNORMAL LOW (ref 3.5–5.1)
Sodium: 138 mmol/L (ref 135–145)
Total Bilirubin: 1.3 mg/dL — ABNORMAL HIGH (ref 0.3–1.2)
Total Protein: 4.7 g/dL — ABNORMAL LOW (ref 6.5–8.1)

## 2019-08-10 LAB — CBC WITH DIFFERENTIAL/PLATELET
Abs Immature Granulocytes: 0.33 10*3/uL — ABNORMAL HIGH (ref 0.00–0.07)
Basophils Absolute: 0.1 10*3/uL (ref 0.0–0.1)
Basophils Relative: 0 %
Eosinophils Absolute: 0.1 10*3/uL (ref 0.0–0.5)
Eosinophils Relative: 0 %
HCT: 34.9 % — ABNORMAL LOW (ref 36.0–46.0)
Hemoglobin: 10.3 g/dL — ABNORMAL LOW (ref 12.0–15.0)
Immature Granulocytes: 2 %
Lymphocytes Relative: 8 %
Lymphs Abs: 1.8 10*3/uL (ref 0.7–4.0)
MCH: 24.2 pg — ABNORMAL LOW (ref 26.0–34.0)
MCHC: 29.5 g/dL — ABNORMAL LOW (ref 30.0–36.0)
MCV: 82.1 fL (ref 80.0–100.0)
Monocytes Absolute: 1.1 10*3/uL — ABNORMAL HIGH (ref 0.1–1.0)
Monocytes Relative: 5 %
Neutro Abs: 19.1 10*3/uL — ABNORMAL HIGH (ref 1.7–7.7)
Neutrophils Relative %: 85 %
Platelets: 467 10*3/uL — ABNORMAL HIGH (ref 150–400)
RBC: 4.25 MIL/uL (ref 3.87–5.11)
RDW: 16.8 % — ABNORMAL HIGH (ref 11.5–15.5)
WBC: 22.4 10*3/uL — ABNORMAL HIGH (ref 4.0–10.5)
nRBC: 0 % (ref 0.0–0.2)

## 2019-08-10 LAB — LACTIC ACID, PLASMA: Lactic Acid, Venous: 0.9 mmol/L (ref 0.5–1.9)

## 2019-08-10 MED ORDER — SIMETHICONE 80 MG PO CHEW
80.0000 mg | CHEWABLE_TABLET | Freq: Three times a day (TID) | ORAL | Status: DC
  Administered 2019-08-10 – 2019-08-13 (×10): 80 mg via ORAL
  Filled 2019-08-10 (×10): qty 1

## 2019-08-10 MED ORDER — POTASSIUM CHLORIDE CRYS ER 20 MEQ PO TBCR
40.0000 meq | EXTENDED_RELEASE_TABLET | Freq: Two times a day (BID) | ORAL | Status: AC
  Administered 2019-08-10 – 2019-08-11 (×4): 40 meq via ORAL
  Filled 2019-08-10 (×4): qty 2

## 2019-08-10 MED ORDER — IBUPROFEN 800 MG PO TABS
800.0000 mg | ORAL_TABLET | Freq: Three times a day (TID) | ORAL | Status: AC
  Administered 2019-08-10 – 2019-08-13 (×9): 800 mg via ORAL
  Filled 2019-08-10 (×9): qty 1

## 2019-08-10 MED ORDER — PANTOPRAZOLE SODIUM 40 MG PO TBEC
40.0000 mg | DELAYED_RELEASE_TABLET | Freq: Every day | ORAL | Status: DC
  Administered 2019-08-10 – 2019-08-13 (×4): 40 mg via ORAL
  Filled 2019-08-10 (×4): qty 1

## 2019-08-10 MED ORDER — IOHEXOL 300 MG/ML  SOLN
100.0000 mL | Freq: Once | INTRAMUSCULAR | Status: AC | PRN
  Administered 2019-08-10: 100 mL via INTRAVENOUS

## 2019-08-10 MED ORDER — ENOXAPARIN SODIUM 60 MG/0.6ML ~~LOC~~ SOLN
60.0000 mg | SUBCUTANEOUS | Status: DC
  Administered 2019-08-10 – 2019-08-13 (×4): 60 mg via SUBCUTANEOUS
  Filled 2019-08-10 (×4): qty 0.6

## 2019-08-10 MED ORDER — PRENATAL MULTIVITAMIN CH
1.0000 | ORAL_TABLET | Freq: Every day | ORAL | Status: DC
  Administered 2019-08-11 – 2019-08-13 (×3): 1 via ORAL
  Filled 2019-08-10 (×4): qty 1

## 2019-08-10 MED ORDER — TETANUS-DIPHTH-ACELL PERTUSSIS 5-2.5-18.5 LF-MCG/0.5 IM SUSP: 0.5000 mL | Freq: Once | INTRAMUSCULAR | Status: DC

## 2019-08-10 MED ORDER — GABAPENTIN 100 MG PO CAPS
100.0000 mg | ORAL_CAPSULE | Freq: Three times a day (TID) | ORAL | Status: DC
  Administered 2019-08-10 – 2019-08-13 (×10): 100 mg via ORAL
  Filled 2019-08-10 (×10): qty 1

## 2019-08-10 MED ORDER — LEVOTHYROXINE SODIUM 150 MCG PO TABS
150.0000 ug | ORAL_TABLET | Freq: Every day | ORAL | Status: DC
  Administered 2019-08-10 – 2019-08-13 (×4): 150 ug via ORAL
  Filled 2019-08-10 (×4): qty 1

## 2019-08-10 MED ORDER — VANCOMYCIN HCL 2000 MG/400ML IV SOLN
2000.0000 mg | Freq: Once | INTRAVENOUS | Status: AC
  Administered 2019-08-10: 2000 mg via INTRAVENOUS
  Filled 2019-08-10 (×2): qty 400

## 2019-08-10 MED ORDER — PIPERACILLIN-TAZOBACTAM 3.375 G IVPB 30 MIN
3.3750 g | Freq: Three times a day (TID) | INTRAVENOUS | Status: DC
  Administered 2019-08-10 – 2019-08-13 (×10): 3.375 g via INTRAVENOUS
  Filled 2019-08-10 (×12): qty 50

## 2019-08-10 MED ORDER — OXYCODONE HCL 5 MG PO TABS
5.0000 mg | ORAL_TABLET | ORAL | Status: DC | PRN
  Administered 2019-08-12: 5 mg via ORAL
  Filled 2019-08-10: qty 1

## 2019-08-10 MED ORDER — FLUOXETINE HCL 20 MG PO CAPS
20.0000 mg | ORAL_CAPSULE | Freq: Every day | ORAL | Status: DC
  Administered 2019-08-10 – 2019-08-13 (×4): 20 mg via ORAL
  Filled 2019-08-10: qty 1
  Filled 2019-08-10: qty 2
  Filled 2019-08-10: qty 1
  Filled 2019-08-10 (×2): qty 2
  Filled 2019-08-10 (×2): qty 1

## 2019-08-10 MED ORDER — ACETAMINOPHEN 325 MG PO TABS
650.0000 mg | ORAL_TABLET | Freq: Four times a day (QID) | ORAL | Status: DC | PRN
  Administered 2019-08-10: 650 mg via ORAL
  Filled 2019-08-10: qty 2

## 2019-08-10 MED ORDER — FERROUS SULFATE 325 (65 FE) MG PO TABS
325.0000 mg | ORAL_TABLET | ORAL | Status: DC
  Administered 2019-08-10 – 2019-08-12 (×2): 325 mg via ORAL
  Filled 2019-08-10 (×2): qty 1

## 2019-08-10 MED ORDER — WITCH HAZEL-GLYCERIN EX PADS: 1.0000 "application " | MEDICATED_PAD | CUTANEOUS | Status: DC | PRN

## 2019-08-10 MED ORDER — HYDROMORPHONE HCL 1 MG/ML IJ SOLN
1.0000 mg | INTRAMUSCULAR | Status: DC | PRN
  Administered 2019-08-10 (×2): 1 mg via INTRAVENOUS
  Filled 2019-08-10 (×2): qty 1

## 2019-08-10 MED ORDER — MENTHOL 3 MG MT LOZG: 1.0000 | LOZENGE | OROMUCOSAL | Status: DC | PRN

## 2019-08-10 MED ORDER — ZOLPIDEM TARTRATE 5 MG PO TABS
5.0000 mg | ORAL_TABLET | Freq: Every evening | ORAL | Status: DC | PRN
  Administered 2019-08-10 – 2019-08-13 (×3): 5 mg via ORAL
  Filled 2019-08-10 (×3): qty 1

## 2019-08-10 MED ORDER — VANCOMYCIN HCL IN DEXTROSE 1-5 GM/200ML-% IV SOLN
1000.0000 mg | Freq: Three times a day (TID) | INTRAVENOUS | Status: DC
  Administered 2019-08-10 – 2019-08-13 (×8): 1000 mg via INTRAVENOUS
  Filled 2019-08-10 (×10): qty 200

## 2019-08-10 MED ORDER — DIPHENHYDRAMINE HCL 25 MG PO CAPS
25.0000 mg | ORAL_CAPSULE | Freq: Four times a day (QID) | ORAL | Status: DC | PRN
  Administered 2019-08-10: 25 mg via ORAL
  Filled 2019-08-10: qty 1

## 2019-08-10 MED ORDER — SIMETHICONE 80 MG PO CHEW: 80.0000 mg | CHEWABLE_TABLET | ORAL | Status: DC | PRN

## 2019-08-10 MED ORDER — NIFEDIPINE ER OSMOTIC RELEASE 30 MG PO TB24
30.0000 mg | ORAL_TABLET | Freq: Every day | ORAL | Status: DC
  Administered 2019-08-10 – 2019-08-13 (×4): 30 mg via ORAL
  Filled 2019-08-10 (×4): qty 1

## 2019-08-10 MED ORDER — DIBUCAINE (PERIANAL) 1 % EX OINT: 1.0000 "application " | TOPICAL_OINTMENT | CUTANEOUS | Status: DC | PRN

## 2019-08-10 MED ORDER — OXYCODONE-ACETAMINOPHEN 5-325 MG PO TABS
1.0000 | ORAL_TABLET | ORAL | Status: DC | PRN
  Administered 2019-08-12: 1 via ORAL
  Filled 2019-08-10: qty 1

## 2019-08-10 MED ORDER — SENNOSIDES-DOCUSATE SODIUM 8.6-50 MG PO TABS
2.0000 | ORAL_TABLET | ORAL | Status: DC
  Administered 2019-08-10 – 2019-08-12 (×2): 2 via ORAL
  Filled 2019-08-10 (×2): qty 2

## 2019-08-10 MED ORDER — SIMETHICONE 80 MG PO CHEW
80.0000 mg | CHEWABLE_TABLET | ORAL | Status: DC
  Administered 2019-08-10 – 2019-08-13 (×3): 80 mg via ORAL
  Filled 2019-08-10 (×3): qty 1

## 2019-08-10 MED ORDER — COCONUT OIL OIL: 1.0000 "application " | TOPICAL_OIL | Status: DC | PRN

## 2019-08-10 NOTE — MAU Provider Note (Addendum)
Chief Complaint:  Drainage from Incision   First Provider Initiated Contact with Patient 08/10/19 0356     HPI: Kimberly [name] is a 29 y.o. [id number] who is POD#8 s/p LTCS  presents to maternity admissions reporting gush of purulent fluid from C/S scar.  Was seen in office for dressing removal and was given antibiotics "for the rash between my legs".  .Did have a fever of 101 today. She reports no vaginal itching/burning, urinary symptoms, h/a, dizziness, n/v.    Recent history is remarkable for Triple I/Chorio, Sepsis.  Also had GDM, LGA and Chronic HTN.  Other This is a new (C/S wound leaking ) problem. The current episode started today. The problem occurs constantly. The problem has been unchanged. Associated symptoms include chills and a fever. Pertinent negatives include no abdominal pain, headaches, nausea or vomiting. Nothing aggravates the symptoms. She has tried nothing for the symptoms.   RN Note: .Kimberly [name] is a 29 y.o. here in MAU reporting: A gush from C-section scar. Pt reports she was seen in office today for removal of dressing and was prescribed antibiotics.   Past Medical History: Past Medical History:  Diagnosis Date  . Acanthosis nigricans, acquired 03/01/2011  . Anxiety   . Asthma    "grew out of it" (11/16/2012)  . Chronic back pain   . Complication of anesthesia    "takes a lot to put me to sleep; woke up completely mid endoscopy" (11/16/2012)  . Depression   . Dysrhythmia    ST (11/16/2012)  . Gastric reflux   . 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 Iron deficiency anemia   . Migraines    "weekly" (11/16/2012)  . Oligomenorrhea 03/01/2011  . Ovarian cyst   . Pregnancy induced hypertension   . Schizophrenia ([place])    "moderate" (11/16/2012)  . Scoliosis     Past obstetric history: OB History  Gravida Para Term Preterm AB Living  3 2 2   1 2   SAB TAB Ectopic Multiple Live Births  1     0 2    # Outcome Date GA Lbr Len/2nd  Weight Sex Delivery Anes PTL Lv  3 Term 08/02/19 [redacted]w[redacted]d  3650 g Berenice Bouton EPI  LIV  2 Term 07/31/15 [redacted]w[redacted]d 10:24 / 00:54 3685 g M Vag-Spont EPI  LIV  1 SAB 07/2014 [redacted]w[redacted]d           Past Surgical History: Past Surgical History:  Procedure Laterality Date  . CESAREAN SECTION N/A 08/02/2019   Procedure: CESAREAN SECTION;  Surgeon: Gwynne Edinger, MD;  Location: [facility];  Service: Obstetrics;  Laterality: N/A;  . LAPAROSCOPIC OVARIAN CYSTECTOMY Left 02/11/2016   Procedure: LAPAROSCOPIC OVARIAN CYSTECTOMY;  Surgeon: Emily Filbert, MD;  Location: [place] ORS;  Service: Gynecology;  Laterality: Left;  . UPPER GI ENDOSCOPY  ~ 2006; ~2008    Family History: Family History  Problem Relation Age of Onset  . Diabetes Mother   . Hypertension Mother   . Vision loss Mother   . Hypertension Father   . Hyperlipidemia Father   . Thyroid disease Maternal Grandmother     Social History: Social History   Tobacco Use  . Smoking status: Never Smoker  . Smokeless tobacco: Never Used  Vaping Use  . Vaping Use: Never used  Substance Use Topics  . Alcohol use: Not Currently    Comment: very rare  . Drug use: No    Allergies:  Allergies  Allergen Reactions  .  Cold & Cough Daytime [Acetaminophen-Dm] Shortness Of Breath    Could not tolerate- orange liquid, may have contained Pseudoephedrine- "I could not tolerate"  . Sulfa Antibiotics Swelling  . Zofran [Ondansetron] Other (See Comments)    Muscle Spasms  . Abilify [Aripiprazole] Other (See Comments)    Lethargy, unable to function  . Amoxicillin-Pot Clavulanate Other (See Comments)    Stomach pains Has patient had a PCN reaction causing immediate rash, facial/tongue/throat swelling, SOB or lightheadedness with hypotension: No Has patient had a PCN reaction causing severe rash involving mucus membranes or skin necrosis: No Has patient had a PCN reaction that required hospitalization No Has patient had a PCN reaction occurring within the last 10  years: Yes If all of the above answers are "NO", then may proceed with Cephalosporin use.   . Cephalexin Other (See Comments)    Stomach pains Pt taking Cefadroxil as outpatient  . Ferrous Sulfate Nausea And Vomiting  . Latex Swelling and Other (See Comments)    Burning of skin, too  . Tape Other (See Comments)    Swelling, burning of skin .   Marland Kitchen Zicam Cold Remedy [Homeopathic Products] Nausea And Vomiting  . Keflex [Cephalexin] Rash and Other (See Comments)    Rash--able to take w/ Benadryl  . Vicodin [Hydrocodone-Acetaminophen] Hives and Rash    Per pt- makes her very sleepy, feels like she isn't getting her breath. States she does not have throat/tongue swelling or anaphylaxis.    Meds:  Medications Prior to Admission  Medication Sig Dispense Refill Last Dose  . clindamycin (CLEOCIN) 300 MG capsule Take 1 capsule (300 mg total) by mouth 3 (three) times daily. 21 capsule 0 08/09/2019 at Unknown time  . ferrous sulfate 325 (65 FE) MG tablet Take 1 tablet (325 mg total) by mouth every other day. 30 tablet 3 Past Week at Unknown time  . ibuprofen (ADVIL) 800 MG tablet Take 1 tablet (800 mg total) by mouth 3 (three) times daily. 30 tablet 0 08/09/2019 at Unknown time  . levothyroxine (SYNTHROID) 150 MCG tablet Take 1 tablet (150 mcg total) by mouth daily. 90 tablet 3 Past Week at Unknown time  . NIFEdipine (PROCARDIA-XL/NIFEDICAL-XL) 30 MG 24 hr tablet Take 1 tablet (30 mg total) by mouth daily. Can increase to twice a day as needed for symptomatic contractions 30 tablet 2 08/09/2019 at Unknown time  . FLUoxetine (PROZAC) 20 MG capsule Take 1 capsule (20 mg total) by mouth daily. 30 capsule 3   . oxyCODONE (OXY IR/ROXICODONE) 5 MG immediate release tablet Take 1-2 tablets (5-10 mg total) by mouth every 4 (four) hours as needed for moderate pain. 30 tablet 0   . Prenatal Vit-Fe Fumarate-FA (PRENATAL VITAMIN PLUS LOW IRON) 27-1 MG TABS Take 1 tablet by mouth daily. 30 tablet 9   . promethazine  (PHENERGAN) 25 MG tablet Take 0.5-1 tablets (12.5-25 mg total) by mouth every 6 (six) hours as needed for nausea or vomiting. (Patient not taking: Reported on 04/12/2019) 30 tablet 11     I have reviewed patient's Past Medical Hx, Surgical Hx, Family Hx, Social Hx, medications and allergies.  ROS:  Review of Systems  Constitutional: Positive for chills and fever.  Eyes: Negative for visual disturbance.  Respiratory: Negative for shortness of breath.   Gastrointestinal: Negative for abdominal pain, constipation, diarrhea, nausea and vomiting.       Drainage from C/S wound  Genitourinary: Negative for difficulty urinating, pelvic pain and vaginal bleeding.  Neurological: Negative for dizziness  and headaches.   Other systems negative     Physical Exam   Patient Vitals for the past 24 hrs:  Temp Temp src Resp  08/10/19 0346 99.8 F (37.7 C) Oral 17   Constitutional: Well-developed, well-nourished female in no acute distress.  Cardiovascular: normal rate and rhythm Respiratory: normal effort, no distress.   GI: Abd soft, non-tender.  Nondistended.  No rebound, No guarding.         Cesarean incision is approximated except for two small areas one on each side.  Right side has copious drainage of purulent liquid mixed with oily yellow/bile colored liquid and gas bubbles. There is peau d'orange appearance with areas of induration and erethema.  MS: Extremities nontender, no edema, normal ROM Neurologic: Alert and oriented x 4.   Grossly nonfocal. GU: Neg CVAT. Skin:  Warm and Dry Psych:  Affect appropriate.  PELVIC EXAM: deferred        Labs: Results for orders placed or performed during the hospital encounter of 08/10/19 (from the past 24 hour(s))  CBC with Differential/Platelet     Status: Abnormal   Collection Time: 08/10/19  5:24 AM  Result Value Ref Range   WBC 22.4 (H) 4.0 - 10.5 K/uL   RBC 4.25 3.87 - 5.11 MIL/uL   Hemoglobin 10.3 (L) 12.0 - 15.0 g/dL   HCT 34.9  (L) 36 - 46 %   MCV 82.1 80.0 - 100.0 fL   MCH 24.2 (L) 26.0 - 34.0 pg   MCHC 29.5 (L) 30.0 - 36.0 g/dL   RDW 16.8 (H) 11.5 - 15.5 %   Platelets 467 (H) 150 - 400 K/uL   nRBC 0.0 0.0 - 0.2 %   Neutrophils Relative % 85 %   Neutro Abs 19.1 (H) 1.7 - 7.7 K/uL   Lymphocytes Relative 8 %   Lymphs Abs 1.8 0.7 - 4.0 K/uL   Monocytes Relative 5 %   Monocytes Absolute 1.1 (H) 0 - 1 K/uL   Eosinophils Relative 0 %   Eosinophils Absolute 0.1 0 - 0 K/uL   Basophils Relative 0 %   Basophils Absolute 0.1 0 - 0 K/uL   Immature Granulocytes 2 %   Abs Immature Granulocytes 0.33 (H) 0.00 - 0.07 K/uL  Lactic acid, plasma     Status: None   Collection Time: 08/10/19  5:24 AM  Result Value Ref Range   Lactic Acid, Venous 0.9 0.5 - 1.9 mmol/L    --/--/A POS (07/13 1517)  Imaging:    MAU Course/MDM: I have ordered labs as follows: CBC/diff, CMET, Lactic Acid, aerobic and anaerobic wound cultures Imaging ordered: Abd/Pelvic CT with contrast to evaluated depth of abscess  Consult Dr Elly Modena who recommends admission for eval and antibiotics. .   Treatments in MAU included wound cleansing, CT scan, IV fluids.   Pt stable at time of transfer  Assessment: PostOperative Day #8 Post Cesarean Delivery Wound abscess and cellulitis Fever (101 at home) History of Sepsis during peripartum  Plan: Admit to [facility] Abd/Pelvic CT Dr Elly Modena will put in orders MD to follow   Hansel Feinstein CNM, MSN Certified Nurse-Midwife 08/10/2019 3:57 AM

## 2019-08-10 NOTE — Progress Notes (Signed)
POD 8 s/p CS, admitted for wound infection  Subjective: Patient reports incisional pain, tolerating PO and no problems voiding.    Objective: I have reviewed patient's vital signs, intake and output and radiology results.  General: alert, cooperative, no distress and mildly obese Resp: effort normal Extremities: extremities normal, atraumatic, no cyanosis or edema Incision with erythema and induration on the right with purulent drainage, suture clipped and 2.5 cm opening created and wound probed to fascia, packed with 4x4 with saline Assessment: s/p CS with wound infection  post-op fever Possible intraabdominal infection as well as wound infection, CT result and images reviewed Plan: Encourage ambulation Continue ABX therapy due to Post-op infection  Wound care  LOS: 0 days    Kimberly [name] 08/10/2019, 9:32 AM

## 2019-08-10 NOTE — Consult Note (Addendum)
[facility] Nurse Consult Note: Reason for Consult:Infection to c-section surgical site.  Right distal segment is indurated and opened with purulence oozing from this area.  Wound type: infectious, surgical Pressure Injury POA: NA Measurement: 2 cm x 2.5 cm x 4 cm Wound TSV:[id number] to visualize Drainage (amount, consistency, odor) moderate serosanguinous with initial purulence  Periwound: c section incision 08/02/19  Dressing procedure/placement/frequency:  I have reached out social work to determine if NPWT would be available at home.  In the meantime, will implement topical orders.  Cleanse right side c section incision with NS and pat gently dry.  Pack open segment with NS moist kerlix.  Cover with dry gauze and secure with ABD pad and tape.  Change daily.  Will not follow at this time.  Please re-consult if needed.  Domenic Moras MSN, RN, FNP-BC CWON Wound, Ostomy, Continence Nurse Pager [phone]

## 2019-08-10 NOTE — H&P (Addendum)
Chief Complaint:  Drainage from Incision   First Provider Initiated Contact with Patient 08/10/19 0356     HPI: Kimberly [name] is a 29 y.o. [id number] who is POD#8 s/p LTCS  presents to maternity admissions reporting gush of purulent fluid from C/S scar.  Was seen in office for dressing removal and was given antibiotics "for the rash between my legs".  .Did have a fever of 101 today. She reports no vaginal itching/burning, urinary symptoms, h/a, dizziness, n/v.    Recent history is remarkable for Triple I/Chorio, Sepsis.  Also had GDM, LGA and Chronic HTN.  Other This is a new (C/S wound leaking ) problem. The current episode started today. The problem occurs constantly. The problem has been unchanged. Associated symptoms include chills and a fever. Pertinent negatives include no abdominal pain, headaches, nausea or vomiting. Nothing aggravates the symptoms. She has tried nothing for the symptoms.   RN Note: .Kimberly [name] is a 29 y.o. here in MAU reporting: A gush from C-section scar. Pt reports she was seen in office today for removal of dressing and was prescribed antibiotics.   Past Medical History: Past Medical History:  Diagnosis Date  . Acanthosis nigricans, acquired 03/01/2011  . Anxiety   . Asthma    "grew out of it" (11/16/2012)  . Chronic back pain   . Complication of anesthesia    "takes a lot to put me to sleep; woke up completely mid endoscopy" (11/16/2012)  . Depression   . Dysrhythmia    ST (11/16/2012)  . Gastric reflux   . 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 Iron deficiency anemia   . Migraines    "weekly" (11/16/2012)  . Oligomenorrhea 03/01/2011  . Ovarian cyst   . Pregnancy induced hypertension   . Schizophrenia ([place])    "moderate" (11/16/2012)  . Scoliosis     Past obstetric history: OB History  Gravida Para Term Preterm AB Living  3 2 2   1 2   SAB TAB Ectopic Multiple Live Births  1     0 2    # Outcome Date GA Lbr Len/2nd  Weight Sex Delivery Anes PTL Lv  3 Term 08/02/19 [redacted]w[redacted]d  3650 g Berenice Bouton EPI  LIV  2 Term 07/31/15 [redacted]w[redacted]d 10:24 / 00:54 3685 g M Vag-Spont EPI  LIV  1 SAB 07/2014 [redacted]w[redacted]d           Past Surgical History: Past Surgical History:  Procedure Laterality Date  . CESAREAN SECTION N/A 08/02/2019   Procedure: CESAREAN SECTION;  Surgeon: Gwynne Edinger, MD;  Location: [facility] LD ORS;  Service: Obstetrics;  Laterality: N/A;  . LAPAROSCOPIC OVARIAN CYSTECTOMY Left 02/11/2016   Procedure: LAPAROSCOPIC OVARIAN CYSTECTOMY;  Surgeon: Emily Filbert, MD;  Location: [facility] ORS;  Service: Gynecology;  Laterality: Left;  . UPPER GI ENDOSCOPY  ~ 2006; ~2008    Family History: Family History  Problem Relation Age of Onset  . Diabetes Mother   . Hypertension Mother   . Vision loss Mother   . Hypertension Father   . Hyperlipidemia Father   . Thyroid disease Maternal Grandmother     Social History: Social History   Tobacco Use  . Smoking status: Never Smoker  . Smokeless tobacco: Never Used  Vaping Use  . Vaping Use: Never used  Substance Use Topics  . Alcohol use: Not Currently    Comment: very rare  . Drug use: No    Allergies:  Allergies  Allergen Reactions  .  Cold & Cough Daytime [Acetaminophen-Dm] Shortness Of Breath    Could not tolerate- orange liquid, may have contained Pseudoephedrine- "I could not tolerate"  . Sulfa Antibiotics Swelling  . Zofran [Ondansetron] Other (See Comments)    Muscle Spasms  . Abilify [Aripiprazole] Other (See Comments)    Lethargy, unable to function  . Amoxicillin-Pot Clavulanate Other (See Comments)    Stomach pains Has patient had a PCN reaction causing immediate rash, facial/tongue/throat swelling, SOB or lightheadedness with hypotension: No Has patient had a PCN reaction causing severe rash involving mucus membranes or skin necrosis: No Has patient had a PCN reaction that required hospitalization No Has patient had a PCN reaction occurring within the last 10  years: Yes If all of the above answers are "NO", then may proceed with Cephalosporin use.   . Cephalexin Other (See Comments)    Stomach pains Pt taking Cefadroxil as outpatient  . Ferrous Sulfate Nausea And Vomiting  . Latex Swelling and Other (See Comments)    Burning of skin, too  . Tape Other (See Comments)    Swelling, burning of skin .   Marland Kitchen Zicam Cold Remedy [Homeopathic Products] Nausea And Vomiting  . Keflex [Cephalexin] Rash and Other (See Comments)    Rash--able to take w/ Benadryl  . Vicodin [Hydrocodone-Acetaminophen] Hives and Rash    Per pt- makes her very sleepy, feels like she isn't getting her breath. States she does not have throat/tongue swelling or anaphylaxis.    Meds:  Medications Prior to Admission  Medication Sig Dispense Refill Last Dose  . clindamycin (CLEOCIN) 300 MG capsule Take 1 capsule (300 mg total) by mouth 3 (three) times daily. 21 capsule 0 08/09/2019 at Unknown time  . ferrous sulfate 325 (65 FE) MG tablet Take 1 tablet (325 mg total) by mouth every other day. 30 tablet 3 Past Week at Unknown time  . ibuprofen (ADVIL) 800 MG tablet Take 1 tablet (800 mg total) by mouth 3 (three) times daily. 30 tablet 0 08/09/2019 at Unknown time  . levothyroxine (SYNTHROID) 150 MCG tablet Take 1 tablet (150 mcg total) by mouth daily. 90 tablet 3 Past Week at Unknown time  . NIFEdipine (PROCARDIA-XL/NIFEDICAL-XL) 30 MG 24 hr tablet Take 1 tablet (30 mg total) by mouth daily. Can increase to twice a day as needed for symptomatic contractions 30 tablet 2 08/09/2019 at Unknown time  . FLUoxetine (PROZAC) 20 MG capsule Take 1 capsule (20 mg total) by mouth daily. 30 capsule 3   . oxyCODONE (OXY IR/ROXICODONE) 5 MG immediate release tablet Take 1-2 tablets (5-10 mg total) by mouth every 4 (four) hours as needed for moderate pain. 30 tablet 0   . Prenatal Vit-Fe Fumarate-FA (PRENATAL VITAMIN PLUS LOW IRON) 27-1 MG TABS Take 1 tablet by mouth daily. 30 tablet 9   . promethazine  (PHENERGAN) 25 MG tablet Take 0.5-1 tablets (12.5-25 mg total) by mouth every 6 (six) hours as needed for nausea or vomiting. (Patient not taking: Reported on 04/12/2019) 30 tablet 11     I have reviewed patient's Past Medical Hx, Surgical Hx, Family Hx, Social Hx, medications and allergies.  ROS:  Review of Systems  Constitutional: Positive for chills and fever.  Eyes: Negative for visual disturbance.  Respiratory: Negative for shortness of breath.   Gastrointestinal: Negative for abdominal pain, constipation, diarrhea, nausea and vomiting.       Drainage from C/S wound  Genitourinary: Negative for difficulty urinating, pelvic pain and vaginal bleeding.  Neurological: Negative for dizziness  and headaches.   Other systems negative     Physical Exam   Patient Vitals for the past 24 hrs:  Temp Temp src Resp  08/10/19 0346 99.8 F (37.7 C) Oral 17   Constitutional: Well-developed, well-nourished female in no acute distress.  Cardiovascular: normal rate and rhythm Respiratory: normal effort, no distress.   GI: Abd soft, non-tender.  Nondistended.  No rebound, No guarding.         Cesarean incision is approximated except for two small areas one on each side.  Right side has copious drainage of purulent liquid mixed with oily yellow/bile colored liquid and gas bubbles. There is peau d'orange appearance with areas of induration and erethema.  MS: Extremities nontender, no edema, normal ROM Neurologic: Alert and oriented x 4.   Grossly nonfocal. GU: Neg CVAT. Skin:  Warm and Dry Psych:  Affect appropriate.  PELVIC EXAM: deferred        Labs: Results for orders placed or performed during the hospital encounter of 08/10/19 (from the past 24 hour(s))  CBC with Differential/Platelet     Status: Abnormal   Collection Time: 08/10/19  5:24 AM  Result Value Ref Range   WBC 22.4 (H) 4.0 - 10.5 K/uL   RBC 4.25 3.87 - 5.11 MIL/uL   Hemoglobin 10.3 (L) 12.0 - 15.0 g/dL   HCT 34.9  (L) 36 - 46 %   MCV 82.1 80.0 - 100.0 fL   MCH 24.2 (L) 26.0 - 34.0 pg   MCHC 29.5 (L) 30.0 - 36.0 g/dL   RDW 16.8 (H) 11.5 - 15.5 %   Platelets 467 (H) 150 - 400 K/uL   nRBC 0.0 0.0 - 0.2 %   Neutrophils Relative % 85 %   Neutro Abs 19.1 (H) 1.7 - 7.7 K/uL   Lymphocytes Relative 8 %   Lymphs Abs 1.8 0.7 - 4.0 K/uL   Monocytes Relative 5 %   Monocytes Absolute 1.1 (H) 0 - 1 K/uL   Eosinophils Relative 0 %   Eosinophils Absolute 0.1 0 - 0 K/uL   Basophils Relative 0 %   Basophils Absolute 0.1 0 - 0 K/uL   Immature Granulocytes 2 %   Abs Immature Granulocytes 0.33 (H) 0.00 - 0.07 K/uL  Lactic acid, plasma     Status: None   Collection Time: 08/10/19  5:24 AM  Result Value Ref Range   Lactic Acid, Venous 0.9 0.5 - 1.9 mmol/L    --/--/A POS (07/13 9629)  Imaging:    MAU Course/MDM: I have ordered labs as follows: CBC/diff, CMET, Lactic Acid, aerobic and anaerobic wound cultures Imaging ordered: Abd/Pelvic CT with contrast to evaluated depth of abscess  Consult Dr Elly Modena who recommends admission for eval and antibiotics. .   Treatments in MAU included wound cleansing, CT scan, IV fluids.   Pt stable at time of transfer  Assessment: PostOperative Day #8 Post Cesarean Delivery Wound abscess and cellulitis Fever (101 at home) History of Sepsis during peripartum Hypokalemia  Plan: Admit to [facility] Abd/Pelvic CT Dr Elly Modena will put in orders Start K-Dur 49mEq bid x 4 doses MD to follow   Hansel Feinstein CNM, MSN Certified Nurse-Midwife 08/10/2019 3:57 AM

## 2019-08-10 NOTE — Progress Notes (Addendum)
ANTIBIOTIC CONSULT NOTE - INITIAL  Pharmacy Consult for Vancomycin Indication:C-section incision wound infection   Patient Measurements: Height: 5\' 4"  (162.6 cm) Weight: 114.8 kg (253 lb) (taken on 08/09/19)  Labs: No results for input(s): PROCALCITON in the last 168 hours.   Recent Labs    08/10/19 0524  WBC 22.4*  PLT 467*  CREATININE 0.53   No results for input(s): GENTTROUGH, GENTPEAK, GENTRANDOM, VANCOTROUGH, VANCOPEAK, VANCORANDOM in the last 72 hours.  Microbiology: Recent Results (from the past 720 hour(s))  Strep Gp B Culture+Rflx     Status: None   Collection Time: 07/31/19  9:20 AM   Specimen: Genital   VR  Result Value Ref Range Status   Strep Gp B Culture+Rflx Negative Negative Final    Comment: Centers for Disease Control and Prevention (CDC) and American Congress of Obstetricians and Gynecologists (ACOG) guidelines for prevention of perinatal group B streptococcal (GBS) disease specify co-collection of a vaginal and rectal swab specimen to maximize sensitivity of GBS detection. Per the CDC and ACOG, swabbing both the lower vagina and rectum substantially increases the yield of detection compared with sampling the vagina alone. Penicillin G, ampicillin, or cefazolin are indicated for intrapartum prophylaxis of perinatal GBS colonization. Reflex susceptibility testing should be performed prior to use of clindamycin only on GBS isolates from penicillin-allergic women who are considered a high risk for anaphylaxis. Treatment with vancomycin without additional testing is warranted if resistance to clindamycin is noted.   SARS Coronavirus 2 by RT PCR (hospital order, performed in [facility] hospital lab) Nasopharyngeal Nasopharyngeal Swab     Status: None   Collection Time: 08/01/19  7:52 AM   Specimen: Nasopharyngeal Swab  Result Value Ref Range Status   SARS Coronavirus 2 NEGATIVE NEGATIVE Final    Comment: (NOTE) SARS-CoV-2 target nucleic acids are NOT  DETECTED.  The SARS-CoV-2 RNA is generally detectable in upper and lower respiratory specimens during the acute phase of infection. The lowest concentration of SARS-CoV-2 viral copies this assay can detect is 250 copies / mL. A negative result does not preclude SARS-CoV-2 infection and should not be used as the sole basis for treatment or other patient management decisions.  A negative result may occur with improper specimen collection / handling, submission of specimen other than nasopharyngeal swab, presence of viral mutation(s) within the areas targeted by this assay, and inadequate number of viral copies (<250 copies / mL). A negative result must be combined with clinical observations, patient history, and epidemiological information.  Fact Sheet for Patients:   StrictlyIdeas.no  Fact Sheet for Healthcare Providers: BankingDealers.co.za  This test is not yet approved or  cleared by the [place] FDA and has been authorized for detection and/or diagnosis of SARS-CoV-2 by FDA under an Emergency Use Authorization (EUA).  This EUA will remain in effect (meaning this test can be used) for the duration of the COVID-19 declaration under Section 564(b)(1) of the Act, 21 U.S.C. section 360bbb-3(b)(1), unless the authorization is terminated or revoked sooner.  Performed at [facility], [place] [address], [place], [place] [address]   Group B strep by PCR     Status: None   Collection Time: 08/01/19  8:59 AM   Specimen: Vaginal/Rectal; Genital  Result Value Ref Range Status   Group B strep by PCR NEGATIVE NEGATIVE Final    Comment: (NOTE) Intrapartum testing with Xpert GBS assay should be used as an adjunct to other methods available and not used to replace antepartum testing (at 35-[redacted]  weeks gestation). Performed at [facility], [place] [address], [place], [place] [address]   Culture, blood (x 2)     Status: None    Collection Time: 08/02/19 11:05 AM   Specimen: BLOOD  Result Value Ref Range Status   Specimen Description BLOOD LEFT ANTECUBITAL  Final   Special Requests   Final    BOTTLES DRAWN AEROBIC ONLY Blood Culture adequate volume   Culture   Final    NO GROWTH 5 DAYS Performed at [facility], [place] [address], [place], [place] [address]    Report Status 08/07/2019 FINAL  Final  Culture, blood (x 2)     Status: None   Collection Time: 08/02/19 11:10 AM   Specimen: BLOOD LEFT HAND  Result Value Ref Range Status   Specimen Description BLOOD LEFT HAND  Final   Special Requests   Final    BOTTLES DRAWN AEROBIC ONLY Blood Culture results may not be optimal due to an inadequate volume of blood received in culture bottles   Culture   Final    NO GROWTH 5 DAYS Performed at [facility], [place] [address], [place], [place] [address]    Report Status 08/07/2019 FINAL  Final  Aerobic/Anaerobic Culture (surgical/deep wound)     Status: None (Preliminary result)   Collection Time: 08/10/19  4:28 AM   Specimen: Wound; Abscess  Result Value Ref Range Status   Specimen Description WOUND  Final   Special Requests CESAREAN SECTION  Final   Gram Stain   Final    ABUNDANT WBC PRESENT, PREDOMINANTLY PMN MODERATE GRAM NEGATIVE RODS RARE GRAM POSITIVE COCCI RARE GRAM POSITIVE RODS Performed at [facility], [address] [place], [place] [address]    Culture PENDING  Incomplete   Report Status PENDING  Incomplete    Medications:  Zosyn 3.375gm  IV Q8hr ( infused over 4 hrs) Vancomycin 2000 mg IV x 1 on 7/22 at 1338  (load dose)  Goal of Therapy:  Vancomycin Trough 10-15 mg/L  (only troughs will be obtained due to reagent shortage)  Plan:  Vancomycin 1000 mg IV Q 8 hrs to start at 2200 on 08/10/2019 Will monitor renal function and follow cultures.  Wyline Mood 08/10/2019,1:28 PM

## 2019-08-10 NOTE — MAU Note (Signed)
..  Kimberly [name] is a 29 y.o. here in MAU reporting: A gush from C-section scar. Pt reports she was seen in office today for removal of dressing and was prescribed antibiotics.

## 2019-08-10 NOTE — Progress Notes (Signed)
Pt picked up by transport to go to CT scan

## 2019-08-11 MED ORDER — LORAZEPAM 0.5 MG PO TABS
1.0000 mg | ORAL_TABLET | Freq: Four times a day (QID) | ORAL | Status: DC | PRN
  Administered 2019-08-11 – 2019-08-12 (×3): 1 mg via ORAL
  Filled 2019-08-11 (×3): qty 2

## 2019-08-11 NOTE — Progress Notes (Signed)
CSW received phone call from NP, Domenic Moras, regarding [facility] services. CSW spoke with [facility], [facility] and [facility] regarding accepting patient. Patient denied from all three. CSW has updated NP regarding responses.  Elijio Miles, LCSW [facility] [employer] [phone]

## 2019-08-11 NOTE — Consult Note (Signed)
[facility] Nurse wound follow up Wound type:dehisced c section site.  Topical wound care orders are NS moist kerlix and dry dressing/tape. Inquired about NPWT-VAC therapy at home after discharge (See SW notes) and there was no available [facility] services at this time.  Patient has a family member that can help her daily with NS moist dressings.  No further WOC needs at this time.  Measurement:see 08/10/19 Will not follow at this time.  Please re-consult if needed.  Domenic Moras MSN, RN, FNP-BC CWON Wound, Ostomy, Continence Nurse Pager [phone]

## 2019-08-11 NOTE — Progress Notes (Signed)
Subjective: Patient reports incisional pain and tolerating PO.    Objective: I have reviewed patient's vital signs and medications. Blood pressure (!) 124/86, pulse 95, temperature 98.2 F (36.8 C), temperature source Oral, resp. rate 18, height 5\' 4"  (1.626 m), weight 114.8 kg, SpO2 98 %, unknown if currently breastfeeding.  General: alert, cooperative and no distress Resp: effort normal Dressing dry, abdomen soft  CBC    Component Value Date/Time   WBC 22.4 (H) 08/10/2019 0524   RBC 4.25 08/10/2019 0524   HGB 10.3 (L) 08/10/2019 0524   HGB 9.5 (L) 05/17/2019 0833   HCT 34.9 (L) 08/10/2019 0524   HCT 30.2 (L) 05/17/2019 0833   PLT 467 (H) 08/10/2019 0524   PLT 307 05/17/2019 0833   MCV 82.1 08/10/2019 0524   MCV 79 05/17/2019 0833   MCH 24.2 (L) 08/10/2019 0524   MCHC 29.5 (L) 08/10/2019 0524   RDW 16.8 (H) 08/10/2019 0524   RDW 15.0 05/17/2019 0833   LYMPHSABS 1.8 08/10/2019 0524   LYMPHSABS 2.5 01/18/2019 1418   MONOABS 1.1 (H) 08/10/2019 0524   EOSABS 0.1 08/10/2019 0524   EOSABS 0.1 01/18/2019 1418   BASOSABS 0.1 08/10/2019 0524   BASOSABS 0.0 01/18/2019 1418    Assessment/Plan: Continue antibiotic, wound care. Repeat labs in AM  LOS: 1 day    Emeterio Reeve 08/11/2019, 2:49 PM

## 2019-08-12 DIAGNOSIS — Z98891 History of uterine scar from previous surgery: Secondary | ICD-10-CM | POA: Diagnosis not present

## 2019-08-12 DIAGNOSIS — O8602 Infection of obstetric surgical wound, deep incisional site: Secondary | ICD-10-CM | POA: Diagnosis not present

## 2019-08-12 LAB — CBC
HCT: 33.2 % — ABNORMAL LOW (ref 36.0–46.0)
Hemoglobin: 9.7 g/dL — ABNORMAL LOW (ref 12.0–15.0)
MCH: 24.1 pg — ABNORMAL LOW (ref 26.0–34.0)
MCHC: 29.2 g/dL — ABNORMAL LOW (ref 30.0–36.0)
MCV: 82.4 fL (ref 80.0–100.0)
Platelets: 482 10*3/uL — ABNORMAL HIGH (ref 150–400)
RBC: 4.03 MIL/uL (ref 3.87–5.11)
RDW: 16.7 % — ABNORMAL HIGH (ref 11.5–15.5)
WBC: 11.4 10*3/uL — ABNORMAL HIGH (ref 4.0–10.5)
nRBC: 0 % (ref 0.0–0.2)

## 2019-08-12 LAB — WOUND CULTURE

## 2019-08-12 NOTE — Progress Notes (Signed)
Abdominal dressing changed. Significant other assisted with dressing change. Significant other verbalized understanding and was able to teach back method of wet to dry. Patient tolerated dressing change well. See flowsheets.

## 2019-08-12 NOTE — Progress Notes (Signed)
Subjective: Postpartum Day 10 after readmission for wound infection s/p Cesarean Delivery Patient reports that she feels much better, and is interested in going home Sunday after another day of IV antibiotics. She might be in my opinion able to be d/c'd today, but feels more comfortable with another day of IV medication. Wound care is not available, but I have shown her partner how to dress the wound and he is comfortable with the process, is shown to use gloves, pack the upper portion of the wound and apply ABD pad and tape in place. Objective: Vital signs in last 24 hours: Temp:  [98.2 F (36.8 C)-98.5 F (36.9 C)] 98.2 F (36.8 C) (07/24 0801) Pulse Rate:  [95-113] 103 (07/24 0801) Resp:  [18-20] 18 (07/24 0801) BP: (116-142)/(73-95) 135/88 (07/24 0801) SpO2:  [94 %-99 %] 94 % (07/24 0801)  Physical Exam:  General: alert, no distress Lochia: appropriate Uterine Fundus: firm Incision: separation 2 cm wide x 2 cm deep pyramidal. Massage: no deep defect. DVT Evaluation: No evidence of DVT seen on physical exam.  Recent Labs    08/10/19 0524 08/12/19 0509  HGB 10.3* 9.7*  HCT 34.9* 33.2*   CBC Latest Ref Rng & Units 08/12/2019 08/10/2019 08/05/2019  WBC 4.0 - 10.5 K/uL 11.4(H) 22.4(H) 14.7(H)  Hemoglobin 12.0 - 15.0 g/dL 9.7(L) 10.3(L) 9.8(L)  Hematocrit 36 - 46 % 33.2(L) 34.9(L) 32.2(L)  Platelets 150 - 400 K/uL 482(H) 467(H) 278    Assessment/Plan: Status post Cesarean section. Postoperative course complicated by wound infection on zosyn,  and Vanc. One more day of IV antibiotics, then d/c home on PO meds.. Wound care today by nursing and assistance by partner.  Jonnie Kind 08/12/2019, 9:12 AM

## 2019-08-13 LAB — AEROBIC/ANAEROBIC CULTURE W GRAM STAIN (SURGICAL/DEEP WOUND)

## 2019-08-13 MED ORDER — NIFEDIPINE ER 30 MG PO TB24: 30.0000 mg | ORAL_TABLET | Freq: Every day | ORAL | 1 refills | Status: DC

## 2019-08-13 MED ORDER — OXYCODONE-ACETAMINOPHEN 5-325 MG PO TABS: 1.0000 | ORAL_TABLET | ORAL | 0 refills | Status: DC | PRN

## 2019-08-13 MED ORDER — CIPROFLOXACIN HCL 500 MG PO TABS
500.0000 mg | ORAL_TABLET | Freq: Two times a day (BID) | ORAL | 0 refills | Status: DC
Start: 2019-08-13 — End: 2019-08-21

## 2019-08-13 NOTE — Discharge Summary (Signed)
Physician Discharge Summary  Patient ID: Kimberly [name] MRN: [id number] DOB/AGE: [birth year] 29 y.o.  Admit date: 08/10/2019 Discharge date: 08/13/2019  Admission Diagnoses:postpperative wound infection  Discharge Diagnoses:  Active Problems:   Cesarean wound infection   Discharged Condition: fair  Hospital Course: Chief Complaint:  Drainage from Incision   First Provider Initiated Contact with Patient 08/10/19 0356     HPI: Kimberly [name] is a 29 y.o. [id number] who is POD#8 s/p LTCS  presents to maternity admissions reporting gush of purulent fluid from C/S scar.  Was seen in office for dressing removal and was given antibiotics "for the rash between my legs".  .Did have a fever of 101 today. She reports no vaginal itching/burning, urinary symptoms, h/a, dizziness, n/v.    Recent history is remarkable for Triple I/Chorio, Sepsis.  Also had GDM, LGA and Chronic HTN.  Other This is a new (C/S wound leaking ) problem. The current episode started today. The problem occurs constantly. The problem has been unchanged. Associated symptoms include chills and a fever. Pertinent negatives include no abdominal pain, headaches, nausea or vomiting. Nothing aggravates the symptoms. She has tried nothing for the symptoms.   RN Note: .Kimberly Liao Simmonsis a 29 y.o.here in [facility] reporting: A gush from C-section scar. Pt reports she was seen in office today for removal of dressing and was prescribed antibiotics.  Past Medical History:     Past Medical History:  Diagnosis Date  . Acanthosis nigricans, acquired 03/01/2011  . Anxiety   . Asthma    "grew out of it" (11/16/2012)  . Chronic back pain   . Complication of anesthesia    "takes a lot to put me to sleep; woke up completely mid endoscopy" (11/16/2012)  . Depression   . Dysrhythmia    ST (11/16/2012)  . Gastric reflux   . 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 Iron deficiency anemia   . Migraines     "weekly" (11/16/2012)  . Oligomenorrhea 03/01/2011  . Ovarian cyst   . Pregnancy induced hypertension   . Schizophrenia ([place])    "moderate" (11/16/2012)  . Scoliosis     Past obstetric history:                 OB History  Gravida Para Term Preterm AB Living  3 2 2   1 2   SAB TAB Ectopic Multiple Live Births     1     0 2       # Outcome Date GA Lbr Len/2nd Weight Sex Delivery Anes PTL Lv  3 Term 08/02/19 [redacted]w[redacted]d  3650 g Berenice Bouton EPI  LIV  2 Term 07/31/15 [redacted]w[redacted]d 10:24 / 00:54 3685 g M Vag-Spont EPI  LIV  1 SAB 07/2014 [redacted]w[redacted]d           Past Surgical History:      Past Surgical History:  Procedure Laterality Date  . CESAREAN SECTION N/A 08/02/2019   Procedure: CESAREAN SECTION;  Surgeon: Gwynne Edinger, MD;  Location: [facility] LD ORS;  Service: Obstetrics;  Laterality: N/A;  . LAPAROSCOPIC OVARIAN CYSTECTOMY Left 02/11/2016   Procedure: LAPAROSCOPIC OVARIAN CYSTECTOMY;  Surgeon: Emily Filbert, MD;  Location: [facility];  Service: Gynecology;  Laterality: Left;  . UPPER GI ENDOSCOPY  ~ 2006; ~2008    Family History:      Family History  Problem Relation Age of Onset  . Diabetes Mother   . Hypertension Mother   . Vision loss Mother   .  Hypertension Father   . Hyperlipidemia Father   . Thyroid disease Maternal Grandmother     Social History: Social History        Tobacco Use  . Smoking status: Never Smoker  . Smokeless tobacco: Never Used  Vaping Use  . Vaping Use: Never used  Substance Use Topics  . Alcohol use: Not Currently    Comment: very rare  . Drug use: No    Allergies:       Allergies  Allergen Reactions  . Cold & Cough Daytime [Acetaminophen-Dm] Shortness Of Breath    Could not tolerate- orange liquid, may have contained Pseudoephedrine- "I could not tolerate"  . Sulfa Antibiotics Swelling  . Zofran [Ondansetron] Other (See Comments)    Muscle Spasms  . Abilify [Aripiprazole] Other (See Comments)    Lethargy,  unable to function  . Amoxicillin-Pot Clavulanate Other (See Comments)    Stomach pains Has patient had a PCN reaction causing immediate rash, facial/tongue/throat swelling, SOB or lightheadedness with hypotension: No Has patient had a PCN reaction causing severe rash involving mucus membranes or skin necrosis: No Has patient had a PCN reaction that required hospitalization No Has patient had a PCN reaction occurring within the last 10 years: Yes If all of the above answers are "NO", then may proceed with Cephalosporin use.   . Cephalexin Other (See Comments)    Stomach pains Pt taking Cefadroxil as outpatient  . Ferrous Sulfate Nausea And Vomiting  . Latex Swelling and Other (See Comments)    Burning of skin, too  . Tape Other (See Comments)    Swelling, burning of skin .   Marland Kitchen Zicam Cold Remedy [Homeopathic Products] Nausea And Vomiting  . Keflex [Cephalexin] Rash and Other (See Comments)    Rash--able to take w/ Benadryl  . Vicodin [Hydrocodone-Acetaminophen] Hives and Rash    Per pt- makes her very sleepy, feels like she isn't getting her breath. States she does not have throat/tongue swelling or anaphylaxis.    Meds:         Medications Prior to Admission  Medication Sig Dispense Refill Last Dose  . clindamycin (CLEOCIN) 300 MG capsule Take 1 capsule (300 mg total) by mouth 3 (three) times daily. 21 capsule 0 08/09/2019 at Unknown time  . ferrous sulfate 325 (65 FE) MG tablet Take 1 tablet (325 mg total) by mouth every other day. 30 tablet 3 Past Week at Unknown time  . ibuprofen (ADVIL) 800 MG tablet Take 1 tablet (800 mg total) by mouth 3 (three) times daily. 30 tablet 0 08/09/2019 at Unknown time  . levothyroxine (SYNTHROID) 150 MCG tablet Take 1 tablet (150 mcg total) by mouth daily. 90 tablet 3 Past Week at Unknown time  . NIFEdipine (PROCARDIA-XL/NIFEDICAL-XL) 30 MG 24 hr tablet Take 1 tablet (30 mg total) by mouth daily. Can increase to twice a day as needed for  symptomatic contractions 30 tablet 2 08/09/2019 at Unknown time  . FLUoxetine (PROZAC) 20 MG capsule Take 1 capsule (20 mg total) by mouth daily. 30 capsule 3   . oxyCODONE (OXY IR/ROXICODONE) 5 MG immediate release tablet Take 1-2 tablets (5-10 mg total) by mouth every 4 (four) hours as needed for moderate pain. 30 tablet 0   . Prenatal Vit-Fe Fumarate-FA (PRENATAL VITAMIN PLUS LOW IRON) 27-1 MG TABS Take 1 tablet by mouth daily. 30 tablet 9   . promethazine (PHENERGAN) 25 MG tablet Take 0.5-1 tablets (12.5-25 mg total) by mouth every 6 (six) hours as needed for  nausea or vomiting. (Patient not taking: Reported on 04/12/2019) 30 tablet 11     I have reviewed patient's Past Medical Hx, Surgical Hx, Family Hx, Social Hx, medications and allergies.  ROS:  Review of Systems  Constitutional: Positive for chills and fever.  Eyes: Negative for visual disturbance.  Respiratory: Negative for shortness of breath.   Gastrointestinal: Negative for abdominal pain, constipation, diarrhea, nausea and vomiting.       Drainage from C/S wound  Genitourinary: Negative for difficulty urinating, pelvic pain and vaginal bleeding.  Neurological: Negative for dizziness and headaches.   Other systems negative     Physical Exam   Patient Vitals for the past 24 hrs:  Temp Temp src Resp  08/10/19 0346 99.8 F (37.7 C) Oral 17   Constitutional: Well-developed, well-nourished female in no acute distress.  Cardiovascular: normal rate and rhythm Respiratory: normal effort, no distress.   GI: Abd soft, non-tender.  Nondistended.  No rebound, No guarding.         Cesarean incision is approximated except for two small areas one on each side.  Right side has copious drainage of purulent liquid mixed with oily yellow/bile colored liquid and gas bubbles. There is peau d'orange appearance with areas of induration and erethema.  MS: Extremities nontender, no edema, normal ROM Neurologic: Alert and  oriented x 4.   Grossly nonfocal. GU: Neg CVAT. Skin:  Warm and Dry Psych:  Affect appropriate.  PELVIC EXAM: deferred        Labs: Lab Results Last 24 Hours       Results for orders placed or performed during the hospital encounter of 08/10/19 (from the past 24 hour(s))  CBC with Differential/Platelet     Status: Abnormal   Collection Time: 08/10/19  5:24 AM  Result Value Ref Range   WBC 22.4 (H) 4.0 - 10.5 K/uL   RBC 4.25 3.87 - 5.11 MIL/uL   Hemoglobin 10.3 (L) 12.0 - 15.0 g/dL   HCT 34.9 (L) 36 - 46 %   MCV 82.1 80.0 - 100.0 fL   MCH 24.2 (L) 26.0 - 34.0 pg   MCHC 29.5 (L) 30.0 - 36.0 g/dL   RDW 16.8 (H) 11.5 - 15.5 %   Platelets 467 (H) 150 - 400 K/uL   nRBC 0.0 0.0 - 0.2 %   Neutrophils Relative % 85 %   Neutro Abs 19.1 (H) 1.7 - 7.7 K/uL   Lymphocytes Relative 8 %   Lymphs Abs 1.8 0.7 - 4.0 K/uL   Monocytes Relative 5 %   Monocytes Absolute 1.1 (H) 0 - 1 K/uL   Eosinophils Relative 0 %   Eosinophils Absolute 0.1 0 - 0 K/uL   Basophils Relative 0 %   Basophils Absolute 0.1 0 - 0 K/uL   Immature Granulocytes 2 %   Abs Immature Granulocytes 0.33 (H) 0.00 - 0.07 K/uL  Lactic acid, plasma     Status: None   Collection Time: 08/10/19  5:24 AM  Result Value Ref Range   Lactic Acid, Venous 0.9 0.5 - 1.9 mmol/L      --/--/A POS (07/13 8242)  Imaging:    [facility] Course/MDM: I have ordered labs as follows: CBC/diff, CMET, Lactic Acid, aerobic and anaerobic wound cultures Imaging ordered: Abd/Pelvic CT with contrast to evaluated depth of abscess  Consult Dr Elly Modena who recommends admission for eval and antibiotics. .   Treatments in [facility]U included wound cleansing, CT scan, IV fluids.   Pt stable at time of transfer  Assessment: PostOperative Day #8 Post Cesarean Delivery Wound abscess and cellulitis Fever (101 at home) History of Sepsis during peripartum Hypokalemia  Plan: Admit to [facility] Abd/Pelvic CT Dr  Elly Modena will put in orders Start K-Dur 21mEq bid x 4 doses MD to follow   Hansel Feinstein CNM, MSN Incision with erythema and induration on the right with purulent drainage, suture clipped and 2.5 cm opening created and wound probed to fascia, packed with 4x4 with saline Assessment: s/p CS with wound infection  post-op fever Possible intraabdominal infection as well as wound infection, CT result and images reviewed Plan: Encourage ambulation Continue ABX therapy due to Post-op infection  Wound care 7/24, 25 Incision: separation 2 cm wide x 2 cm deep pyramidal. Massage: no deep defect. DVT Evaluation: No evidence of DVT seen on physical exam.  Recent Labs (last 2 labs)       Recent Labs    08/10/19 0524 08/12/19 0509  HGB 10.3* 9.7*  HCT 34.9* 33.2*     CBC Latest Ref Rng & Units 08/12/2019 08/10/2019 08/05/2019  WBC 4.0 - 10.5 K/uL 11.4(H) 22.4(H) 14.7(H)  Hemoglobin 12.0 - 15.0 g/dL 9.7(L) 10.3(L) 9.8(L)  Hematocrit 36 - 46 % 33.2(L) 34.9(L) 32.2(L)  Platelets 150 - 400 K/uL 482(H) 467(H) 278    Assessment/Plan: Status post Cesarean section. Postoperative course complicated by wound infection on zosyn,  and Vanc.  d/c home on PO meds.. Wound care at home by partner.   Consults: wound care team  Significant Diagnostic Studies: radiology: CT scan:    Treatments: IV hydration, antibiotics: Zosyn and vancomycin and therapies: wound care  Discharge Exam: Blood pressure (!) 142/82, pulse 80, temperature 98.5 F (36.9 C), temperature source Oral, resp. rate 18, height 5\' 4"  (1.626 m), weight 114.8 kg, SpO2 98 %, unknown if currently breastfeeding. General appearance: alert, cooperative and no distress GI: soft, non-tender; bowel sounds normal; no masses,  no organomegaly Incision/Wound: dressing dry, packing has some purulent discharge  Disposition: Discharge disposition: 01-Home or Self Care       Discharge Instructions    Discharge patient   Complete by:  As directed    Discharge disposition: 01-Home or Self Care   Discharge patient date: 08/13/2019     Allergies as of 08/13/2019      Reactions   Cold & Cough Daytime [acetaminophen-dm] Shortness Of Breath   Could not tolerate- orange liquid, may have contained Pseudoephedrine- "I could not tolerate"   Sulfa Antibiotics Swelling   Zofran [ondansetron] Other (See Comments)   Muscle Spasms   Abilify [aripiprazole] Other (See Comments)   Lethargy, unable to function   Amoxicillin-pot Clavulanate Other (See Comments)   Stomach pains Has patient had a PCN reaction causing immediate rash, facial/tongue/throat swelling, SOB or lightheadedness with hypotension: No Has patient had a PCN reaction causing severe rash involving mucus membranes or skin necrosis: No Has patient had a PCN reaction that required hospitalization No Has patient had a PCN reaction occurring within the last 10 years: Yes If all of the above answers are "NO", then may proceed with Cephalosporin use.   Cephalexin Other (See Comments)   Stomach pains Pt taking Cefadroxil as outpatient   Latex Swelling, Other (See Comments)   Burning of skin, too   Phenergan [promethazine]    'gave me twitches' 'not able to sit still'   Tape Other (See Comments)   Swelling, burning of skin .    Zicam Cold Remedy [homeopathic Products] Nausea And Vomiting  Keflex [cephalexin] Rash, Other (See Comments)   Rash--able to take w/ Benadryl   Vicodin [hydrocodone-acetaminophen] Hives, Rash   Per pt- makes her very sleepy, feels like she isn't getting her breath. States she does not have throat/tongue swelling or anaphylaxis.      Medication List    STOP taking these medications   ferrous sulfate 325 (65 FE) MG tablet   FLUoxetine 20 MG capsule Commonly known as: PROzac   levothyroxine 150 MCG tablet Commonly known as: Synthroid   oxyCODONE 5 MG immediate release tablet Commonly known as: Oxy IR/ROXICODONE   Prenatal Vitamin Plus  Low Iron 27-1 MG Tabs   promethazine 25 MG tablet Commonly known as: PHENERGAN     TAKE these medications   clindamycin 300 MG capsule Commonly known as: CLEOCIN Take 1 capsule (300 mg total) by mouth 3 (three) times daily.   ibuprofen 800 MG tablet Commonly known as: ADVIL Take 1 tablet (800 mg total) by mouth 3 (three) times daily.   NIFEdipine 30 MG 24 hr tablet Commonly known as: ADALAT CC Take 1 tablet (30 mg total) by mouth daily. What changed: additional instructions   oxyCODONE-acetaminophen 5-325 MG tablet Commonly known as: PERCOCET/ROXICET Take 1-2 tablets by mouth every 4 (four) hours as needed for moderate pain.       Follow-up [facility] Follow up in 3 day(s).   Specialty: Obstetrics and Gynecology Contact information: [place] [id number] [phone]              Signed: Emeterio Reeve 08/13/2019, 7:41 AM

## 2019-08-13 NOTE — Discharge Instructions (Signed)
Wound Infection Instructions: Cleanse right side C section incision with sterile normal saline and pat gentle dry. Pack open segment with Normal Saline moist kerlex. Cover with dry gauze and secure with ABD pad and paper tape. A wound infection happens when germs start to grow in a wound. Germs that cause wound infections are most often bacteria. Other types of infections can occur as well. An infection can cause the wound to break open. Wound infections need treatment. If a wound infection is not treated, problems can happen. What are the causes?  Most often caused by germs (bacteria) that grow in a wound.  Other germs, such as yeast and funguses, can also cause wound infections. What increases the risk?  Having a weak body defense system (immune system).  Having diabetes.  Taking certain medicines (steroids) for a long time.  Smoking.  Being an older person.  Being overweight.  Taking certain medicines for cancer treatment. What are the signs or symptoms?  Having more redness, swelling, or pain at the wound site.  Having more blood or fluid at the wound site.  A bad smell coming from a wound or bandage (dressing).  Having a fever.  Feeling very tired.  Having warmth at or around the wound.  Having pus at the wound site. How is this treated?  This condition is most often treated with an antibiotic medicine. ? The infection should improve 24-48 hours after you start antibiotics. ? After 24-48 hours, redness around the wound should stop spreading. The wound should also be less painful. Follow these instructions at home: Medicines  Take or apply over-the-counter and prescription medicines only as told by your doctor.  If you were prescribed an antibiotic medicine, take or apply it as told by your doctor. Do not stop using the antibiotic even if you start to feel better. Wound care   Clean the wound each day, or as told by your doctor. ? Wash the wound with mild  soap and water. ? Rinse the wound with water to remove all soap. ? Pat the wound dry with a clean towel. Do not rub it.  Follow instructions from your doctor about how to take care of your wound. Make sure you: ? Wash your hands with soap and water before and after you change your bandage. If you cannot use soap and water, use hand sanitizer. ? Change your bandage as told by your doctor. ? Leave stitches (sutures), skin glue, or skin tape (adhesive) strips in place if your wound has been closed. They may need to stay in place for 2 weeks or longer. If tape strips get loose and curl up, you may trim the loose edges. Do not remove tape strips completely unless your doctor says it is okay. Some wounds are left open to heal on their own.  Check your wound every day for signs of infection. Watch for: ? More redness, swelling, or pain. ? More fluid or blood. ? Warmth. ? Pus or a bad smell. General instructions  Keep the bandage dry until your doctor says it can be removed.  Do not take baths, swim, or use a hot tub until your doctor approves. Ask your doctor if you may take showers. You may only be allowed to take sponge baths.  Raise (elevate) the injured area above the level of your heart while you are sitting or lying down.  Do not scratch or pick at the wound.  Keep all follow-up visits as told by your doctor. This is  important. Contact a doctor if:  Medicine does not help your pain.  You have more redness, swelling, or pain around your wound.  You have more fluid or blood coming from your wound.  Your wound feels warm to the touch.  You have pus coming from your wound.  You notice a bad smell coming from your wound or your bandage.  Your wound that was closed breaks open. Get help right away if:  You have a red streak going away from your wound.  You have a fever. Summary  A wound infection happens when germs start to grow in a wound.  This condition is usually treated  with an antibiotic medicine.  Follow instructions from your doctor about how to take care of your wound.  Contact a doctor if your wound infection does not start to get better in 24-48 hours, or your symptoms get worse.  Keep all follow-up visits as told by your doctor. This is important. This information is not intended to replace advice given to you by your health care provider. Make sure you discuss any questions you have with your health care provider. Document Revised: 08/17/2017 Document Reviewed: 08/17/2017 Elsevier Patient Education  [place].   Wound Infection A wound infection happens when tiny organisms (microorganisms) start to grow in a wound. A wound infection is most often caused by bacteria. Infection can cause the wound to break open or worsen. Wound infection needs treatment. If a wound infection is left untreated, complications can occur. Untreated wound infections may lead to an infection in the bloodstream (septicemia) or a bone infection (osteomyelitis). What are the causes? This condition is most often caused by bacteria growing in a wound. Other microorganisms, like yeast and fungi, can also cause wound infections. What increases the risk? The following factors may make you more likely to develop this condition:  Having a weak body defense system (immune system).  Having diabetes.  Taking steroid medicines for a long time (chronic use).  Smoking.  Being an older person.  Being overweight.  Taking chemotherapy medicines. What are the signs or symptoms? Symptoms of this condition include:  Having more redness, swelling, or pain at the wound site.  Having more blood or fluid at the wound site.  A bad smell coming from a wound or bandage (dressing).  Having a fever.  Feeling tired or fatigued.  Having warmth at or around the wound.  Having pus at the wound site. How is this diagnosed? This condition is diagnosed with a medical history and  physical exam. You may also have a wound culture or blood tests or both. How is this treated? This condition is usually treated with an antibiotic medicine.  The infection should improve 24-48 hours after you start antibiotics.  After 24-48 hours, redness around the wound should stop spreading, and the wound should be less painful. Follow these instructions at home: Medicines  Take or apply over-the-counter and prescription medicines only as told by your health care provider.  If you were prescribed an antibiotic medicine, take or apply it as told by your health care provider. Do not stop using the antibiotic even if you start to feel better. Wound care   Clean the wound each day, or as told by your health care provider. ? Wash the wound with mild soap and water. ? Rinse the wound with water to remove all soap. ? Pat the wound dry with a clean towel. Do not rub it.  Follow instructions from your  health care provider about how to take care of your wound. Make sure you: ? Wash your hands with soap and water before and after you change your dressing. If soap and water are not available, use hand sanitizer. ? Change your dressing as told by your health care provider. ? Leave stitches (sutures), skin glue, or adhesive strips in place if your wound has been closed. These skin closures may need to stay in place for 2 weeks or longer. If adhesive strip edges start to loosen and curl up, you may trim the loose edges. Do not remove adhesive strips completely unless your health care provider tells you to do that. Some wounds are left open to heal on their own.  Check your wound every day for signs of infection. Watch for: ? More redness, swelling, or pain. ? More fluid or blood. ? Warmth. ? Pus or a bad smell. General instructions  Keep the dressing dry until your health care provider says it can be removed.  Do not take baths, swim, or use a hot tub until your health care provider approves.  Ask your health care provider if you may take showers. You may only be allowed to take sponge baths.  Raise (elevate) the injured area above the level of your heart while you are sitting or lying down.  Do not scratch or pick at the wound.  Keep all follow-up visits as told by your health care provider. This is important. Contact a health care provider if:  Your pain is not controlled with medicine.  You have more redness, swelling, or pain around your wound.  You have more fluid or blood coming from your wound.  Your wound feels warm to the touch.  You have pus coming from your wound.  You continue to notice a bad smell coming from your wound or your dressing.  Your wound that was closed breaks open. Get help right away if:  You have a red streak going away from your wound.  You have a fever. Summary  A wound infection happens when tiny organisms (microorganisms) start to grow in a wound.  This condition is usually treated with an antibiotic medicine.  Follow instructions from your health care provider about how to take care of your wound.  Contact a health care provider if your wound infection does not begin to improve in 24-48 hours, or your symptoms worsen.  Keep all follow-up visits as told by your health care provider. This is important. This information is not intended to replace advice given to you by your health care provider. Make sure you discuss any questions you have with your health care provider. Document Revised: 08/17/2017 Document Reviewed: 08/17/2017 Elsevier Patient Education  [place].

## 2019-08-13 NOTE — Progress Notes (Signed)
Reviewed discharge instructions with patient and significant other. Significant other was able to teach back abdominal dressing change. Both patient and significant other voiced understanding of signs/symptoms of infection and when to call MD/go to [facility]U. Patient follow up appointment scheduled for 3 days. Supplies were sent home with patient to cover daily dressing changes until follow up appointment. Patient and significant other asked appropriate questions.

## 2019-08-16 ENCOUNTER — Ambulatory Visit (INDEPENDENT_AMBULATORY_CARE_PROVIDER_SITE_OTHER): Payer: Medicaid Other

## 2019-08-16 ENCOUNTER — Other Ambulatory Visit: Payer: Self-pay

## 2019-08-16 ENCOUNTER — Ambulatory Visit (INDEPENDENT_AMBULATORY_CARE_PROVIDER_SITE_OTHER): Payer: Medicaid Other | Admitting: Clinical

## 2019-08-16 VITALS — BP 106/80 | HR 97 | Wt 235.0 lb

## 2019-08-16 DIAGNOSIS — Z8659 Personal history of other mental and behavioral disorders: Secondary | ICD-10-CM

## 2019-08-16 DIAGNOSIS — Z5189 Encounter for other specified aftercare: Secondary | ICD-10-CM

## 2019-08-16 DIAGNOSIS — Z0130 Encounter for examination of blood pressure without abnormal findings: Secondary | ICD-10-CM

## 2019-08-17 NOTE — Progress Notes (Signed)
Pt here 08/16/19 for blood pressure and incision check s/p c-section on 08/02/19. Pt was admitted for c-section wound infection 7/22-7/25. Given instructions on DC to change wet to dry dressing once daily.   Dressing removed. Left side of incision is clean, dry, with fully approximated edges. Open area to right side of incision is draining yellow drainage, would bed is pink, granulation tissue. Pt denies any pain or fevers. BP today is 106/80, HR 97. Pt continues to take Nifedipine 30 mg daily.   Fair, MD to bedside to assess wound. Reviewed BP and med list with MD. Provider states pt may continue wet to dry dressing daily and follow up on 08/21/19. States pt should continue BP med and oral antibiotic at this time. Pt given supplies for dressing changes; reviewed good wound care and s/s of infection.  Pt reports restarting Prozac; Edinburgh score is negative today.   Apolonio Schneiders RN 08/16/19

## 2019-08-17 NOTE — Progress Notes (Signed)
Patient was assessed and managed by nursing staff during this encounter. I have reviewed the chart and agree with the documentation and plan. I have also made any necessary editorial changes.  Vitals stable, no erythema surrounding incision site. Clear serous drainage present from right side of incision that had been opened up previously. Appears to be healing well. Cont current plan. Patient to return Monday for another dressing change and to continue dressing changes daily at home.  Chauncey Mann, MD 08/17/2019 9:31 AM

## 2019-08-17 NOTE — BH Specialist Note (Signed)
Integrated Behavioral Health via Telemedicine Phone Visit  08/17/2019 Kimberly [name] [id number]  Number of [place] visits: 2 Session Start time: 1:17  Session End time: 1:29 Total time: 12  Referring Provider: Arlina Robes, MD Type of Visit: Video Patient/Family location: Home [facility] Provider location: [facility] [place] [facility]  All persons participating in visit: [name] and [place]    Confirmed patient's address: Yes  Confirmed patient's phone number: Yes  Any changes to demographics: No   Confirmed patient's insurance: Yes  Any changes to patient's insurance: No   Discussed confidentiality: at previous visi  I connected with Kimberly [name]  by a video enabled telemedicine application ([place]) and verified that I am speaking with the correct person using two identifiers.     I discussed the limitations of evaluation and management by telemedicine and the availability of in person appointments.  I discussed that the purpose of this visit is to provide behavioral health care while limiting exposure to the novel coronavirus.   Discussed there is a possibility of technology failure and discussed alternative modes of communication if that failure occurs.  I discussed that engaging in this virtual visit, they consent to the provision of behavioral healthcare and the services will be billed under their insurance.  Patient and/or legal guardian expressed understanding and consented to virtual visit: Yes   PRESENTING CONCERNS: Patient and/or family reports the following symptoms/concerns: Pt states her primary concern today is colicky baby, is taking Prozac as prescribed by PCP, and plans to follow up with PCP soon.  Duration of problem: Ongoing; Severity of problem: mild  STRENGTHS (Protective Factors/Coping Skills): Adhering to treatment via medication  GOALS ADDRESSED: Patient  will: 1.  Maintain reduction of symptoms of: anxiety and depression  2.  Demonstrate ability to: Increase healthy adjustment to current life circumstances  INTERVENTIONS: Interventions utilized:  Supportive Counseling and Medication Monitoring Standardized Assessments completed: GAD-7 and PHQ 9  ASSESSMENT: Patient currently experiencing History of depression.   Patient may benefit from continued psychoeducation and brief therapeutic interventions regarding maintaining reduction of depressive and anxious symptoms .  PLAN: 1. Follow up with behavioral health clinician on : As needed 2. Behavioral recommendations:  -Continue taking Prozac as prescribed  3. Referral(s): [place] (In Clinic)  I discussed the assessment and treatment plan with the patient and/or parent/guardian. They were provided an opportunity to ask questions and all were answered. They agreed with the plan and demonstrated an understanding of the instructions.   They were advised to call back or seek an in-person evaluation if the symptoms worsen or if the condition fails to improve as anticipated.  Caroleen Hamman [facility]  Depression screen [facility] 2/9 08/30/2019 08/23/2019 08/21/2019 07/31/2019 06/07/2019  Decreased Interest 0 1 1 1  0  Down, Depressed, Hopeless 0 0 0 1 0  PHQ - 2 Score 0 1 1 2  0  Altered sleeping 0 1 1 2 1   Tired, decreased energy 1 1 1 1 1   Change in appetite 0 1 1 0 1  Feeling bad or failure about yourself  0 0 0 0 0  Trouble concentrating 0 0 0 0 0  Moving slowly or fidgety/restless 0 0 0 0 0  Suicidal thoughts 0 0 0 0 0  PHQ-9 Score 1 4 4 5 3   Difficult doing work/chores - - - - -  Some recent data might be hidden   GAD 7 :  Generalized Anxiety Score 08/23/2019 08/21/2019 07/31/2019 06/07/2019  Nervous, Anxious, on Edge 1 1 1  0  Control/stop worrying 0 0 1 1  Worry too much - different things 0 1 1 0  Trouble relaxing 1 1 1 1   Restless 0 0 0 0  Easily annoyed or irritable 0 0 1  1  Afraid - awful might happen 0 0 0 0  Total GAD 7 Score 2 3 5 3   Anxiety Difficulty - - - -

## 2019-08-19 ENCOUNTER — Inpatient Hospital Stay (HOSPITAL_COMMUNITY)
Admission: AD | Admit: 2019-08-19 | Discharge: 2019-08-19 | Disposition: A | Discharge status: Home / Self Care | Payer: Medicaid Other | Source: Ambulatory Visit | Attending: Obstetrics and Gynecology | Admitting: Obstetrics and Gynecology

## 2019-08-19 ENCOUNTER — Encounter (HOSPITAL_COMMUNITY): Payer: Self-pay | Admitting: Obstetrics and Gynecology

## 2019-08-19 ENCOUNTER — Other Ambulatory Visit: Payer: Self-pay

## 2019-08-19 DIAGNOSIS — Z791 Long term (current) use of non-steroidal anti-inflammatories (NSAID): Secondary | ICD-10-CM | POA: Insufficient documentation

## 2019-08-19 DIAGNOSIS — O8600 Infection of obstetric surgical wound, unspecified: Secondary | ICD-10-CM | POA: Diagnosis not present

## 2019-08-19 DIAGNOSIS — Z792 Long term (current) use of antibiotics: Secondary | ICD-10-CM | POA: Diagnosis not present

## 2019-08-19 DIAGNOSIS — R509 Fever, unspecified: Secondary | ICD-10-CM | POA: Diagnosis present

## 2019-08-19 DIAGNOSIS — Z98891 History of uterine scar from previous surgery: Secondary | ICD-10-CM

## 2019-08-19 DIAGNOSIS — Z79899 Other long term (current) drug therapy: Secondary | ICD-10-CM | POA: Diagnosis not present

## 2019-08-19 NOTE — MAU Note (Signed)
Dr. Darene Lamer and Dr. Astrid Drafts bedside for dressing change.

## 2019-08-19 NOTE — Discharge Instructions (Signed)
Wound Infection A wound infection happens when germs start to grow in a wound. Germs that cause wound infections are most often bacteria. Other types of infections can occur as well. An infection can cause the wound to break open. Wound infections need treatment. If a wound infection is not treated, problems can happen. What are the causes?  Most often caused by germs (bacteria) that grow in a wound.  Other germs, such as yeast and funguses, can also cause wound infections. What increases the risk?  Having a weak body defense system (immune system).  Having diabetes.  Taking certain medicines (steroids) for a long time.  Smoking.  Being an older person.  Being overweight.  Taking certain medicines for cancer treatment. What are the signs or symptoms?  Having more redness, swelling, or pain at the wound site.  Having more blood or fluid at the wound site.  A bad smell coming from a wound or bandage (dressing).  Having a fever.  Feeling very tired.  Having warmth at or around the wound.  Having pus at the wound site. How is this treated?  This condition is most often treated with an antibiotic medicine. ? The infection should improve 24-48 hours after you start antibiotics. ? After 24-48 hours, redness around the wound should stop spreading. The wound should also be less painful. Follow these instructions at home: Medicines  Take or apply over-the-counter and prescription medicines only as told by your doctor.  If you were prescribed an antibiotic medicine, take or apply it as told by your doctor. Do not stop using the antibiotic even if you start to feel better. Wound care   Clean the wound each day, or as told by your doctor. ? Wash the wound with mild soap and water. ? Rinse the wound with water to remove all soap. ? Pat the wound dry with a clean towel. Do not rub it.  Follow instructions from your doctor about how to take care of your wound. Make sure  you: ? Wash your hands with soap and water before and after you change your bandage. If you cannot use soap and water, use hand sanitizer. ? Change your bandage as told by your doctor. ? Leave stitches (sutures), skin glue, or skin tape (adhesive) strips in place if your wound has been closed. They may need to stay in place for 2 weeks or longer. If tape strips get loose and curl up, you may trim the loose edges. Do not remove tape strips completely unless your doctor says it is okay. Some wounds are left open to heal on their own.  Check your wound every day for signs of infection. Watch for: ? More redness, swelling, or pain. ? More fluid or blood. ? Warmth. ? Pus or a bad smell. General instructions  Keep the bandage dry until your doctor says it can be removed.  Do not take baths, swim, or use a hot tub until your doctor approves. Ask your doctor if you may take showers. You may only be allowed to take sponge baths.  Raise (elevate) the injured area above the level of your heart while you are sitting or lying down.  Do not scratch or pick at the wound.  Keep all follow-up visits as told by your doctor. This is important. Contact a doctor if:  Medicine does not help your pain.  You have more redness, swelling, or pain around your wound.  You have more fluid or blood coming from your wound.    Your wound feels warm to the touch.  You have pus coming from your wound.  You notice a bad smell coming from your wound or your bandage.  Your wound that was closed breaks open. Get help right away if:  You have a red streak going away from your wound.  You have a fever. Summary  A wound infection happens when germs start to grow in a wound.  This condition is usually treated with an antibiotic medicine.  Follow instructions from your doctor about how to take care of your wound.  Contact a doctor if your wound infection does not start to get better in 24-48 hours, or your  symptoms get worse.  Keep all follow-up visits as told by your doctor. This is important. This information is not intended to replace advice given to you by your health care provider. Make sure you discuss any questions you have with your health care provider. Document Revised: 08/17/2017 Document Reviewed: 08/17/2017 Elsevier Patient Education  2020 Elsevier Inc.  

## 2019-08-19 NOTE — MAU Note (Signed)
Kimberly [name] is a 29 y.o. here in MAU reporting: has been seen for infected c/s, has been taking abx as prescribed. Changed her dressings today and everything looked good but took her temperature at home and it was 100.4. states fever has resolved but was told to come in.  Onset of complaint: ongoing  Pain score: 0/10  Vitals:   08/19/19 1809  BP: 113/69  Pulse: (!) 118  Resp: 16  Temp: 99.1 F (37.3 C)  SpO2: 99%     Lab orders placed from triage: none

## 2019-08-19 NOTE — MAU Provider Note (Signed)
History     CSN: [id number]  Arrival date and time: 08/19/19 1752   First Provider Initiated Contact with Patient 08/19/19 1820    Chief Complaint  Patient presents with  . Incision check  . Fever   HPI  Kimberly [name] is a 29 year old G3P2012 who presents with concern of fever at home on POD#17 s/p CS on 08/02/19 for non-reassuring FHR in setting of IOL for A1GDM, LGA and cHTN. Pt re-hospitalized on 7/25-7/22 for IV antibiotics given concern of fever and purulent drainage visible along lateral right aspect of CS incision. Pt presents to triage today given concern of low-grade fever at home to 100.43F. Last dose of ibuprofen at 1600 with subsequent resolution of fever. Denies pain, redness and purulent drainage from the wound. Pt's boyfriend has been changing wet to dry dressings daily with good adherence. Pt has appointment with Wound Clinic on 8/2.  Past Medical History:  Diagnosis Date  . Acanthosis nigricans, acquired 03/01/2011  . Anxiety   . Asthma    "grew out of it" (11/16/2012)  . Chronic back pain   . Complication of anesthesia    "takes a lot to put me to sleep; woke up completely mid endoscopy" (11/16/2012)  . Depression   . Dysrhythmia    ST (11/16/2012)  . Gastric reflux   . 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 Iron deficiency anemia   . Migraines    "weekly" (11/16/2012)  . Oligomenorrhea 03/01/2011  . Ovarian cyst   . Pregnancy induced hypertension   . Schizophrenia ([place])    "moderate" (11/16/2012)  . Scoliosis     Past Surgical History:  Procedure Laterality Date  . CESAREAN SECTION N/A 08/02/2019   Procedure: CESAREAN SECTION;  Surgeon: Gwynne Edinger, MD;  Location: [facility]S;  Service: Obstetrics;  Laterality: N/A;  . LAPAROSCOPIC OVARIAN CYSTECTOMY Left 02/11/2016   Procedure: LAPAROSCOPIC OVARIAN CYSTECTOMY;  Surgeon: Emily Filbert, MD;  Location: [facility] ORS;  Service: Gynecology;  Laterality: Left;  . UPPER GI ENDOSCOPY  ~ 2006; ~2008     Family History  Problem Relation Age of Onset  . Diabetes Mother   . Hypertension Mother   . Vision loss Mother   . Hypertension Father   . Hyperlipidemia Father   . Thyroid disease Maternal Grandmother     Social History   Tobacco Use  . Smoking status: Never Smoker  . Smokeless tobacco: Never Used  Vaping Use  . Vaping Use: Never used  Substance Use Topics  . Alcohol use: Not Currently    Comment: very rare  . Drug use: No    Allergies:  Allergies  Allergen Reactions  . Cold & Cough Daytime [Acetaminophen-Dm] Shortness Of Breath    Could not tolerate- orange liquid, may have contained Pseudoephedrine- "I could not tolerate"  . Sulfa Antibiotics Swelling  . Zofran [Ondansetron] Other (See Comments)    Muscle Spasms  . Abilify [Aripiprazole] Other (See Comments)    Lethargy, unable to function  . Amoxicillin-Pot Clavulanate Other (See Comments)    Stomach pains Has patient had a PCN reaction causing immediate rash, facial/tongue/throat swelling, SOB or lightheadedness with hypotension: No Has patient had a PCN reaction causing severe rash involving mucus membranes or skin necrosis: No Has patient had a PCN reaction that required hospitalization No Has patient had a PCN reaction occurring within the last 10 years: Yes If all of the above answers are "NO", then may proceed with Cephalosporin use.   Marland Kitchen  Cephalexin Other (See Comments)    Stomach pains Pt taking Cefadroxil as outpatient  . Latex Swelling and Other (See Comments)    Burning of skin, too  . Phenergan [Promethazine]     'gave me twitches' 'not able to sit still'  . Tape Other (See Comments)    Swelling, burning of skin .   Marland Kitchen Zicam Cold Remedy [Homeopathic Products] Nausea And Vomiting  . Keflex [Cephalexin] Rash and Other (See Comments)    Rash--able to take w/ Benadryl  . Vicodin [Hydrocodone-Acetaminophen] Hives and Rash    Per pt- makes her very sleepy, feels like she isn't getting her breath.  States she does not have throat/tongue swelling or anaphylaxis.    Medications Prior to Admission  Medication Sig Dispense Refill Last Dose  . ciprofloxacin (CIPRO) 500 MG tablet Take 1 tablet (500 mg total) by mouth 2 (two) times daily. 14 tablet 0   . FLUoxetine (PROZAC) 20 MG capsule Take 20 mg by mouth daily.     Marland Kitchen ibuprofen (ADVIL) 800 MG tablet Take 1 tablet (800 mg total) by mouth 3 (three) times daily. 30 tablet 0   . NIFEdipine (ADALAT CC) 30 MG 24 hr tablet Take 1 tablet (30 mg total) by mouth daily. 30 tablet 1   . oxyCODONE-acetaminophen (PERCOCET/ROXICET) 5-325 MG tablet Take 1-2 tablets by mouth every 4 (four) hours as needed for moderate pain. (Patient not taking: Reported on 08/16/2019) 15 tablet 0     Review of Systems  Constitutional: Positive for fever. Negative for activity change and chills.  Respiratory: Negative for chest tightness and shortness of breath.   Cardiovascular: Negative for chest pain and leg swelling.  Gastrointestinal: Negative for abdominal distention, abdominal pain, diarrhea and nausea.  Genitourinary: Negative for pelvic pain, vaginal bleeding, vaginal discharge and vaginal pain.  Musculoskeletal: Positive for myalgias.  Neurological: Negative for headaches.  Psychiatric/Behavioral: Positive for sleep disturbance.   Physical Exam   Blood pressure 113/69, pulse (!) 118, temperature 99.1 F (37.3 C), temperature source Oral, resp. rate 16, SpO2 99 %, not currently breastfeeding.  Physical Exam Constitutional:      General: She is not in acute distress.    Appearance: Normal appearance. She is normal weight.  HENT:     Head: Normocephalic and atraumatic.     Nose: Nose normal.     Mouth/Throat:     Mouth: Mucous membranes are moist.     Pharynx: Oropharynx is clear.  Eyes:     Extraocular Movements: Extraocular movements intact.  Pulmonary:     Effort: Pulmonary effort is normal.     Breath sounds: Normal breath sounds.  Abdominal:      General: There is no distension.     Palpations: Abdomen is soft. There is no mass.     Tenderness: There is no abdominal tenderness. There is no guarding or rebound.     Comments: See photo in media. Low transverse CS incision with 1" of healthy granulation tissue visible along lateral right aspect of incision with minimal serious fluid visible. No surrounding erythema or tenderness.  Musculoskeletal:        General: Normal range of motion.     Cervical back: Normal range of motion and neck supple.  Skin:    General: Skin is warm and dry.  Neurological:     General: No focal deficit present.     Mental Status: She is alert and oriented to person, place, and time.  Psychiatric:  Mood and Affect: Mood normal.        Behavior: Behavior normal.        MAU Course   MDM 1830: Called Dr. Elly Modena to discuss exam. Confirmed plan to discharge home with close follow-up.  Assessment and Plan   Kimberly [name] is a 29 year old G3P2012 who presents with concern of low grade fever on POD#17 s/p CS on 08/02/19 for non-reassuring FHR in setting of IOL for A1GDM, LGA and cHTN. Pt initially re-hospitalized on POD#8 for IV antibiotics and discharged home on 7/22. Today no concern of worsening pain and reassuringly low-grade fever responded to tylenol. On exam, healthy granulation tissue with minimal serous fluid visible. No surrounding erythema. Will plan to discharge home s/p conversation with OB Attending, Dr. Elly Modena.  Plan: -provided wound care in MAU prior to discharge; pt will continue wet to dry dressings daily -instructed pt to complete course of cipro (3 additional pills) as previously prescribed -plan for Wound Care Clinic appt on 8/2 as previously scheduled -provided strict return precautions for fever not responding to antipyretics, worsening pain or new erythema  Randa Ngo 08/19/2019, 6:42 PM

## 2019-08-20 DIAGNOSIS — Z419 Encounter for procedure for purposes other than remedying health state, unspecified: Secondary | ICD-10-CM | POA: Diagnosis not present

## 2019-08-21 ENCOUNTER — Encounter: Payer: Self-pay | Admitting: Obstetrics and Gynecology

## 2019-08-21 ENCOUNTER — Ambulatory Visit (INDEPENDENT_AMBULATORY_CARE_PROVIDER_SITE_OTHER): Payer: Medicaid Other | Admitting: Obstetrics and Gynecology

## 2019-08-21 ENCOUNTER — Other Ambulatory Visit: Payer: Self-pay

## 2019-08-21 VITALS — BP 113/73 | HR 144 | Temp 101.0°F | Ht 64.0 in | Wt 226.1 lb

## 2019-08-21 DIAGNOSIS — O8600 Infection of obstetric surgical wound, unspecified: Secondary | ICD-10-CM

## 2019-08-21 MED ORDER — CIPROFLOXACIN HCL 500 MG PO TABS: 500.0000 mg | ORAL_TABLET | Freq: Two times a day (BID) | ORAL | 0 refills | Status: DC

## 2019-08-21 NOTE — Progress Notes (Signed)
Patient ID: Kimberly [name], female   DOB: 1991/01/01, 29 y.o.   MRN: [id number] [name] presents for nurse visit for wound check. She reports having some fevers at home over the weekend. Was seen in MAU.  Completed antibiotics. She denies any bowel or bladder dysfunction. Husband has been doing daily dressing changes  PE VS as recorded  Lungs clear  Heart RRR Abd soft + BS non tender, 2 cm x 1 cm healing wound right conner of incision, probed with sterile Q tip, 2 cm pocket below granulation tissue, purulent material expressed, repacked with wet to dry sterile gauze. Pt instructed on wound care.  A/P Wound check        HTN  Will continue with wound care. Will check CBC. Restart Cipro for 7 days. Nurse visit for wound check on Wednesday.

## 2019-08-21 NOTE — Progress Notes (Signed)
Here for wound check and bandage change. States went to hospital Saturday 08/19/19 because had fever at home 100.4 ; was asesses and sent home. Then states started feeling bad yesterday- feeling hot and cold, very tired, fever. States fever 101 this am.  States since she had appointment wanted to be evaluated in office.  Temperature 101.1 this am, pulse 144. Dressing removed with serous discharge noted.  Left dressing loosely over wound and had Dr.Ervin check wound. See his notes. Rainy Rothman,RN

## 2019-08-21 NOTE — Patient Instructions (Signed)
Wound Care, Adult Taking care of your wound properly can help to prevent pain, infection, and scarring. It can also help your wound to heal more quickly. How to care for your wound Wound care      Follow instructions from your health care provider about how to take care of your wound. Make sure you: ? Wash your hands with soap and water before you change the bandage (dressing). If soap and water are not available, use hand sanitizer. ? Change your dressing as told by your health care provider. ? Leave stitches (sutures), skin glue, or adhesive strips in place. These skin closures may need to stay in place for 2 weeks or longer. If adhesive strip edges start to loosen and curl up, you may trim the loose edges. Do not remove adhesive strips completely unless your health care provider tells you to do that.  Check your wound area every day for signs of infection. Check for: ? Redness, swelling, or pain. ? Fluid or blood. ? Warmth. ? Pus or a bad smell.  Ask your health care provider if you should clean the wound with mild soap and water. Doing this may include: ? Using a clean towel to pat the wound dry after cleaning it. Do not rub or scrub the wound. ? Applying a cream or ointment. Do this only as told by your health care provider. ? Covering the incision with a clean dressing.  Ask your health care provider when you can leave the wound uncovered.  Keep the dressing dry until your health care provider says it can be removed. Do not take baths, swim, use a hot tub, or do anything that would put the wound underwater until your health care provider approves. Ask your health care provider if you can take showers. You may only be allowed to take sponge baths. Medicines   If you were prescribed an antibiotic medicine, cream, or ointment, take or use the antibiotic as told by your health care provider. Do not stop taking or using the antibiotic even if your condition improves.  Take  over-the-counter and prescription medicines only as told by your health care provider. If you were prescribed pain medicine, take it 30 or more minutes before you do any wound care or as told by your health care provider. General instructions  Return to your normal activities as told by your health care provider. Ask your health care provider what activities are safe.  Do not scratch or pick at the wound.  Do not use any products that contain nicotine or tobacco, such as cigarettes and e-cigarettes. These may delay wound healing. If you need help quitting, ask your health care provider.  Keep all follow-up visits as told by your health care provider. This is important.  Eat a diet that includes protein, vitamin A, vitamin C, and other nutrient-rich foods to help the wound heal. ? Foods rich in protein include meat, dairy, beans, nuts, and other sources. ? Foods rich in vitamin A include carrots and dark green, leafy vegetables. ? Foods rich in vitamin C include citrus, tomatoes, and other fruits and vegetables. ? Nutrient-rich foods have protein, carbohydrates, fat, vitamins, or minerals. Eat a variety of healthy foods including vegetables, fruits, and whole grains. Contact a health care provider if:  You received a tetanus shot and you have swelling, severe pain, redness, or bleeding at the injection site.  Your pain is not controlled with medicine.  You have redness, swelling, or pain around the wound.    You have fluid or blood coming from the wound.  Your wound feels warm to the touch.  You have pus or a bad smell coming from the wound.  You have a fever or chills.  You are nauseous or you vomit.  You are dizzy. Get help right away if:  You have a red streak going away from your wound.  The edges of the wound open up and separate.  Your wound is bleeding, and the bleeding does not stop with gentle pressure.  You have a rash.  You faint.  You have trouble  breathing. Summary  Always wash your hands with soap and water before changing your bandage (dressing).  To help with healing, eat foods that are rich in protein, vitamin A, vitamin C, and other nutrients.  Check your wound every day for signs of infection. Contact your health care provider if you suspect that your wound is infected. This information is not intended to replace advice given to you by your health care provider. Make sure you discuss any questions you have with your health care provider. Document Revised: 04/25/2018 Document Reviewed: 07/23/2015 Elsevier Patient Education  2020 Elsevier Inc.  

## 2019-08-22 LAB — CBC WITH DIFFERENTIAL/PLATELET
Basophils Absolute: 0 10*3/uL (ref 0.0–0.2)
Basos: 0 %
EOS (ABSOLUTE): 0.1 10*3/uL (ref 0.0–0.4)
Eos: 1 %
Hematocrit: 35.2 % (ref 34.0–46.6)
Hemoglobin: 11 g/dL — ABNORMAL LOW (ref 11.1–15.9)
Immature Grans (Abs): 0 10*3/uL (ref 0.0–0.1)
Immature Granulocytes: 0 %
Lymphocytes Absolute: 1.1 10*3/uL (ref 0.7–3.1)
Lymphs: 13 %
MCH: 25.2 pg — ABNORMAL LOW (ref 26.6–33.0)
MCHC: 31.3 g/dL — ABNORMAL LOW (ref 31.5–35.7)
MCV: 81 fL (ref 79–97)
Monocytes Absolute: 0.8 10*3/uL (ref 0.1–0.9)
Monocytes: 9 %
Neutrophils Absolute: 6.9 10*3/uL (ref 1.4–7.0)
Neutrophils: 77 %
Platelets: 427 10*3/uL (ref 150–450)
RBC: 4.37 x10E6/uL (ref 3.77–5.28)
RDW: 17.6 % — ABNORMAL HIGH (ref 11.7–15.4)
WBC: 9 10*3/uL (ref 3.4–10.8)

## 2019-08-23 ENCOUNTER — Ambulatory Visit (INDEPENDENT_AMBULATORY_CARE_PROVIDER_SITE_OTHER): Payer: Medicaid Other | Admitting: General Practice

## 2019-08-23 ENCOUNTER — Other Ambulatory Visit: Payer: Self-pay

## 2019-08-23 ENCOUNTER — Telehealth: Payer: Self-pay | Admitting: General Practice

## 2019-08-23 VITALS — BP 114/92 | HR 74 | Temp 98.6°F

## 2019-08-23 DIAGNOSIS — Z5189 Encounter for other specified aftercare: Secondary | ICD-10-CM

## 2019-08-23 DIAGNOSIS — I10 Essential (primary) hypertension: Secondary | ICD-10-CM

## 2019-08-23 MED ORDER — ENALAPRIL MALEATE 5 MG PO TABS: 5.0000 mg | ORAL_TABLET | Freq: Every day | ORAL | 0 refills | Status: DC

## 2019-08-23 NOTE — Progress Notes (Signed)
Agree with A & P. 

## 2019-08-23 NOTE — Progress Notes (Signed)
Patient presents to office today for follow up wound assessment. Patient states her fever finally went away yesterday but she did start bleeding like a normal period. She also notes she forgot to take her blood pressure medication this morning. BP 114/92. Incision dressing removed & Dr Rip Harbour assessed/repacked wound. Patient will follow up in 1 week for repeat wound check. Supplies for packing were also given to patient.   Koren Bound RN BSN 08/23/19

## 2019-08-23 NOTE — Telephone Encounter (Signed)
Patient called into front office regarding her mychart message. Patient states she is seeing a lot of blood in the toilet and when she wipes but none on her pad otherwise really. Discussed with patient the first period after delivery can be a little heavier. Advised patient to let us know if bleeding becomes so heavy she is saturating a pad in less than a hour for a couple hours. Patient verbalized understanding and states she hasn't felt well since starting on the procardia. She states an hour- two after taking the medication her face gets really hot and flushed red and stays that way for several hours after. She states the same thing happened this afternoon and she actually vomited this time. Patient thinks she may need alternative medication. Enalapril 5mg  sent to pharmacy per Dr Harolyn Rutherford & patient informed. Patient verbalized understanding.

## 2019-08-25 ENCOUNTER — Telehealth: Payer: Self-pay | Admitting: Family Medicine

## 2019-08-25 NOTE — Telephone Encounter (Signed)
Patient called into the office stating that she needs to speak to someone about her wound check appointment being moved up because based on the way that it is looking she believes she needs to be seen sooner. Patient instructed that a nurse will be calling her back as soon as she can. Patient verbalized understanding.

## 2019-08-28 ENCOUNTER — Other Ambulatory Visit: Payer: Self-pay

## 2019-08-28 ENCOUNTER — Encounter: Payer: Self-pay | Admitting: *Deleted

## 2019-08-28 ENCOUNTER — Ambulatory Visit: Payer: Medicaid Other

## 2019-08-30 ENCOUNTER — Other Ambulatory Visit: Payer: Self-pay

## 2019-08-30 ENCOUNTER — Ambulatory Visit: Payer: Medicaid Other

## 2019-08-30 ENCOUNTER — Ambulatory Visit (INDEPENDENT_AMBULATORY_CARE_PROVIDER_SITE_OTHER): Payer: Medicaid Other | Admitting: Clinical

## 2019-08-30 DIAGNOSIS — Z8659 Personal history of other mental and behavioral disorders: Secondary | ICD-10-CM

## 2019-08-31 ENCOUNTER — Other Ambulatory Visit: Payer: Self-pay | Admitting: *Deleted

## 2019-08-31 DIAGNOSIS — O24429 Gestational diabetes mellitus in childbirth, unspecified control: Secondary | ICD-10-CM

## 2019-09-01 ENCOUNTER — Other Ambulatory Visit: Payer: Self-pay

## 2019-09-01 ENCOUNTER — Encounter: Payer: Self-pay | Admitting: Family Medicine

## 2019-09-01 ENCOUNTER — Telehealth (INDEPENDENT_AMBULATORY_CARE_PROVIDER_SITE_OTHER): Payer: Medicaid Other | Admitting: Family Medicine

## 2019-09-01 DIAGNOSIS — E071 Dyshormogenetic goiter: Secondary | ICD-10-CM

## 2019-09-01 DIAGNOSIS — I10 Essential (primary) hypertension: Secondary | ICD-10-CM

## 2019-09-01 DIAGNOSIS — F329 Major depressive disorder, single episode, unspecified: Secondary | ICD-10-CM

## 2019-09-01 DIAGNOSIS — F32A Depression, unspecified: Secondary | ICD-10-CM

## 2019-09-01 DIAGNOSIS — F419 Anxiety disorder, unspecified: Secondary | ICD-10-CM

## 2019-09-01 DIAGNOSIS — O8600 Infection of obstetric surgical wound, unspecified: Secondary | ICD-10-CM

## 2019-09-01 DIAGNOSIS — O24410 Gestational diabetes mellitus in pregnancy, diet controlled: Secondary | ICD-10-CM

## 2019-09-01 DIAGNOSIS — Z6841 Body Mass Index (BMI) 40.0 and over, adult: Secondary | ICD-10-CM

## 2019-09-01 NOTE — Progress Notes (Signed)
Patient called in regards to virtual MyChart appointment at [phone]. Phone call went straight to voicemail and voice message was left for patient. Patient was notified that she will be contacted again in 15 minutes.   Patient contacted for second attempt. Patient did not answer so voice message left for patient that she would now have to reschedule appointment.   Orlin Hilding, BSN, Therapist, sports

## 2019-09-01 NOTE — Progress Notes (Signed)
Patient did not answer for myChart PP appt today. Recommend patient schedule an in person visit as she is very high risk, has a c-section infection with fevers reported, and needs a 2hr GTT.   Arrie Senate, MD OB Fellow, [place] for [place] 09/01/2019 10:28 AM

## 2019-09-04 ENCOUNTER — Other Ambulatory Visit: Payer: Self-pay

## 2019-09-04 ENCOUNTER — Ambulatory Visit (INDEPENDENT_AMBULATORY_CARE_PROVIDER_SITE_OTHER): Payer: Medicaid Other | Admitting: Family Medicine

## 2019-09-04 DIAGNOSIS — Z91199 Patient's noncompliance with other medical treatment and regimen due to unspecified reason: Secondary | ICD-10-CM

## 2019-09-04 DIAGNOSIS — Z5329 Procedure and treatment not carried out because of patient's decision for other reasons: Secondary | ICD-10-CM

## 2019-09-04 NOTE — Progress Notes (Signed)
10:07 [place] not connected virtually. I called her phone number and left a message we are trying to reach you regarding your virtual visit; please connect virtually ; if we do not see you connected we will call back in a moment. Jerimah Witucki,RN  10:12 [place] not connected virtually for her postpartum visit. I called [facility] and asked if she is able to do her virtual visit. She states she did not know she has a visit and cannot do it right now because baby is crying; would like to reschedule. I explained registrar will contact her with new appointment. Argie Applegate,RN

## 2019-09-04 NOTE — Progress Notes (Signed)
Patient did not keep appointment today. She will be called to reschedule.  

## 2019-09-07 ENCOUNTER — Telehealth (INDEPENDENT_AMBULATORY_CARE_PROVIDER_SITE_OTHER): Payer: Medicaid Other | Admitting: Lactation Services

## 2019-09-07 DIAGNOSIS — T148XXA Other injury of unspecified body region, initial encounter: Secondary | ICD-10-CM

## 2019-09-07 DIAGNOSIS — N764 Abscess of vulva: Secondary | ICD-10-CM

## 2019-09-07 NOTE — Telephone Encounter (Signed)
Patient called in to say that she needs to be seen tomorrow as she has a swollen left labia and her incision still needs to be looked at again.   Patient reports she did not know her last appointments were virtual so did not keep the appointment.    She reports she has a knot on her labia and it is painful. There is no drainage. She has been using warm compresses to the area. The area has not come to a head. Patient is concerned area is infected.   Patient reports her c/s incision does still have some light brown drainage. She is cleaning and covering with a dressing, the drainage is on the dressing.   Patient reports she has been symptom free for 1 week and + covid test was on 8/9.   appt scheduled for tomorrow at 9:15. Patient voiced understanding.

## 2019-09-08 ENCOUNTER — Other Ambulatory Visit: Payer: Self-pay

## 2019-09-08 ENCOUNTER — Ambulatory Visit (INDEPENDENT_AMBULATORY_CARE_PROVIDER_SITE_OTHER): Payer: Medicaid Other | Admitting: Medical

## 2019-09-08 ENCOUNTER — Encounter: Payer: Self-pay | Admitting: Medical

## 2019-09-08 VITALS — BP 112/70 | HR 104 | Ht 64.0 in | Wt 224.0 lb

## 2019-09-08 DIAGNOSIS — L7682 Other postprocedural complications of skin and subcutaneous tissue: Secondary | ICD-10-CM | POA: Diagnosis not present

## 2019-09-08 MED ORDER — CLINDAMYCIN HCL 300 MG PO CAPS: 300.0000 mg | ORAL_CAPSULE | Freq: Three times a day (TID) | ORAL | 0 refills | Status: DC

## 2019-09-08 NOTE — Patient Instructions (Signed)
Cesarean Delivery, Care After This sheet gives you information about how to care for yourself after your procedure. Your health care provider may also give you more specific instructions. If you have problems or questions, contact your health care provider. What can I expect after the procedure? After the procedure, it is common to have:  A small amount of blood or clear fluid coming from the incision.  Some redness, swelling, and pain in your incision area.  Some abdominal pain and soreness.  Vaginal bleeding (lochia). Even though you did not have a vaginal delivery, you will still have vaginal bleeding and discharge.  Pelvic cramps.  Fatigue. You may have pain, swelling, and discomfort in the tissue between your vagina and your anus (perineum) if:  Your C-section was unplanned, and you were allowed to labor and push.  An incision was made in the area (episiotomy) or the tissue tore during attempted vaginal delivery. Follow these instructions at home: Incision care   Follow instructions from your health care provider about how to take care of your incision. Make sure you: ? Wash your hands with soap and water before you change your bandage (dressing). If soap and water are not available, use hand sanitizer. ? If you have a dressing, change it or remove it as told by your health care provider. ? Leave stitches (sutures), skin staples, skin glue, or adhesive strips in place. These skin closures may need to stay in place for 2 weeks or longer. If adhesive strip edges start to loosen and curl up, you may trim the loose edges. Do not remove adhesive strips completely unless your health care provider tells you to do that.  Check your incision area every day for signs of infection. Check for: ? More redness, swelling, or pain. ? More fluid or blood. ? Warmth. ? Pus or a bad smell.  Do not take baths, swim, or use a hot tub until your health care provider says it's okay. Ask your health  care provider if you can take showers.  When you cough or sneeze, hug a pillow. This helps with pain and decreases the chance of your incision opening up (dehiscing). Do this until your incision heals. Medicines  Take over-the-counter and prescription medicines only as told by your health care provider.  If you were prescribed an antibiotic medicine, take it as told by your health care provider. Do not stop taking the antibiotic even if you start to feel better.  Do not drive or use heavy machinery while taking prescription pain medicine. Lifestyle  Do not drink alcohol. This is especially important if you are breastfeeding or taking pain medicine.  Do not use any products that contain nicotine or tobacco, such as cigarettes, e-cigarettes, and chewing tobacco. If you need help quitting, ask your health care provider. Eating and drinking  Drink at least 8 eight-ounce glasses of water every day unless told not to by your health care provider. If you breastfeed, you may need to drink even more water.  Eat high-fiber foods every day. These foods may help prevent or relieve constipation. High-fiber foods include: ? Whole grain cereals and breads. ? Brown rice. ? Beans. ? Fresh fruits and vegetables. Activity   If possible, have someone help you care for your baby and help with household activities for at least a few days after you leave the hospital.  Return to your normal activities as told by your health care provider. Ask your health care provider what activities are safe for   you.  Rest as much as possible. Try to rest or take a nap while your baby is sleeping.  Do not lift anything that is heavier than 10 lbs (4.5 kg), or the limit that you were told, until your health care provider says that it is safe.  Talk with your health care provider about when you can engage in sexual activity. This may depend on your: ? Risk of infection. ? How fast you heal. ? Comfort and desire to  engage in sexual activity. General instructions  Do not use tampons or douches until your health care provider approves.  Wear loose, comfortable clothing and a supportive and well-fitting bra.  Keep your perineum clean and dry. Wipe from front to back when you use the toilet.  If you pass a blood clot, save it and call your health care provider to discuss. Do not flush blood clots down the toilet before you get instructions from your health care provider.  Keep all follow-up visits for you and your baby as told by your health care provider. This is important. Contact a health care provider if:  You have: ? A fever. ? Bad-smelling vaginal discharge. ? Pus or a bad smell coming from your incision. ? Difficulty or pain when urinating. ? A sudden increase or decrease in the frequency of your bowel movements. ? More redness, swelling, or pain around your incision. ? More fluid or blood coming from your incision. ? A rash. ? Nausea. ? Little or no interest in activities you used to enjoy. ? Questions about caring for yourself or your baby.  Your incision feels warm to the touch.  Your breasts turn red or become painful or hard.  You feel unusually sad or worried.  You vomit.  You pass a blood clot from your vagina.  You urinate more than usual.  You are dizzy or light-headed. Get help right away if:  You have: ? Pain that does not go away or get better with medicine. ? Chest pain. ? Difficulty breathing. ? Blurred vision or spots in your vision. ? Thoughts about hurting yourself or your baby. ? New pain in your abdomen or in one of your legs. ? A severe headache.  You faint.  You bleed from your vagina so much that you fill more than one sanitary pad in one hour. Bleeding should not be heavier than your heaviest period. Summary  After the procedure, it is common to have pain at your incision site, abdominal cramping, and slight bleeding from your vagina.  Check  your incision area every day for signs of infection.  Tell your health care provider about any unusual symptoms.  Keep all follow-up visits for you and your baby as told by your health care provider. This information is not intended to replace advice given to you by your health care provider. Make sure you discuss any questions you have with your health care provider. Document Revised: 07/14/2017 Document Reviewed: 07/14/2017 Elsevier Patient Education  2020 [employer]vier Inc.  

## 2019-09-08 NOTE — Progress Notes (Signed)
   History:  [name] is a 29 y.o. [id number] who presents to clinic today with complaint of labial pain and swelling. The patient recently had COVID and was not able to come into the office for incision checks. She had a very complicated PP incision complication which she would like to be checked. She states minimal drainage from the incision at this point. She states labial pain started 2 days ago. She had recently shaved for the first time since delivery. She has tried sitz baths and hot compresses with some relief. Pain was so severe last night she took a Percocet. She denies drainage or fever.    The following portions of the patient's history were reviewed and updated as appropriate: allergies, current medications, family history, past medical history, social history, past surgical history and problem list.  Review of Systems:  Review of Systems  Constitutional: Negative for fever.  Genitourinary:       Neg - vaginal bleeding, discharge  Skin:       Redness, swelling      Objective:  Physical Exam BP 112/70   Pulse (!) 104   Ht 5\' 4"  (1.626 m)   Wt 224 lb (101.6 kg)   Breastfeeding No   BMI 38.45 kg/m  Physical Exam Vitals and nursing note reviewed. Exam conducted with a chaperone present.  Constitutional:      General: She is not in acute distress.    Appearance: She is well-developed.  HENT:     Head: Normocephalic and atraumatic.  Cardiovascular:     Rate and Rhythm: Tachycardia present.  Pulmonary:     Effort: Pulmonary effort is normal.  Abdominal:     General: There is no distension.     Palpations: Abdomen is soft. There is no mass.     Tenderness: There is no abdominal tenderness.     Comments: Incision is healing well. Minimal surrounding erythema noted. No active drainage or bleeding.   Genitourinary:    Labia:        Left: No tenderness or lesion.     Skin:    General: Skin is warm and dry.     Findings: No erythema.  Neurological:     Mental  Status: She is alert and oriented to person, place, and time.  Psychiatric:        Mood and Affect: Mood normal.        Behavior: Behavior normal.     Assessment & Plan:  1. Other postoperative complication of skin - Likely a complicated folliculitis - Patient has many antibiotic allergies - clindamycin (CLEOCIN) 300 MG capsule; Take 1 capsule (300 mg total) by mouth 3 (three) times daily.  Dispense: 30 capsule; Refill: 0 - Continue hot compresses and sitz baths - Percocet or ibuprofen for pain - Call back Monday if worse or no improvement  Approximately 15 minutes of total time was spent with this patient on chart review, plan of care, exam and history taking.   Luvenia Redden, PA-C 09/08/2019 10:33 AM

## 2019-09-15 ENCOUNTER — Ambulatory Visit (INDEPENDENT_AMBULATORY_CARE_PROVIDER_SITE_OTHER): Payer: Medicaid Other | Admitting: Medical

## 2019-09-15 ENCOUNTER — Other Ambulatory Visit: Payer: Self-pay

## 2019-09-15 ENCOUNTER — Encounter: Payer: Self-pay | Admitting: Medical

## 2019-09-15 VITALS — BP 101/53 | HR 90 | Ht 64.0 in | Wt 225.0 lb

## 2019-09-15 DIAGNOSIS — L739 Follicular disorder, unspecified: Secondary | ICD-10-CM

## 2019-09-15 DIAGNOSIS — Z3009 Encounter for other general counseling and advice on contraception: Secondary | ICD-10-CM | POA: Diagnosis not present

## 2019-09-15 MED ORDER — NORGESTIMATE-ETH ESTRADIOL 0.25-35 MG-MCG PO TABS: 1.0000 | ORAL_TABLET | Freq: Every day | ORAL | 11 refills | Status: DC

## 2019-09-15 NOTE — Patient Instructions (Signed)

## 2019-09-15 NOTE — Progress Notes (Signed)
   History:  [name] is a 29 y.o. [id number] who presents to clinic today for re-evaluation of labial abscess from folliculitis. The patient states that she was looking at her vagina to check on active drainage of the labial abscess and thought it looked like she was having prolapse of her pelvic organs. She was concerned and wanted to have this evaluated. The abscess is draining and reduced significantly in size. She denies pain or fever. She still has a few days left on her antibiotics. She also would like to discuss OCPs for birth control and have her C/S incision re-evaluated today.    The following portions of the patient's history were reviewed and updated as appropriate: allergies, current medications, family history, past medical history, social history, past surgical history and problem list.  Review of Systems:  Review of Systems  Constitutional: Negative for fever.  Gastrointestinal: Negative for abdominal pain.  Genitourinary: Negative for dysuria.       + drainage Neg - vaginal bleeding, discharge      Objective:  Physical Exam BP (!) 101/53   Pulse 90   Ht 5\' 4"  (1.626 m)   Wt 225 lb (102.1 kg)   BMI 38.62 kg/m  Physical Exam Vitals and nursing note reviewed. Exam conducted with a chaperone present.  Constitutional:      General: She is not in acute distress.    Appearance: She is obese.  HENT:     Head: Normocephalic.  Cardiovascular:     Rate and Rhythm: Normal rate.  Pulmonary:     Effort: Pulmonary effort is normal.  Abdominal:     General: Abdomen is flat. There is no distension.     Palpations: Abdomen is soft.     Tenderness: There is no abdominal tenderness.     Comments: Incision is well-healed without active drainage or surrounding erythema. There is a small area of granulation tissue on the right side.   Genitourinary:    General: Normal vulva.     Comments: No evidence of prolapse with valsalva Skin:    General: Skin is warm and dry.    Neurological:     Mental Status: She is alert and oriented to person, place, and time.  Psychiatric:        Mood and Affect: Mood normal.     Assessment & Plan:  1. Unwanted fertility - norgestimate-ethinyl estradiol (ORTHO-CYCLEN) 0.25-35 MG-MCG tablet; Take 1 tablet by mouth daily.  Dispense: 28 tablet; Refill: 30 - Advised condom use until first pill pack is completed especially while on antibiotics   2. Folliculitis - Complete antibiotics - Continue sitz baths and hot compresses - Return for worsening symptoms or concerns for infection   Approximately 10 minutes of total time was spent with this patient on history taking, physical exam, chart review and documentation  Danielle Rankin 09/15/2019 8:55 AM

## 2019-09-17 ENCOUNTER — Other Ambulatory Visit: Payer: Self-pay | Admitting: Obstetrics & Gynecology

## 2019-09-17 DIAGNOSIS — I10 Essential (primary) hypertension: Secondary | ICD-10-CM

## 2019-09-20 ENCOUNTER — Ambulatory Visit: Payer: Medicaid Other | Admitting: Obstetrics & Gynecology

## 2019-09-20 DIAGNOSIS — Z419 Encounter for procedure for purposes other than remedying health state, unspecified: Secondary | ICD-10-CM | POA: Diagnosis not present

## 2019-09-21 ENCOUNTER — Other Ambulatory Visit: Payer: Self-pay | Admitting: Family Medicine

## 2019-09-21 NOTE — Telephone Encounter (Signed)
Requested medication (s) are due for refill today -yes  Requested medication (s) are on the active medication list -yes  Future visit scheduled -no  Last refill: 06/21/19  Notes to clinic: request medication last filled by historical provider  Requested Prescriptions  Pending Prescriptions Disp Refills   omeprazole (PRILOSEC) 20 MG capsule [Pharmacy Med Name: OMEPRAZOLE 20MG  CAPSULES] 90 capsule     Sig: TAKE 1 CAPSULE(20 MG) BY MOUTH DAILY      Gastroenterology: Proton Pump Inhibitors Passed - 09/21/2019 10:36 AM      Passed - Valid encounter within last 12 months    Recent Outpatient Visits           9 months ago First trimester pregnancy   [place], Charlane Ferretti, MD   1 year ago Hypothyroidism, unspecified type   [place], Charlane Ferretti, MD   1 year ago Fever, unspecified fever cause   [place], [place], MD   1 year ago Sore throat   [place], [place], MD   2 years ago Edema, unspecified type   [place], [place], MD                  Requested Prescriptions  Pending Prescriptions Disp Refills   omeprazole (PRILOSEC) 20 MG capsule [Pharmacy Med Name: OMEPRAZOLE 20MG  CAPSULES] 90 capsule     Sig: TAKE 1 CAPSULE(20 MG) BY MOUTH DAILY      Gastroenterology: Proton Pump Inhibitors Passed - 09/21/2019 10:36 AM      Passed - Valid encounter within last 12 months    Recent Outpatient Visits           9 months ago First trimester pregnancy   [place], Charlane Ferretti, MD   1 year ago Hypothyroidism, unspecified type   [place], Charlane Ferretti, MD   1 year ago Fever, unspecified fever cause   [place], [place], MD   1 year ago Sore throat   [place], MD   2 years ago Edema, unspecified type   [facility] Charlott Rakes, MD

## 2019-09-29 ENCOUNTER — Encounter: Payer: Self-pay | Admitting: Medical

## 2019-09-29 ENCOUNTER — Ambulatory Visit: Payer: Medicaid Other | Admitting: Medical

## 2019-10-02 ENCOUNTER — Other Ambulatory Visit: Payer: Self-pay | Admitting: Family Medicine

## 2019-10-02 ENCOUNTER — Encounter: Payer: Self-pay | Admitting: Medical

## 2019-10-02 ENCOUNTER — Encounter: Payer: Self-pay | Admitting: Family Medicine

## 2019-10-02 MED ORDER — OMEPRAZOLE 20 MG PO CPDR
20.0000 mg | DELAYED_RELEASE_CAPSULE | Freq: Every day | ORAL | 0 refills | Status: DC
Start: 2019-10-02 — End: 2019-10-20

## 2019-10-03 ENCOUNTER — Encounter: Payer: Self-pay | Admitting: Advanced Practice Midwife

## 2019-10-07 ENCOUNTER — Other Ambulatory Visit: Payer: Self-pay | Admitting: Obstetrics & Gynecology

## 2019-10-12 ENCOUNTER — Ambulatory Visit: Payer: Medicaid Other | Admitting: Obstetrics & Gynecology

## 2019-10-20 ENCOUNTER — Encounter: Payer: Self-pay | Admitting: Medical

## 2019-10-20 ENCOUNTER — Other Ambulatory Visit: Payer: Self-pay

## 2019-10-20 ENCOUNTER — Ambulatory Visit (INDEPENDENT_AMBULATORY_CARE_PROVIDER_SITE_OTHER): Payer: Medicaid Other | Admitting: Medical

## 2019-10-20 VITALS — BP 116/74 | HR 85 | Ht 64.0 in | Wt 229.4 lb

## 2019-10-20 DIAGNOSIS — Z3009 Encounter for other general counseling and advice on contraception: Secondary | ICD-10-CM

## 2019-10-20 DIAGNOSIS — F32A Depression, unspecified: Secondary | ICD-10-CM

## 2019-10-20 DIAGNOSIS — Z419 Encounter for procedure for purposes other than remedying health state, unspecified: Secondary | ICD-10-CM | POA: Diagnosis not present

## 2019-10-20 DIAGNOSIS — O0990 Supervision of high risk pregnancy, unspecified, unspecified trimester: Secondary | ICD-10-CM

## 2019-10-20 DIAGNOSIS — F419 Anxiety disorder, unspecified: Secondary | ICD-10-CM

## 2019-10-20 DIAGNOSIS — K0889 Other specified disorders of teeth and supporting structures: Secondary | ICD-10-CM

## 2019-10-20 DIAGNOSIS — Z8632 Personal history of gestational diabetes: Secondary | ICD-10-CM

## 2019-10-20 DIAGNOSIS — E071 Dyshormogenetic goiter: Secondary | ICD-10-CM | POA: Diagnosis not present

## 2019-10-20 DIAGNOSIS — K219 Gastro-esophageal reflux disease without esophagitis: Secondary | ICD-10-CM

## 2019-10-20 MED ORDER — FLUOXETINE HCL 20 MG PO CAPS: 20.0000 mg | ORAL_CAPSULE | Freq: Every day | ORAL | 0 refills | Status: DC

## 2019-10-20 MED ORDER — OXYCODONE-ACETAMINOPHEN 5-325 MG PO TABS: 1.0000 | ORAL_TABLET | Freq: Four times a day (QID) | ORAL | 0 refills | Status: DC | PRN

## 2019-10-20 MED ORDER — OMEPRAZOLE 20 MG PO CPDR: 20.0000 mg | DELAYED_RELEASE_CAPSULE | Freq: Every day | ORAL | 3 refills | Status: DC

## 2019-10-20 NOTE — Progress Notes (Signed)
    [place] Partum Visit Note  Kimberly [name] is a 29 y.o. [phone] female who presents for a postpartum visit. She is 11 weeks postpartum following a primary cesarean section.  I have fully reviewed the prenatal and intrapartum course. The delivery was at 39/1 gestational weeks.  Anesthesia: epidural. Postpartum course has been complicated by HTN. Baby is doing well. Baby is feeding by bottle - [place] Good Start. Bleeding no bleeding. Bowel function is normal. Bladder function is normal. Patient is sexually active. Contraception method is condoms. Postpartum depression screening: negative.    The following portions of the patient's history were reviewed and updated as appropriate: allergies, current medications, past family history, past medical history, past social history, past surgical history and problem list.  Review of Systems Pertinent items are noted in HPI.    Objective:  Blood pressure 116/74, pulse 85, height 5\' 4"  (1.626 m), weight 229 lb 6.4 oz (104.1 kg), not currently breastfeeding.  General:  alert and cooperative   Breasts:  not performed  Lungs: clear to auscultation bilaterally  Heart:  regular rate and rhythm, S1, S2 normal, no murmur, click, rub or gallop  Abdomen: soft, non-tender; bowel sounds normal; no masses,  no organomegaly   Vulva:  not evaluated  Vagina: not evaluated  Cervix:  not evaluated  Corpus: not examined  Adnexa:  not evaluated  Rectal Exam: Not performed.        Assessment:    Normal postpartum exam.  History of GDM History of PP HTN Anxiety and Depression GERD Hypothyroidism   Plan:   Essential components of care per ACOG recommendations:  1.  Mood and well being: Patient with negative depression screening today. Reviewed local resources for support.  - Patient does not use tobacco.  - hx of drug use? No    2. Infant care and feeding:  -Patient currently breastmilk feeding? No  -Social determinants of health (SDOH) reviewed in  EPIC. The following needs were identified - baby supplies, diapers, formulas - has [place]  3. Sexuality, contraception and birth spacing - Patient does not want a pregnancy in the next year.  Desired family size is 2 children.  - Reviewed forms of contraception in tiered fashion. Patient desired oral contraceptives (estrogen/progesterone) next week if BP check is normal. Condoms until then.  - Discussed birth spacing of 18 months  4. Sleep and fatigue -Encouraged family/partner/community support of 4 hrs of uninterrupted sleep to help with mood and fatigue  5. Physical Recovery  - Discussed patients delivery and complications - Patient has urinary incontinence? No  - Patient is safe to resume physical and sexual activity  6.  Health Maintenance - Last pap smear done 12/2018 and was normal with negative HPV.  7. History of GDM - Will follow-up next week for fasting 2 hour  8. GERD - Prilosec refilled   9. Tooth pain Rx Percocet for tooth pain Advised to request referral for Dentist from PCP next week   10. Hypothyroid - Patient feels her hair has been falling out more recently, requests retesting of thyroid today   11. Anxiety and Depression  Rx Prozac refilled for once daily until PCP appointment   12. History of PP HTN Patient will discontinue Norvasc today and return for BP check in 1 week If BP is normal, can consider starting OCPs as previously prescribed   Kerry Hough, [place] for [employer], [place]

## 2019-10-20 NOTE — Patient Instructions (Signed)
Thyroid-Stimulating Hormone Test Why am I having this test? You may have a thyroid-stimulating hormone (TSH) test if you have possible symptoms of abnormal thyroid hormone levels. This test can help your health care provider:  Diagnose a disorder of the thyroid gland or pituitary gland.  Manage your condition and treatment if you have an underactive thyroid (hypothyroidism) or an overactive thyroid (hyperthyroidism). Newborn babies may have this test done to screen for hypothyroidism that is present at birth (congenital). The thyroid is a gland in the lower front of the neck. It makes hormones that affect many body parts and systems, including the system that affects how quickly the body burns fuel for energy (metabolism). The pituitary gland is located just below the brain, behind the eyes and nasal passages. It helps maintain thyroid hormone levels and thyroid gland function. What is being tested? This test measures the amount of TSH in your blood. TSH may also be called thyrotropin. When the thyroid does not make enough hormones, the pituitary gland releases TSH into the bloodstream to stimulate the thyroid gland to make more hormones. What kind of sample is taken?     A blood sample is required for this test. It is usually collected by inserting a needle into a blood vessel. For newborns, a small amount of blood may be collected from the umbilical cord, or by using a small needle to prick the baby's heel (heel stick). Tell a health care provider about:  All medicines you are taking, including vitamins, herbs, eye drops, creams, and over-the-counter medicines.  Any blood disorders you have.  Any surgeries you have had.  Any medical conditions you have.  Whether you are pregnant or may be pregnant. How are the results reported? Your test results will be reported as a value that indicates how much TSH is in your blood. Your health care provider will compare your results to normal ranges  that were established after testing a large group of people (reference ranges). Reference ranges may vary among labs and hospitals. For this test, common reference ranges are:  Adult: 2-10 microunits/mL or 2-10 milliunits/L.  Newborn: ? Heel stick: 3-18 microunits/mL or 3-18 milliunits/L. ? Umbilical cord: 6-64 microunits/mL or 3-12 milliunits/L. What do the results mean? Results that are within the reference range are considered normal. This means that you have a normal amount of TSH in your blood. Results that are higher than the reference range mean that your TSH levels are too high. This may mean:  Your thyroid gland is not making enough thyroid hormones.  Your thyroid medicine dosage is too low.  You have a tumor on your pituitary gland. This is rare. Results that are lower than the reference range mean that your TSH levels are too low. This may be caused by hyperthyroidism or by a problem with the pituitary gland function. Talk with your health care provider about what your results mean. Questions to ask your health care provider Ask your health care provider, or the department that is doing the test:  When will my results be ready?  How will I get my results?  What are my treatment options?  What other tests do I need?  What are my next steps? Summary  You may have a thyroid-stimulating hormone (TSH) test if you have possible symptoms of abnormal thyroid hormone levels.  The thyroid is a gland in the lower front of the neck. It makes hormones that affect many body parts and systems.  The pituitary gland is located  just below the brain, behind the eyes and nasal passages. It helps maintain thyroid hormone levels and thyroid gland function.  This test measures the amount of TSH in your blood. TSH is made by the pituitary gland. It may also be called thyrotropin. This information is not intended to replace advice given to you by your health care provider. Make sure you  discuss any questions you have with your health care provider. Document Revised: 04/05/2017 Document Reviewed: 09/08/2016 Elsevier Patient Education  2020 Elsevier Inc.  

## 2019-10-21 LAB — TSH: TSH: 0.013 u[IU]/mL — ABNORMAL LOW (ref 0.450–4.500)

## 2019-10-21 LAB — T4: T4, Total: 9.6 ug/dL (ref 4.5–12.0)

## 2019-10-21 LAB — T3: T3, Total: 163 ng/dL (ref 71–180)

## 2019-10-24 ENCOUNTER — Telehealth: Payer: Self-pay

## 2019-10-24 ENCOUNTER — Other Ambulatory Visit: Payer: Self-pay

## 2019-10-24 ENCOUNTER — Ambulatory Visit: Payer: Medicaid Other | Attending: Family Medicine | Admitting: Family Medicine

## 2019-10-24 ENCOUNTER — Other Ambulatory Visit: Payer: Self-pay | Admitting: Medical

## 2019-10-24 ENCOUNTER — Encounter: Payer: Self-pay | Admitting: Family Medicine

## 2019-10-24 VITALS — BP 124/76 | HR 94 | Ht 64.0 in | Wt 227.0 lb

## 2019-10-24 DIAGNOSIS — E039 Hypothyroidism, unspecified: Secondary | ICD-10-CM

## 2019-10-24 DIAGNOSIS — F3289 Other specified depressive episodes: Secondary | ICD-10-CM

## 2019-10-24 DIAGNOSIS — F32A Depression, unspecified: Secondary | ICD-10-CM

## 2019-10-24 DIAGNOSIS — F419 Anxiety disorder, unspecified: Secondary | ICD-10-CM | POA: Diagnosis not present

## 2019-10-24 DIAGNOSIS — R42 Dizziness and giddiness: Secondary | ICD-10-CM

## 2019-10-24 DIAGNOSIS — Z8719 Personal history of other diseases of the digestive system: Secondary | ICD-10-CM | POA: Diagnosis not present

## 2019-10-24 DIAGNOSIS — N83209 Unspecified ovarian cyst, unspecified side: Secondary | ICD-10-CM

## 2019-10-24 MED ORDER — HYDROXYZINE HCL 25 MG PO TABS: 25.0000 mg | ORAL_TABLET | Freq: Three times a day (TID) | ORAL | 1 refills | Status: DC | PRN

## 2019-10-24 MED ORDER — LEVOTHYROXINE SODIUM 88 MCG PO TABS: 88.0000 ug | ORAL_TABLET | Freq: Every day | ORAL | 0 refills | Status: DC

## 2019-10-24 MED ORDER — FLUOXETINE HCL 20 MG PO CAPS: 20.0000 mg | ORAL_CAPSULE | Freq: Every day | ORAL | 1 refills | Status: DC

## 2019-10-24 MED ORDER — MECLIZINE HCL 25 MG PO TABS: 25.0000 mg | ORAL_TABLET | Freq: Three times a day (TID) | ORAL | 1 refills | Status: DC | PRN

## 2019-10-24 NOTE — Telephone Encounter (Signed)
Call Pt to advise of [place] appointment on 11/02/19 at 8am. No answer, left VM & advised also showing in My Chart.

## 2019-10-24 NOTE — Progress Notes (Signed)
Needs refills on medications.  Wants to discuss restarting some medications after pregnancy.

## 2019-10-24 NOTE — Patient Instructions (Signed)

## 2019-10-24 NOTE — Progress Notes (Signed)
Subjective:  Patient ID: Kimberly [name], female    DOB: 01-Mar-1990  Age: 29 y.o. MRN: [id number]  CC: Hypothyroidism   HPI Kimberly [name] is a 29 year old female with history of anxiety and depression, hypothyroidism, chronic low back pain who presents today for continuity of care postpartum. 10 weeks post partum and feels well and her baby is doing well. She would like to get back on Hydroxyzine and Meclizine which she took prior to her pregnancy last year. Was on Enalapril post partum due to postpartum hypertension but was told to stop by GYN and did not take it today. She had gestational diabetes which was diet-controlled.  She would like to see a dentist for routine dental care.  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    Iron deficiency anemia    Migraines    "weekly" (11/16/2012)   Oligomenorrhea 03/01/2011   Ovarian cyst    Pregnancy induced hypertension    Schizophrenia ([place])    "moderate" (11/16/2012)   Scoliosis     Past Surgical History:  Procedure Laterality Date   CESAREAN SECTION N/A 08/02/2019   Procedure: CESAREAN SECTION;  Surgeon: Gwynne Edinger, MD;  Location: [facility];  Service: Obstetrics;  Laterality: N/A;   LAPAROSCOPIC OVARIAN CYSTECTOMY Left 02/11/2016   Procedure: LAPAROSCOPIC OVARIAN CYSTECTOMY;  Surgeon: Emily Filbert, MD;  Location: [facility];  Service: Gynecology;  Laterality: Left;   UPPER GI ENDOSCOPY  ~ 2006; ~2008    Family History  Problem Relation Age of Onset   Diabetes Mother    Hypertension Mother    Vision loss Mother    Hypertension Father    Hyperlipidemia  Father    Thyroid disease Maternal Grandmother     Allergies  Allergen Reactions   Cold & Cough Daytime [Acetaminophen-Dm] Shortness Of Breath    Could not tolerate- orange liquid, may have contained Pseudoephedrine- "I could not tolerate"   Sulfa Antibiotics Swelling   Zofran [Ondansetron] Other (See Comments)    Muscle Spasms   Abilify [Aripiprazole] Other (See Comments)    Lethargy, unable to function   Amoxicillin-Pot Clavulanate Other (See Comments)    Stomach pains Has patient had a PCN reaction causing immediate rash, facial/tongue/throat swelling, SOB or lightheadedness with hypotension: No Has patient had a PCN reaction causing severe rash involving mucus membranes or skin necrosis: No Has patient had a PCN reaction that required hospitalization No Has patient had a PCN reaction occurring within the last 10 years: Yes If all of the above answers are "NO", then may proceed with Cephalosporin use.    Cephalexin Other (See Comments)    Stomach pains Pt taking Cefadroxil as outpatient   Latex Swelling and Other (See Comments)    Burning of skin, too   Phenergan [Promethazine]     'gave me twitches' 'not able to sit still'   Tape Other (See Comments)    Swelling, burning of skin .    Zicam Cold Remedy [Homeopathic Products] Nausea And Vomiting   Keflex [Cephalexin] Rash and Other (See Comments)  Rash--able to take w/ Benadryl   Vicodin [Hydrocodone-Acetaminophen] Hives and Rash    Per pt- makes her very sleepy, feels like she isn't getting her breath. States she does not have throat/tongue swelling or anaphylaxis.    Outpatient Medications Prior to Visit  Medication Sig Dispense Refill   omeprazole (PRILOSEC) 20 MG capsule Take 1 capsule (20 mg total) by mouth daily. 90 capsule 3   FLUoxetine (PROZAC) 20 MG capsule Take 1 capsule (20 mg total) by mouth daily. 8 capsule 0   levothyroxine (SYNTHROID) 100 MCG tablet Take 100 mcg by mouth daily before  breakfast.     acetaminophen (TYLENOL) 325 MG tablet Take 650 mg by mouth every 6 (six) hours as needed. (Patient not taking: Reported on 09/15/2019)     enalapril (VASOTEC) 5 MG tablet TAKE 1 TABLET(5 MG) BY MOUTH DAILY (Patient not taking: Reported on 10/24/2019) 30 tablet 0   norgestimate-ethinyl estradiol (ORTHO-CYCLEN) 0.25-35 MG-MCG tablet Take 1 tablet by mouth daily. (Patient not taking: Reported on 10/20/2019) 28 tablet 11   oxyCODONE-acetaminophen (PERCOCET/ROXICET) 5-325 MG tablet Take 1 tablet by mouth every 6 (six) hours as needed for moderate pain. (Patient not taking: Reported on 10/24/2019) 10 tablet 0   No facility-administered medications prior to visit.     ROS Review of Systems  Constitutional: Negative for activity change, appetite change and fatigue.  HENT: Negative for congestion, sinus pressure and sore throat.   Eyes: Negative for visual disturbance.  Respiratory: Negative for cough, chest tightness, shortness of breath and wheezing.   Cardiovascular: Negative for chest pain and palpitations.  Gastrointestinal: Negative for abdominal distention, abdominal pain and constipation.  Endocrine: Negative for polydipsia.  Genitourinary: Negative for dysuria and frequency.  Musculoskeletal: Negative for arthralgias and back pain.  Skin: Negative for rash.  Neurological: Negative for tremors, light-headedness and numbness.  Hematological: Does not bruise/bleed easily.  Psychiatric/Behavioral: Negative for agitation and behavioral problems.    Objective:  BP 124/76    Pulse 94    Ht 5\' 4"  (1.626 m)    Wt 227 lb (103 kg)    SpO2 98%    BMI 38.96 kg/m   BP/Weight 10/24/2019 10/20/2019 9/52/8413  Systolic BP 244 010 272  Diastolic BP 76 74 53  Wt. (Lbs) 227 229.4 225  BMI 38.96 39.38 38.62  Some encounter information is confidential and restricted. Go to Review Flowsheets activity to see all data.      Physical Exam Constitutional:      Appearance: She is  well-developed.  Neck:     Vascular: No JVD.  Cardiovascular:     Rate and Rhythm: Normal rate.     Heart sounds: Normal heart sounds. No murmur heard.   Pulmonary:     Effort: Pulmonary effort is normal.     Breath sounds: Normal breath sounds. No wheezing or rales.  Chest:     Chest wall: No tenderness.  Abdominal:     General: Bowel sounds are normal. There is no distension.     Palpations: Abdomen is soft. There is no mass.     Tenderness: There is no abdominal tenderness.  Musculoskeletal:        General: Normal range of motion.     Right lower leg: No edema.     Left lower leg: No edema.  Neurological:     Mental Status: She is alert and oriented to person, place, and time.  Psychiatric:        Mood and Affect: Mood normal.  CMP Latest Ref Rng & Units 08/10/2019 08/04/2019 08/03/2019  Glucose 70 - 99 mg/dL 101(H) 86 111(H)  BUN 6 - 20 mg/dL 5(L) 8 10  Creatinine 0.44 - 1.00 mg/dL 0.53 0.54 0.56  Sodium 135 - 145 mmol/L 138 141 135  Potassium 3.5 - 5.1 mmol/L 2.8(L) 3.4(L) 3.8  Chloride 98 - 111 mmol/L 99 108 105  CO2 22 - 32 mmol/L 26 23 22   Calcium 8.9 - 10.3 mg/dL 8.0(L) 8.5(L) 8.5(L)  Total Protein 6.5 - 8.1 g/dL 4.7(L) 4.9(L) 4.5(L)  Total Bilirubin 0.3 - 1.2 mg/dL 1.3(H) 0.3 0.7  Alkaline Phos 38 - 126 U/L 113 105 97  AST 15 - 41 U/L 21 20 21   ALT 0 - 44 U/L 18 12 12     Lipid Panel     Component Value Date/Time   CHOL 178 06/08/2016 0936   TRIG 237 (H) 06/08/2016 0936   HDL 41 06/08/2016 0936   CHOLHDL 4.3 06/08/2016 0936   CHOLHDL 4.4 08/07/2013 1141   VLDL 29 08/07/2013 1141   LDLCALC 90 06/08/2016 0936    CBC    Component Value Date/Time   WBC 9.0 08/21/2019 1055   WBC 11.4 (H) 08/12/2019 0509   RBC 4.37 08/21/2019 1055   RBC 4.03 08/12/2019 0509   HGB 11.0 (L) 08/21/2019 1055   HCT 35.2 08/21/2019 1055   PLT 427 08/21/2019 1055   MCV 81 08/21/2019 1055   MCH 25.2 (L) 08/21/2019 1055   MCH 24.1 (L) 08/12/2019 0509   MCHC 31.3 (L)  08/21/2019 1055   MCHC 29.2 (L) 08/12/2019 0509   RDW 17.6 (H) 08/21/2019 1055   LYMPHSABS 1.1 08/21/2019 1055   MONOABS 1.1 (H) 08/10/2019 0524   EOSABS 0.1 08/21/2019 1055   BASOSABS 0.0 08/21/2019 1055    Lab Results  Component Value Date   HGBA1C 4.9 06/28/2019   Lab Results  Component Value Date   TSH 0.013 (L) 10/20/2019     Assessment & Plan:  1. Anxiety and depression Controlled - hydrOXYzine (ATARAX/VISTARIL) 25 MG tablet; Take 1 tablet (25 mg total) by mouth 3 (three) times daily as needed.  Dispense: 180 tablet; Refill: 1 - FLUoxetine (PROZAC) 20 MG capsule; Take 1 capsule (20 mg total) by mouth daily.  Dispense: 90 capsule; Refill: 1  2. Hypothyroidism, unspecified type Last TSH was suppressed Decrease Levothyroxine dose - levothyroxine (SYNTHROID) 88 MCG tablet; Take 1 tablet (88 mcg total) by mouth daily before breakfast.  Dispense: 90 tablet; Refill: 0 - T4, free; Future - TSH; Future  3. History of dental problems - Ambulatory referral to Dentistry  4. Other depression Controlled on Prozac  5. Vertigo Restarted Meclizine per request - meclizine (ANTIVERT) 25 MG tablet; Take 1 tablet (25 mg total) by mouth 3 (three) times daily as needed for dizziness.  Dispense: 180 tablet; Refill: 1    Meds ordered this encounter  Medications   hydrOXYzine (ATARAX/VISTARIL) 25 MG tablet    Sig: Take 1 tablet (25 mg total) by mouth 3 (three) times daily as needed.    Dispense:  180 tablet    Refill:  1   meclizine (ANTIVERT) 25 MG tablet    Sig: Take 1 tablet (25 mg total) by mouth 3 (three) times daily as needed for dizziness.    Dispense:  180 tablet    Refill:  1   levothyroxine (SYNTHROID) 88 MCG tablet    Sig: Take 1 tablet (88 mcg total) by mouth daily before breakfast.  Dispense:  90 tablet    Refill:  0    Dose change   FLUoxetine (PROZAC) 20 MG capsule    Sig: Take 1 capsule (20 mg total) by mouth daily.    Dispense:  90 capsule    Refill:   1    Follow-up: Return in about 6 months (around 04/23/2020) for Chronic disease management.       Charlott Rakes, MD, FAAFP. [facility] and [place], [place]   10/24/2019, 11:00 AM

## 2019-10-27 ENCOUNTER — Encounter: Payer: Medicaid Other | Admitting: Medical

## 2019-10-27 ENCOUNTER — Telehealth: Payer: Self-pay | Admitting: Lactation Services

## 2019-10-27 ENCOUNTER — Other Ambulatory Visit: Payer: Self-pay | Admitting: Family Medicine

## 2019-10-27 DIAGNOSIS — E039 Hypothyroidism, unspecified: Secondary | ICD-10-CM

## 2019-10-27 MED ORDER — LEVOTHYROXINE SODIUM 75 MCG PO TABS: 75.0000 ug | ORAL_TABLET | Freq: Every day | ORAL | 1 refills | Status: DC

## 2019-10-27 NOTE — Telephone Encounter (Signed)
Called patient to inform her of results and recommendation for medication change. She did not answer. LM for patient to call the office for results and medication management.   Will send My Chart message.

## 2019-10-27 NOTE — Telephone Encounter (Signed)
-----   Message from Donnamae Jude, MD sent at 10/27/2019 11:09 AM EDT ----- It appears her thyroid is over treated--would drop to 75 mcg and re-check in 6 wks. Can f/u with PCP if needed. I will place the order--please call and inform her.

## 2019-11-02 ENCOUNTER — Other Ambulatory Visit: Payer: Self-pay

## 2019-11-02 ENCOUNTER — Ambulatory Visit
Admission: RE | Admit: 2019-11-02 | Discharge: 2019-11-02 | Disposition: A | Discharge status: Home / Self Care | Payer: Medicaid Other | Source: Ambulatory Visit | Attending: Medical | Admitting: Medical

## 2019-11-02 DIAGNOSIS — N83209 Unspecified ovarian cyst, unspecified side: Secondary | ICD-10-CM | POA: Insufficient documentation

## 2019-11-02 DIAGNOSIS — N838 Other noninflammatory disorders of ovary, fallopian tube and broad ligament: Secondary | ICD-10-CM | POA: Diagnosis not present

## 2019-11-02 DIAGNOSIS — N281 Cyst of kidney, acquired: Secondary | ICD-10-CM | POA: Diagnosis not present

## 2019-11-02 DIAGNOSIS — N83202 Unspecified ovarian cyst, left side: Secondary | ICD-10-CM | POA: Diagnosis not present

## 2019-11-20 DIAGNOSIS — Z419 Encounter for procedure for purposes other than remedying health state, unspecified: Secondary | ICD-10-CM | POA: Diagnosis not present

## 2019-11-28 ENCOUNTER — Ambulatory Visit: Payer: Medicaid Other | Attending: Family Medicine | Admitting: Family Medicine

## 2019-11-28 ENCOUNTER — Other Ambulatory Visit: Payer: Self-pay

## 2019-11-28 ENCOUNTER — Encounter: Payer: Self-pay | Admitting: Family Medicine

## 2019-11-28 DIAGNOSIS — J069 Acute upper respiratory infection, unspecified: Secondary | ICD-10-CM | POA: Diagnosis not present

## 2019-11-28 MED ORDER — FLUTICASONE PROPIONATE 50 MCG/ACT NA SUSP: 2.0000 | Freq: Every day | NASAL | 1 refills | Status: DC

## 2019-11-28 MED ORDER — CETIRIZINE HCL 10 MG PO TABS: 10.0000 mg | ORAL_TABLET | Freq: Every day | ORAL | 1 refills | Status: DC

## 2019-11-28 NOTE — Progress Notes (Signed)
Has a sore throat for 2 days, states that she throw up all day yesterday.  States that she is very congested.

## 2019-11-28 NOTE — Progress Notes (Signed)
Virtual Visit via Video Note  I connected with Kimberly [name], on 11/28/2019 at 3:29 PM by video enabled telemedicine device due to the COVID-19 pandemic and verified that I am speaking with the correct person using two identifiers.   Consent: I discussed the limitations, risks, security and privacy concerns of performing an evaluation and management service by telemedicine and the availability of in person appointments. I also discussed with the patient that there may be a patient responsible charge related to this service. The patient expressed understanding and agreed to proceed.   Location of Patient: Environmental education officer of Provider: Clinic   Persons participating in Telemedicine visit: Dorene Bruni [place]-CMA Dr. Margarita Rana     History of Present Illness: She is a 29 year old female with history of anxiety and depression, hypothyroidism, chronic low back pain, history of Covid in 07/2019 who presents today for an acute visit. She woke up yesterday with nasal congestion, postnasal drip, sore throat, cough and vomiting. This morning she had a low-grade fever of 100.2. She has no facial pressure or pain and has no dyspnea, chest pain, myalgias or malaise. Her infant and toddler were diagnosed with an upper respiratory infection by the pediatrician after testing negative for COVID-19.  She did have COVID-19 in 07/2019 but states this feels like nothing compared to what she had previously. She has not used any OTC medications as she is not sure what to use. She is not nursing. Currently requesting a note of excuse from work as she left work early yesterday and was out today. She only has to work on Wednesday but does not work the rest of the week until Monday next week.  Past Medical History:  Diagnosis Date  . Acanthosis nigricans, acquired 03/01/2011  . Anxiety   . Asthma    "grew out of it" (11/16/2012)  . Chronic back pain   . Complication of anesthesia    "takes a lot to put  me to sleep; woke up completely mid endoscopy" (11/16/2012)  . Depression   . Dysrhythmia    ST (11/16/2012)  . Gastric reflux   . 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 Iron deficiency anemia   . Migraines    "weekly" (11/16/2012)  . Oligomenorrhea 03/01/2011  . Ovarian cyst   . Pregnancy induced hypertension   . Schizophrenia ([place])    "moderate" (11/16/2012)  . Scoliosis    Allergies  Allergen Reactions  . Cold & Cough Daytime [Acetaminophen-Dm] Shortness Of Breath    Could not tolerate- orange liquid, may have contained Pseudoephedrine- "I could not tolerate"  . Sulfa Antibiotics Swelling  . Zofran [Ondansetron] Other (See Comments)    Muscle Spasms  . Abilify [Aripiprazole] Other (See Comments)    Lethargy, unable to function  . Amoxicillin-Pot Clavulanate Other (See Comments)    Stomach pains Has patient had a PCN reaction causing immediate rash, facial/tongue/throat swelling, SOB or lightheadedness with hypotension: No Has patient had a PCN reaction causing severe rash involving mucus membranes or skin necrosis: No Has patient had a PCN reaction that required hospitalization No Has patient had a PCN reaction occurring within the last 10 years: Yes If all of the above answers are "NO", then may proceed with Cephalosporin use.   . Cephalexin Other (See Comments)    Stomach pains Pt taking Cefadroxil as outpatient  . Latex Swelling and Other (See Comments)    Burning of skin, too  . Phenergan [Promethazine]     'gave  me twitches' 'not able to sit still'  . Tape Other (See Comments)    Swelling, burning of skin .   Marland Kitchen Zicam Cold Remedy [Homeopathic Products] Nausea And Vomiting  . Keflex [Cephalexin] Rash and Other (See Comments)    Rash--able to take w/ Benadryl  . Vicodin [Hydrocodone-Acetaminophen] Hives and Rash    Per pt- makes her very sleepy, feels like she isn't getting her breath. States she does not have throat/tongue swelling or  anaphylaxis.    Current Outpatient Medications on File Prior to Visit  Medication Sig Dispense Refill  . FLUoxetine (PROZAC) 20 MG capsule Take 1 capsule (20 mg total) by mouth daily. 90 capsule 1  . hydrOXYzine (ATARAX/VISTARIL) 25 MG tablet Take 1 tablet (25 mg total) by mouth 3 (three) times daily as needed. 180 tablet 1  . levothyroxine (SYNTHROID) 75 MCG tablet Take 1 tablet (75 mcg total) by mouth daily before breakfast. 90 tablet 1  . meclizine (ANTIVERT) 25 MG tablet Take 1 tablet (25 mg total) by mouth 3 (three) times daily as needed for dizziness. 180 tablet 1  . omeprazole (PRILOSEC) 20 MG capsule Take 1 capsule (20 mg total) by mouth daily. 90 capsule 3  . acetaminophen (TYLENOL) 325 MG tablet Take 650 mg by mouth every 6 (six) hours as needed. (Patient not taking: Reported on 09/15/2019)    . enalapril (VASOTEC) 5 MG tablet TAKE 1 TABLET(5 MG) BY MOUTH DAILY (Patient not taking: Reported on 10/24/2019) 30 tablet 0  . norgestimate-ethinyl estradiol (ORTHO-CYCLEN) 0.25-35 MG-MCG tablet Take 1 tablet by mouth daily. (Patient not taking: Reported on 10/20/2019) 28 tablet 11  . oxyCODONE-acetaminophen (PERCOCET/ROXICET) 5-325 MG tablet Take 1 tablet by mouth every 6 (six) hours as needed for moderate pain. (Patient not taking: Reported on 10/24/2019) 10 tablet 0   No current facility-administered medications on file prior to visit.    Observations/Objective: Awake, alert, oriented x3 Not in acute distress No tenderness to palpation of maxillary, frontal and ethmoidal sinuses Examination of oropharynx without erythema or exudates.  Assessment and Plan: 1. Upper respiratory tract infection, unspecified type Advised to use nasal saline irrigation Use Tylenol OTC as needed headache or myalgia Advised if symptoms persist or not improving she will need a reevaluation. Note for work has been provided. - fluticasone (FLONASE) 50 MCG/ACT nasal spray; Place 2 sprays into both nostrils daily.   Dispense: 16 g; Refill: 1 - cetirizine (ZYRTEC) 10 MG tablet; Take 1 tablet (10 mg total) by mouth daily.  Dispense: 30 tablet; Refill: 1   Follow Up Instructions: Keep previously scheduled appointment   I discussed the assessment and treatment plan with the patient. The patient was provided an opportunity to ask questions and all were answered. The patient agreed with the plan and demonstrated an understanding of the instructions.   The patient was advised to call back or seek an in-person evaluation if the symptoms worsen or if the condition fails to improve as anticipated.     I provided 13 minutes total of Telehealth time during this encounter including median intraservice time, reviewing previous notes, investigations, ordering medications, medical decision making, coordinating care and patient verbalized understanding at the end of the visit.     Charlott Rakes, MD, FAAFP. [facility] and [facility] [place], [place]   11/28/2019, 3:29 PM

## 2019-12-04 ENCOUNTER — Encounter: Payer: Self-pay | Admitting: Family Medicine

## 2019-12-07 ENCOUNTER — Other Ambulatory Visit: Payer: Medicaid Other

## 2019-12-08 ENCOUNTER — Other Ambulatory Visit: Payer: Medicaid Other

## 2019-12-08 ENCOUNTER — Other Ambulatory Visit: Payer: Self-pay | Admitting: Family Medicine

## 2019-12-08 DIAGNOSIS — K219 Gastro-esophageal reflux disease without esophagitis: Secondary | ICD-10-CM

## 2019-12-08 DIAGNOSIS — E039 Hypothyroidism, unspecified: Secondary | ICD-10-CM

## 2019-12-19 ENCOUNTER — Ambulatory Visit: Payer: Medicaid Other | Attending: Family | Admitting: Family

## 2019-12-19 ENCOUNTER — Other Ambulatory Visit: Payer: Self-pay

## 2019-12-19 DIAGNOSIS — J029 Acute pharyngitis, unspecified: Secondary | ICD-10-CM

## 2019-12-19 DIAGNOSIS — J069 Acute upper respiratory infection, unspecified: Secondary | ICD-10-CM | POA: Diagnosis not present

## 2019-12-19 MED ORDER — CEPACOL REGULAR STRENGTH 3 MG MT LOZG: 1.0000 | LOZENGE | OROMUCOSAL | 0 refills | Status: DC | PRN

## 2019-12-19 NOTE — Progress Notes (Signed)
Virtual Visit via Video Conference Note  I connected with Kimberly [name], on 12/19/2019 at 11:02 AM by video conference due to the COVID-19 pandemic and verified that I am speaking with the correct person using two identifiers.  Due to current restrictions/limitations of in-office visits due to the COVID-19 pandemic, this scheduled clinical appointment was converted to a telehealth visit.   Consent: I discussed the limitations, risks, security and privacy concerns of performing an evaluation and management service by telephone and the availability of in person appointments. I also discussed with the patient that there may be a patient responsible charge related to this service. The patient expressed understanding and agreed to proceed.  Location of Patient: Home  Location of Provider: [facility]  Persons participating in Telemedicine visit: [place], NP Orlan Leavens, [place]  History of Present Illness: Kimberly [name]. 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   1. RESPIRATORY SYMPTOMS:    11/28/2019: Visit with Dr. Margarita Rana. Patient with upper respiratory tract infection. Ordered nasal saline irrigation, Tylenol OTC as needed for headache or myalgia, reevaluation if she does not improve. Wok note provided. Prescribed Fluticasone and Cetirizine.   12/19/2019: Reports symptoms from earlier this month resolved. Current symptoms began 2 days ago. Initially had vomiting, diarrhea, sore throat, and nasal congestion. No diarrhea since yesterday around 5 pm. Diarrhea did not have blood or mucous. No vomiting since yesterday at 9 pm. Vomit appeared to be foamy acidic because her stomach was empty from not  eating and drinking as much. Since then has been able to keep down crackers and Sprite.   Still has nasal congestion and sore throat. Began taking nasal spray 2 days ago which helps some. Chief complaint is sore throat and using over-the-counter cough drops. Has not taken any Cetirizine as of yet.   Says she did have Covid earlier this year in July 2021. Says she does not feel the same as she did at that time stating this time she feels more like she has a stomach bug.  Her children, ages 63 years old and 40 months old as well as her mother, have all been sick with similar symptoms as well. Neither child attends school/daycare. 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   Cough: Yes []  No [x]  Shortness of breath: Yes []  No [x]  Fever: Yes []  No [x]  Runny nose: Yes []  No [x]  Nasal congestion: Yes [x]  No []  Decreased appetite: somewhat Trouble swallowing: Yes []  No [x]  Neck swelling/pain: Yes []  No [x]  Sore throat: Yes [x]  No []  Chest pain: Yes []  No [x]  Palpitations: Yes []   No [x]  Chills: Yes []  No [x]  Nausea: No [x]   Vomiting: No [x]   Abdominal pain: Yes []  No [x]  Body aches: Yes []  No [x]  Headaches: Yes []  No [x]  Over-the-counter medications: Yes [x]  cough drops   Past Medical History:  Diagnosis Date  . Acanthosis nigricans, acquired 03/01/2011  . Anxiety   . Asthma    "grew out of it" (11/16/2012)  . Chronic back pain   . Complication of anesthesia    "takes a lot to put me to sleep; woke up completely mid endoscopy" (11/16/2012)  . Depression   . Dysrhythmia    ST (11/16/2012)  . Gastric reflux   .  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 Iron deficiency anemia   . Migraines    "weekly" (11/16/2012)  . Oligomenorrhea 03/01/2011  . Ovarian cyst   . Pregnancy induced hypertension   . Schizophrenia ([place])    "moderate" (11/16/2012)  . Scoliosis    Allergies  Allergen Reactions  . Cold & Cough Daytime [Acetaminophen-Dm] Shortness Of Breath     Could not tolerate- orange liquid, may have contained Pseudoephedrine- "I could not tolerate"  . Sulfa Antibiotics Swelling  . Zofran [Ondansetron] Other (See Comments)    Muscle Spasms  . Abilify [Aripiprazole] Other (See Comments)    Lethargy, unable to function  . Amoxicillin-Pot Clavulanate Other (See Comments)    Stomach pains Has patient had a PCN reaction causing immediate rash, facial/tongue/throat swelling, SOB or lightheadedness with hypotension: No Has patient had a PCN reaction causing severe rash involving mucus membranes or skin necrosis: No Has patient had a PCN reaction that required hospitalization No Has patient had a PCN reaction occurring within the last 10 years: Yes If all of the above answers are "NO", then may proceed with Cephalosporin use.   . Cephalexin Other (See Comments)    Stomach pains Pt taking Cefadroxil as outpatient  . Latex Swelling and Other (See Comments)    Burning of skin, too  . Phenergan [Promethazine]     'gave me twitches' 'not able to sit still'  . Tape Other (See Comments)    Swelling, burning of skin .   Marland Kitchen Zicam Cold Remedy [Homeopathic Products] Nausea And Vomiting  . Keflex [Cephalexin] Rash and Other (See Comments)    Rash--able to take w/ Benadryl  . Vicodin [Hydrocodone-Acetaminophen] Hives and Rash    Per pt- makes her very sleepy, feels like she isn't getting her breath. States she does not have throat/tongue swelling or anaphylaxis.    Current Outpatient Medications on File Prior to Visit  Medication Sig Dispense Refill  . acetaminophen (TYLENOL) 325 MG tablet Take 650 mg by mouth every 6 (six) hours as needed. (Patient not taking: Reported on 09/15/2019)    . cetirizine (ZYRTEC) 10 MG tablet Take 1 tablet (10 mg total) by mouth daily. 30 tablet 1  . enalapril (VASOTEC) 5 MG tablet TAKE 1 TABLET(5 MG) BY MOUTH DAILY (Patient not taking: Reported on 10/24/2019) 30 tablet 0  . FLUoxetine (PROZAC) 20 MG capsule Take 1 capsule  (20 mg total) by mouth daily. 90 capsule 1  . fluticasone (FLONASE) 50 MCG/ACT nasal spray Place 2 sprays into both nostrils daily. 16 g 1  . hydrOXYzine (ATARAX/VISTARIL) 25 MG tablet Take 1 tablet (25 mg total) by mouth 3 (three) times daily as needed. 180 tablet 1  . levothyroxine (SYNTHROID) 75 MCG tablet Take 1 tablet (75 mcg total) by mouth daily before breakfast. 90 tablet 1  . meclizine (ANTIVERT) 25 MG tablet Take 1 tablet (25 mg total) by mouth 3 (three) times daily as needed for dizziness. 180 tablet 1  . norgestimate-ethinyl estradiol (ORTHO-CYCLEN) 0.25-35 MG-MCG tablet Take 1 tablet by mouth daily. (Patient not taking: Reported on 10/20/2019) 28 tablet 11  . omeprazole (PRILOSEC) 20 MG capsule Take 1 capsule (20 mg total) by mouth daily. 90 capsule 3  . oxyCODONE-acetaminophen (PERCOCET/ROXICET) 5-325 MG tablet Take 1 tablet by mouth every 6 (six) hours as needed for moderate pain. (Patient not taking: Reported on 10/24/2019) 10 tablet 0   No current facility-administered medications on file prior to visit.    Observations/Objective: Alert and oriented  x 3. Not in acute distress. Physical examination not completed as this is a telemedicine visit.  Assessment and Plan: 1. Upper respiratory tract infection, unspecified type: 2. Sore throat: - Continue Fluticasone, over-the-counter Tylenol, and Cetirizine as prescribed for respiratory symptoms.  - Begin Menthol-Cetylpyridinium lozenges for sore throat. - Note provided for work. - Follow-up in 1 week or sooner if needed if symptoms have not improved.  - menthol-cetylpyridinium (CEPACOL REGULAR STRENGTH) 3 MG lozenge; Take 1 lozenge (3 mg total) by mouth every 2 (two) hours as needed for sore throat.  Dispense: 30 tablet; Refill: 0  Follow Up Instructions: Follow-up in 1 week or sooner if needed if symptoms have not improved.   Patient was given clear instructions to go to Emergency Department or return to medical center if symptoms  don't improve, worsen, or new problems develop.The patient verbalized understanding.  I discussed the assessment and treatment plan with the patient. The patient was provided an opportunity to ask questions and all were answered. The patient agreed with the plan and demonstrated an understanding of the instructions.   The patient was advised to call back or seek an in-person evaluation if the symptoms worsen or if the condition fails to improve as anticipated.   I provided 20 minutes total of non-face-to-face time during this encounter including median intraservice time, reviewing previous notes, labs, imaging, medications, management and patient verbalized understanding.    Camillia Herter, NP  [facility] [place], [place]   12/19/2019, 8:05 AM

## 2019-12-19 NOTE — Patient Instructions (Signed)

## 2019-12-20 DIAGNOSIS — Z419 Encounter for procedure for purposes other than remedying health state, unspecified: Secondary | ICD-10-CM | POA: Diagnosis not present

## 2019-12-30 ENCOUNTER — Encounter: Payer: Self-pay | Admitting: Family Medicine

## 2020-01-01 ENCOUNTER — Other Ambulatory Visit: Payer: Self-pay | Admitting: Family Medicine

## 2020-01-01 MED ORDER — CLINDAMYCIN HCL 300 MG PO CAPS: 300.0000 mg | ORAL_CAPSULE | Freq: Two times a day (BID) | ORAL | 0 refills | Status: DC

## 2020-01-20 DIAGNOSIS — Z419 Encounter for procedure for purposes other than remedying health state, unspecified: Secondary | ICD-10-CM | POA: Diagnosis not present

## 2020-01-23 ENCOUNTER — Other Ambulatory Visit: Payer: Self-pay | Admitting: Family Medicine

## 2020-01-23 DIAGNOSIS — G8929 Other chronic pain: Secondary | ICD-10-CM

## 2020-01-25 ENCOUNTER — Encounter: Payer: Self-pay | Admitting: Family Medicine

## 2020-01-25 ENCOUNTER — Other Ambulatory Visit: Payer: Self-pay | Admitting: Family Medicine

## 2020-01-25 DIAGNOSIS — G8929 Other chronic pain: Secondary | ICD-10-CM

## 2020-01-29 ENCOUNTER — Other Ambulatory Visit: Payer: Self-pay | Admitting: Family Medicine

## 2020-01-29 ENCOUNTER — Encounter: Payer: Self-pay | Admitting: Family Medicine

## 2020-01-29 ENCOUNTER — Ambulatory Visit: Payer: Self-pay

## 2020-01-29 DIAGNOSIS — G8929 Other chronic pain: Secondary | ICD-10-CM | POA: Diagnosis not present

## 2020-01-29 DIAGNOSIS — R030 Elevated blood-pressure reading, without diagnosis of hypertension: Secondary | ICD-10-CM | POA: Diagnosis not present

## 2020-01-29 DIAGNOSIS — M545 Low back pain, unspecified: Secondary | ICD-10-CM | POA: Diagnosis not present

## 2020-01-29 DIAGNOSIS — J069 Acute upper respiratory infection, unspecified: Secondary | ICD-10-CM

## 2020-01-29 DIAGNOSIS — Z6841 Body Mass Index (BMI) 40.0 and over, adult: Secondary | ICD-10-CM | POA: Diagnosis not present

## 2020-01-29 MED ORDER — DOXYCYCLINE HYCLATE 100 MG PO TABS: 100.0000 mg | ORAL_TABLET | Freq: Two times a day (BID) | ORAL | 0 refills | Status: DC

## 2020-01-29 NOTE — Telephone Encounter (Signed)
Pt c/o golf ball sized, hard and swollen raised boil located to the left of umbilicus.  Starting 3 days ago, pt noted a pimple and popped it. There is redness around the "boil"  As well as heat.   Pt had a boil to her inner thigh 2 months ago and was tx with abx and warm compresses. No fever, rash. Pain is present when touched.  Care advice given and pt verbalized understanding. Appt schedule for tomorrow with PCP.  Reason for Disposition . Boil > 2 inches across (> 5 cm; larger than a golf ball or ping pong ball)  Additional Information . Commented on: All Negative - See Physician Within 24 Hours    Peri boil redness  Answer Assessment - Initial Assessment Questions 1. LOCATION: "Where does it hurt?"      Hurts left of umbilicus  2. RADIATION: "Does the pain shoot anywhere else?" (e.g., chest, back)     no 3. ONSET: "When did the pain begin?" (e.g., minutes, hours or days ago)      3 days ago 4. SUDDEN: "Gradual or sudden onset?"     Gradually pimple then grew and painful to touch 5. PATTERN "Does the pain come and go, or is it constant?"    - If constant: "Is it getting better, staying the same, or worsening?"      (Note: Constant means the pain never goes away completely; most serious pain is constant and it progresses)     - If intermittent: "How long does it last?" "Do you have pain now?"     (Note: Intermittent means the pain goes away completely between bouts)     Hurts to touch- sore but not severe is not touched 6. SEVERITY: "How bad is the pain?"  (e.g., Scale 1-10; mild, moderate, or severe)   - MILD (1-3): doesn't interfere with normal activities, abdomen soft and not tender to touch    - MODERATE (4-7): interferes with normal activities or awakens from sleep, tender to touch    - SEVERE (8-10): excruciating pain, doubled over, unable to do any normal activities      When touched moderate 7. RECURRENT SYMPTOM: "Have you ever had this type of stomach pain before?" If Yes,  ask: "When was the last time?" and "What happened that time?"      No- 8. CAUSE: "What do you think is causing the stomach pain?"     Boil or abscess  9. RELIEVING/AGGRAVATING FACTORS: "What makes it better or worse?" (e.g., movement, antacids, bowel movement)     Touching it 10. OTHER SYMPTOMS: "Has there been any vomiting, diarrhea, constipation, or urine problems?"       no 11. PREGNANCY: "Is there any chance you are pregnant?" "When was your last menstrual period?"      No- LMP 01/10/20  Answer Assessment - Initial Assessment Questions 1. APPEARANCE of BOIL: "What does the boil look like?"      Swollen red  2. LOCATION: "Where is the boil located?"      Left if umbilicus 3. NUMBER: "How many boils are there?"      1 4. SIZE: "How big is the boil?" (e.g., inches, cm; compare to size of a coin or other object)     Golf ball 5. ONSET: "When did the boil start?"     Saturday 6. PAIN: "Is there any pain?" If Yes, ask: "How bad is the pain?"   (Scale 1-10; or mild, moderate, severe)    Moderate-  with touch ache when not touched 7. FEVER: "Do you have a fever?" If Yes, ask: "What is it, how was it measured, and when did it start?"      no 8. SOURCE: "Have you been around anyone with boils or other Staph infections?" "Have you ever had boils before?"     No- thigh boil - was prescribed abx and warm compresses- 2 months 9. OTHER SYMPTOMS: "Do you have any other symptoms?" (e.g., shaking chills, weakness, rash elsewhere on body)     no 10. PREGNANCY: "Is there any chance you are pregnant?" "When was your last menstrual period?"       No/12/21  Protocols used: BOIL (SKIN ABSCESS)-A-AH, ABDOMINAL PAIN - [facility]

## 2020-01-30 ENCOUNTER — Ambulatory Visit: Payer: Medicaid Other | Attending: Family Medicine | Admitting: Family Medicine

## 2020-01-30 ENCOUNTER — Encounter: Payer: Self-pay | Admitting: Family Medicine

## 2020-01-30 ENCOUNTER — Other Ambulatory Visit: Payer: Self-pay

## 2020-01-30 DIAGNOSIS — E039 Hypothyroidism, unspecified: Secondary | ICD-10-CM

## 2020-01-30 DIAGNOSIS — L0292 Furuncle, unspecified: Secondary | ICD-10-CM

## 2020-01-30 DIAGNOSIS — M545 Low back pain, unspecified: Secondary | ICD-10-CM

## 2020-01-30 DIAGNOSIS — G8929 Other chronic pain: Secondary | ICD-10-CM

## 2020-01-30 MED ORDER — METHOCARBAMOL 500 MG PO TABS: 500.0000 mg | ORAL_TABLET | Freq: Three times a day (TID) | ORAL | 1 refills | Status: DC | PRN

## 2020-01-30 NOTE — Progress Notes (Signed)
Virtual Visit via Telephone Note  I connected with Kimberly [name], on 01/30/2020 at 3:46 PM by telephone due to the COVID-19 pandemic and verified that I am speaking with the correct person using two identifiers.   Consent: I discussed the limitations, risks, security and privacy concerns of performing an evaluation and management service by telephone and the availability of in person appointments. I also discussed with the patient that there may be a patient responsible charge related to this service. The patient expressed understanding and agreed to proceed.   Location of Patient: Home  Location of Provider: Clinic   Persons participating in Telemedicine visit: Kimberly [name]-CMA Dr. Margarita Rana     History of Present Illness:  She is a 30 year old female with history of anxiety and depression, hypothyroidism, chronic low back pain, history of Covid in 07/2019 who presents today for an acute visit. She has had a boil for the last couple of days and states she was concerned the boil might pop. The boil is draining and she applied heat compress with some reduction in size. I had sent her a prescription for Doxycycline to her Pharmacy She has a low grade fever and has been doing Tylenol but has no malaise or upper respiratory symptoms.  She saw the Spine specialist for her back pain and he recommended muscle relaxants but did not prescribe any.  Past Medical History:  Diagnosis Date  . Acanthosis nigricans, acquired 03/01/2011  . Anxiety   . Asthma    "grew out of it" (11/16/2012)  . Chronic back pain   . Complication of anesthesia    "takes a lot to put me to sleep; woke up completely mid endoscopy" (11/16/2012)  . Depression   . Dysrhythmia    ST (11/16/2012)  . Gastric reflux   . 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 Iron deficiency anemia   . Migraines    "weekly" (11/16/2012)  . Oligomenorrhea 03/01/2011  . Ovarian cyst   . Pregnancy  induced hypertension   . Schizophrenia ([place])    "moderate" (11/16/2012)  . Scoliosis    Allergies  Allergen Reactions  . Cold & Cough Daytime [Acetaminophen-Dm] Shortness Of Breath    Could not tolerate- orange liquid, may have contained Pseudoephedrine- "I could not tolerate"  . Sulfa Antibiotics Swelling  . Zofran [Ondansetron] Other (See Comments)    Muscle Spasms  . Abilify [Aripiprazole] Other (See Comments)    Lethargy, unable to function  . Amoxicillin-Pot Clavulanate Other (See Comments)    Stomach pains Has patient had a PCN reaction causing immediate rash, facial/tongue/throat swelling, SOB or lightheadedness with hypotension: No Has patient had a PCN reaction causing severe rash involving mucus membranes or skin necrosis: No Has patient had a PCN reaction that required hospitalization No Has patient had a PCN reaction occurring within the last 10 years: Yes If all of the above answers are "NO", then may proceed with Cephalosporin use.   . Cephalexin Other (See Comments)    Stomach pains Pt taking Cefadroxil as outpatient  . Latex Swelling and Other (See Comments)    Burning of skin, too  . Phenergan [Promethazine]     'gave me twitches' 'not able to sit still'  . Tape Other (See Comments)    Swelling, burning of skin .   Marland Kitchen Zicam Cold Remedy [Homeopathic Products] Nausea And Vomiting  . Keflex [Cephalexin] Rash and Other (See Comments)    Rash--able to take w/ Benadryl  . Vicodin [  Hydrocodone-Acetaminophen] Hives and Rash    Per pt- makes her very sleepy, feels like she isn't getting her breath. States she does not have throat/tongue swelling or anaphylaxis.    Current Outpatient Medications on File Prior to Visit  Medication Sig Dispense Refill  . cetirizine (ZYRTEC) 10 MG tablet TAKE 1 TABLET(10 MG) BY MOUTH DAILY 30 tablet 1  . FLUoxetine (PROZAC) 20 MG capsule Take 1 capsule (20 mg total) by mouth daily. 90 capsule 1  . hydrOXYzine (ATARAX/VISTARIL) 25 MG  tablet Take 1 tablet (25 mg total) by mouth 3 (three) times daily as needed. 180 tablet 1  . levothyroxine (SYNTHROID) 75 MCG tablet Take 1 tablet (75 mcg total) by mouth daily before breakfast. 90 tablet 1  . meclizine (ANTIVERT) 25 MG tablet Take 1 tablet (25 mg total) by mouth 3 (three) times daily as needed for dizziness. 180 tablet 1  . menthol-cetylpyridinium (CEPACOL REGULAR STRENGTH) 3 MG lozenge Take 1 lozenge (3 mg total) by mouth every 2 (two) hours as needed for sore throat. 30 tablet 0  . omeprazole (PRILOSEC) 20 MG capsule Take 1 capsule (20 mg total) by mouth daily. 90 capsule 3  . acetaminophen (TYLENOL) 325 MG tablet Take 650 mg by mouth every 6 (six) hours as needed. (Patient not taking: No sig reported)    . clindamycin (CLEOCIN) 300 MG capsule Take 1 capsule (300 mg total) by mouth in the morning and at bedtime. (Patient not taking: Reported on 01/30/2020) 20 capsule 0  . doxycycline (VIBRA-TABS) 100 MG tablet Take 1 tablet (100 mg total) by mouth 2 (two) times daily. (Patient not taking: Reported on 01/30/2020) 20 tablet 0  . enalapril (VASOTEC) 5 MG tablet TAKE 1 TABLET(5 MG) BY MOUTH DAILY (Patient not taking: No sig reported) 30 tablet 0  . fluticasone (FLONASE) 50 MCG/ACT nasal spray Place 2 sprays into both nostrils daily. (Patient not taking: Reported on 01/30/2020) 16 g 1  . norgestimate-ethinyl estradiol (ORTHO-CYCLEN) 0.25-35 MG-MCG tablet Take 1 tablet by mouth daily. (Patient not taking: No sig reported) 28 tablet 11  . oxyCODONE-acetaminophen (PERCOCET/ROXICET) 5-325 MG tablet Take 1 tablet by mouth every 6 (six) hours as needed for moderate pain. (Patient not taking: No sig reported) 10 tablet 0   No current facility-administered medications on file prior to visit.    Observations/Objective: Awake, alert, oriented x3 Not in acute distress     Assessment and Plan: 1. Furuncle Prescribed Doxycycline which she just picked up today Advised to apply warm compress  and gauze dressing Discussed symptoms/signs that would warrant UC/ED visit  2. Hypothyroidism, unspecified type Suppressed TSH GYN has been managing this during her pregnancy Will repeat level and adjust accordingly - Basic Metabolic Panel; Future - T4, free; Future - TSH; Future  3. Chronic bilateral low back pain without sciatica Uncontrolled Referred for PT Recently seen by spine specialist and muscle relaxant recommended but non prescribed I have discussed multiple modalities of treatment for back pain with her and she is not keen on taking medications but would like to try the muscle relaxant - methocarbamol (ROBAXIN) 500 MG tablet; Take 1 tablet (500 mg total) by mouth every 8 (eight) hours as needed for muscle spasms.  Dispense: 60 tablet; Refill: 1   Follow Up Instructions: Keep previously schedule appointment   I discussed the assessment and treatment plan with the patient. The patient was provided an opportunity to ask questions and all were answered. The patient agreed with the plan and demonstrated an understanding  of the instructions.   The patient was advised to call back or seek an in-person evaluation if the symptoms worsen or if the condition fails to improve as anticipated.     I provided 16 minutes total of non-face-to-face time during this encounter including median intraservice time, reviewing previous notes, investigations, ordering medications, medical decision making, coordinating care and patient verbalized understanding at the end of the visit.     Charlott Rakes, MD, FAAFP. [facility] and [place], [place]   01/30/2020, 3:46 PM

## 2020-01-30 NOTE — Progress Notes (Signed)
Has a boil on stomach

## 2020-02-02 ENCOUNTER — Other Ambulatory Visit: Payer: Medicaid Other

## 2020-02-14 ENCOUNTER — Other Ambulatory Visit: Payer: Medicaid Other

## 2020-02-14 DIAGNOSIS — Z20822 Contact with and (suspected) exposure to covid-19: Secondary | ICD-10-CM

## 2020-02-15 LAB — NOVEL CORONAVIRUS, NAA: SARS-CoV-2, NAA: NOT DETECTED

## 2020-02-15 LAB — SARS-COV-2, NAA 2 DAY TAT

## 2020-02-20 DIAGNOSIS — Z419 Encounter for procedure for purposes other than remedying health state, unspecified: Secondary | ICD-10-CM | POA: Diagnosis not present

## 2020-02-22 ENCOUNTER — Ambulatory Visit: Payer: Medicaid Other | Admitting: Internal Medicine

## 2020-02-22 NOTE — Progress Notes (Deleted)
Patient ID: Kimberly [name], female   DOB: 02-27-1990, 30 y.o.   MRN: [id number]   This visit occurred during the SARS-CoV-2 public health emergency.  Safety protocols were in place, including screening questions prior to the visit, additional usage of staff PPE, and extensive cleaning of exam room while observing appropriate contact time as indicated for disinfecting solutions.   HPI  Kimberly [name] is a 30 y.o.-year-old female, referred by her PCP, Nugent, Gerrie Nordmann, NP, for management of uncontrolled hypothyroidism.  Pt. has been dx with hypothyroidism in *** >> on Levothyroxine 75 mcg.  Last dose change 10/2019.  She takes the thyroid hormone: - in am - fasting - at least 30 min from b'fast - no calcium - no iron - no multivitamins - + PPIs - not on Biotin  I reviewed pt's thyroid tests: Lab Results  Component Value Date   TSH 0.013 (L) 10/20/2019   TSH 5.593 (H) 08/01/2019   TSH 0.195 (L) 05/17/2019   TSH 2.990 01/18/2019   TSH 1.180 07/26/2018   TSH 16.010 (H) 03/17/2018   TSH 16.920 (H) 12/15/2016   TSH 0.860 06/08/2016   TSH 15.70 (H) 11/01/2015   TSH 10.51 (H) 07/24/2015   FREET4 0.81 08/01/2019   FREET4 1.25 05/17/2019   FREET4 1.52 07/26/2018   FREET4 0.77 (L) 03/17/2018   FREET4 0.9 01/16/2016   FREET4 1.1 07/24/2015   FREET4 1.0 05/30/2015   FREET4 1.03 01/23/2015   FREET4 1.13 08/17/2014   FREET4 1.10 02/25/2012   T3FREE 3.6 03/17/2018   T3FREE 3.1 01/16/2016   T3FREE 3.1 04/24/2011   T3FREE 3.0 02/20/2011   T3FREE 4.0 10/28/2010   T3FREE 3.0 07/21/2010   Antithyroid antibodies: No results found for: THGAB No components found for: TPOAB  Pt describes: - weight gain - fatigue - cold intolerance - depression - constipation - dry skin - hair loss  Pt denies feeling nodules in neck, hoarseness, dysphagia/odynophagia, SOB with lying down.  She has + FH of thyroid disorders in: ***. No FH of thyroid cancer.  No h/o radiation tx to head or neck. No  recent use of iodine supplements.  Pt. also has a history of ovarian cysts, IDA, migraines, anxiety/depression, schizophrenia.  ROS: Constitutional: no weight gain/loss, no fatigue, no subjective hyperthermia/hypothermia Eyes: no blurry vision, no xerophthalmia ENT: no sore throat, no nodules palpated in throat, no dysphagia/odynophagia, no hoarseness Cardiovascular: no CP/SOB/palpitations/leg swelling Respiratory: no cough/SOB Gastrointestinal: no N/V/D/C Musculoskeletal: no muscle/joint aches Skin: no rashes Neurological: no tremors/numbness/tingling/dizziness Psychiatric: no depression/anxiety  PE: There were no vitals taken for this visit. Wt Readings from Last 3 Encounters:  10/24/19 227 lb (103 kg)  10/20/19 229 lb 6.4 oz (104.1 kg)  09/15/19 225 lb (102.1 kg)   Constitutional: overweight, in NAD Eyes: PERRLA, EOMI, no exophthalmos ENT: moist mucous membranes, no thyromegaly, no cervical lymphadenopathy Cardiovascular: RRR, No MRG Respiratory: CTA B Gastrointestinal: abdomen soft, NT, ND, BS+ Musculoskeletal: no deformities, strength intact in all 4 Skin: moist, warm, no rashes Neurological: no tremor with outstretched hands, DTR normal in all 4  ASSESSMENT: 1. Hypothyroidism -Uncontrolled  PLAN:  1. Patient with long-standing hypothyroidism, on levothyroxine therapy.  We reviewed together her TFTs and they have been very fluctuating over the years, with the latest TSH being very suppressed, at 0.013 10/2019.  At that time, her levothyroxine dose was decreased from 100 to 75 mcg daily. - she appears euthyroid.  - she does not appear to have a goiter, thyroid nodules, or  neck compression symptoms - We discussed about correct intake of levothyroxine, fasting, with water, separated by at least 30 minutes from breakfast, and separated by more than 4 hours from calcium, iron, multivitamins, acid reflux medications (PPIs). - will check thyroid tests today: TSH, free T4 - If  labs today are abnormal, she will need to return in ~6 weeks for repeat labs - Otherwise, I will see her back in 4 months  Philemon Kingdom, MD PhD [facility]

## 2020-03-18 ENCOUNTER — Encounter: Payer: Self-pay | Admitting: Family Medicine

## 2020-03-18 ENCOUNTER — Other Ambulatory Visit: Payer: Self-pay | Admitting: Family Medicine

## 2020-03-18 MED ORDER — DOXYCYCLINE HYCLATE 100 MG PO TABS
100.0000 mg | ORAL_TABLET | Freq: Two times a day (BID) | ORAL | 0 refills | Status: DC
Start: 2020-03-18 — End: 2020-08-28

## 2020-03-19 DIAGNOSIS — Z419 Encounter for procedure for purposes other than remedying health state, unspecified: Secondary | ICD-10-CM | POA: Diagnosis not present

## 2020-04-19 DIAGNOSIS — Z419 Encounter for procedure for purposes other than remedying health state, unspecified: Secondary | ICD-10-CM | POA: Diagnosis not present

## 2020-04-23 ENCOUNTER — Encounter: Payer: Self-pay | Admitting: Family Medicine

## 2020-04-24 ENCOUNTER — Other Ambulatory Visit: Payer: Self-pay | Admitting: Family Medicine

## 2020-04-24 DIAGNOSIS — G8929 Other chronic pain: Secondary | ICD-10-CM

## 2020-04-24 DIAGNOSIS — M545 Low back pain, unspecified: Secondary | ICD-10-CM

## 2020-04-25 ENCOUNTER — Ambulatory Visit: Payer: Medicaid Other | Admitting: Family Medicine

## 2020-04-25 ENCOUNTER — Ambulatory Visit: Payer: Medicaid Other | Admitting: Internal Medicine

## 2020-04-25 NOTE — Progress Notes (Deleted)
Patient ID: Kimberly [name], female   DOB: 11-08-1990, 30 y.o.   MRN: [id number]   This visit occurred during the SARS-CoV-2 public health emergency.  Safety protocols were in place, including screening questions prior to the visit, additional usage of staff PPE, and extensive cleaning of exam room while observing appropriate contact time as indicated for disinfecting solutions.   HPI  Kimberly [name] is a 30 y.o.-year-old female, referred by her PCP, Dr. Morey Hummingbird for management of acquired hypothyroidism.  Pt. has been dx with hypothyroidism in *** >> on Levothyroxine 75 mcg (dose decreased 10/2019).  She takes the thyroid hormone: - fasting - with water - separated by >30 min from b'fast  - no calcium, iron, multivitamins  - + Protonix  I reviewed pt's thyroid tests: Lab Results  Component Value Date   TSH 0.013 (L) 10/20/2019   TSH 5.593 (H) 08/01/2019   TSH 0.195 (L) 05/17/2019   TSH 2.990 01/18/2019   TSH 1.180 07/26/2018   TSH 16.010 (H) 03/17/2018   TSH 16.920 (H) 12/15/2016   TSH 0.860 06/08/2016   TSH 15.70 (H) 11/01/2015   TSH 10.51 (H) 07/24/2015   FREET4 0.81 08/01/2019   FREET4 1.25 05/17/2019   FREET4 1.52 07/26/2018   FREET4 0.77 (L) 03/17/2018   FREET4 0.9 01/16/2016   FREET4 1.1 07/24/2015   FREET4 1.0 05/30/2015   FREET4 1.03 01/23/2015   FREET4 1.13 08/17/2014   FREET4 1.10 02/25/2012   T3FREE 3.6 03/17/2018   T3FREE 3.1 01/16/2016   T3FREE 3.1 04/24/2011   T3FREE 3.0 02/20/2011   T3FREE 4.0 10/28/2010   T3FREE 3.0 07/21/2010   Antithyroid antibodies: 07/21/2010: TPO antibodies <10 (<35) No results found for: THGAB No components found for: TPOAB  Pt describes: - weight gain - fatigue - cold intolerance - depression - constipation - dry skin - hair loss  Pt denies feeling nodules in neck, hoarseness, dysphagia/odynophagia, SOB with lying down.  She had a chest CT + CTA (03/17/2018): Unremarkable thyroid  She has + FH of thyroid disorders in:  ***. No FH of thyroid cancer.  No h/o radiation tx to head or neck. No recent use of iodine supplements.  Pt. also has a history of mildly abnormal OGTT 05/17/2019 (CBGs 91, 181, 122). Also, reviewing her chart, in 2009 she had a normal FSH, LH, ACTH, cortisol. She also has a history of ovarian cyst, oligomenorrhea, migraines, chronic low back pain, furunculosis, iron deficiency anemia, GERD, anxiety/depression, moderate schizophrenia  ROS: Constitutional: no weight gain/loss, no fatigue, no subjective hyperthermia/hypothermia Eyes: no blurry vision, no xerophthalmia ENT: no sore throat, no nodules palpated in throat, no dysphagia/odynophagia, no hoarseness Cardiovascular: no CP/SOB/palpitations/leg swelling Respiratory: no cough/SOB Gastrointestinal: no N/V/D/C Musculoskeletal: no muscle/joint aches Skin: no rashes Neurological: no tremors/numbness/tingling/dizziness Psychiatric: no depression/anxiety  PE: There were no vitals taken for this visit. Wt Readings from Last 3 Encounters:  10/24/19 227 lb (103 kg)  10/20/19 229 lb 6.4 oz (104.1 kg)  09/15/19 225 lb (102.1 kg)   Constitutional: overweight, in NAD Eyes: PERRLA, EOMI, no exophthalmos ENT: moist mucous membranes, no thyromegaly, no cervical lymphadenopathy Cardiovascular: RRR, No MRG Respiratory: CTA B Gastrointestinal: abdomen soft, NT, ND, BS+ Musculoskeletal: no deformities, strength intact in all 4 Skin: moist, warm, no rashes Neurological: no tremor with outstretched hands, DTR normal in all 4  ASSESSMENT: 1.  Acquired hypothyroidism  PLAN:  1. Patient with long-standing uncontrolled hypothyroidism, on levothyroxine therapy. - latest thyroid labs reviewed with pt >> suppressed TSH: Lab  Results  Component Value Date   TSH 0.013 (L) 10/20/2019  - she continues on LT4 75 mcg daily (dose decreased 10/2019) - pt feels good on this dose. - we discussed about taking the thyroid hormone every day, with water, >30  minutes before breakfast, separated by >4 hours from acid reflux medications, calcium, iron, multivitamins. Pt. is taking it correctly. - will check thyroid tests today: TSH and fT4 - If labs are abnormal, she will need to return for repeat TFTs in 1.5 months - OTW, I will see her back in 3-4 months, but likely sooner for labs  Kimberly Kingdom, MD PhD [facility] Endocrinology

## 2020-04-29 ENCOUNTER — Encounter: Payer: Self-pay | Admitting: Physical Medicine and Rehabilitation

## 2020-04-29 DIAGNOSIS — L02211 Cutaneous abscess of abdominal wall: Secondary | ICD-10-CM | POA: Diagnosis not present

## 2020-05-19 DIAGNOSIS — Z419 Encounter for procedure for purposes other than remedying health state, unspecified: Secondary | ICD-10-CM | POA: Diagnosis not present

## 2020-05-24 ENCOUNTER — Other Ambulatory Visit: Payer: Self-pay | Admitting: Family Medicine

## 2020-05-24 DIAGNOSIS — E039 Hypothyroidism, unspecified: Secondary | ICD-10-CM

## 2020-06-14 ENCOUNTER — Ambulatory Visit: Payer: Medicaid Other | Admitting: Internal Medicine

## 2020-06-14 NOTE — Progress Notes (Deleted)
Patient ID: Kimberly [name], female   DOB: 10-Aug-1990, 30 y.o.   MRN: [id number]   This visit occurred during the SARS-CoV-2 public health emergency.  Safety protocols were in place, including screening questions prior to the visit, additional usage of staff PPE, and extensive cleaning of exam room while observing appropriate contact time as indicated for disinfecting solutions.    HPI  Kimberly [name] is a 30 y.o.-year-old female, referred by her PCP, Dr. Margarita Rana, for management of uncontrolled hypothyroidism.  Pt. has been dx with hypothyroidism in *** >> on Levothyroxine 75 mcg.  She takes the thyroid hormone: - in am - fasting - at least 30 min from b'fast - no calcium - no iron - no multivitamins - + PPIs (omeprazole 20 mg daily) - not on Biotin  I reviewed pt's thyroid tests: Lab Results  Component Value Date   TSH 0.013 (L) 10/20/2019   TSH 5.593 (H) 08/01/2019   TSH 0.195 (L) 05/17/2019   TSH 2.990 01/18/2019   TSH 1.180 07/26/2018   TSH 16.010 (H) 03/17/2018   TSH 16.920 (H) 12/15/2016   TSH 0.860 06/08/2016   TSH 15.70 (H) 11/01/2015   TSH 10.51 (H) 07/24/2015   FREET4 0.81 08/01/2019   FREET4 1.25 05/17/2019   FREET4 1.52 07/26/2018   FREET4 0.77 (L) 03/17/2018   FREET4 0.9 01/16/2016   FREET4 1.1 07/24/2015   FREET4 1.0 05/30/2015   FREET4 1.03 01/23/2015   FREET4 1.13 08/17/2014   FREET4 1.10 02/25/2012   T3FREE 3.6 03/17/2018   T3FREE 3.1 01/16/2016   T3FREE 3.1 04/24/2011   T3FREE 3.0 02/20/2011   T3FREE 4.0 10/28/2010   T3FREE 3.0 07/21/2010   Antithyroid antibodies: No results found for: THGAB No components found for: TPOAB  Pt describes: - weight gain - fatigue - cold intolerance - depression - constipation - dry skin - hair loss  Pt denies feeling nodules in neck, hoarseness, dysphagia/odynophagia, SOB with lying down.  She has + FH of thyroid disorders in: ***. No FH of thyroid cancer.  No h/o radiation tx to head or neck. No recent  use of iodine supplements.  Pt. also has a history of ***.  ROS: Constitutional: no weight gain/loss, no fatigue, no subjective hyperthermia/hypothermia Eyes: no blurry vision, no xerophthalmia ENT: no sore throat, no nodules palpated in throat, no dysphagia/odynophagia, no hoarseness Cardiovascular: no CP/SOB/palpitations/leg swelling Respiratory: no cough/SOB Gastrointestinal: no N/V/D/C Musculoskeletal: no muscle/joint aches Skin: no rashes Neurological: no tremors/numbness/tingling/dizziness Psychiatric: no depression/anxiety  PE: There were no vitals taken for this visit. Wt Readings from Last 3 Encounters:  10/24/19 227 lb (103 kg)  10/20/19 229 lb 6.4 oz (104.1 kg)  09/15/19 225 lb (102.1 kg)   Constitutional: overweight, in NAD Eyes: PERRLA, EOMI, no exophthalmos ENT: moist mucous membranes, no thyromegaly, no cervical lymphadenopathy Cardiovascular: RRR, No MRG Respiratory: CTA B Gastrointestinal: abdomen soft, NT, ND, BS+ Musculoskeletal: no deformities, strength intact in all 4 Skin: moist, warm, no rashes Neurological: no tremor with outstretched hands, DTR normal in all 4  ASSESSMENT: 1. Hypothyroidism  PLAN:  1. Patient with long-standing hypothyroidism, on levothyroxine therapy.  Her TFTs have been very fluctuating over the years. - latest thyroid labs reviewed with pt. >> TSH was suppressed: Lab Results  Component Value Date   TSH 0.013 (L) 10/20/2019   - she continues on LT4 75 mcg daily, dose decreased after the above results returned - pt feels good on this dose. - we discussed about taking the thyroid hormone  every day, with water, >30 minutes before breakfast, separated by >4 hours from acid reflux medications, calcium, iron, multivitamins. Pt. is taking it correctly. - will check thyroid tests today: TSH and fT4 - If labs are abnormal, she will need to return for repeat TFTs in 1.5 months -OTW, I will see her back in 4 months  Philemon Kingdom,  MD PhD [facility] Endocrinology

## 2020-06-19 DIAGNOSIS — Z419 Encounter for procedure for purposes other than remedying health state, unspecified: Secondary | ICD-10-CM | POA: Diagnosis not present

## 2020-06-24 ENCOUNTER — Other Ambulatory Visit: Payer: Self-pay | Admitting: Family Medicine

## 2020-06-24 DIAGNOSIS — F32A Depression, unspecified: Secondary | ICD-10-CM

## 2020-06-24 DIAGNOSIS — R42 Dizziness and giddiness: Secondary | ICD-10-CM

## 2020-06-24 DIAGNOSIS — F419 Anxiety disorder, unspecified: Secondary | ICD-10-CM

## 2020-06-24 DIAGNOSIS — E039 Hypothyroidism, unspecified: Secondary | ICD-10-CM

## 2020-06-24 MED ORDER — LEVOTHYROXINE SODIUM 75 MCG PO TABS: 75.0000 ug | ORAL_TABLET | Freq: Every day | ORAL | 1 refills | Status: DC

## 2020-06-24 MED ORDER — HYDROXYZINE HCL 25 MG PO TABS: 25.0000 mg | ORAL_TABLET | Freq: Three times a day (TID) | ORAL | 0 refills | Status: DC | PRN

## 2020-06-24 MED ORDER — FLUOXETINE HCL 20 MG PO CAPS: 20.0000 mg | ORAL_CAPSULE | Freq: Every day | ORAL | 0 refills | Status: DC

## 2020-06-24 NOTE — Telephone Encounter (Signed)
Copied from [place] [id number]. Topic: Quick Communication - Rx Refill/Question >> Jun 24, 2020  1:24 PM Leward Quan A wrote: Medication: FLUoxetine (PROZAC) 20 MG capsule, levothyroxine (SYNTHROID) 75 MCG tablet, meclizine (ANTIVERT) 25 MG tablet, omeprazole (PRILOSEC) 20 MG capsule   Has the patient contacted their pharmacy? Yes.   (Agent: If no, request that the patient contact the pharmacy for the refill.) (Agent: If yes, when and what did the pharmacy advise?)  Preferred Pharmacy (with phone number or street name): [facility] [place], [place]  Phone:  [phone] Fax:  [phone]     Agent: Please be advised that RX refills may take up to 3 business days. We ask that you follow-up with your pharmacy.

## 2020-06-24 NOTE — Telephone Encounter (Signed)
Requested medication (s) are due for refill today: yes   Requested medication (s) are on the active medication list: yes   Last refill:  10/24/2019  Future visit scheduled:yes   Notes to clinic:  this refill cannot be delegated    Requested Prescriptions  Pending Prescriptions Disp Refills   meclizine (ANTIVERT) 25 MG tablet 180 tablet 1    Sig: Take 1 tablet (25 mg total) by mouth 3 (three) times daily as needed for dizziness.      Not Delegated - Gastroenterology: Antiemetics Failed - 06/24/2020  1:38 PM      Failed - This refill cannot be delegated      Passed - Valid encounter within last 6 months    Recent Outpatient Visits           4 months ago [place], [place], MD   6 months ago Upper respiratory tract infection, unspecified type   [place], [place], NP   6 months ago Upper respiratory tract infection, unspecified type   [place], En[place]ong, MD   8 months ago Hypothyroidism, unspecified type   [place], [place], MD   1 year ago First trimester pregnancy   [place], Enobong, MD       Future Appointments             In 3 days Raulkar, Clide Deutscher, MD [place] [facility], [facility]   In 1 month Gildardo Pounds, NP [place]              Signed Prescriptions Disp Refills   hydrOXYzine (ATARAX/VISTARIL) 25 MG tablet 180 tablet 0    Sig: Take 1 tablet (25 mg total) by mouth 3 (three) times daily as needed.      Ear, Nose, and Throat:  Antihistamines Passed - 06/24/2020  1:38 PM      Passed - Valid encounter within last 12 months    Recent Outpatient Visits           4 months ago [place], Enobong, MD   6 months ago Upper respiratory tract infection,  unspecified type   [place], [place], NP   6 months ago Upper respiratory tract infection, unspecified type   [place], Enobong, MD   8 months ago Hypothyroidism, unspecified type   [place], En[place]ong, MD   1 year ago First trimester pregnancy   [place], Enobong, MD       Future Appointments             In 3 days Raulkar, Clide Deutscher, MD [place] [facility], [facility]   In 1 month Gildardo Pounds, NP [place]               FLUoxetine (PROZAC) 20 MG capsule 90 capsule 0    Sig: Take 1 capsule (20 mg total) by mouth daily.      Psychiatry:  Antidepressants - SSRI Passed - 06/24/2020  1:38 PM      Passed - Completed PHQ-2 or PHQ-9 in the last 360 days      Passed - Valid  encounter within last 6 months    Recent Outpatient Visits           4 months ago [place], [place], MD   6 months ago Upper respiratory tract infection, unspecified type   [place], [place], NP   6 months ago Upper respiratory tract infection, unspecified type   [place], [place]ong, MD   8 months ago Hypothyroidism, unspecified type   [place], En[place]ong, MD   1 year ago First trimester pregnancy   [place], Enobong, MD       Future Appointments             In 3 days Raulkar, Clide Deutscher, MD [place] [facility], [facility]   In 1 month Gildardo Pounds, NP [place]               levothyroxine (SYNTHROID) 75 MCG tablet 90 tablet 1    Sig: Take 1 tablet (75 mcg total) by mouth daily before breakfast.      Endocrinology:   Hypothyroid Agents Failed - 06/24/2020  1:38 PM      Failed - TSH needs to be rechecked within 3 months after an abnormal result. Refill until TSH is due.      Failed - TSH in normal range and within 360 days    TSH  Date Value Ref Range Status  10/20/2019 0.013 (L) 0.450 - 4.500 uIU/mL Final          Passed - Valid encounter within last 12 months    Recent Outpatient Visits           4 months ago [place], Enobong, MD   6 months ago Upper respiratory tract infection, unspecified type   [place], [place], NP   6 months ago Upper respiratory tract infection, unspecified type   [place], Enobong, MD   8 months ago Hypothyroidism, unspecified type   [place], Enobong, MD   1 year ago First trimester pregnancy   [place], Enobong, MD       Future Appointments             In 3 days Raulkar, Clide Deutscher, MD [place] [facility], [facility]   In 1 month Gildardo Pounds, NP [place]

## 2020-06-25 MED ORDER — MECLIZINE HCL 25 MG PO TABS: 25.0000 mg | ORAL_TABLET | Freq: Three times a day (TID) | ORAL | 0 refills | Status: DC | PRN

## 2020-06-27 ENCOUNTER — Encounter: Payer: Medicaid Other | Admitting: Physical Medicine and Rehabilitation

## 2020-07-13 ENCOUNTER — Encounter: Payer: Self-pay | Admitting: Family Medicine

## 2020-07-15 ENCOUNTER — Other Ambulatory Visit: Payer: Self-pay | Admitting: Family Medicine

## 2020-07-15 DIAGNOSIS — N949 Unspecified condition associated with female genital organs and menstrual cycle: Secondary | ICD-10-CM

## 2020-07-19 DIAGNOSIS — Z419 Encounter for procedure for purposes other than remedying health state, unspecified: Secondary | ICD-10-CM | POA: Diagnosis not present

## 2020-07-31 ENCOUNTER — Encounter: Payer: Self-pay | Admitting: Family Medicine

## 2020-08-04 ENCOUNTER — Encounter: Payer: Self-pay | Admitting: Family Medicine

## 2020-08-04 DIAGNOSIS — B356 Tinea cruris: Secondary | ICD-10-CM | POA: Diagnosis not present

## 2020-08-04 DIAGNOSIS — L089 Local infection of the skin and subcutaneous tissue, unspecified: Secondary | ICD-10-CM | POA: Diagnosis not present

## 2020-08-04 DIAGNOSIS — B354 Tinea corporis: Secondary | ICD-10-CM | POA: Diagnosis not present

## 2020-08-04 DIAGNOSIS — B958 Unspecified staphylococcus as the cause of diseases classified elsewhere: Secondary | ICD-10-CM | POA: Diagnosis not present

## 2020-08-19 ENCOUNTER — Encounter: Payer: Self-pay | Admitting: Family Medicine

## 2020-08-19 ENCOUNTER — Telehealth (HOSPITAL_BASED_OUTPATIENT_CLINIC_OR_DEPARTMENT_OTHER): Payer: Medicaid Other | Admitting: Family Medicine

## 2020-08-19 ENCOUNTER — Encounter: Payer: Self-pay | Admitting: Nurse Practitioner

## 2020-08-19 DIAGNOSIS — Z419 Encounter for procedure for purposes other than remedying health state, unspecified: Secondary | ICD-10-CM | POA: Diagnosis not present

## 2020-08-19 DIAGNOSIS — S91331A Puncture wound without foreign body, right foot, initial encounter: Secondary | ICD-10-CM | POA: Diagnosis not present

## 2020-08-19 NOTE — Progress Notes (Signed)
Virtual Visit via Video Note  I connected with Kimberly [name], on 08/19/2020 at 1:51 PM by video enabled telemedicine device due to the COVID-19 pandemic and verified that I am speaking with the correct person using two identifiers.   Consent: I discussed the limitations, risks, security and privacy concerns of performing an evaluation and management service by telemedicine and the availability of in person appointments. I also discussed with the patient that there may be a patient responsible charge related to this service. The patient expressed understanding and agreed to proceed.   Location of Patient: Home  Location of Provider: Clinic   Persons participating in Telemedicine visit: Kimberly [name] Dr. Margarita Rana     History of Present Illness: 30 year old female with a history of anxiety and depression, hypothyroidism, chronic low back pain, COVID-19 in 07/2019 who presents for an acute visit. 2 days ago she stepped on a plastic toy belonging to one of her children and injured the sole of her foot. She cleaned it and states she tried to stay off it but she has little children at home and is also doing some walking and weightbearing.  She denies purulent discharge from the lesion and does endorse presence of pain in her foot.  Denies presence of fever. Her last tetanus vaccine was in 04/2019. Of note she completed a course of doxycycline which was prescribed 2 weeks ago for MRSA infection on her abdomen.   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    Iron deficiency anemia    Migraines    "weekly" (11/16/2012)   Oligomenorrhea  03/01/2011   Ovarian cyst    Pregnancy induced hypertension    Schizophrenia ([place])    "moderate" (11/16/2012)   Scoliosis    Allergies  Allergen Reactions   Cold & Cough Daytime [Acetaminophen-Dm] Shortness Of Breath    Could not tolerate- orange liquid, may have contained Pseudoephedrine- "I could not tolerate"   Sulfa Antibiotics Swelling   Zofran [Ondansetron] Other (See Comments)    Muscle Spasms   Abilify [Aripiprazole] Other (See Comments)    Lethargy, unable to function   Amoxicillin-Pot Clavulanate Other (See Comments)    Stomach pains Has patient had a PCN reaction causing immediate rash, facial/tongue/throat swelling, SOB or lightheadedness with hypotension: No Has patient had a PCN reaction causing severe rash involving mucus membranes or skin necrosis: No Has patient had a PCN reaction that required hospitalization No Has patient had a PCN reaction occurring within the last 10 years: Yes If all of the above answers are "NO", then may proceed with Cephalosporin use.    Cephalexin Other (See Comments)    Stomach pains Pt taking Cefadroxil as outpatient   Latex Swelling and Other (See Comments)    Burning of skin, too   Phenergan [Promethazine]     'gave me twitches' 'not able to sit still'   Tape Other (See Comments)    Swelling, burning of skin .    Zicam Cold Remedy [Homeopathic Products] Nausea And Vomiting   Keflex [Cephalexin] Rash and Other (See Comments)  Rash--able to take w/ Benadryl   Vicodin [Hydrocodone-Acetaminophen] Hives and Rash    Per pt- makes her very sleepy, feels like she isn't getting her breath. States she does not have throat/tongue swelling or anaphylaxis.    Current Outpatient Medications on File Prior to Visit  Medication Sig Dispense Refill   acetaminophen (TYLENOL) 325 MG tablet Take 650 mg by mouth every 6 (six) hours as needed. (Patient not taking: No sig reported)     cetirizine (ZYRTEC) 10 MG tablet TAKE 1 TABLET(10 MG) BY MOUTH  DAILY 30 tablet 1   clindamycin (CLEOCIN) 300 MG capsule Take 1 capsule (300 mg total) by mouth in the morning and at bedtime. (Patient not taking: Reported on 01/30/2020) 20 capsule 0   doxycycline (VIBRA-TABS) 100 MG tablet Take 1 tablet (100 mg total) by mouth 2 (two) times daily. 20 tablet 0   enalapril (VASOTEC) 5 MG tablet TAKE 1 TABLET(5 MG) BY MOUTH DAILY (Patient not taking: No sig reported) 30 tablet 0   FLUoxetine (PROZAC) 20 MG capsule Take 1 capsule (20 mg total) by mouth daily. 90 capsule 0   fluticasone (FLONASE) 50 MCG/ACT nasal spray Place 2 sprays into both nostrils daily. (Patient not taking: Reported on 01/30/2020) 16 g 1   hydrOXYzine (ATARAX/VISTARIL) 25 MG tablet Take 1 tablet (25 mg total) by mouth 3 (three) times daily as needed. 180 tablet 0   levothyroxine (SYNTHROID) 75 MCG tablet Take 1 tablet (75 mcg total) by mouth daily before breakfast. 90 tablet 1   meclizine (ANTIVERT) 25 MG tablet Take 1 tablet (25 mg total) by mouth 3 (three) times daily as needed for dizziness. 90 tablet 0   menthol-cetylpyridinium (CEPACOL REGULAR STRENGTH) 3 MG lozenge Take 1 lozenge (3 mg total) by mouth every 2 (two) hours as needed for sore throat. 30 tablet 0   methocarbamol (ROBAXIN) 500 MG tablet Take 1 tablet (500 mg total) by mouth every 8 (eight) hours as needed for muscle spasms. 60 tablet 1   norgestimate-ethinyl estradiol (ORTHO-CYCLEN) 0.25-35 MG-MCG tablet Take 1 tablet by mouth daily. (Patient not taking: No sig reported) 28 tablet 11   omeprazole (PRILOSEC) 20 MG capsule Take 1 capsule (20 mg total) by mouth daily. 90 capsule 3   oxyCODONE-acetaminophen (PERCOCET/ROXICET) 5-325 MG tablet Take 1 tablet by mouth every 6 (six) hours as needed for moderate pain. (Patient not taking: No sig reported) 10 tablet 0   No current facility-administered medications on file prior to visit.    ROS: See HPI  Observations/Objective: Awake, alert, oriented x3 Not in acute  distress Musculoskeletal-sole of right foot with puncture wound, erythema to the lateral aspect of foot.  No edema noted  Assessment and Plan: 1. Puncture wound of right foot, initial encounter No evidence of infection Advised to continue with cleaning the wound and avoiding weightbearing I have advised her that given her multiple medication allergies and recently completing a course of doxycycline an antibiotic is not indicated at this time to prevent her developing antibiotic resistance In the next 24 to 48 hours if she notices worsening of symptoms which we have discussed she needs to notify me and I will send in an antibiotic to her pharmacy. I have provided her with a note for work to excuse her for the next 3 days.   Follow Up Instructions: Keep previously scheduled appointment   I discussed the assessment and treatment plan with the patient. The patient was provided an opportunity to ask questions and all were answered.  The patient agreed with the plan and demonstrated an understanding of the instructions.   The patient was advised to call back or seek an in-person evaluation if the symptoms worsen or if the condition fails to improve as anticipated.     I provided 12 minutes total of Telehealth time during this encounter including median intraservice time, reviewing previous notes, investigations, ordering medications, medical decision making, coordinating care and patient verbalized understanding at the end of the visit.     Charlott Rakes, MD, FAAFP. [facility] [place], [place]   08/19/2020, 1:51 PM

## 2020-08-21 ENCOUNTER — Telehealth: Payer: Medicaid Other | Admitting: Nurse Practitioner

## 2020-08-27 ENCOUNTER — Encounter: Payer: Self-pay | Admitting: Family Medicine

## 2020-08-28 ENCOUNTER — Other Ambulatory Visit: Payer: Self-pay | Admitting: Family Medicine

## 2020-08-28 ENCOUNTER — Encounter: Payer: Self-pay | Admitting: Family Medicine

## 2020-08-28 MED ORDER — DOXYCYCLINE HYCLATE 100 MG PO TABS
100.0000 mg | ORAL_TABLET | Freq: Two times a day (BID) | ORAL | 0 refills | Status: DC
Start: 2020-08-28 — End: 2020-10-23

## 2020-08-30 ENCOUNTER — Other Ambulatory Visit: Payer: Self-pay | Admitting: Family Medicine

## 2020-08-30 MED ORDER — SULFAMETHOXAZOLE-TRIMETHOPRIM 800-160 MG PO TABS: 1.0000 | ORAL_TABLET | Freq: Two times a day (BID) | ORAL | 0 refills | Status: DC

## 2020-09-15 DIAGNOSIS — Z20822 Contact with and (suspected) exposure to covid-19: Secondary | ICD-10-CM | POA: Diagnosis not present

## 2020-09-19 DIAGNOSIS — Z419 Encounter for procedure for purposes other than remedying health state, unspecified: Secondary | ICD-10-CM | POA: Diagnosis not present

## 2020-10-08 ENCOUNTER — Ambulatory Visit: Payer: Medicaid Other | Attending: Family Medicine | Admitting: Family Medicine

## 2020-10-08 ENCOUNTER — Encounter: Payer: Self-pay | Admitting: Family Medicine

## 2020-10-08 ENCOUNTER — Other Ambulatory Visit: Payer: Self-pay

## 2020-10-08 DIAGNOSIS — H00014 Hordeolum externum left upper eyelid: Secondary | ICD-10-CM

## 2020-10-08 MED ORDER — ERYTHROMYCIN 5 MG/GM OP OINT: 1.0000 "application " | TOPICAL_OINTMENT | Freq: Every day | OPHTHALMIC | 0 refills | Status: DC

## 2020-10-08 NOTE — Progress Notes (Signed)
Virtual Visit via Video Note  I connected with Kimberly [name], on 10/08/2020 at 9:32 AM by video enabled telemedicine device due to the COVID-19 pandemic and verified that I am speaking with the correct person using two identifiers.   Consent: I discussed the limitations, risks, security and privacy concerns of performing an evaluation and management service by telemedicine and the availability of in person appointments. I also discussed with the patient that there may be a patient responsible charge related to this service. The patient expressed understanding and agreed to proceed.   Location of Patient: Home  Location of Provider: Clinic   Persons participating in Telemedicine visit: Kimberly [name] Dr. Margarita Rana     History of Present Illness: Kimberly [name] is a 30 y.o. year old female  with a history of anxiety and depression, hypothyroidism, chronic low back pain, COVID-19 in 07/2019 who presents for an acute visit.   Two and a half days ago she developed L upper eyelid itching and swelling. She has no symptoms in her eyeball or vision abnormality. Has been applying warm compress Noticed crusting when she wakes up in the morning but no pain. She has no fever.  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    Iron deficiency anemia    Migraines    "weekly" (11/16/2012)   Oligomenorrhea 03/01/2011   Ovarian cyst    Pregnancy induced hypertension    Schizophrenia ([place])    "moderate" (11/16/2012)   Scoliosis    Allergies  Allergen Reactions   Cold & Cough Daytime [Acetaminophen-Dm] Shortness Of Breath    Could not tolerate-  orange liquid, may have contained Pseudoephedrine- "I could not tolerate"   Sulfa Antibiotics Swelling   Zofran [Ondansetron] Other (See Comments)    Muscle Spasms   Abilify [Aripiprazole] Other (See Comments)    Lethargy, unable to function   Amoxicillin-Pot Clavulanate Other (See Comments)    Stomach pains Has patient had a PCN reaction causing immediate rash, facial/tongue/throat swelling, SOB or lightheadedness with hypotension: No Has patient had a PCN reaction causing severe rash involving mucus membranes or skin necrosis: No Has patient had a PCN reaction that required hospitalization No Has patient had a PCN reaction occurring within the last 10 years: Yes If all of the above answers are "NO", then may proceed with Cephalosporin use.    Cephalexin Other (See Comments)    Stomach pains Pt taking Cefadroxil as outpatient   Latex Swelling and Other (See Comments)    Burning of skin, too   Phenergan [Promethazine]     'gave me twitches' 'not able to sit still'   Tape Other (See Comments)    Swelling, burning of skin .    Zicam Cold Remedy [Homeopathic Products] Nausea And Vomiting   Keflex [Cephalexin] Rash and Other (See Comments)    Rash--able to take w/ Benadryl   Vicodin [Hydrocodone-Acetaminophen] Hives and Rash    Per pt- makes her very sleepy, feels like she isn't getting her breath. States she does not have throat/tongue swelling or anaphylaxis.  Current Outpatient Medications on File Prior to Visit  Medication Sig Dispense Refill   acetaminophen (TYLENOL) 325 MG tablet Take 650 mg by mouth every 6 (six) hours as needed. (Patient not taking: No sig reported)     cetirizine (ZYRTEC) 10 MG tablet TAKE 1 TABLET(10 MG) BY MOUTH DAILY 30 tablet 1   clindamycin (CLEOCIN) 300 MG capsule Take 1 capsule (300 mg total) by mouth in the morning and at bedtime. (Patient not taking: Reported on 01/30/2020) 20 capsule 0   doxycycline (VIBRA-TABS) 100 MG tablet Take 1 tablet (100 mg  total) by mouth 2 (two) times daily. 20 tablet 0   enalapril (VASOTEC) 5 MG tablet TAKE 1 TABLET(5 MG) BY MOUTH DAILY (Patient not taking: No sig reported) 30 tablet 0   FLUoxetine (PROZAC) 20 MG capsule Take 1 capsule (20 mg total) by mouth daily. 90 capsule 0   fluticasone (FLONASE) 50 MCG/ACT nasal spray Place 2 sprays into both nostrils daily. (Patient not taking: Reported on 01/30/2020) 16 g 1   hydrOXYzine (ATARAX/VISTARIL) 25 MG tablet Take 1 tablet (25 mg total) by mouth 3 (three) times daily as needed. 180 tablet 0   levothyroxine (SYNTHROID) 75 MCG tablet Take 1 tablet (75 mcg total) by mouth daily before breakfast. 90 tablet 1   meclizine (ANTIVERT) 25 MG tablet Take 1 tablet (25 mg total) by mouth 3 (three) times daily as needed for dizziness. 90 tablet 0   menthol-cetylpyridinium (CEPACOL REGULAR STRENGTH) 3 MG lozenge Take 1 lozenge (3 mg total) by mouth every 2 (two) hours as needed for sore throat. 30 tablet 0   methocarbamol (ROBAXIN) 500 MG tablet Take 1 tablet (500 mg total) by mouth every 8 (eight) hours as needed for muscle spasms. 60 tablet 1   norgestimate-ethinyl estradiol (ORTHO-CYCLEN) 0.25-35 MG-MCG tablet Take 1 tablet by mouth daily. (Patient not taking: No sig reported) 28 tablet 11   omeprazole (PRILOSEC) 20 MG capsule Take 1 capsule (20 mg total) by mouth daily. 90 capsule 3   oxyCODONE-acetaminophen (PERCOCET/ROXICET) 5-325 MG tablet Take 1 tablet by mouth every 6 (six) hours as needed for moderate pain. (Patient not taking: No sig reported) 10 tablet 0   sulfamethoxazole-trimethoprim (BACTRIM DS) 800-160 MG tablet Take 1 tablet by mouth 2 (two) times daily. 14 tablet 0   No current facility-administered medications on file prior to visit.    ROS: See HPI  Observations/Objective: General-awake, alert, oriented x3 Eyes-edema and erythema of left upper eyelid with external hordeolum on medial aspect. Respiration-not in acute distress Extremities-normal range of  motion.  CMP Latest Ref Rng & Units 08/10/2019 08/04/2019 08/03/2019  Glucose 70 - 99 mg/dL 101(H) 86 111(H)  BUN 6 - 20 mg/dL 5(L) 8 10  Creatinine 0.44 - 1.00 mg/dL 0.53 0.54 0.56  Sodium 135 - 145 mmol/L 138 141 135  Potassium 3.5 - 5.1 mmol/L 2.8(L) 3.4(L) 3.8  Chloride 98 - 111 mmol/L 99 108 105  CO2 22 - 32 mmol/L 26 23 22   Calcium 8.9 - 10.3 mg/dL 8.0(L) 8.5(L) 8.5(L)  Total Protein 6.5 - 8.1 g/dL 4.7(L) 4.9(L) 4.5(L)  Total Bilirubin 0.3 - 1.2 mg/dL 1.3(H) 0.3 0.7  Alkaline Phos 38 - 126 U/L 113 105 97  AST 15 - 41 U/L 21 20 21   ALT 0 - 44 U/L 18 12 12     Lipid Panel     Component Value Date/Time   CHOL 178 06/08/2016 0936   TRIG 237 (H) 06/08/2016 0936   HDL 41 06/08/2016 0936  CHOLHDL 4.3 06/08/2016 0936   CHOLHDL 4.4 08/07/2013 1141   VLDL 29 08/07/2013 1141   LDLCALC 90 06/08/2016 0936   LABVLDL 47 (H) 06/08/2016 0936    Lab Results  Component Value Date   HGBA1C 4.9 06/28/2019     Assessment and Plan: 1. Hordeolum externum of left upper eyelid Continue to apply warm compress - erythromycin ophthalmic ointment; Place 1 application into the left eye at bedtime.  Dispense: 3.5 g; Refill: 0   Follow Up Instructions: Keep previously scheduled appointment   I discussed the assessment and treatment plan with the patient. The patient was provided an opportunity to ask questions and all were answered. The patient agreed with the plan and demonstrated an understanding of the instructions.   The patient was advised to call back or seek an in-person evaluation if the symptoms worsen or if the condition fails to improve as anticipated.     I provided 13 minutes total of Telehealth time during this encounter including median intraservice time, reviewing previous notes, investigations, ordering medications, medical decision making, coordinating care and patient verbalized understanding at the end of the visit.     Charlott Rakes, MD, FAAFP. [facility] [place], [place]   10/08/2020, 9:32 AM

## 2020-10-11 ENCOUNTER — Encounter: Payer: Self-pay | Admitting: Family Medicine

## 2020-10-14 ENCOUNTER — Other Ambulatory Visit: Payer: Self-pay | Admitting: Family Medicine

## 2020-10-14 DIAGNOSIS — H00014 Hordeolum externum left upper eyelid: Secondary | ICD-10-CM

## 2020-10-17 ENCOUNTER — Encounter: Payer: Medicaid Other | Admitting: Physical Medicine and Rehabilitation

## 2020-10-19 DIAGNOSIS — Z419 Encounter for procedure for purposes other than remedying health state, unspecified: Secondary | ICD-10-CM | POA: Diagnosis not present

## 2020-10-21 ENCOUNTER — Encounter: Payer: Self-pay | Admitting: Family Medicine

## 2020-10-23 ENCOUNTER — Encounter (HOSPITAL_COMMUNITY): Payer: Self-pay

## 2020-10-23 ENCOUNTER — Emergency Department (HOSPITAL_COMMUNITY): Payer: Medicaid Other

## 2020-10-23 ENCOUNTER — Other Ambulatory Visit: Payer: Self-pay

## 2020-10-23 ENCOUNTER — Emergency Department (HOSPITAL_COMMUNITY)
Admission: EM | Admit: 2020-10-23 | Discharge: 2020-10-23 | Disposition: A | Discharge status: Home / Self Care | Payer: Medicaid Other | Attending: Emergency Medicine | Admitting: Emergency Medicine

## 2020-10-23 DIAGNOSIS — R1032 Left lower quadrant pain: Secondary | ICD-10-CM | POA: Insufficient documentation

## 2020-10-23 DIAGNOSIS — N83209 Unspecified ovarian cyst, unspecified side: Secondary | ICD-10-CM | POA: Diagnosis not present

## 2020-10-23 DIAGNOSIS — J019 Acute sinusitis, unspecified: Secondary | ICD-10-CM | POA: Diagnosis not present

## 2020-10-23 DIAGNOSIS — Z20822 Contact with and (suspected) exposure to covid-19: Secondary | ICD-10-CM | POA: Insufficient documentation

## 2020-10-23 DIAGNOSIS — J45909 Unspecified asthma, uncomplicated: Secondary | ICD-10-CM | POA: Insufficient documentation

## 2020-10-23 DIAGNOSIS — E039 Hypothyroidism, unspecified: Secondary | ICD-10-CM | POA: Insufficient documentation

## 2020-10-23 DIAGNOSIS — N83202 Unspecified ovarian cyst, left side: Secondary | ICD-10-CM | POA: Diagnosis not present

## 2020-10-23 LAB — CBC WITH DIFFERENTIAL/PLATELET
Abs Immature Granulocytes: 0.05 10*3/uL (ref 0.00–0.07)
Basophils Absolute: 0 10*3/uL (ref 0.0–0.1)
Basophils Relative: 0 %
Eosinophils Absolute: 0.3 10*3/uL (ref 0.0–0.5)
Eosinophils Relative: 2 %
HCT: 37.4 % (ref 36.0–46.0)
Hemoglobin: 11.5 g/dL — ABNORMAL LOW (ref 12.0–15.0)
Immature Granulocytes: 0 %
Lymphocytes Relative: 20 %
Lymphs Abs: 2.9 10*3/uL (ref 0.7–4.0)
MCH: 25.7 pg — ABNORMAL LOW (ref 26.0–34.0)
MCHC: 30.7 g/dL (ref 30.0–36.0)
MCV: 83.7 fL (ref 80.0–100.0)
Monocytes Absolute: 0.8 10*3/uL (ref 0.1–1.0)
Monocytes Relative: 5 %
Neutro Abs: 10.6 10*3/uL — ABNORMAL HIGH (ref 1.7–7.7)
Neutrophils Relative %: 73 %
Platelets: 458 10*3/uL — ABNORMAL HIGH (ref 150–400)
RBC: 4.47 MIL/uL (ref 3.87–5.11)
RDW: 14.6 % (ref 11.5–15.5)
WBC: 14.6 10*3/uL — ABNORMAL HIGH (ref 4.0–10.5)
nRBC: 0 % (ref 0.0–0.2)

## 2020-10-23 LAB — COMPREHENSIVE METABOLIC PANEL
ALT: 14 U/L (ref 0–44)
AST: 15 U/L (ref 15–41)
Albumin: 3.9 g/dL (ref 3.5–5.0)
Alkaline Phosphatase: 83 U/L (ref 38–126)
Anion gap: 11 (ref 5–15)
BUN: 12 mg/dL (ref 6–20)
CO2: 25 mmol/L (ref 22–32)
Calcium: 9.4 mg/dL (ref 8.9–10.3)
Chloride: 104 mmol/L (ref 98–111)
Creatinine, Ser: 0.36 mg/dL — ABNORMAL LOW (ref 0.44–1.00)
GFR, Estimated: >60 mL/min (ref >60)
Glucose, Bld: 115 mg/dL — ABNORMAL HIGH (ref 70–99)
Potassium: 3.9 mmol/L (ref 3.5–5.1)
Sodium: 140 mmol/L (ref 135–145)
Total Bilirubin: 0.5 mg/dL (ref 0.3–1.2)
Total Protein: 7.7 g/dL (ref 6.5–8.1)

## 2020-10-23 LAB — I-STAT BETA HCG BLOOD, ED (MC, WL, AP ONLY): I-stat hCG, quantitative: <5 m[IU]/mL (ref <5)

## 2020-10-23 LAB — URINALYSIS, ROUTINE W REFLEX MICROSCOPIC
Bilirubin Urine: NEGATIVE
Glucose, UA: NEGATIVE mg/dL
Ketones, ur: NEGATIVE mg/dL
Leukocytes,Ua: NEGATIVE
Nitrite: NEGATIVE
Protein, ur: NEGATIVE mg/dL
Specific Gravity, Urine: 1.016 (ref 1.005–1.030)
pH: 5 (ref 5.0–8.0)

## 2020-10-23 LAB — RESP PANEL BY RT-PCR (FLU A&B, COVID) ARPGX2
Influenza A by PCR: NEGATIVE
Influenza B by PCR: NEGATIVE
SARS Coronavirus 2 by RT PCR: NEGATIVE

## 2020-10-23 MED ORDER — ACETAMINOPHEN 500 MG PO TABS
1000.0000 mg | ORAL_TABLET | Freq: Once | ORAL | Status: AC
  Administered 2020-10-23: 1000 mg via ORAL
  Filled 2020-10-23: qty 2

## 2020-10-23 MED ORDER — DOXYCYCLINE HYCLATE 100 MG PO CAPS: 100.0000 mg | ORAL_CAPSULE | Freq: Two times a day (BID) | ORAL | 0 refills | Status: AC

## 2020-10-23 MED ORDER — LOPERAMIDE HCL 2 MG PO CAPS
4.0000 mg | ORAL_CAPSULE | Freq: Once | ORAL | Status: AC
  Administered 2020-10-23: 4 mg via ORAL
  Filled 2020-10-23: qty 2

## 2020-10-23 NOTE — ED Notes (Signed)
Labeled specimen cup provided to pt for urine collection per MD order. ENMiles 

## 2020-10-23 NOTE — ED Provider Notes (Signed)
[facility] Emergency Department Provider Note MRN:  [id number]  Arrival date & time: 10/23/20     Chief Complaint   URI, Abdominal Pain, Emesis, and Diarrhea   History of Present Illness   Kimberly [name] is a 30 y.o. year-old female with a history of ovarian cyst presenting to the ED with chief complaint of URI.  Multiple complaints today.  Patient has been having upper respiratory infection symptoms for the past 2 weeks.  Persistent nasal congestion progressing to head fullness and headache on the left side.  Mild cough, no sore throat.  Occasional nausea and vomiting.  Multiple family members also sick.  No chest pain or shortness of breath.  Patient also experiencing left lower quadrant pain, has a history of ovarian cyst on the side.  Has been getting a bit better over the past day but still quite painful.  Moderate in severity, no exacerbating or alleviating factors.  No dysuria, currently on menstrual cycle.  Review of Systems  A complete 10 system review of systems was obtained and all systems are negative except as noted in the HPI and PMH.   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    Iron deficiency anemia    Migraines    "weekly" (11/16/2012)   Oligomenorrhea 03/01/2011   Ovarian cyst    Pregnancy induced hypertension    Schizophrenia ([place])    "moderate" (11/16/2012)   Scoliosis     Past Surgical History:  Procedure Laterality Date   CESAREAN SECTION N/A 08/02/2019   Procedure: CESAREAN SECTION;  Surgeon: Gwynne Edinger, MD;  Location: [facility] LD [facility];  Service:  Obstetrics;  Laterality: N/A;   LAPAROSCOPIC OVARIAN CYSTECTOMY Left 02/11/2016   Procedure: LAPAROSCOPIC OVARIAN CYSTECTOMY;  Surgeon: Emily Filbert, MD;  Location: [facility];  Service: Gynecology;  Laterality: Left;   UPPER GI ENDOSCOPY  ~ 2006; ~2008    Family History  Problem Relation Age of Onset   Diabetes Mother    Hypertension Mother    Vision loss Mother    Hypertension Father    Hyperlipidemia Father    Thyroid disease Maternal Grandmother     Social History   Socioeconomic History   Marital status: Single    Spouse name: Not on file   Number of children: 1   Years of education: Not on file   Highest education level: High school graduate  Occupational History   Not on file  Tobacco Use   Smoking status: Never   Smokeless tobacco: Never  Vaping Use   Vaping Use: Never used  Substance and Sexual Activity   Alcohol use: Not Currently    Comment: very rare   Drug use: No   Sexual activity: Yes    Birth control/protection: None  Other Topics Concern   Not on file  Social History Narrative   Not on file   Social Determinants of Health   Financial Resource Strain: Not on file  Food Insecurity: Not on file  Transportation Needs: Not on file  Physical  Activity: Not on file  Stress: Not on file  Social Connections: Not on file  Intimate Partner Violence: Not on file     Physical Exam   Vitals:   10/23/20 0518 10/23/20 0521  BP: (!) 155/115   Pulse: (!) 130 (!) 115  Resp: 20   Temp: 99.4 F (37.4 C)   SpO2: 100%     CONSTITUTIONAL: Well-appearing, NAD NEURO:  Alert and oriented x 3, no focal deficits EYES:  eyes equal and reactive ENT/NECK:  no LAD, no JVD CARDIO: Regular rate, well-perfused, normal S1 and S2 PULM:  CTAB no wheezing or rhonchi GI/GU:  normal bowel sounds, non-distended, non-tender MSK/SPINE:  No gross deformities, no edema SKIN:  no rash, atraumatic PSYCH:  Appropriate speech and behavior  *Additional and/or pertinent findings  included in MDM below  Diagnostic and Interventional Summary    EKG Interpretation  Date/Time:    Ventricular Rate:    PR Interval:    QRS Duration:   QT Interval:    QTC Calculation:   R Axis:     Text Interpretation:         Labs Reviewed  RESP PANEL BY RT-PCR (FLU A&B, COVID) ARPGX2  CBC WITH DIFFERENTIAL/PLATELET  COMPREHENSIVE METABOLIC PANEL  URINALYSIS, ROUTINE W REFLEX MICROSCOPIC  I-STAT BETA HCG BLOOD, ED ([facility], [facility], [facility] ONLY)    US Pelvis Complete    (Results Pending)  US Transvaginal Non-OB    (Results Pending)  [place] Art/Ven Flow Abd Pelv Doppler    (Results Pending)    Medications - No data to display   Procedures  /  Critical Care Procedures  ED Course and Medical Decision Making  I have reviewed the triage vital signs, the nursing notes, and pertinent available records from the EMR.  Listed above are laboratory and imaging tests that I personally ordered, reviewed, and interpreted and then considered in my medical decision making (see below for details).  Suspect viral illness, possibly secondary bacterial sinusitis.  Also with left lower quadrant pain, reportedly has an ovarian cyst up to 11 cm in the past.  Was supposed to follow-up with OB/GYN and get repeat ultrasound but not but has not.  We will repeat ultrasound and refer to OB/GYN.  We will put on antibiotics for likely sinusitis.  Awaiting screening labs, ultrasound, signed out to oncoming provider at shift change.       Barth Kirks. [place] Small, [place] [email or web address]  Final Clinical Impressions(s) / ED Diagnoses     ICD-10-CM   1. Left lower quadrant abdominal pain  R10.32     2. Acute sinusitis, recurrence not specified, unspecified location  J01.90       ED Discharge Orders          Ordered    doxycycline (VIBRAMYCIN) 100 MG capsule  2 times daily        10/23/20 0601             Discharge Instructions Discussed with and  Provided to Patient:   Discharge Instructions   None       Maudie Flakes, MD 10/23/20 [phone]

## 2020-10-23 NOTE — ED Triage Notes (Signed)
Pt complains of emesis, diarrhea, and headache x 3 days. Pt reports having an upper respiratory infection 2 weeks ago that triggered the headaches. Pt complains of abdominal pain due to an ovarian cyst on her left side.

## 2020-10-23 NOTE — Discharge Instructions (Addendum)
Follow-up with your family doctor if the signs infection does not get better and follow-up with the OB/GYN doctor you have been referred to for your ovarian cyst.  Take Motrin and or Tylenol for pain.  Have your blood pressure checked in the next couple weeks it is mildly elevated

## 2020-10-28 ENCOUNTER — Other Ambulatory Visit: Payer: Self-pay | Admitting: Medical

## 2020-10-28 DIAGNOSIS — K219 Gastro-esophageal reflux disease without esophagitis: Secondary | ICD-10-CM

## 2020-11-19 DIAGNOSIS — Z419 Encounter for procedure for purposes other than remedying health state, unspecified: Secondary | ICD-10-CM | POA: Diagnosis not present

## 2020-11-25 ENCOUNTER — Other Ambulatory Visit: Payer: Self-pay | Admitting: Family Medicine

## 2020-11-25 ENCOUNTER — Encounter: Payer: Self-pay | Admitting: Family Medicine

## 2020-11-25 DIAGNOSIS — K219 Gastro-esophageal reflux disease without esophagitis: Secondary | ICD-10-CM

## 2020-11-25 DIAGNOSIS — F419 Anxiety disorder, unspecified: Secondary | ICD-10-CM

## 2020-11-25 DIAGNOSIS — F32A Depression, unspecified: Secondary | ICD-10-CM

## 2020-11-25 MED ORDER — FLUOXETINE HCL 20 MG PO CAPS: 20.0000 mg | ORAL_CAPSULE | Freq: Every day | ORAL | 0 refills | Status: DC

## 2020-11-25 MED ORDER — FLUOXETINE HCL 20 MG PO CAPS
20.0000 mg | ORAL_CAPSULE | Freq: Every day | ORAL | 0 refills | Status: DC
Start: 2020-11-25 — End: 2021-05-05

## 2020-11-25 MED ORDER — OMEPRAZOLE 20 MG PO CPDR: 20.0000 mg | DELAYED_RELEASE_CAPSULE | Freq: Every day | ORAL | 3 refills | Status: DC

## 2020-11-25 NOTE — Telephone Encounter (Signed)
Already signed today .  Requested Prescriptions  Refused Prescriptions Disp Refills  . omeprazole (PRILOSEC) 20 MG capsule [Pharmacy Med Name: OMEPRAZOLE 20MG  CAPSULES] 30 capsule     Sig: TAKE 1 CAPSULE(20 MG) BY MOUTH DAILY     Gastroenterology: Proton Pump Inhibitors Passed - 11/25/2020 10:17 AM      Passed - Valid encounter within last 12 months    Recent Outpatient Visits          1 month ago Hordeolum externum of left upper eyelid   [place], Charlane Ferretti, MD   3 months ago Puncture wound of right foot, initial encounter   [place], [place], MD   10 months ago [place], Enobong, MD   11 months ago Upper respiratory tract infection, unspecified type   [place], [place], NP   12 months ago Upper respiratory tract infection, unspecified type   [place], Enobong, MD      Future Appointments            In 2 months Raulkar, Clide Deutscher, MD [facility] [facility], [facility]

## 2020-11-26 ENCOUNTER — Encounter (HOSPITAL_COMMUNITY): Payer: Self-pay | Admitting: Radiology

## 2020-12-05 ENCOUNTER — Encounter: Payer: Self-pay | Admitting: Family Medicine

## 2020-12-06 NOTE — Telephone Encounter (Signed)
Patient aware of message per GYN office on 9/17 and message per Dr. Margarita Rana.  Verbalized understanding and will call GYN office to schedule appt.

## 2020-12-06 NOTE — Telephone Encounter (Signed)
Copied from [place] [phone]. Topic: General - Other >> Dec 05, 2020 12:56 PM Leward Quan A wrote: Reason for CRM: Patient called in to discuss with Dr Margarita Rana about a letter she received about her abdominal ultrasound say that there were additional findings and she need further explanation on the letter and the findings. Please call  Ph# [phone]

## 2020-12-18 ENCOUNTER — Other Ambulatory Visit: Payer: Self-pay | Admitting: Family Medicine

## 2020-12-18 DIAGNOSIS — R42 Dizziness and giddiness: Secondary | ICD-10-CM

## 2020-12-18 MED ORDER — MECLIZINE HCL 25 MG PO TABS: 25.0000 mg | ORAL_TABLET | Freq: Three times a day (TID) | ORAL | 0 refills | Status: DC | PRN

## 2020-12-19 ENCOUNTER — Encounter: Payer: Self-pay | Admitting: Family Medicine

## 2020-12-19 DIAGNOSIS — Z419 Encounter for procedure for purposes other than remedying health state, unspecified: Secondary | ICD-10-CM | POA: Diagnosis not present

## 2020-12-20 ENCOUNTER — Other Ambulatory Visit: Payer: Self-pay | Admitting: Family Medicine

## 2020-12-20 MED ORDER — SULFAMETHOXAZOLE-TRIMETHOPRIM 800-160 MG PO TABS: 1.0000 | ORAL_TABLET | Freq: Two times a day (BID) | ORAL | 0 refills | Status: DC

## 2021-01-19 DIAGNOSIS — Z419 Encounter for procedure for purposes other than remedying health state, unspecified: Secondary | ICD-10-CM | POA: Diagnosis not present

## 2021-01-23 ENCOUNTER — Ambulatory Visit: Payer: Medicaid Other | Admitting: Obstetrics and Gynecology

## 2021-01-24 ENCOUNTER — Ambulatory Visit (INDEPENDENT_AMBULATORY_CARE_PROVIDER_SITE_OTHER): Payer: Medicaid Other | Admitting: Obstetrics and Gynecology

## 2021-01-24 ENCOUNTER — Encounter: Payer: Self-pay | Admitting: Obstetrics and Gynecology

## 2021-01-24 ENCOUNTER — Other Ambulatory Visit: Payer: Self-pay

## 2021-01-24 VITALS — BP 128/83 | HR 95 | Ht 64.0 in | Wt 264.8 lb

## 2021-01-24 DIAGNOSIS — N83202 Unspecified ovarian cyst, left side: Secondary | ICD-10-CM | POA: Diagnosis not present

## 2021-01-24 DIAGNOSIS — F419 Anxiety disorder, unspecified: Secondary | ICD-10-CM

## 2021-01-24 DIAGNOSIS — F32A Depression, unspecified: Secondary | ICD-10-CM

## 2021-01-24 NOTE — Patient Instructions (Signed)
Ovarian Cyst An ovarian cyst is a fluid-filled sac on an ovary. Most of these cysts go away on their own and are not cancer. Some cysts need treatment. What are the causes? Ovarian hyperstimulation syndrome. Some medicines may lead to this problem. Polycystic ovarian syndrome (PCOS). Problems with body chemicals (hormones) can lead to this condition. The normal menstrual cycle. What increases the risk? Being overweight or very overweight. Taking medicines to increase your chance of getting pregnant. Using some types of birth control. Smoking. What are the signs or symptoms? Many ovarian cysts do not cause symptoms. If you get symptoms, you may have: Pain or pressure in the area between the hip bones. Pain in the lower belly. Pain during sex. Swelling in the lower belly. Periods that are not regular. Pain with periods. How is this treated? Many ovarian cysts go away on their own without treatment. If you need treatment, it may include: Medicines for pain. Fluid taken out of the cyst. The cyst being taken out. Birth control pills or other medicines. Surgery to remove the ovary. Follow these instructions at home: Take over-the-counter and prescription medicines only as told by your doctor. Ask your doctor if you should avoid driving or using machines while you are taking your medicine. Get exams and Pap tests as told by your doctor. Return to your normal activities when your doctor says that it is safe. Do not smoke or use any products that contain nicotine or tobacco. If you need help quitting, ask your doctor. Keep all follow-up visits. Contact a doctor if: Your periods: Are late. Are not regular. Stop. Are painful. You have pain in the area between your hip bones, and the pain does not go away. You feel pressure on your bladder. You have trouble peeing. You feel full, or your belly hurts, swells, or bloats. You gain or lose weight without trying, or you are less hungry than  normal. You feel pain and pressure in your back. You feel pain and pressure in the area between your hip bones. You think you may be pregnant. Get help right away if: You have pain in your belly that is very bad or gets worse. You have pain in the area between your hip bones, and the pain is very bad or gets worse. You cannot eat or drink without vomiting. You get a fever or chills all of a sudden. Your period is a lot heavier than usual. Summary An ovarian cyst is a fluid-filled sac on an ovary. Some cysts may cause problems and need treatment. Most of these cysts go away on their own. This information is not intended to replace advice given to you by your health care provider. Make sure you discuss any questions you have with your health care provider. Document Revised: 06/15/2019 Document Reviewed: 06/15/2019 Elsevier Patient Education  2022 [employer].

## 2021-01-24 NOTE — Progress Notes (Signed)
Kimberly [name] presents for follow from left ovarian cyst noted on 10/22. Pt without any GYN complaints today Pap smear UTD  PE AF VSS Lungs clear Heart RRR Abd soft + BS  A/P Left Ovarian cyst  Reviewed with pt. Will schedule F/U GYN U/S. F/U per U/S results

## 2021-01-27 ENCOUNTER — Encounter
Payer: Medicaid Other | Attending: Physical Medicine and Rehabilitation | Admitting: Physical Medicine and Rehabilitation

## 2021-01-27 NOTE — BH Specialist Note (Deleted)
Integrated Behavioral Health via Telemedicine Visit  01/27/2021 Kimberly [name] [id number]  Number of [place] visits: 1 Session Start time: 8:15***  Session End time: 9:15*** Total time: {IBH Total Time:21014050}  Referring Provider: *** Patient/Family location: Home*** [facility] Provider location: [facility] [facility]  All persons participating in visit: Patient *** and Kimberly [name] ***  Types of Service: {CHL AMB TYPE OF SERVICE:[phone]}  I connected with Kimberly [name] and/or Kimberly [name] {family members:20773} via  Telephone or Video Enabled Telemedicine Application  (Video is Caregility application) and verified that I am speaking with the correct person using two identifiers. Discussed confidentiality: {YES/NO:21197}  I discussed the limitations of telemedicine and the availability of in person appointments.  Discussed there is a possibility of technology failure and discussed alternative modes of communication if that failure occurs.  I discussed that engaging in this telemedicine visit, they consent to the provision of behavioral healthcare and the services will be billed under their insurance.  Patient and/or legal guardian expressed understanding and consented to Telemedicine visit: {YES/NO:21197}  Presenting Concerns: Patient and/or family reports the following symptoms/concerns: *** Duration of problem: ***; Severity of problem: {Mild/Moderate/Severe:20260}  Patient and/or Family's Strengths/Protective Factors: {CHL AMB BH PROTECTIVE FACTORS:[phone]}  Goals Addressed: Patient will:  Reduce symptoms of: {IBH Symptoms:21014056}   Increase knowledge and/or ability of: {IBH Patient Tools:21014057}   Demonstrate ability to: {IBH Goals:21014053}  Progress towards Goals: {CHL AMB BH PROGRESS TOWARDS GOALS:5172961407}  Interventions: Interventions utilized:  {IBH  Interventions:21014054} Standardized Assessments completed: {IBH Screening Tools:21014051}  Patient and/or Family Response: ***  Assessment: Patient currently experiencing ***.   Patient may benefit from ***.  Plan: Follow up with behavioral health clinician on : *** Behavioral recommendations: *** Referral(s): {IBH Referrals:21014055}  I discussed the assessment and treatment plan with the patient and/or parent/guardian. They were provided an opportunity to ask questions and all were answered. They agreed with the plan and demonstrated an understanding of the instructions.   They were advised to call back or seek an in-person evaluation if the symptoms worsen or if the condition fails to improve as anticipated.  Kimberly Hamman Carron Mcmurry, LCSW

## 2021-01-30 ENCOUNTER — Encounter: Payer: Self-pay | Admitting: Obstetrics and Gynecology

## 2021-01-30 ENCOUNTER — Encounter (HOSPITAL_COMMUNITY): Payer: Self-pay

## 2021-01-30 ENCOUNTER — Ambulatory Visit (HOSPITAL_COMMUNITY): Payer: Medicaid Other

## 2021-02-11 DIAGNOSIS — G43809 Other migraine, not intractable, without status migrainosus: Secondary | ICD-10-CM | POA: Diagnosis not present

## 2021-02-12 NOTE — BH Specialist Note (Signed)
Pt did not arrive to video visit and did not answer the phone; Left HIPPA-compliant message to call back Gordie Belvin from [facility] at  [phone] (Glenyce Randle's office).  ?; left MyChart message for patient.  ? ?

## 2021-02-19 DIAGNOSIS — Z419 Encounter for procedure for purposes other than remedying health state, unspecified: Secondary | ICD-10-CM | POA: Diagnosis not present

## 2021-02-26 ENCOUNTER — Ambulatory Visit: Payer: Medicaid Other | Admitting: Clinical

## 2021-02-26 ENCOUNTER — Other Ambulatory Visit: Payer: Self-pay

## 2021-02-26 DIAGNOSIS — Z91199 Patient's noncompliance with other medical treatment and regimen due to unspecified reason: Secondary | ICD-10-CM

## 2021-03-15 ENCOUNTER — Encounter (HOSPITAL_BASED_OUTPATIENT_CLINIC_OR_DEPARTMENT_OTHER): Payer: Medicaid Other | Admitting: Family Medicine

## 2021-03-15 DIAGNOSIS — L0292 Furuncle, unspecified: Secondary | ICD-10-CM | POA: Diagnosis not present

## 2021-03-16 ENCOUNTER — Encounter: Payer: Self-pay | Admitting: Obstetrics and Gynecology

## 2021-03-17 MED ORDER — SULFAMETHOXAZOLE-TRIMETHOPRIM 800-160 MG PO TABS: 1.0000 | ORAL_TABLET | Freq: Two times a day (BID) | ORAL | 0 refills | Status: DC

## 2021-03-17 NOTE — Telephone Encounter (Signed)
Treated for Furunculosis.  Patient has requested prescription for Bactrim for same condition in the past.  She previously informed me she was able to tolerate Bactrim despite the fact that sulfa allergy was listed on her chart.

## 2021-03-19 DIAGNOSIS — Z419 Encounter for procedure for purposes other than remedying health state, unspecified: Secondary | ICD-10-CM | POA: Diagnosis not present

## 2021-04-15 ENCOUNTER — Encounter: Payer: Self-pay | Admitting: Obstetrics and Gynecology

## 2021-04-15 ENCOUNTER — Encounter: Payer: Self-pay | Admitting: Family Medicine

## 2021-04-17 ENCOUNTER — Ambulatory Visit: Payer: Medicaid Other | Admitting: Family Medicine

## 2021-04-19 DIAGNOSIS — Z419 Encounter for procedure for purposes other than remedying health state, unspecified: Secondary | ICD-10-CM | POA: Diagnosis not present

## 2021-04-28 ENCOUNTER — Ambulatory Visit (HOSPITAL_COMMUNITY): Payer: Medicaid Other

## 2021-04-30 ENCOUNTER — Encounter: Payer: Self-pay | Admitting: Family Medicine

## 2021-05-01 ENCOUNTER — Ambulatory Visit (HOSPITAL_COMMUNITY): Payer: Medicaid Other

## 2021-05-03 ENCOUNTER — Other Ambulatory Visit: Payer: Self-pay | Admitting: Family Medicine

## 2021-05-03 DIAGNOSIS — F32A Depression, unspecified: Secondary | ICD-10-CM

## 2021-05-05 NOTE — Telephone Encounter (Signed)
Requested Prescriptions  ?Pending Prescriptions Disp Refills  ?? FLUoxetine (PROZAC) 20 MG capsule [Pharmacy Med Name: FLUOXETINE '20MG'$  CAPSULES] 90 capsule 0  ?  Sig: TAKE 1 CAPSULE(20 MG) BY MOUTH DAILY  ?  ? Psychiatry:  Antidepressants - SSRI Failed - 05/03/2021  5:55 PM  ?  ?  Failed - Valid encounter within last 6 months  ?  Recent Outpatient Visits   ?      ? 6 months ago Hordeolum externum of left upper eyelid  ? [place], Charlane Ferretti, MD  ? 8 months ago Puncture wound of right foot, initial encounter  ? [place] Charlott Rakes, MD  ? 1 year ago Furuncle  ? [place], Charlane Ferretti, MD  ? 1 year ago Upper respiratory tract infection, unspecified type  ? [place], NP  ? 1 year ago Upper respiratory tract infection, unspecified type  ? [place] Charlott Rakes, MD  ?  ?  ?Future Appointments   ?        ? In 5 months Raulkar, Clide Deutscher, MD [facility], CPR  ?  ? ?  ?  ?  Passed - Completed PHQ-2 or PHQ-9 in the last 360 days  ?  ?  ? ? ?

## 2021-05-08 ENCOUNTER — Ambulatory Visit (HOSPITAL_COMMUNITY): Payer: Medicaid Other

## 2021-05-09 ENCOUNTER — Other Ambulatory Visit: Payer: Self-pay | Admitting: Family Medicine

## 2021-05-09 DIAGNOSIS — F419 Anxiety disorder, unspecified: Secondary | ICD-10-CM

## 2021-05-09 DIAGNOSIS — K219 Gastro-esophageal reflux disease without esophagitis: Secondary | ICD-10-CM

## 2021-05-09 MED ORDER — HYDROXYZINE HCL 25 MG PO TABS: 25.0000 mg | ORAL_TABLET | Freq: Three times a day (TID) | ORAL | 0 refills | Status: DC | PRN

## 2021-05-09 NOTE — Telephone Encounter (Signed)
Medication Refill - Medication:  ? ?omeprazole (PRILOSEC) 20 MG capsule ? ?Has the patient contacted their pharmacy? Yes.  Pt was advised to contact pcp office.  ?(Agent: If no, request that the patient contact the pharmacy for the refill. If patient does not wish to contact the pharmacy document the reason why and proceed with request.) ?(Agent: If yes, when and what did the pharmacy advise?) ? ?Preferred Pharmacy (with phone number or street name):  ? ?[id number] - [place], [address] [place]  ?[address] [place] [address]  ?Phone: [phone] Fax: [phone]  ? ?Has the patient been seen for an appointment in the last year OR does the patient have an upcoming appointment? Yes.   ? ?Agent: Please be advised that RX refills may take up to 3 business days. We ask that you follow-up with your pharmacy. ? ?

## 2021-05-09 NOTE — Telephone Encounter (Signed)
Rx 11/25/20 #90 3RF- should have RF on file ?Requested Prescriptions  ?Pending Prescriptions Disp Refills  ?? omeprazole (PRILOSEC) 20 MG capsule 90 capsule 3  ?  Sig: Take 1 capsule (20 mg total) by mouth daily.  ?  ? Gastroenterology: Proton Pump Inhibitors Passed - 05/09/2021 12:48 PM  ?  ?  Passed - Valid encounter within last 12 months  ?  Recent Outpatient Visits   ?      ? 7 months ago Hordeolum externum of left upper eyelid  ? Dante, Charlane Ferretti, MD  ? 8 months ago Puncture wound of right foot, initial encounter  ? [place] Charlott Rakes, MD  ? 1 year ago Furuncle  ? [place], Charlane Ferretti, MD  ? 1 year ago Upper respiratory tract infection, unspecified type  ? [place], NP  ? 1 year ago Upper respiratory tract infection, unspecified type  ? [place] Charlott Rakes, MD  ?  ?  ?Future Appointments   ?        ? In 5 months Raulkar, Clide Deutscher, MD [facility], CPR  ?  ? ?  ?  ?  ? ?

## 2021-05-19 DIAGNOSIS — Z419 Encounter for procedure for purposes other than remedying health state, unspecified: Secondary | ICD-10-CM | POA: Diagnosis not present

## 2021-05-20 ENCOUNTER — Encounter: Payer: Self-pay | Admitting: Nurse Practitioner

## 2021-05-20 ENCOUNTER — Telehealth (HOSPITAL_BASED_OUTPATIENT_CLINIC_OR_DEPARTMENT_OTHER): Payer: Medicaid Other | Admitting: Nurse Practitioner

## 2021-05-20 ENCOUNTER — Encounter: Payer: Self-pay | Admitting: Family Medicine

## 2021-05-20 ENCOUNTER — Other Ambulatory Visit: Payer: Self-pay | Admitting: Family Medicine

## 2021-05-20 DIAGNOSIS — K219 Gastro-esophageal reflux disease without esophagitis: Secondary | ICD-10-CM

## 2021-05-20 DIAGNOSIS — G43009 Migraine without aura, not intractable, without status migrainosus: Secondary | ICD-10-CM

## 2021-05-20 DIAGNOSIS — E039 Hypothyroidism, unspecified: Secondary | ICD-10-CM

## 2021-05-20 MED ORDER — TOPIRAMATE 25 MG PO TABS: 25.0000 mg | ORAL_TABLET | Freq: Every day | ORAL | 0 refills | Status: DC

## 2021-05-20 NOTE — Progress Notes (Signed)
Virtual Visit Note ? I discussed the limitations, risks, security and privacy concerns of performing an evaluation and management service by video and the availability of in person appointments. I also discussed with the patient that there may be a patient responsible charge related to this service. The patient expressed understanding and agreed to proceed.  ? ? ?I connected with Kimberly [name] on 05/20/21  at   2:50 PM EDT  EDT by VIDEO and verified that I am speaking with the correct person using two identifiers. ? ? ?Location of Patient: ?Private Residence ?  ?Location of Provider: ?Scientist, research (physical sciences) and [employer] Office  ?  ?Persons participating in VIRTUAL visit: ?Geryl Rankins FNP-BC ?Kimberly [name]  ?  ?History of Present Illness: ?VIRTUAL visit for: Migraines and omeprazole refills ? ?GERD ?She is requesting refill of omeprazole however after speaking with pharmacist at [facility] she has several refills already available. Symptoms of GERD are well controlled with omeprazole daily however she has been out for several days now. Symptoms of GERD include bilious reflux.  ? ?Migraines ?She has a history of chronic migraines in the past controlled with tylenol and naproxen. Over the past few months migraines have started occurring more frequently and previous treatments are now ineffective. Headaches occurring several days out of the week with no prodromal symptoms. Associated with nausea. She denies any recent acute visual changes.  ? ? ?Past Medical History:  ?Diagnosis Date  ? Acanthosis nigricans, acquired 03/01/2011  ? Anxiety   ? Asthma   ? "grew out of it" (11/16/2012)  ? Chronic back pain   ? Complication of anesthesia   ? "takes a lot to put me to sleep; woke up completely mid endoscopy" (11/16/2012)  ? Depression   ? Dysrhythmia   ? ST (11/16/2012)  ? Gastric reflux   ? 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 Iron deficiency anemia   ? Migraines   ? "weekly" (11/16/2012)  ?  Oligomenorrhea 03/01/2011  ? Ovarian cyst   ? Pregnancy induced hypertension   ? Schizophrenia ([place])   ? "moderate" (11/16/2012)  ? Scoliosis   ?  ?Past Surgical History:  ?Procedure Laterality Date  ? CESAREAN SECTION N/A 08/02/2019  ? Procedure: CESAREAN SECTION;  Surgeon: Gwynne Edinger, MD;  Location: [facility];  Service: Obstetrics;  Laterality: N/A;  ? LAPAROSCOPIC OVARIAN CYSTECTOMY Left 02/11/2016  ? Procedure: LAPAROSCOPIC OVARIAN CYSTECTOMY;  Surgeon: Emily Filbert, MD;  Location: [facility];  Service: Gynecology;  Laterality: Left;  ? UPPER GI ENDOSCOPY  ~ 2006; ~2008  ?  ?Family History  ?Problem Relation Age of Onset  ? Diabetes Mother   ? Hypertension Mother   ? Vision loss Mother   ? Hypertension Father   ? Hyperlipidemia Father   ? Thyroid disease Maternal Grandmother   ?  ?Social History  ? ?Socioeconomic History  ? Marital status: Single  ?  Spouse name: Not on file  ? Number of children: 1  ? Years of education: Not on file  ? Highest education level: High school graduate  ?Occupational History  ? Not on file  ?Tobacco Use  ? Smoking status: Never  ? Smokeless tobacco: Never  ?Vaping Use  ? Vaping Use: Never used  ?Substance and Sexual Activity  ? Alcohol use: Not Currently  ?  Comment: very rare  ? Drug use: No  ? Sexual activity: Yes  ?  Birth control/protection: None  ?Other Topics Concern  ? Not on file  ?  Social History Narrative  ? Not on file  ? ?Social Determinants of Health  ? ?Financial Resource Strain: Not on file  ?Food Insecurity: Not on file  ?Transportation Needs: Not on file  ?Physical Activity: Not on file  ?Stress: Not on file  ?Social Connections: Not on file  ?  ? ?Observations/Objective: ?Awake, alert and oriented x 3 ? ? ?Review of Systems  ?Constitutional:  Negative for fever, malaise/fatigue and weight loss.  ?HENT: Negative.  Negative for nosebleeds.   ?Eyes: Negative.  Negative for blurred vision, double vision and photophobia.  ?Respiratory: Negative.  Negative for cough and  shortness of breath.   ?Cardiovascular: Negative.  Negative for chest pain, palpitations and leg swelling.  ?Gastrointestinal:  Positive for heartburn. Negative for nausea and vomiting.  ?Musculoskeletal: Negative.  Negative for myalgias.  ?Neurological:  Positive for headaches. Negative for dizziness, focal weakness and seizures.  ?Psychiatric/Behavioral: Negative.  Negative for suicidal ideas.    ?Assessment and Plan: ?Diagnoses and all orders for this visit: ? ?Migraine without aura and without status migrainosus, not intractable ?-     topiramate (TOPAMAX) 25 MG tablet; Take 1 tablet (25 mg total) by mouth daily. May increase to 2 tablets after 2 weeks if needed ?Avoid caffeine or migraine triggers.  ? ?GERD without esophagitis ?I s/w pharmacist. She has an entire year of refills available at the pharmacy from 11-2020 through 11-2021. They will get prescription ready for pick up tomorrow.  ? ?  ? ?Follow Up Instructions ?Return if symptoms worsen or fail to improve.  ?Needs to follow up with PCP for timing of labwork. TSH overdue since 2021 ? ?  ?I discussed the assessment and treatment plan with the patient. The patient was provided an opportunity to ask questions and all were answered. The patient agreed with the plan and demonstrated an understanding of the instructions. ?  ?The patient was advised to call back or seek an in-person evaluation if the symptoms worsen or if the condition fails to improve as anticipated. ? ?I provided 12 minutes of face-to-face time during this encounter including median intraservice time, reviewing previous notes, labs, imaging, medications and explaining diagnosis and management. ? ?Gildardo Pounds, FNP-BC  ?

## 2021-06-02 ENCOUNTER — Encounter: Payer: Self-pay | Admitting: Obstetrics and Gynecology

## 2021-06-02 ENCOUNTER — Encounter: Payer: Self-pay | Admitting: Family Medicine

## 2021-06-02 NOTE — Telephone Encounter (Signed)
Pt has been scheduled a virtual appt with Dr. Margarita Rana for 130pm.  ?

## 2021-06-03 ENCOUNTER — Ambulatory Visit: Payer: Medicaid Other | Attending: Family Medicine

## 2021-06-03 ENCOUNTER — Ambulatory Visit (HOSPITAL_BASED_OUTPATIENT_CLINIC_OR_DEPARTMENT_OTHER): Payer: Medicaid Other | Admitting: Family Medicine

## 2021-06-03 DIAGNOSIS — G43009 Migraine without aura, not intractable, without status migrainosus: Secondary | ICD-10-CM | POA: Diagnosis not present

## 2021-06-03 NOTE — Progress Notes (Signed)
? ?Virtual Visit via Video Note ? ?I connected with Kimberly [name], on 06/03/2021 at 1:29 PM by video enabled telemedicine device and verified that I am speaking with the correct person using two identifiers. ?  ?Consent: ?I discussed the limitations, risks, security and privacy concerns of performing an evaluation and management service by telemedicine and the availability of in person appointments. I also discussed with the patient that there may be a patient responsible charge related to this service. The patient expressed understanding and agreed to proceed. ? ? ?Location of Patient: ?Home ? ?Location of Provider: ?Clinic ? ? ?Persons participating in Telemedicine visit: ?LATRESA GASSER ?Dr. Margarita Rana ? ? ? ? ?History of Present Illness: ?Kimberly [name] is a 31 y.o. year old female  with a history of anxiety and depression, hypothyroidism, chronic low back pain, COVID-19 in 07/2019 who presents for an acute visit. ?  ?She was seen by the nurse practitioner for migraines last month and placed on Topamax. ?She complains Topamax makes her groggy.  She had previously sent a MyChart message requesting to go on medication as needed for migraines but today informs me she would just like to be referred to neurology. ? ?Migraines occur everyday and has gotten worse. Migraines were intermittent but in the last month it has started occurring everyday.  She currently uses ibuprofen Tylenol for her symptoms and if she is able to take it as she feels a migraine coming on symptoms are controlled but if she does not take it on time symptoms are severe. ?Past Medical History:  ?Diagnosis Date  ? Acanthosis nigricans, acquired 03/01/2011  ? Anxiety   ? Asthma   ? "grew out of it" (11/16/2012)  ? Chronic back pain   ? Complication of anesthesia   ? "takes a lot to put me to sleep; woke up completely mid endoscopy" (11/16/2012)  ? Depression   ? Dysrhythmia   ? ST (11/16/2012)  ? Gastric reflux   ? Hypothyroidism due to defective  thyroid hormonogenesis   ? Iron deficiency anemia   ? Migraines   ? "weekly" (11/16/2012)  ? Oligomenorrhea 03/01/2011  ? Ovarian cyst   ? Pregnancy induced hypertension   ? Schizophrenia ([place])   ? "moderate" (11/16/2012)  ? Scoliosis   ? ?Allergies  ?Allergen Reactions  ? Cold & Cough Daytime [Acetaminophen-Dm] Shortness Of Breath  ?  Could not tolerate- orange liquid, may have contained Pseudoephedrine- "I could not tolerate"  ? Sulfa Antibiotics Swelling  ? Zofran [Ondansetron] Other (See Comments)  ?  Muscle Spasms  ? Abilify [Aripiprazole] Other (See Comments)  ?  Lethargy, unable to function  ? Amoxicillin-Pot Clavulanate Other (See Comments)  ?  Stomach pains ?Has patient had a PCN reaction causing immediate rash, facial/tongue/throat swelling, SOB or lightheadedness with hypotension: No ?Has patient had a PCN reaction causing severe rash involving mucus membranes or skin necrosis: No ?Has patient had a PCN reaction that required hospitalization No ?Has patient had a PCN reaction occurring within the last 10 years: Yes ?If all of the above answers are "NO", then may proceed with Cephalosporin use. ?  ? Cephalexin Other (See Comments)  ?  Stomach pains ?Pt taking Cefadroxil as outpatient  ? Latex Swelling and Other (See Comments)  ?  Burning of skin, too  ? Phenergan [Promethazine]   ?  'gave me twitches' 'not able to sit still'  ? Tape Other (See Comments)  ?  Swelling, burning of skin .   ?  Zicam Cold Remedy [Homeopathic Products] Nausea And Vomiting  ? Keflex [Cephalexin] Rash and Other (See Comments)  ?  Rash--able to take w/ Benadryl  ? Vicodin [Hydrocodone-Acetaminophen] Hives and Rash  ?  Per pt- makes her very sleepy, feels like she isn't getting her breath. States she does not have throat/tongue swelling or anaphylaxis.  ? ? ?Current Outpatient Medications on File Prior to Visit  ?Medication Sig Dispense Refill  ? acetaminophen (TYLENOL) 325 MG tablet Take 650 mg by mouth every 6 (six) hours as needed.     ? FLUoxetine (PROZAC) 20 MG capsule TAKE 1 CAPSULE(20 MG) BY MOUTH DAILY 90 capsule 0  ? fluticasone (FLONASE) 50 MCG/ACT nasal spray Place 2 sprays into both nostrils daily. 16 g 1  ? hydrOXYzine (ATARAX) 25 MG tablet Take 1 tablet (25 mg total) by mouth 3 (three) times daily as needed. 180 tablet 0  ? levothyroxine (SYNTHROID) 75 MCG tablet Take 1 tablet (75 mcg total) by mouth daily before breakfast. 90 tablet 1  ? meclizine (ANTIVERT) 25 MG tablet Take 1 tablet (25 mg total) by mouth 3 (three) times daily as needed for dizziness. 90 tablet 0  ? omeprazole (PRILOSEC) 20 MG capsule Take 1 capsule (20 mg total) by mouth daily. 90 capsule 3  ? oxyCODONE-acetaminophen (PERCOCET/ROXICET) 5-325 MG tablet Take 1 tablet by mouth every 6 (six) hours as needed for moderate pain. 10 tablet 0  ? sulfamethoxazole-trimethoprim (BACTRIM DS) 800-160 MG tablet Take 1 tablet by mouth 2 (two) times daily. 20 tablet 0  ? topiramate (TOPAMAX) 25 MG tablet Take 1 tablet (25 mg total) by mouth daily. May increase to 2 tablets after 2 weeks if needed 90 tablet 0  ? ?No current facility-administered medications on file prior to visit.  ? ? ?ROS: ?See HPI ? ?Observations/Objective: ?Awake, alert, oriented x3 ?Not in acute distress ?Normal mood ? ? ?  Latest Ref Rng & Units 10/23/2020  ?  5:39 AM 08/10/2019  ?  5:24 AM 08/04/2019  ?  7:00 AM  ?CMP  ?Glucose 70 - 99 mg/dL 115   101   86    ?BUN 6 - 20 mg/dL '12   5   8    '$ ?Creatinine 0.44 - 1.00 mg/dL 0.36   0.53   0.54    ?Sodium 135 - 145 mmol/L 140   138   141    ?Potassium 3.5 - 5.1 mmol/L 3.9   2.8   3.4    ?Chloride 98 - 111 mmol/L 104   99   108    ?CO2 22 - 32 mmol/L '25   26   23    '$ ?Calcium 8.9 - 10.3 mg/dL 9.4   8.0   8.5    ?Total Protein 6.5 - 8.1 g/dL 7.7   4.7   4.9    ?Total Bilirubin 0.3 - 1.2 mg/dL 0.5   1.3   0.3    ?Alkaline Phos 38 - 126 U/L 83   113   105    ?AST 15 - 41 U/L '15   21   20    '$ ?ALT 0 - 44 U/L '14   18   12    '$ ? ? ?Lipid Panel  ?   ?Component Value Date/Time   ? CHOL 178 06/08/2016 0936  ? TRIG 237 (H) 06/08/2016 0936  ? HDL 41 06/08/2016 0936  ? CHOLHDL 4.3 06/08/2016 0936  ? CHOLHDL 4.4 08/07/2013 1141  ? VLDL 29 08/07/2013 1141  ?  [place] 90 06/08/2016 0936  ? LABVLDL 47 (H) 06/08/2016 0936  ? ? ?Lab Results  ?Component Value Date  ? HGBA1C 4.9 06/28/2019  ? ? ? ?Assessment and Plan: ?1. Migraine without aura and without status migrainosus, not intractable ?Due to frequency of migraines she will benefit from a prophylactic medication rather than an as needed medication ?I have discussed with her some available options and adverse effects but she declines and would rather be evaluated by neurology in the event that " something else is going on" causing an increase in frequency of her migraines ?Advised to continue with her NSAIDs and I will place a referral for her. ?Advised to discontinue Topamax due to intolerance ?- Ambulatory referral to Neurology ? ? ?Follow Up Instructions: ?Keep previously scheduled appointment ?  ?I discussed the assessment and treatment plan with the patient. The patient was provided an opportunity to ask questions and all were answered. The patient agreed with the plan and demonstrated an understanding of the instructions. ?  ?The patient was advised to call back or seek an in-person evaluation if the symptoms worsen or if the condition fails to improve as anticipated. ? ? ? ? ?I provided 15 minutes total of Telehealth time during this encounter including median intraservice time, reviewing previous notes, investigations, ordering medications, medical decision making, coordinating care and patient verbalized understanding at the end of the visit. ? ? ? ? ?Charlott Rakes, MD, FAAFP. ?[place] ?[place], [place] ?[phone]   ?06/03/2021, 1:29 PM ? ?

## 2021-06-05 ENCOUNTER — Ambulatory Visit (HOSPITAL_COMMUNITY): Payer: Medicaid Other

## 2021-06-11 ENCOUNTER — Encounter: Payer: Self-pay | Admitting: Obstetrics and Gynecology

## 2021-06-19 DIAGNOSIS — Z419 Encounter for procedure for purposes other than remedying health state, unspecified: Secondary | ICD-10-CM | POA: Diagnosis not present

## 2021-06-20 ENCOUNTER — Other Ambulatory Visit: Payer: Self-pay | Admitting: Obstetrics and Gynecology

## 2021-06-20 ENCOUNTER — Ambulatory Visit (HOSPITAL_COMMUNITY)
Admission: RE | Admit: 2021-06-20 | Discharge: 2021-06-20 | Disposition: A | Discharge status: Home / Self Care | Payer: Medicaid Other | Source: Ambulatory Visit | Attending: Obstetrics and Gynecology | Admitting: Obstetrics and Gynecology

## 2021-06-20 DIAGNOSIS — N83202 Unspecified ovarian cyst, left side: Secondary | ICD-10-CM | POA: Diagnosis not present

## 2021-06-25 ENCOUNTER — Ambulatory Visit: Payer: Medicaid Other | Admitting: Physician Assistant

## 2021-06-25 ENCOUNTER — Ambulatory Visit: Payer: Self-pay | Admitting: *Deleted

## 2021-06-25 ENCOUNTER — Encounter: Payer: Self-pay | Admitting: Physician Assistant

## 2021-06-25 ENCOUNTER — Telehealth: Payer: Self-pay

## 2021-06-25 ENCOUNTER — Ambulatory Visit: Payer: Medicaid Other | Admitting: Family Medicine

## 2021-06-25 DIAGNOSIS — R112 Nausea with vomiting, unspecified: Secondary | ICD-10-CM | POA: Diagnosis not present

## 2021-06-25 DIAGNOSIS — R195 Other fecal abnormalities: Secondary | ICD-10-CM

## 2021-06-25 DIAGNOSIS — G43009 Migraine without aura, not intractable, without status migrainosus: Secondary | ICD-10-CM | POA: Diagnosis not present

## 2021-06-25 MED ORDER — BUTALBITAL-APAP-CAFFEINE 50-325-40 MG PO TABS: 1.0000 | ORAL_TABLET | Freq: Four times a day (QID) | ORAL | 0 refills | Status: DC | PRN

## 2021-06-25 MED ORDER — PROMETHAZINE HCL 25 MG PO TABS: 25.0000 mg | ORAL_TABLET | Freq: Three times a day (TID) | ORAL | 0 refills | Status: DC | PRN

## 2021-06-25 NOTE — Progress Notes (Signed)
Established Patient Office Visit  Subjective   Patient ID: Kimberly [name], female    DOB: 09/27/1990  Age: 31 y.o. MRN: [id number]  Chief Complaint  Patient presents with   Headache   Virtual Visit via Telephone Note  I connected with [name] on 06/25/21 at  4:00 PM EDT by telephone and verified that I am speaking with the correct person using two identifiers.  Location: Patient: car  Provider: [facility]    I discussed the limitations, risks, security and privacy concerns of performing an evaluation and management service by telephone and the availability of in person appointments. I also discussed with the patient that there may be a patient responsible charge related to this service. The patient expressed understanding and agreed to proceed.   History of Present Illness: States that she has been having on and off migraine headaches for the past 3 to 4 days.  States that she also has been experiencing nausea and vomiting along with loose stools.  States that she has been able to continue staying hydrated, but is having difficulty eating.  States that she has a history of migraine headaches, states that she tried the acetaminophen and naproxen combo without relief.  States that she had some Phenergan tablets left over and took them twice with relief, but states that they have run out.  States that her mother told her that she tested positive for hep a after presenting to her provider with the same symptoms.  Otherwise no known sick contacts.   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    Iron deficiency anemia    Migraines    "weekly" (11/16/2012)   Oligomenorrhea 03/01/2011   Ovarian cyst    Pregnancy induced hypertension    Schizophrenia ([place])    "moderate" (11/16/2012)   Scoliosis    Social History   Socioeconomic History   Marital status: Single    Spouse name: Not on file   Number of children: 1   Years of education: Not on file   Highest education level: High school graduate  Occupational History   Not on file  Tobacco Use   Smoking status: Never   Smokeless tobacco: Never  Vaping Use   Vaping Use: Never used  Substance and Sexual Activity   Alcohol use: Not Currently    Comment: very rare   Drug use: No   Sexual activity: Yes    Birth control/protection: None  Other Topics Concern   Not on file  Social History Narrative   Not on file   Social Determinants of Health   Financial Resource Strain: Not on file  Food Insecurity: Not on file  Transportation Needs: Not on file  Physical Activity: Not on file  Stress: Not on file  Social Connections: Not on file  Intimate Partner Violence: Not on file   Family History  Problem Relation Age of Onset   Diabetes Mother    Hypertension Mother  Vision loss Mother    Hypertension Father    Hyperlipidemia Father    Thyroid disease Maternal Grandmother    Allergies  Allergen Reactions   Cold & Cough Daytime [Acetaminophen-Dm] Shortness Of Breath    Could not tolerate- orange liquid, may have contained Pseudoephedrine- "I could not tolerate"   Sulfa Antibiotics Swelling   Zofran [Ondansetron] Other (See Comments)    Muscle Spasms   Abilify [Aripiprazole] Other (See Comments)    Lethargy, unable to function   Amoxicillin-Pot Clavulanate Other (See Comments)    Stomach pains Has patient had a PCN reaction causing immediate rash, facial/tongue/throat swelling, SOB or lightheadedness with hypotension:  No Has patient had a PCN reaction causing severe rash involving mucus membranes or skin necrosis: No Has patient had a PCN reaction that required hospitalization No Has patient had a PCN reaction occurring within the last 10 years: Yes If all of the above answers are "NO", then may proceed with Cephalosporin use.    Cephalexin Other (See Comments)    Stomach pains Pt taking Cefadroxil as outpatient   Latex Swelling and Other (See Comments)    Burning of skin, too   Phenergan [Promethazine]     'gave me twitches' 'not able to sit still'   Tape Other (See Comments)    Swelling, burning of skin .    Zicam Cold Remedy [Homeopathic Products] Nausea And Vomiting   Keflex [Cephalexin] Rash and Other (See Comments)    Rash--able to take w/ Benadryl   Vicodin [Hydrocodone-Acetaminophen] Hives and Rash    Per pt- makes her very sleepy, feels like she isn't getting her breath. States she does not have throat/tongue swelling or anaphylaxis.    Review of Systems  Constitutional:  Negative for chills and fever.  HENT:  Negative for congestion and sore throat.   Eyes:  Positive for photophobia.  Respiratory:  Negative for cough, shortness of breath and wheezing.   Cardiovascular:  Negative for chest pain.  Gastrointestinal:  Positive for diarrhea, nausea and vomiting.  Genitourinary: Negative.   Musculoskeletal: Negative.   Skin: Negative.   Neurological:  Positive for headaches. Negative for dizziness, seizures and weakness.  Endo/Heme/Allergies: Negative.   Psychiatric/Behavioral: Negative.         Assessment & Plan:   Problem List Items Addressed This Visit   None Visit Diagnoses     Migraine without aura and without status migrainosus, not intractable    -  Primary   Relevant Medications   butalbital-acetaminophen-caffeine (FIORICET) 50-325-40 MG tablet   Nausea and vomiting, unspecified vomiting type       Relevant Medications   promethazine (PHENERGAN) 25 MG tablet   Loose  stools         Assessment and Plan: 1. Migraine without aura and without status migrainosus, not intractable Trial Fioricet.  Patient states that she is able to tolerate acetaminophen.  Check of [place] controlled substance registry appropriate.  Patient education given on supportive care, red flags given for prompt reevaluation.  Patient given schedule for mobile unit and encouraged to return next week if she wants to stay completed for hepatitis A.  Patient understands and agrees - butalbital-acetaminophen-caffeine (FIORICET) 50-325-40 MG tablet; Take 1 tablet by mouth every 6 (six) hours as needed for headache.  Dispense: 14 tablet; Refill: 0  2. Nausea and vomiting, unspecified vomiting type Continue promethazine.  Patient states that she has allergy to IV Phenergan not oral Phenergan. - promethazine (PHENERGAN) 25 MG tablet; Take 1 tablet (25  mg total) by mouth every 8 (eight) hours as needed for nausea or vomiting.  Dispense: 10 tablet; Refill: 0  3. Loose stools    Follow Up Instructions:    I discussed the assessment and treatment plan with the patient. The patient was provided an opportunity to ask questions and all were answered. The patient agreed with the plan and demonstrated an understanding of the instructions.   The patient was advised to call back or seek an in-person evaluation if the symptoms worsen or if the condition fails to improve as anticipated.  I provided 18 minutes of non-face-to-face time during this encounter.   Loraine Grip Mayers, PA-C

## 2021-06-25 NOTE — Telephone Encounter (Signed)
  Chief Complaint: vomiting, diarrhea Symptoms: vomiting, diarrhea, headache Frequency: 3-4 days Pertinent Negatives: Patient denies fever Disposition: '[]'$ ED /'[]'$ Urgent Care (no appt availability in office) / '[x]'$ Appointment(In office/virtual)/ '[]'$  [facility]/ '[]'$ Home Care/ '[]'$ Refused Recommended Disposition /'[]'$  Mobile Bus/ '[]'$  Follow-up with PCP Additional Notes:     Mother has Hep A- same symptoms as patient. Reason for Disposition  [1] MILD or MODERATE vomiting AND [2] present > 48 hours (2 days) (Exception: mild vomiting with associated diarrhea)  Answer Assessment - Initial Assessment Questions 1. VOMITING SEVERITY: "How many times have you vomited in the past 24 hours?"     - MILD:  1 - 2 times/day    - MODERATE: 3 - 5 times/day, decreased oral intake without significant weight loss or symptoms of dehydration    - SEVERE: 6 or more times/day, vomits everything or nearly everything, with significant weight loss, symptoms of dehydration      moderate 2. ONSET: "When did the vomiting begin?"      3-4 days 3. FLUIDS: "What fluids or food have you vomited up today?" "Have you been able to keep any fluids down?"     Fluids -yes, food - not so much 4. ABDOMINAL PAIN: "Are your having any abdominal pain?" If yes : "How bad is it and what does it feel like?" (e.g., crampy, dull, intermittent, constant)      no 5. DIARRHEA: "Is there any diarrhea?" If Yes, ask: "How many times today?"      Yes- 2 times today 6. CONTACTS: "Is there anyone else in the family with the same symptoms?"      Mother - Hep A 7. CAUSE: "What do you think is causing your vomiting?"     Not sure 8. HYDRATION STATUS: "Any signs of dehydration?" (e.g., dry mouth [not only dry lips], too weak to stand) "When did you last urinate?"     No- 20 minutes ago 9. OTHER SYMPTOMS: "Do you have any other symptoms?" (e.g., fever, headache, vertigo, vomiting blood or coffee grounds, recent head injury)      Headache, chills 10. PREGNANCY: "Is there any chance you are pregnant?" "When was your last menstrual period?"  Protocols used: Vomiting-A-AH

## 2021-06-25 NOTE — Telephone Encounter (Signed)
Pt missed appt with PCP. No available appts with PCP or [facility] until July. Advised pt to go to [facility]. Pt provided address, hours of operation and no appt needed. Pt verbalized thanks and understanding.

## 2021-06-25 NOTE — Patient Instructions (Signed)
You are going to take Fioricet every 6 hours as needed to help you control your migraines.  You can use the Phenergan for nausea.  I encourage you to continue increasing your hydration and work on increasing your nutrition.  I hope that you feel better soon.  Please let us know if there is any else we can do for you  Kimberly Rad, PA-C Physician Assistant [email or web address]   Migraine Headache A migraine headache is an intense, throbbing pain on one side or both sides of the head. Migraine headaches may also cause other symptoms, such as nausea, vomiting, and sensitivity to light and noise. A migraine headache can last from 4 hours to 3 days. Talk with your doctor about what things may bring on (trigger) your migraine headaches. What are the causes? The exact cause of this condition is not known. However, a migraine may be caused when nerves in the brain become irritated and release chemicals that cause inflammation of blood vessels. This inflammation causes pain. This condition may be triggered or caused by: Drinking alcohol. Smoking. Taking medicines, such as: Medicine used to treat chest pain (nitroglycerin). Birth control pills. Estrogen. Certain blood pressure medicines. Eating or drinking products that contain nitrates, glutamate, aspartame, or tyramine. Aged cheeses, chocolate, or caffeine may also be triggers. Doing physical activity. Other things that may trigger a migraine headache include: Menstruation. Pregnancy. Hunger. Stress. Lack of sleep or too much sleep. Weather changes. Fatigue. What increases the risk? The following factors may make you more likely to experience migraine headaches: Being a certain age. This condition is more common in people who are 32-13 years old. Being female. Having a family history of migraine headaches. Being Caucasian. Having a mental health condition, such as  depression or anxiety. Being obese. What are the signs or symptoms? The main symptom of this condition is pulsating or throbbing pain. This pain may: Happen in any area of the head, such as on one side or both sides. Interfere with daily activities. Get worse with physical activity. Get worse with exposure to bright lights or loud noises. Other symptoms may include: Nausea. Vomiting. Dizziness. General sensitivity to bright lights, loud noises, or smells. Before you get a migraine headache, you may get warning signs (an aura). An aura may include: Seeing flashing lights or having blind spots. Seeing bright spots, halos, or zigzag lines. Having tunnel vision or blurred vision. Having numbness or a tingling feeling. Having trouble talking. Having muscle weakness. Some people have symptoms after a migraine headache (postdromal phase), such as: Feeling tired. Difficulty concentrating. How is this diagnosed? A migraine headache can be diagnosed based on: Your symptoms. A physical exam. Tests, such as: CT scan or an MRI of the head. These imaging tests can help rule out other causes of headaches. Taking fluid from the spine (lumbar puncture) and analyzing it (cerebrospinal fluid analysis, or CSF analysis). How is this treated? This condition may be treated with medicines that: Relieve pain. Relieve nausea. Prevent migraine headaches. Treatment for this condition may also include: Acupuncture. Lifestyle changes like avoiding foods that trigger migraine headaches. Biofeedback. Cognitive behavioral therapy. Follow these instructions at home: Medicines Take over-the-counter and prescription medicines only as told by your health care provider. Ask your health care provider if the medicine prescribed to you: Requires you to avoid driving or using heavy machinery. Can cause constipation. You may need to take these actions to prevent or treat constipation: Drink enough fluid to keep  your  urine pale yellow. Take over-the-counter or prescription medicines. Eat foods that are high in fiber, such as beans, whole grains, and fresh fruits and vegetables. Limit foods that are high in fat and processed sugars, such as fried or sweet foods. Lifestyle Do not drink alcohol. Do not use any products that contain nicotine or tobacco, such as cigarettes, e-cigarettes, and chewing tobacco. If you need help quitting, ask your health care provider. Get at least 8 hours of sleep every night. Find ways to manage stress, such as meditation, deep breathing, or yoga. General instructions     Keep a journal to find out what may trigger your migraine headaches. For example, write down: What you eat and drink. How much sleep you get. Any change to your diet or medicines. If you have a migraine headache: Avoid things that make your symptoms worse, such as bright lights. It may help to lie down in a dark, quiet room. Do not drive or use heavy machinery. Ask your health care provider what activities are safe for you while you are experiencing symptoms. Keep all follow-up visits as told by your health care provider. This is important. Contact a health care provider if: You develop symptoms that are different or more severe than your usual migraine headache symptoms. You have more than 15 headache days in one month. Get help right away if: Your migraine headache becomes severe. Your migraine headache lasts longer than 72 hours. You have a fever. You have a stiff neck. You have vision loss. Your muscles feel weak or like you cannot control them. You start to lose your balance often. You have trouble walking. You faint. You have a seizure. Summary A migraine headache is an intense, throbbing pain on one side or both sides of the head. Migraines may also cause other symptoms, such as nausea, vomiting, and sensitivity to light and noise. This condition may be treated with medicines and  lifestyle changes. You may also need to avoid certain things that trigger a migraine headache. Keep a journal to find out what may trigger your migraine headaches. Contact your health care provider if you have more than 15 headache days in a month or you develop symptoms that are different or more severe than your usual migraine headache symptoms. This information is not intended to replace advice given to you by your health care provider. Make sure you discuss any questions you have with your health care provider. Document Revised: 04/29/2018 Document Reviewed: 02/17/2018 Kimberly [name] Patient Education  [place].  Nausea, Adult Nausea is the feeling of having an upset stomach or that you are about to vomit. Nausea on its own is not usually a serious concern, but it may be an early sign of a more serious medical problem. As nausea gets worse, it can lead to vomiting. If vomiting develops, or if you are not able to drink enough fluids, you are at risk of becoming dehydrated. Dehydration can make you tired and thirsty, cause you to have a dry mouth, and decrease how often you urinate. Older adults and people with other diseases or a weak disease-fighting system (immune system) are at higher risk for dehydration. The main goals of treating your nausea are: To relieve your nausea. To limit repeated nausea episodes. To prevent vomiting and dehydration. Follow these instructions at home: Watch your symptoms for any changes. Tell your health care provider about them. Eating and drinking     Take an oral rehydration solution (ORS). This is a drink that  is sold at pharmacies and retail stores. Drink clear fluids slowly and in small amounts as you are able. Clear fluids include water, ice chips, low-calorie sports drinks, and fruit juice that has water added (diluted fruit juice). Eat bland, easy-to-digest foods in small amounts as you are able. These foods include bananas, applesauce, rice, lean  meats, toast, and crackers. Avoid drinking fluids that contain a lot of sugar or caffeine, such as energy drinks, sports drinks, and soda. Avoid alcohol. Avoid spicy or fatty foods. General instructions Take over-the-counter and prescription medicines only as told by your health care provider. Rest at home while you recover. Drink enough fluid to keep your urine pale yellow. Breathe slowly and deeply when you feel nauseous. Avoid smelling things that have strong odors. Wash your hands often using soap and water for at least 20 seconds. If soap and water are not available, use hand sanitizer. Make sure that everyone in your household washes their hands well and often. Keep all follow-up visits. This is important. Contact a health care provider if: Your nausea gets worse. Your nausea does not go away after two days. You vomit multiple times. You cannot drink fluids without vomiting. You have any of the following: New symptoms. A fever. A headache. Muscle cramps. A rash. Pain while urinating. You feel light-headed or dizzy. Get help right away if: You have pain in your chest, neck, arm, or jaw. You feel extremely weak or you faint. You have vomit that is bright red or looks like coffee grounds. You have bloody or black stools (feces) or stools that look like tar. You have a severe headache, a stiff neck, or both. You have severe pain, cramping, or bloating in your abdomen. You have difficulty breathing or are breathing very quickly. Your heart is beating very quickly. Your skin feels cold and clammy. You feel confused. You have signs of dehydration, such as: Dark urine, very little urine, or no urine. Cracked lips. Dry mouth. Sunken eyes. Sleepiness. Weakness. These symptoms may be an emergency. Get help right away. Call 911. Do not wait to see if the symptoms will go away. Do not drive yourself to the hospital. Summary Nausea is the feeling that you have an upset stomach  or that you are about to vomit. Nausea on its own is not usually a serious concern, but it may be an early sign of a more serious medical problem. If vomiting develops, or if you are not able to drink enough fluids, you are at risk of becoming dehydrated. Follow recommendations for eating and drinking and take over-the-counter and prescription medicines only as told by your health care provider. Contact a health care provider right away if your symptoms worsen or you have new symptoms. Keep all follow-up visits. This is important. This information is not intended to replace advice given to you by your health care provider. Make sure you discuss any questions you have with your health care provider. Document Revised: 07/12/2020 Document Reviewed: 07/12/2020 Kimberly [name] Patient Education  [place].

## 2021-07-03 ENCOUNTER — Encounter: Payer: Self-pay | Admitting: Psychiatry

## 2021-07-03 ENCOUNTER — Ambulatory Visit: Payer: Medicaid Other | Admitting: Psychiatry

## 2021-07-03 VITALS — BP 133/91 | HR 99 | Ht 64.0 in | Wt 275.0 lb

## 2021-07-03 DIAGNOSIS — R200 Anesthesia of skin: Secondary | ICD-10-CM | POA: Diagnosis not present

## 2021-07-03 DIAGNOSIS — R51 Headache with orthostatic component, not elsewhere classified: Secondary | ICD-10-CM

## 2021-07-03 DIAGNOSIS — G43009 Migraine without aura, not intractable, without status migrainosus: Secondary | ICD-10-CM | POA: Diagnosis not present

## 2021-07-03 DIAGNOSIS — M5412 Radiculopathy, cervical region: Secondary | ICD-10-CM | POA: Diagnosis not present

## 2021-07-03 MED ORDER — PROPRANOLOL HCL 20 MG PO TABS: 20.0000 mg | ORAL_TABLET | Freq: Two times a day (BID) | ORAL | 6 refills | Status: DC

## 2021-07-03 MED ORDER — RIZATRIPTAN BENZOATE 5 MG PO TBDP: 5.0000 mg | ORAL_TABLET | ORAL | 6 refills | Status: DC | PRN

## 2021-07-03 MED ORDER — LORAZEPAM 0.5 MG PO TABS: ORAL_TABLET | ORAL | 0 refills | Status: DC

## 2021-07-03 NOTE — Patient Instructions (Signed)
MRI brain and cervical spine Start propranolol 20 mg twice a day for headache prevention Start Maxalt (rizatriptan). Take at the onset of migraine. If headache recurs or does not fully resolve, you may take a second dose after 2 hours. Please avoid taking more than 2 days per week to avoid rebound headaches

## 2021-07-03 NOTE — Progress Notes (Signed)
Referring:  Charlott Rakes, MD [address],  [place] [id number]  PCP: Charlott Rakes, MD  Neurology was asked to evaluate Kimberly [name], a 31 year old female for a chief complaint of headaches.  Our recommendations of care will be communicated by shared medical record.    CC:  headaches  History provided from self  HPI:  Medical co-morbidities: anxiety, scoliosis, lumbar degenerative disc disease  The patient presents for evaluation of headaches. She has had migraines since she was 62, but one month ago she developed new left sided headaches. Headaches described as aching, sharp, and throbbing pain which starts behind her ear and radiates toward her temple. They are associated with phonophobia, nausea, and vomiting. Initially they were constant. Now they will occur every ~2 days. Headaches last about 10 hours at a time. She has also noticed pain radiating from her neck down her left arm and paresthesias in her left hand.  She has taken Tylenol and naproxen as needed which only helps a little. Tried Fioricet which knocked her out the first couple of times she took it but did help. Migraine cocktail has helped somewhat. She was prescribed Topamax but didn't take it due to concern for side effects.  Headache History: Onset: 1 month ago Triggers: none Aura: no Location: left occiput, temple Quality/Description: sharp, throbbing, aching Associated Symptoms:  Photophobia: no  Phonophobia: yes  Nausea: yes Vomiting: yes Worse with activity?: yes Duration of headaches: 10 hours  Headache days per month: 15 Headache free days per month: 15  Current Treatment: Abortive Fioricet Tylenol  Preventative none  Prior Therapies                                 Fioricet Tylenol naproxen    LABS: CBC    Component Value Date/Time   WBC 14.6 (H) 10/23/2020 0539   RBC 4.47 10/23/2020 0539   HGB 11.5 (L) 10/23/2020 0539   HGB 11.0 (L) 08/21/2019 1055    HCT 37.4 10/23/2020 0539   HCT 35.2 08/21/2019 1055   PLT 458 (H) 10/23/2020 0539   PLT 427 08/21/2019 1055   MCV 83.7 10/23/2020 0539   MCV 81 08/21/2019 1055   MCH 25.7 (L) 10/23/2020 0539   MCHC 30.7 10/23/2020 0539   RDW 14.6 10/23/2020 0539   RDW 17.6 (H) 08/21/2019 1055   LYMPHSABS 2.9 10/23/2020 0539   LYMPHSABS 1.1 08/21/2019 1055   MONOABS 0.8 10/23/2020 0539   EOSABS 0.3 10/23/2020 0539   EOSABS 0.1 08/21/2019 1055   BASOSABS 0.0 10/23/2020 0539   BASOSABS 0.0 08/21/2019 1055      Latest Ref Rng & Units 10/23/2020    5:39 AM 08/10/2019    5:24 AM 08/04/2019    7:00 AM  CMP  Glucose 70 - 99 mg/dL 115  101  86   BUN 6 - 20 mg/dL '12  5  8   '$ Creatinine 0.44 - 1.00 mg/dL 0.36  0.53  0.54   Sodium 135 - 145 mmol/L 140  138  141   Potassium 3.5 - 5.1 mmol/L 3.9  2.8  3.4   Chloride 98 - 111 mmol/L 104  99  108   CO2 22 - 32 mmol/L '25  26  23   '$ Calcium 8.9 - 10.3 mg/dL 9.4  8.0  8.5   Total Protein 6.5 - 8.1 g/dL 7.7  4.7  4.9   Total Bilirubin  0.3 - 1.2 mg/dL 0.5  1.3  0.3   Alkaline Phos 38 - 126 U/L 83  113  105   AST 15 - 41 U/L '15  21  20   '$ ALT 0 - 44 U/L '14  18  12      '$ IMAGING:  none  Current Outpatient Medications on File Prior to Visit  Medication Sig Dispense Refill   acetaminophen (TYLENOL) 325 MG tablet Take 650 mg by mouth every 6 (six) hours as needed.     butalbital-acetaminophen-caffeine (FIORICET) 50-325-40 MG tablet Take 1 tablet by mouth every 6 (six) hours as needed for headache. 14 tablet 0   FLUoxetine (PROZAC) 20 MG capsule TAKE 1 CAPSULE(20 MG) BY MOUTH DAILY 90 capsule 0   meclizine (ANTIVERT) 25 MG tablet Take 1 tablet (25 mg total) by mouth 3 (three) times daily as needed for dizziness. 90 tablet 0   omeprazole (PRILOSEC) 20 MG capsule Take 1 capsule (20 mg total) by mouth daily. 90 capsule 3   promethazine (PHENERGAN) 25 MG tablet Take 1 tablet (25 mg total) by mouth every 8 (eight) hours as needed for nausea or vomiting. 10 tablet 0    fluticasone (FLONASE) 50 MCG/ACT nasal spray Place 2 sprays into both nostrils daily. 16 g 1   hydrOXYzine (ATARAX) 25 MG tablet Take 1 tablet (25 mg total) by mouth 3 (three) times daily as needed. (Patient not taking: Reported on 07/03/2021) 180 tablet 0   levothyroxine (SYNTHROID) 75 MCG tablet Take 1 tablet (75 mcg total) by mouth daily before breakfast. (Patient not taking: Reported on 07/03/2021) 90 tablet 1   oxyCODONE-acetaminophen (PERCOCET/ROXICET) 5-325 MG tablet Take 1 tablet by mouth every 6 (six) hours as needed for moderate pain. (Patient not taking: Reported on 07/03/2021) 10 tablet 0   topiramate (TOPAMAX) 25 MG tablet Take 1 tablet (25 mg total) by mouth daily. May increase to 2 tablets after 2 weeks if needed 90 tablet 0   No current facility-administered medications on file prior to visit.     Allergies: Allergies  Allergen Reactions   Cold & Cough Daytime [Acetaminophen-Dm] Shortness Of Breath    Could not tolerate- orange liquid, may have contained Pseudoephedrine- "I could not tolerate"   Sulfa Antibiotics Swelling   Zofran [Ondansetron] Other (See Comments)    Muscle Spasms   Abilify [Aripiprazole] Other (See Comments)    Lethargy, unable to function   Amoxicillin-Pot Clavulanate Other (See Comments)    Stomach pains Has patient had a PCN reaction causing immediate rash, facial/tongue/throat swelling, SOB or lightheadedness with hypotension: No Has patient had a PCN reaction causing severe rash involving mucus membranes or skin necrosis: No Has patient had a PCN reaction that required hospitalization No Has patient had a PCN reaction occurring within the last 10 years: Yes If all of the above answers are "NO", then may proceed with Cephalosporin use.    Cephalexin Other (See Comments)    Stomach pains Pt taking Cefadroxil as outpatient   Latex Swelling and Other (See Comments)    Burning of skin, too   Phenergan [Promethazine]     'gave me twitches' 'not able to  sit still'   Tape Other (See Comments)    Swelling, burning of skin .    Zicam Cold Remedy [Homeopathic Products] Nausea And Vomiting   Keflex [Cephalexin] Rash and Other (See Comments)    Rash--able to take w/ Benadryl   Vicodin [Hydrocodone-Acetaminophen] Hives and Rash    Per pt- makes her  very sleepy, feels like she isn't getting her breath. States she does not have throat/tongue swelling or anaphylaxis.    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    Iron deficiency anemia    Migraines    "weekly" (11/16/2012)   Oligomenorrhea 03/01/2011   Ovarian cyst    Pregnancy induced hypertension    Schizophrenia ([place])    "moderate" (11/16/2012)   Scoliosis     Past Surgical History Past Surgical History:  Procedure Laterality Date   CESAREAN SECTION N/A 08/02/2019   Procedure: CESAREAN SECTION;  Surgeon: Gwynne Edinger, MD;  Location: [facility];  Service: Obstetrics;  Laterality: N/A;   LAPAROSCOPIC OVARIAN CYSTECTOMY Left 02/11/2016   Procedure: LAPAROSCOPIC OVARIAN CYSTECTOMY;  Surgeon: Emily Filbert, MD;  Location: [facility];  Service: Gynecology;  Laterality: Left;   UPPER GI ENDOSCOPY  ~ 2006; ~2008    Social History: Social History   Tobacco Use   Smoking status: Never   Smokeless tobacco: Never  Vaping Use   Vaping Use: Never used  Substance Use Topics   Alcohol use:  Not Currently   Drug use: No    ROS: Negative for fevers, chills. Positive for headaches. All other systems reviewed and negative unless stated otherwise in HPI.   Physical Exam:   Vital Signs: BP (!) 133/91   Pulse 99   Ht '5\' 4"'$  (1.626 m)   Wt 275 lb (124.7 kg)   BMI 47.20 kg/m  GENERAL: well appearing,in no acute distress,alert SKIN:  Color, texture, turgor normal. No rashes or lesions HEAD:  Normocephalic/atraumatic. CV:  RRR RESP: Normal respiratory effort MSK: +tenderness to palpation over left neck and shoulders  NEUROLOGICAL: Mental Status: Alert, oriented to person, place and time,Follows commands Cranial Nerves: PERRL, visual fields intact to confrontation, extraocular movements intact, facial sensation intact, no facial droop or ptosis, hearing grossly intact, no dysarthria Motor: muscle strength 5/5 both upper and lower extremities,no drift, normal tone Reflexes: 2+ throughout Sensation: decreased sensation/paresthesias over left hand Coordination: Finger-to- nose-finger intact bilaterally Gait: normal-based   IMPRESSION: 31 year old female with a history of anxiety, scoliosis, lumbar degenerative disc disease who presents for evaluation of new left sided headaches for the past month. She has also developed radicular pain and paresthesias down the left arm. Suspect cervicogenic headache vs occipital neuralgia. Her most severe headaches do have migrainous features. Will order MRI brain and C-spine. She reports significant anxiety and palpitations. Will start propranolol which may help with headaches and anxiety. Maxalt started for migraine rescue.  PLAN: -MRI brain, MRI C-spine -Prevention: Start propranolol 20 mg BID -Rescue: Start Maxalt 5 mg PRN   I spent a total of 30 minutes chart reviewing and counseling the patient. Headache education was done. Discussed treatment options including preventive and acute medications. Discussed  medication overuse headache and  to limit use of acute treatments to no more than 2 days/week or 10 days/month. Discussed medication side effects, adverse reactions and drug interactions. Written educational materials and patient instructions outlining all of the above were given.  Follow-up: 6 months   Genia Harold, MD 07/03/2021   8:56 AM

## 2021-07-07 ENCOUNTER — Telehealth: Payer: Medicaid Other | Admitting: Family Medicine

## 2021-07-18 ENCOUNTER — Encounter: Payer: Self-pay | Admitting: Family Medicine

## 2021-07-19 DIAGNOSIS — Z419 Encounter for procedure for purposes other than remedying health state, unspecified: Secondary | ICD-10-CM | POA: Diagnosis not present

## 2021-07-20 ENCOUNTER — Other Ambulatory Visit: Payer: Self-pay | Admitting: Family Medicine

## 2021-07-20 DIAGNOSIS — R42 Dizziness and giddiness: Secondary | ICD-10-CM

## 2021-07-28 ENCOUNTER — Encounter: Payer: Self-pay | Admitting: Psychiatry

## 2021-08-01 ENCOUNTER — Other Ambulatory Visit: Payer: Self-pay | Admitting: Family Medicine

## 2021-08-01 DIAGNOSIS — F419 Anxiety disorder, unspecified: Secondary | ICD-10-CM

## 2021-08-01 NOTE — Telephone Encounter (Signed)
Requested Prescriptions  Pending Prescriptions Disp Refills  . FLUoxetine (PROZAC) 20 MG capsule [Pharmacy Med Name: FLUOXETINE '20MG'$  CAPSULES] 90 capsule 0    Sig: TAKE 1 CAPSULE(20 MG) BY MOUTH DAILY     Psychiatry:  Antidepressants - SSRI Passed - 08/01/2021  9:09 AM      Passed - Completed PHQ-2 or PHQ-9 in the last 360 days      Passed - Valid encounter within last 6 months    Recent Outpatient Visits          1 month ago Migraine without aura and without status migrainosus, not intractable   [place], [place]ona, MD   2 months ago Migraine without aura and without status migrainosus, not intractable   [facility] [place], Vernia Buff, NP   9 months ago Hordeolum externum of left upper eyelid   [place], Enobong, MD   11 months ago Puncture wound of right foot, initial encounter   [place], Charlane Ferretti, MD   1 year ago [place], Enobong, MD      Future Appointments            In 1 month Charlott Rakes, MD [place]   In 2 months Raulkar, Clide Deutscher, MD [facility], [facility]

## 2021-08-05 ENCOUNTER — Telehealth: Payer: Self-pay | Admitting: Psychiatry

## 2021-08-05 NOTE — Telephone Encounter (Signed)
Brain medicaid [employer] Josem Kaufmann: [id number] exp. 07/24/21-09/22/21 sent to GI   Cervical medicaid well[employer] auth: [id number] exp. 07/24/21-09/22/21 sent to GI

## 2021-08-06 ENCOUNTER — Other Ambulatory Visit: Payer: Self-pay | Admitting: Family Medicine

## 2021-08-06 DIAGNOSIS — F419 Anxiety disorder, unspecified: Secondary | ICD-10-CM

## 2021-08-17 ENCOUNTER — Ambulatory Visit
Admission: RE | Admit: 2021-08-17 | Discharge: 2021-08-17 | Disposition: A | Discharge status: Home / Self Care | Payer: Medicaid Other | Source: Ambulatory Visit | Attending: Psychiatry | Admitting: Psychiatry

## 2021-08-17 DIAGNOSIS — R200 Anesthesia of skin: Secondary | ICD-10-CM

## 2021-08-17 DIAGNOSIS — R51 Headache with orthostatic component, not elsewhere classified: Secondary | ICD-10-CM

## 2021-08-17 DIAGNOSIS — M5412 Radiculopathy, cervical region: Secondary | ICD-10-CM

## 2021-08-17 MED ORDER — GADOBENATE DIMEGLUMINE 529 MG/ML IV SOLN
20.0000 mL | Freq: Once | INTRAVENOUS | Status: AC | PRN
  Administered 2021-08-17: 20 mL via INTRAVENOUS

## 2021-08-18 ENCOUNTER — Encounter: Payer: Self-pay | Admitting: Obstetrics and Gynecology

## 2021-08-18 ENCOUNTER — Telehealth: Payer: Medicaid Other | Admitting: Obstetrics and Gynecology

## 2021-08-19 DIAGNOSIS — Z419 Encounter for procedure for purposes other than remedying health state, unspecified: Secondary | ICD-10-CM | POA: Diagnosis not present

## 2021-08-20 ENCOUNTER — Other Ambulatory Visit: Payer: Self-pay | Admitting: Family Medicine

## 2021-08-20 MED ORDER — LEVOTHYROXINE SODIUM 75 MCG PO TABS: 75.0000 ug | ORAL_TABLET | Freq: Every day | ORAL | 0 refills | Status: DC

## 2021-08-28 ENCOUNTER — Ambulatory Visit: Payer: Medicaid Other | Admitting: Obstetrics and Gynecology

## 2021-09-01 ENCOUNTER — Encounter: Payer: Self-pay | Admitting: Family Medicine

## 2021-09-01 ENCOUNTER — Ambulatory Visit: Payer: Medicaid Other | Attending: Family Medicine | Admitting: Family Medicine

## 2021-09-01 VITALS — BP 124/89 | HR 96 | Temp 98.9°F | Resp 16 | Ht 64.0 in | Wt 277.0 lb

## 2021-09-01 DIAGNOSIS — E039 Hypothyroidism, unspecified: Secondary | ICD-10-CM

## 2021-09-01 DIAGNOSIS — F419 Anxiety disorder, unspecified: Secondary | ICD-10-CM | POA: Diagnosis not present

## 2021-09-01 DIAGNOSIS — R42 Dizziness and giddiness: Secondary | ICD-10-CM

## 2021-09-01 DIAGNOSIS — G8929 Other chronic pain: Secondary | ICD-10-CM

## 2021-09-01 DIAGNOSIS — F32A Depression, unspecified: Secondary | ICD-10-CM | POA: Diagnosis not present

## 2021-09-01 DIAGNOSIS — Z1159 Encounter for screening for other viral diseases: Secondary | ICD-10-CM

## 2021-09-01 DIAGNOSIS — Z6841 Body Mass Index (BMI) 40.0 and over, adult: Secondary | ICD-10-CM | POA: Diagnosis not present

## 2021-09-01 DIAGNOSIS — M545 Low back pain, unspecified: Secondary | ICD-10-CM

## 2021-09-01 DIAGNOSIS — Z8632 Personal history of gestational diabetes: Secondary | ICD-10-CM | POA: Diagnosis not present

## 2021-09-01 DIAGNOSIS — K219 Gastro-esophageal reflux disease without esophagitis: Secondary | ICD-10-CM | POA: Diagnosis not present

## 2021-09-01 MED ORDER — OMEPRAZOLE 20 MG PO CPDR: 20.0000 mg | DELAYED_RELEASE_CAPSULE | Freq: Every day | ORAL | 1 refills | Status: DC

## 2021-09-01 MED ORDER — FLUOXETINE HCL 20 MG PO CAPS: ORAL_CAPSULE | ORAL | 1 refills | Status: DC

## 2021-09-01 MED ORDER — MECLIZINE HCL 25 MG PO TABS: ORAL_TABLET | ORAL | 11 refills | Status: DC

## 2021-09-01 NOTE — Progress Notes (Signed)
Subjective:  Patient ID: Kimberly [name], female    DOB: 1990-11-03  Age: 31 y.o. MRN: [id number]  CC: Follow-up (Thyroid test. Medication refills. //Chronic back pain in low back. Radiculopathy moreso L sided)   HPI Kimberly [name] is a 31 y.o. year old female with a history of anxiety and depression, hypothyroidism, chronic low back pain, vertigo, migraines, COVID-19 in 07/2019.  Interval History:  She is unable to stand or sit for too long due to her low back pain and has filed for disability as she had to quit a couple of jobs. I referred her to a back specialist but her visit is not until next month. Pain radiates down her legs and is a 4-5/10 and can increase to a 10/10.Tylenol , Ibuprofen, Naproxen do no help. She states it has been present since she was 31.  Sometimes has tingling in her legs but they are absent now. She is unable due to exercise due to low back pain.  Endorses adherence with levothyroxine for her hypothyroidism. For anxiety and depression she is doing well on Prozac. She also takes meclizine for her vertigo.  For her migraine she is currently under the care of neurology but is yet to be commenced on medications as she was awaiting results of her MRI prior to initiating treatment. 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    Iron deficiency anemia    Migraines    "weekly" (11/16/2012)   Oligomenorrhea 03/01/2011   Ovarian cyst    Pregnancy induced hypertension    Schizophrenia ([place])    "moderate" (11/16/2012)   Scoliosis     Past Surgical History:  Procedure Laterality Date   CESAREAN SECTION N/A 08/02/2019    Procedure: CESAREAN SECTION;  Surgeon: Gwynne Edinger, MD;  Location: [facility];  Service: Obstetrics;  Laterality: N/A;   LAPAROSCOPIC OVARIAN CYSTECTOMY Left 02/11/2016   Procedure: LAPAROSCOPIC OVARIAN CYSTECTOMY;  Surgeon: Emily Filbert, MD;  Location: [facility];  Service: Gynecology;  Laterality: Left;   UPPER GI ENDOSCOPY  ~ 2006; ~2008    Family History  Problem Relation Age of Onset   Diabetes Mother    Hypertension Mother    Vision loss Mother    Hypertension Father    Hyperlipidemia Father    Thyroid disease Maternal Grandmother     Social History   Socioeconomic History   Marital status: Single    Spouse name: Not on file   Number of children: 2   Years of education: Not on file   Highest education level: High school graduate  Occupational History    Comment: at vets office  Tobacco Use   Smoking status: Never   Smokeless tobacco: Never  Vaping Use   Vaping Use: Never used  Substance and Sexual Activity   Alcohol use: Not Currently   Drug use: No   Sexual activity: Yes    Birth control/protection: None  Other Topics Concern   Not on file  Social History Narrative   Lives with children   Caffeine- maybe 1 soda a day  Social Determinants of Health   Financial Resource Strain: Not on file  Food Insecurity: No Food Insecurity (08/23/2019)   Hunger Vital Sign    Worried About Running Out of Food in the Last Year: Never true    Ran Out of Food in the Last Year: Never true  Transportation Needs: No Transportation Needs (08/23/2019)   PRAPARE - Hydrologist (Medical): No    Lack of Transportation (Non-Medical): No  Physical Activity: Not on file  Stress: Not on file  Social Connections: Not on file    Allergies  Allergen Reactions   Cold & Cough Daytime [Acetaminophen-Dm] Shortness Of Breath    Could not tolerate- orange liquid, may have contained Pseudoephedrine- "I could not tolerate"   Sulfa Antibiotics Swelling   Zofran  [Ondansetron] Other (See Comments)    Muscle Spasms   Abilify [Aripiprazole] Other (See Comments)    Lethargy, unable to function   Amoxicillin-Pot Clavulanate Other (See Comments)    Stomach pains Has patient had a PCN reaction causing immediate rash, facial/tongue/throat swelling, SOB or lightheadedness with hypotension: No Has patient had a PCN reaction causing severe rash involving mucus membranes or skin necrosis: No Has patient had a PCN reaction that required hospitalization No Has patient had a PCN reaction occurring within the last 10 years: Yes If all of the above answers are "NO", then may proceed with Cephalosporin use.    Cephalexin Other (See Comments)    Stomach pains Pt taking Cefadroxil as outpatient   Latex Swelling and Other (See Comments)    Burning of skin, too   Phenergan [Promethazine]     'gave me twitches' 'not able to sit still'   Tape Other (See Comments)    Swelling, burning of skin .    Zicam Cold Remedy [Homeopathic Products] Nausea And Vomiting   Keflex [Cephalexin] Rash and Other (See Comments)    Rash--able to take w/ Benadryl   Vicodin [Hydrocodone-Acetaminophen] Hives and Rash    Per pt- makes her very sleepy, feels like she isn't getting her breath. States she does not have throat/tongue swelling or anaphylaxis.    Outpatient Medications Prior to Visit  Medication Sig Dispense Refill   levothyroxine (SYNTHROID) 75 MCG tablet Take 1 tablet (75 mcg total) by mouth daily. 30 tablet 0   FLUoxetine (PROZAC) 20 MG capsule TAKE 1 CAPSULE(20 MG) BY MOUTH DAILY 90 capsule 0   meclizine (ANTIVERT) 25 MG tablet TAKE 1 TABLET(25 MG) BY MOUTH THREE TIMES DAILY AS NEEDED FOR DIZZINESS 90 tablet 0   omeprazole (PRILOSEC) 20 MG capsule Take 1 capsule (20 mg total) by mouth daily. 90 capsule 3   rizatriptan (MAXALT-MLT) 5 MG disintegrating tablet Take 1 tablet (5 mg total) by mouth as needed for migraine. May repeat in 2 hours if needed (Patient not taking:  Reported on 09/01/2021) 10 tablet 6   topiramate (TOPAMAX) 25 MG tablet Take 1 tablet (25 mg total) by mouth daily. May increase to 2 tablets after 2 weeks if needed 90 tablet 0   LORazepam (ATIVAN) 0.5 MG tablet Take 1-2 pills 30 minutes before MRIs (Patient not taking: Reported on 09/01/2021) 4 tablet 0   promethazine (PHENERGAN) 25 MG tablet Take 1 tablet (25 mg total) by mouth every 8 (eight) hours as needed for nausea or vomiting. (Patient not taking: Reported on 09/01/2021) 10 tablet 0   propranolol (INDERAL) 20 MG tablet Take 1 tablet (20 mg total) by mouth 2 (two) times daily. (Patient  not taking: Reported on 09/01/2021) 60 tablet 6   No facility-administered medications prior to visit.     ROS Review of Systems  Constitutional:  Negative for activity change and appetite change.  HENT:  Negative for sinus pressure and sore throat.   Respiratory:  Negative for chest tightness, shortness of breath and wheezing.   Cardiovascular:  Negative for chest pain and palpitations.  Gastrointestinal:  Negative for abdominal distention, abdominal pain and constipation.  Genitourinary: Negative.   Musculoskeletal: Negative.   Psychiatric/Behavioral:  Negative for behavioral problems and dysphoric mood.     Objective:  BP 124/89 (BP Location: Right Arm, Patient Position: Sitting, Cuff Size: Large)   Pulse 96   Temp 98.9 F (37.2 C)   Resp 16   Ht '5\' 4"'  (1.626 m)   Wt 277 lb (125.6 kg)   SpO2 96%   BMI 47.55 kg/m      09/01/2021    3:34 PM 07/03/2021    8:10 AM 01/24/2021   11:01 AM  BP/Weight  Systolic BP 254 982 641  Diastolic BP 89 91 83  Wt. (Lbs) 277 275 264.8  BMI 47.55 kg/m2 47.2 kg/m2 45.45 kg/m2      Physical Exam Constitutional:      Appearance: She is well-developed. She is obese.  Cardiovascular:     Rate and Rhythm: Tachycardia present.     Heart sounds: Normal heart sounds. No murmur heard. Pulmonary:     Effort: Pulmonary effort is normal.     Breath sounds:  Normal breath sounds. No wheezing or rales.  Chest:     Chest wall: No tenderness.  Abdominal:     General: Bowel sounds are normal. There is no distension.     Palpations: Abdomen is soft. There is no mass.     Tenderness: There is no abdominal tenderness.  Musculoskeletal:     Right lower leg: No edema.     Left lower leg: No edema.     Comments: Slight tenderness to palpation of midpoint of lumbar spine Negative straight leg raise bilaterally  Neurological:     Mental Status: She is alert and oriented to person, place, and time.  Psychiatric:        Mood and Affect: Mood normal.        Latest Ref Rng & Units 10/23/2020    5:39 AM 08/10/2019    5:24 AM 08/04/2019    7:00 AM  CMP  Glucose 70 - 99 mg/dL 115  101  86   BUN 6 - 20 mg/dL '12  5  8   ' Creatinine 0.44 - 1.00 mg/dL 0.36  0.53  0.54   Sodium 135 - 145 mmol/L 140  138  141   Potassium 3.5 - 5.1 mmol/L 3.9  2.8  3.4   Chloride 98 - 111 mmol/L 104  99  108   CO2 22 - 32 mmol/L '25  26  23   ' Calcium 8.9 - 10.3 mg/dL 9.4  8.0  8.5   Total Protein 6.5 - 8.1 g/dL 7.7  4.7  4.9   Total Bilirubin 0.3 - 1.2 mg/dL 0.5  1.3  0.3   Alkaline Phos 38 - 126 U/L 83  113  105   AST 15 - 41 U/L '15  21  20   ' ALT 0 - 44 U/L '14  18  12     ' Lipid Panel     Component Value Date/Time   CHOL 178 06/08/2016 0936   TRIG 237 (  H) 06/08/2016 0936   HDL 41 06/08/2016 0936   CHOLHDL 4.3 06/08/2016 0936   CHOLHDL 4.4 08/07/2013 1141   VLDL 29 08/07/2013 1141   LDLCALC 90 06/08/2016 0936    CBC    Component Value Date/Time   WBC 14.6 (H) 10/23/2020 0539   RBC 4.47 10/23/2020 0539   HGB 11.5 (L) 10/23/2020 0539   HGB 11.0 (L) 08/21/2019 1055   HCT 37.4 10/23/2020 0539   HCT 35.2 08/21/2019 1055   PLT 458 (H) 10/23/2020 0539   PLT 427 08/21/2019 1055   MCV 83.7 10/23/2020 0539   MCV 81 08/21/2019 1055   MCH 25.7 (L) 10/23/2020 0539   MCHC 30.7 10/23/2020 0539   RDW 14.6 10/23/2020 0539   RDW 17.6 (H) 08/21/2019 1055   LYMPHSABS  2.9 10/23/2020 0539   LYMPHSABS 1.1 08/21/2019 1055   MONOABS 0.8 10/23/2020 0539   EOSABS 0.3 10/23/2020 0539   EOSABS 0.1 08/21/2019 1055   BASOSABS 0.0 10/23/2020 0539   BASOSABS 0.0 08/21/2019 1055    Lab Results  Component Value Date   HGBA1C 4.9 06/28/2019    Assessment & Plan:  1. Anxiety and depression Controlled - FLUoxetine (PROZAC) 20 MG capsule; TAKE 1 CAPSULE(20 MG) BY MOUTH DAILY  Dispense: 90 capsule; Refill: 1  2. History of diet controlled gestational diabetes mellitus (GDM) Last A1c was 4.9 - Hemoglobin A1c - CMP14+EGFR  3. Gastroesophageal reflux disease without esophagitis Stable - omeprazole (PRILOSEC) 20 MG capsule; Take 1 capsule (20 mg total) by mouth daily.  Dispense: 90 capsule; Refill: 1  4. Vertigo Controlled - meclizine (ANTIVERT) 25 MG tablet; TAKE 1 TABLET(25 MG) BY MOUTH THREE TIMES DAILY AS NEEDED FOR DIZZINESS  Dispense: 90 tablet; Refill: 11  5. Chronic bilateral low back pain without sciatica Uncontrolled Weight loss will be beneficial Advised to apply heat or ice whichever is tolerated to painful areas. Counseled on evidence of improvement in pain control with regards to yoga, water aerobics, massage, home physical therapy, exercise as tolerated.  - Ambulatory referral to Physical Therapy  6. Screening for viral disease - HCV Ab w Reflex to Quant PCR  7. BMI 45.0-49.9, adult [facility]) She will benefit from weight loss Unfortunately she is unable to exercise due to back pain She will be a candidate for GLP-1 RA however we will need to screen her for diabetes first Offered to refer to weight management clinic but she declines  8. Hypothyroidism, unspecified type Controlled We will check thyroid labs and adjust regimen accordingly - T3, free - T4, free - TSH    Meds ordered this encounter  Medications   FLUoxetine (PROZAC) 20 MG capsule    Sig: TAKE 1 CAPSULE(20 MG) BY MOUTH DAILY    Dispense:  90 capsule    Refill:  1    omeprazole (PRILOSEC) 20 MG capsule    Sig: Take 1 capsule (20 mg total) by mouth daily.    Dispense:  90 capsule    Refill:  1   meclizine (ANTIVERT) 25 MG tablet    Sig: TAKE 1 TABLET(25 MG) BY MOUTH THREE TIMES DAILY AS NEEDED FOR DIZZINESS    Dispense:  90 tablet    Refill:  11    Follow-up: Return in about 6 months (around 03/04/2022) for Chronic medical conditions.       Charlott Rakes, MD, FAAFP. [facility] [place], [place]   09/01/2021, 4:39 PM

## 2021-09-01 NOTE — Patient Instructions (Signed)

## 2021-09-02 ENCOUNTER — Other Ambulatory Visit: Payer: Self-pay | Admitting: Family Medicine

## 2021-09-02 DIAGNOSIS — E039 Hypothyroidism, unspecified: Secondary | ICD-10-CM

## 2021-09-02 LAB — CMP14+EGFR
ALT: 19 IU/L (ref 0–32)
AST: 21 IU/L (ref 0–40)
Albumin/Globulin Ratio: 1.7 (ref 1.2–2.2)
Albumin: 4.3 g/dL (ref 4.0–5.0)
Alkaline Phosphatase: 92 IU/L (ref 44–121)
BUN/Creatinine Ratio: 18 (ref 9–23)
BUN: 9 mg/dL (ref 6–20)
Bilirubin Total: 0.5 mg/dL (ref 0.0–1.2)
CO2: 19 mmol/L — ABNORMAL LOW (ref 20–29)
Calcium: 9.4 mg/dL (ref 8.7–10.2)
Chloride: 104 mmol/L (ref 96–106)
Creatinine, Ser: 0.51 mg/dL — ABNORMAL LOW (ref 0.57–1.00)
Globulin, Total: 2.5 g/dL (ref 1.5–4.5)
Glucose: 135 mg/dL — ABNORMAL HIGH (ref 70–99)
Potassium: 4 mmol/L (ref 3.5–5.2)
Sodium: 141 mmol/L (ref 134–144)
Total Protein: 6.8 g/dL (ref 6.0–8.5)
eGFR: 129 mL/min/{1.73_m2} (ref >59)

## 2021-09-02 LAB — HCV AB W REFLEX TO QUANT PCR: HCV Ab: NONREACTIVE

## 2021-09-02 LAB — HEMOGLOBIN A1C
Est. average glucose Bld gHb Est-mCnc: 100 mg/dL
Hgb A1c MFr Bld: 5.1 % (ref 4.8–5.6)

## 2021-09-02 LAB — HCV INTERPRETATION

## 2021-09-02 LAB — TSH: TSH: 8.77 u[IU]/mL — ABNORMAL HIGH (ref 0.450–4.500)

## 2021-09-02 LAB — T4, FREE: Free T4: 1.05 ng/dL (ref 0.82–1.77)

## 2021-09-02 LAB — T3, FREE: T3, Free: 2.9 pg/mL (ref 2.0–4.4)

## 2021-09-02 MED ORDER — LEVOTHYROXINE SODIUM 88 MCG PO TABS: 88.0000 ug | ORAL_TABLET | Freq: Every day | ORAL | 3 refills | Status: DC

## 2021-09-09 ENCOUNTER — Ambulatory Visit: Payer: Self-pay | Admitting: Physical Therapy

## 2021-09-15 ENCOUNTER — Ambulatory Visit: Payer: Self-pay

## 2021-09-19 DIAGNOSIS — Z419 Encounter for procedure for purposes other than remedying health state, unspecified: Secondary | ICD-10-CM | POA: Diagnosis not present

## 2021-10-01 ENCOUNTER — Encounter: Payer: Self-pay | Admitting: Psychiatry

## 2021-10-02 NOTE — Telephone Encounter (Signed)
Yes, she should start the medications I prescribed. MRI was normal so we will focus on treating her symptoms with the medications

## 2021-10-09 ENCOUNTER — Encounter: Payer: Medicaid Other | Admitting: Physical Medicine and Rehabilitation

## 2021-10-19 DIAGNOSIS — Z419 Encounter for procedure for purposes other than remedying health state, unspecified: Secondary | ICD-10-CM | POA: Diagnosis not present

## 2021-10-22 ENCOUNTER — Encounter (HOSPITAL_COMMUNITY): Payer: Medicaid Other | Admitting: Psychiatry

## 2021-10-22 NOTE — Progress Notes (Unsigned)
Psychiatric Initial Adult Assessment  Patient Identification: Kimberly [name] MRN:  [id number] Date of Evaluation:  10/22/2021 Referral Source: Charlott Rakes, MD  Assessment:  Kimberly [name] is a 31 y.o. female with a history of anxiety, depression, hypothyroidism, chronic low back pain, and migraines *** who presents to [facility] via video conferencing for initial evaluation of ***.  Patient reports ***  Plan:  # MDD  Anxiety Past medication trials:  Status of problem: *** Interventions: -- *** -- Prozac 20 mg daily -- Patient prescribed propranolol 20 mg BID by neurology for migraines  # *** Past medication trials:  Status of problem: *** Interventions: -- ***  # *** Past medication trials:  Status of problem: *** Interventions: -- ***  Patient was given contact information for behavioral health clinic and was instructed to call 911 for emergencies.   Subjective:  Chief Complaint: No chief complaint on file.   History of Present Illness:  ***   Past Psychiatric History:  Diagnoses: *** anxiety, depression Medication trials: *** Prozac, Buspar Previous psychiatrist/therapist: *** Hospitalizations: ***denies Suicide attempts: *** denies SIB: *** Hx of violence towards others: *** Current access to guns: *** Hx of abuse: ***  Previous Psychotropic Medications: {YES/NO:21197}  Substance Abuse History in the last 12 months:  {yes no:314532}  Past Medical History:  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    Iron deficiency anemia    Migraines    "weekly" (11/16/2012)    Oligomenorrhea 03/01/2011   Ovarian cyst    Pregnancy induced hypertension    Schizophrenia ([place])    "moderate" (11/16/2012)   Scoliosis     Past Surgical History:  Procedure Laterality Date   CESAREAN SECTION N/A 08/02/2019   Procedure: CESAREAN SECTION;  Surgeon: Gwynne Edinger, MD;  Location: [facility];  Service: Obstetrics;  Laterality: N/A;   LAPAROSCOPIC OVARIAN CYSTECTOMY Left 02/11/2016   Procedure: LAPAROSCOPIC OVARIAN CYSTECTOMY;  Surgeon: Emily Filbert, MD;  Location: [facility];  Service: Gynecology;  Laterality: Left;   UPPER GI ENDOSCOPY  ~ 2006; ~2008    Family Psychiatric History: ***  Family History:  Family History  Problem Relation Age of Onset   Diabetes Mother    Hypertension Mother    Vision loss Mother    Hypertension Father    Hyperlipidemia Father    Thyroid disease Maternal Grandmother     Social History:   Social History   Socioeconomic History   Marital status: Single    Spouse name: Not on file   Number of children: 2   Years of education: Not on file   Highest education level: High school graduate  Occupational History    Comment: at vets office  Tobacco Use   Smoking status: Never   Smokeless tobacco: Never  Vaping Use   Vaping Use: Never used  Substance and Sexual Activity   Alcohol use: Not Currently   Drug use: No   Sexual activity: Yes    Birth control/protection: None  Other Topics Concern   Not on file  Social History  Narrative   Lives with children   Caffeine- maybe 1 soda a day   Social Determinants of Health   Financial Resource Strain: Not on file  Food Insecurity: No Food Insecurity (08/23/2019)   Hunger Vital Sign    Worried About Running Out of Food in the Last Year: Never true    Ran Out of Food in the Last Year: Never true  Transportation Needs: No Transportation Needs (08/23/2019)   PRAPARE - Hydrologist (Medical): No    Lack of Transportation (Non-Medical): No  Physical Activity: Not  on file  Stress: Not on file  Social Connections: Not on file    Additional Social History: ***  Allergies:   Allergies  Allergen Reactions   Cold & Cough Daytime [Acetaminophen-Dm] Shortness Of Breath    Could not tolerate- orange liquid, may have contained Pseudoephedrine- "I could not tolerate"   Sulfa Antibiotics Swelling   Zofran [Ondansetron] Other (See Comments)    Muscle Spasms   Abilify [Aripiprazole] Other (See Comments)    Lethargy, unable to function   Amoxicillin-Pot Clavulanate Other (See Comments)    Stomach pains Has patient had a PCN reaction causing immediate rash, facial/tongue/throat swelling, SOB or lightheadedness with hypotension: No Has patient had a PCN reaction causing severe rash involving mucus membranes or skin necrosis: No Has patient had a PCN reaction that required hospitalization No Has patient had a PCN reaction occurring within the last 10 years: Yes If all of the above answers are "NO", then may proceed with Cephalosporin use.    Cephalexin Other (See Comments)    Stomach pains Pt taking Cefadroxil as outpatient   Latex Swelling and Other (See Comments)    Burning of skin, too   Phenergan [Promethazine]     'gave me twitches' 'not able to sit still'   Tape Other (See Comments)    Swelling, burning of skin .    Zicam Cold Remedy [Homeopathic Products] Nausea And Vomiting   Keflex [Cephalexin] Rash and Other (See Comments)    Rash--able to take w/ Benadryl   Vicodin [Hydrocodone-Acetaminophen] Hives and Rash    Per pt- makes her very sleepy, feels like she isn't getting her breath. States she does not have throat/tongue swelling or anaphylaxis.    Current Medications: Current Outpatient Medications  Medication Sig Dispense Refill   FLUoxetine (PROZAC) 20 MG capsule TAKE 1 CAPSULE(20 MG) BY MOUTH DAILY 90 capsule 1   levothyroxine (SYNTHROID) 88 MCG tablet Take 1 tablet (88 mcg total) by mouth daily. 30 tablet 3   meclizine (ANTIVERT) 25  MG tablet TAKE 1 TABLET(25 MG) BY MOUTH THREE TIMES DAILY AS NEEDED FOR DIZZINESS 90 tablet 11   omeprazole (PRILOSEC) 20 MG capsule Take 1 capsule (20 mg total) by mouth daily. 90 capsule 1   rizatriptan (MAXALT-MLT) 5 MG disintegrating tablet Take 1 tablet (5 mg total) by mouth as needed for migraine. May repeat in 2 hours if needed (Patient not taking: Reported on 09/01/2021) 10 tablet 6   topiramate (TOPAMAX) 25 MG tablet Take 1 tablet (25 mg total) by mouth daily. May increase to 2 tablets after 2 weeks if needed 90 tablet 0   No current facility-administered medications for this visit.    ROS: Review of Systems  Objective:  Psychiatric Specialty Exam: There were no vitals taken for this visit.There is no height or weight on file to calculate BMI.  General Appearance: {Appearance:22683}  Eye Contact:  {BHH EYE CONTACT:22684}  Speech:  {  Speech:22685}  Volume:  {Volume (PAA):22686}  Mood:  {BHH MOOD:22306}  Affect:  {Affect (PAA):22687}  Thought Content: {Thought Content:22690}   Suicidal Thoughts:  {ST/HT (PAA):22692}  Homicidal Thoughts:  {ST/HT (PAA):22692}  Thought Process:  {Thought Process (PAA):22688}  Orientation:  {BHH ORIENTATION (PAA):22689}    Memory:  {BHH MEMORY:22881}  Judgment:  {Judgement (PAA):22694}  Insight:  {Insight (PAA):22695}  Concentration:  {Concentration:21399}  Recall:  {BHH GOOD/FAIR/POOR:22877}  Fund of Knowledge: {BHH GOOD/FAIR/POOR:22877}  Language: {BHH GOOD/FAIR/POOR:22877}  Psychomotor Activity:  {Psychomotor (PAA):22696}  Akathisia:  {BHH YES OR NO:22294}  AIMS (if indicated): {Desc; done/not:10129}  Assets:  {Assets (PAA):22698}  ADL's:  {BHH DJS'H:70263}  Cognition: {chl bhh cognition:304700322}  Sleep:  {BHH GOOD/FAIR/POOR:22877}   PE: General: sits comfortably in view of camera; no acute distress *** Pulm: no increased work of breathing on room air *** MSK: all extremity movements appear intact *** Neuro: no focal neurological  deficits observed *** Gait & Station: unable to assess by video ***   Metabolic Disorder Labs: Lab Results  Component Value Date   HGBA1C 5.1 09/01/2021   MPG 103 02/25/2012   No results found for: "PROLACTIN" Lab Results  Component Value Date   CHOL 178 06/08/2016   TRIG 237 (H) 06/08/2016   HDL 41 06/08/2016   CHOLHDL 4.3 06/08/2016   VLDL 29 08/07/2013   LDLCALC 90 06/08/2016   LDLCALC 114 (H) 08/07/2013   Lab Results  Component Value Date   TSH 8.770 (H) 09/01/2021    Therapeutic Level Labs: No results found for: "LITHIUM" No results found for: "CBMZ" No results found for: "VALPROATE"  Screenings:  GAD-7    Flowsheet Row Office Visit from 09/01/2021 in [place] Office Visit from 01/24/2021 in Center for [facility] at [facility] for [facility] Office Visit from 10/24/2019 in [place] Postpartum Visit from 09/15/2019 in [facility] for [employer] at [facility] for [place] from 08/30/2019 in [facility] for [place] at [facility] for Women  Total GAD-7 Score '10 7 3 3 1      '$ PHQ2-9    [place] Office Visit from 09/01/2021 in [place] Office Visit from 01/24/2021 in [facility] for [place] at [facility] for [facility] Office Visit from 10/24/2019 in [place] Postpartum Visit from 09/15/2019 in [facility] for [employer] at [facility] for [place] from 08/30/2019 in [facility] for [place] at [facility] for Women  PHQ-2 Total Score '4 3 1 '$ 0 0  PHQ-9 Total Score '12 9 2 1 1      '$ Flowsheet Row ED from 10/23/2020 in [place] DEPT  C-SSRS RISK CATEGORY No Risk       Collaboration of Care: Collaboration of Care: [facility] OP Collaboration of [facility]:[id number]}  Patient/Guardian was advised  Release of Information must be obtained prior to any record release in order to collaborate their care with an outside provider. Patient/Guardian was advised if they have not already done so to contact the registration department to sign all necessary forms in order for [place] to release information regarding their care.   Consent: Patient/Guardian gives verbal consent for treatment and assignment of benefits for services provided during this visit. Patient/Guardian expressed understanding and agreed to proceed.   Televisit via video: I connected with [name] on 10/22/21 at  2:00 PM EDT by a  video enabled telemedicine application and verified that I am speaking with the correct person using two identifiers.  Location: Patient: *** Provider: remote in [place]   I discussed the limitations of evaluation and management by telemedicine and the availability of in person appointments. The patient expressed understanding and agreed to proceed.  I discussed the assessment and treatment plan with the patient. The patient was provided an opportunity to ask questions and all were answered. The patient agreed with the plan and demonstrated an understanding of the instructions.   The patient was advised to call back or seek an in-person evaluation if the symptoms worsen or if the condition fails to improve as anticipated.  I provided *** minutes of non-face-to-face time during this encounter.  Natallia Stellmach A  10/4/20238:19 AM

## 2021-10-23 ENCOUNTER — Encounter: Payer: Self-pay | Admitting: Family Medicine

## 2021-11-01 ENCOUNTER — Other Ambulatory Visit: Payer: Self-pay

## 2021-11-01 ENCOUNTER — Encounter (HOSPITAL_COMMUNITY): Payer: Self-pay | Admitting: *Deleted

## 2021-11-01 ENCOUNTER — Emergency Department (HOSPITAL_COMMUNITY)
Admission: EM | Admit: 2021-11-01 | Discharge: 2021-11-02 | Disposition: A | Discharge status: Home / Self Care | Payer: Medicaid Other | Attending: Emergency Medicine | Admitting: Emergency Medicine

## 2021-11-01 DIAGNOSIS — Z91040 Latex allergy status: Secondary | ICD-10-CM | POA: Insufficient documentation

## 2021-11-01 DIAGNOSIS — N939 Abnormal uterine and vaginal bleeding, unspecified: Secondary | ICD-10-CM | POA: Insufficient documentation

## 2021-11-01 DIAGNOSIS — Z743 Need for continuous supervision: Secondary | ICD-10-CM | POA: Diagnosis not present

## 2021-11-01 DIAGNOSIS — D72829 Elevated white blood cell count, unspecified: Secondary | ICD-10-CM | POA: Insufficient documentation

## 2021-11-01 DIAGNOSIS — N83202 Unspecified ovarian cyst, left side: Secondary | ICD-10-CM | POA: Diagnosis not present

## 2021-11-01 DIAGNOSIS — R58 Hemorrhage, not elsewhere classified: Secondary | ICD-10-CM | POA: Diagnosis not present

## 2021-11-01 DIAGNOSIS — R42 Dizziness and giddiness: Secondary | ICD-10-CM | POA: Diagnosis not present

## 2021-11-01 DIAGNOSIS — R4589 Other symptoms and signs involving emotional state: Secondary | ICD-10-CM | POA: Diagnosis not present

## 2021-11-01 DIAGNOSIS — R000 Tachycardia, unspecified: Secondary | ICD-10-CM | POA: Diagnosis not present

## 2021-11-01 DIAGNOSIS — N888 Other specified noninflammatory disorders of cervix uteri: Secondary | ICD-10-CM | POA: Diagnosis not present

## 2021-11-01 LAB — CBC
HCT: 36.6 % (ref 36.0–46.0)
Hemoglobin: 10.9 g/dL — ABNORMAL LOW (ref 12.0–15.0)
MCH: 24.1 pg — ABNORMAL LOW (ref 26.0–34.0)
MCHC: 29.8 g/dL — ABNORMAL LOW (ref 30.0–36.0)
MCV: 81 fL (ref 80.0–100.0)
Platelets: 439 10*3/uL — ABNORMAL HIGH (ref 150–400)
RBC: 4.52 MIL/uL (ref 3.87–5.11)
RDW: 14.6 % (ref 11.5–15.5)
WBC: 13.4 10*3/uL — ABNORMAL HIGH (ref 4.0–10.5)
nRBC: 0 % (ref 0.0–0.2)

## 2021-11-01 LAB — I-STAT BETA HCG BLOOD, ED (MC, WL, AP ONLY): I-stat hCG, quantitative: <5 m[IU]/mL (ref <5)

## 2021-11-01 NOTE — ED Triage Notes (Signed)
Pt arrived via EMS with vaginal bleeding. Started period today on time and then later today had large clot come out. Since then everytime she stands up blood gushes out. G-2 P-2.  Pt denies any pain. History of ovarian cysts

## 2021-11-02 ENCOUNTER — Emergency Department (HOSPITAL_COMMUNITY): Payer: Medicaid Other

## 2021-11-02 DIAGNOSIS — N888 Other specified noninflammatory disorders of cervix uteri: Secondary | ICD-10-CM | POA: Diagnosis not present

## 2021-11-02 DIAGNOSIS — N939 Abnormal uterine and vaginal bleeding, unspecified: Secondary | ICD-10-CM | POA: Diagnosis not present

## 2021-11-02 DIAGNOSIS — N83202 Unspecified ovarian cyst, left side: Secondary | ICD-10-CM | POA: Diagnosis not present

## 2021-11-02 LAB — URINALYSIS, ROUTINE W REFLEX MICROSCOPIC
Bacteria, UA: NONE SEEN
Bilirubin Urine: NEGATIVE
Glucose, UA: NEGATIVE mg/dL
Ketones, ur: NEGATIVE mg/dL
Nitrite: NEGATIVE
Protein, ur: 30 mg/dL — AB
RBC / HPF: >50 RBC/hpf — ABNORMAL HIGH (ref 0–5)
Specific Gravity, Urine: 1.026 (ref 1.005–1.030)
pH: 5 (ref 5.0–8.0)

## 2021-11-02 LAB — BASIC METABOLIC PANEL
Anion gap: 8 (ref 5–15)
BUN: 8 mg/dL (ref 6–20)
CO2: 25 mmol/L (ref 22–32)
Calcium: 9.1 mg/dL (ref 8.9–10.3)
Chloride: 104 mmol/L (ref 98–111)
Creatinine, Ser: 0.57 mg/dL (ref 0.44–1.00)
GFR, Estimated: >60 mL/min (ref >60)
Glucose, Bld: 92 mg/dL (ref 70–99)
Potassium: 3.5 mmol/L (ref 3.5–5.1)
Sodium: 137 mmol/L (ref 135–145)

## 2021-11-02 NOTE — ED Provider Notes (Signed)
[facility] EMERGENCY DEPARTMENT Provider Note   CSN: [id number] Arrival date & time: 11/01/21  2108     History  Chief Complaint  Patient presents with   Vaginal Bleeding    Kimberly [name] is a 31 y.o. female [phone] with a PMHx of PCOS who presents to the ED complaining of vaginal bleeding onset yesterday. Notes that she believes that she started her menstrual cycle as she is due for it. 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 Has used 3 super tampons since yesterday. No meds tried PTA. Denies abdominal pain, nausea, vomiting, dizziness, lightheadedness, dysuria. Denies history of heavy periods. Pt denies any blood clots since the last 3 tampon/pad changes.    The history is provided by the patient. No language interpreter was used.       Home Medications Prior to Admission medications   Medication Sig Start Date End Date Taking? Authorizing Provider  FLUoxetine (PROZAC) 20 MG capsule TAKE 1 CAPSULE(20 MG) BY MOUTH DAILY 09/01/21   Charlott Rakes, MD  levothyroxine (SYNTHROID) 88 MCG tablet Take 1 tablet (88 mcg total) by mouth daily. 09/02/21   Charlott Rakes, MD  meclizine (ANTIVERT) 25 MG tablet TAKE 1 TABLET(25 MG) BY MOUTH THREE TIMES DAILY AS NEEDED FOR DIZZINESS 09/01/21   Charlott Rakes, MD  omeprazole (PRILOSEC) 20 MG capsule Take 1 capsule (20 mg total) by mouth daily. 09/01/21   Charlott Rakes, MD  rizatriptan (MAXALT-MLT) 5 MG disintegrating tablet Take 1 tablet (5 mg total) by mouth as needed for migraine. May repeat in 2 hours if needed Patient not taking: Reported on 09/01/2021 07/03/21   Genia Harold, MD  topiramate (TOPAMAX) 25 MG tablet Take 1 tablet (25 mg total) by mouth daily. May increase to 2 tablets after 2 weeks if needed 05/20/21 08/18/21  Gildardo Pounds, NP      Allergies    Cold & cough daytime [acetaminophen-dm], Sulfa antibiotics, Zofran [ondansetron], Abilify [aripiprazole], Amoxicillin-pot clavulanate, Cephalexin, Latex, Phenergan  [promethazine], Tape, Zicam cold remedy [homeopathic products], Keflex [cephalexin], and Vicodin [hydrocodone-acetaminophen]    Review of Systems   Review of Systems  Gastrointestinal:  Negative for abdominal pain, nausea and vomiting.  Genitourinary:  Positive for vaginal bleeding. Negative for dysuria.  Neurological:  Negative for dizziness and light-headedness.  All other systems reviewed and are negative.   Physical Exam Updated Vital Signs BP 118/85 (BP Location: Right Arm)   Pulse 97   Temp 98.5 F (36.9 C) (Oral)   Resp 16   SpO2 100%  Physical Exam Vitals and nursing note reviewed. Exam conducted with a chaperone present.  Constitutional:      General: She is not in acute distress.    Appearance: Normal appearance. She is not ill-appearing, toxic-appearing or diaphoretic.  HENT:     Head: Normocephalic and atraumatic.     Right Ear: External ear normal.     Left Ear: External ear normal.  Eyes:     General: No scleral icterus.    Extraocular Movements: Extraocular movements intact.  Cardiovascular:     Rate and Rhythm: Normal rate and regular rhythm.     Pulses: Normal pulses.     Heart sounds: Normal heart sounds.  Pulmonary:     Effort: Pulmonary effort is normal. No respiratory distress.     Breath sounds: Normal breath sounds.  Abdominal:     General: Abdomen is flat. Bowel sounds are normal. There is no distension.     Palpations: Abdomen is soft. There is  no mass.     Tenderness: There is no abdominal tenderness.     Hernia: There is no hernia in the left inguinal area or right inguinal area.     Comments: No abdominal TTP.  Genitourinary:    Pubic Area: No rash.      Labia:        Right: No rash, tenderness, lesion or injury.        Left: No rash, tenderness, lesion or injury.      Vagina: No signs of injury and foreign body. Bleeding present. No vaginal discharge, erythema or tenderness.     Cervix: Normal.     Uterus: Normal. Not deviated, not  enlarged, not fixed and not tender.      Adnexa: Right adnexa normal and left adnexa normal.     Comments: RN chaperone present for exam. Bleeding noted to vaginal vault without visible clots noted. No TTP noted to bilateral adnexa or on exam.  Musculoskeletal:        General: Normal range of motion.     Cervical back: Normal range of motion and neck supple.  Lymphadenopathy:     Lower Body: No right inguinal adenopathy. No left inguinal adenopathy.  Skin:    General: Skin is warm and dry.  Neurological:     Mental Status: She is alert.     ED Results / Procedures / Treatments   Labs (all labs ordered are listed, but only abnormal results are displayed) Labs Reviewed  CBC - Abnormal; Notable for the following components:      Result Value   WBC 13.4 (*)    Hemoglobin 10.9 (*)    MCH 24.1 (*)    MCHC 29.8 (*)    Platelets 439 (*)    All other components within normal limits  URINALYSIS, ROUTINE W REFLEX MICROSCOPIC - Abnormal; Notable for the following components:   Color, Urine AMBER (*)    APPearance CLOUDY (*)    Hgb urine dipstick LARGE (*)    Protein, ur 30 (*)    Leukocytes,Ua SMALL (*)    RBC / HPF >50 (*)    All other components within normal limits  BASIC METABOLIC PANEL  I-STAT BETA HCG BLOOD, ED ([facility], [facility], [facility] ONLY)    EKG None  Radiology US PELVIC COMPLETE WITH TRANSVAGINAL  Result Date: 11/02/2021 CLINICAL DATA:  Vaginal bleeding. EXAM: TRANSABDOMINAL AND TRANSVAGINAL ULTRASOUND OF PELVIS TECHNIQUE: Both transabdominal and transvaginal ultrasound examinations of the pelvis were performed. Transabdominal technique was performed for global imaging of the pelvis including uterus, ovaries, adnexal regions, and pelvic cul-de-sac. It was necessary to proceed with endovaginal exam following the transabdominal exam to visualize the uterus, endometrium and ovaries/adnexa to better advantage. COMPARISON:  06/20/2021. FINDINGS: Uterus Measurements: 9.5 x 4.9 x 6.3 cm =  volume: 152.5 mL. Heterogeneous myometrial echogenicity. No discrete mass. Cervical nabothian cysts. Endometrium Thickness: 13 mm. No definite focal abnormality visualized. On 3D imaging, there is a questionable small polyp in the lower endometrial canal, not visible on standard grayscale. Right ovary Measurements: 5.3 x 2.0 x 4.1 cm = volume: 22.1 mL. Normal appearance/no adnexal mass. Positive color Doppler blood flow. Left ovary Measurements: 13.9 x 9.5 x 14.8 cm = volume: 1017.6 mL. Presumed left ovary is predominantly a large simple appearing cyst measuring 12.2 x 8.5 x 13.2 cm. This measured 11.9 x 10.3 x 9.3 cm on the previous ultrasound. Positive color Doppler flow around the periphery of this cyst. Other findings No abnormal free  fluid. IMPRESSION: 1. Heterogeneous appearance of the myometrium, which could reflect adenomyosis. 2. Equivocal small lower endometrial canal polyp, only evident on 3D imaging. Consider further evaluation with sonohysterogram for confirmation prior to hysteroscopy. Endometrial sampling should also be considered if patient is at high risk for endometrial carcinoma. (Ref: Radiological Reasoning: Algorithmic Workup of Abnormal Vaginal Bleeding with Endovaginal Sonography and Sonohysterography. AJR 2008; 240:X73-53) 3. Simple appearing left adnexal/ovarian cyst, 13.2 cm in greatest dimension, measuring slightly larger than the previous ultrasound. Recommend GYN consult and consider pelvis MRI wo and w contrast if clinically warranted. Note: This recommendation does not apply to premenarchal patients or to those with increased risk (genetic, family history, elevated tumor markers or other high-risk factors) of ovarian cancer. Reference: Radiology 2019 Nov; 293(2):359-371. 4. No other abnormalities. Electronically Signed   By: Lajean Manes M.D.   On: 11/02/2021 10:56    Procedures Procedures    Medications Ordered in ED Medications - No data to display  ED Course/ Medical  Decision Making/ A&P Clinical Course as of 11/02/21 1213  Sun Nov 02, 2021  1109 Consult with OBGYN, Dr. Elonda Husky who recommends follow up in the outpatient setting. [SB]  1122 Discuss with patient discharge treatment plan. Answered all available questions. Pt appears safe for discharge. [SB]    Clinical Course User Index [SB] Delshon Blanchfield A, PA-C                           Medical Decision Making Amount and/or Complexity of Data Reviewed Labs: ordered. Radiology: ordered.   Pt presents with vaginal bleeding onset yesterday. She notes that she had blood clots and was concerned that she was bleeding out. Denies dizziness, lightheadedness. She does have an OBGYN specialist. Vital signs, pt afebrile, not tachycardic or hypoxic. On exam, pt with RN chaperone present for exam. Bleeding noted to vaginal vault without visible clots noted. No TTP noted to bilateral adnexa or on exam. No acute cardiovascular, respiratory, abdominal exam findings. Differential diagnosis includes ectopic pregnancy, menorrhagia, AUB, foreign body.   Co morbidities that complicate the patient evaluation: PCOS  Additional history obtained:  Additional history obtained from Parent External records from outside source obtained and reviewed including: Previous ultrasound on 06/20/2021 noted left ovarian cyst measuring 11.9 x 10.3 x 9.3 cm.  Labs:  I ordered, and personally interpreted labs.  The pertinent results include:   CBC with leukocytosis at 13.4, leukocytosis likely in the setting of increased stress.  Patient notes that she was panicking due to the vaginal bleeding.  No concerns for infectious etiology at this time, patient asymptomatic. I-stat beta hCG negative BMP unremarkable Urinalysis notable for large amount of hemoglobin, otherwise unremarkable  Imaging: I ordered imaging studies including Pelvic US and transvaginal US I independently visualized and interpreted imaging which showed:  1. Heterogeneous  appearance of the myometrium, which could reflect  adenomyosis.  2. Equivocal small lower endometrial canal polyp, only evident on 3D  imaging.   3. Simple appearing left adnexal/ovarian cyst, 13.2 cm in greatest  dimension, measuring slightly larger than the previous ultrasound.  4. No other abnormalities.   I agree with the radiologist interpretation  Consultations: I requested consultation with the OB-GYN, Dr. Elonda Husky and discussed lab and imaging findings as well as pertinent plan - they recommend: follow up in the outpatient setting.  Disposition: Presentation suspicious for vaginal bleeding, likely in the setting of patient's history of PCOS.  Vaginal bleeding likely due to patient  starting her menstrual cycle.  Increasing clots likely due to patient's incidental finding of endometrial polyp on ultrasound.  Doubt IUP, ectopic pregnancy, foreign body at this time. After consideration of the diagnostic results and the patients response to treatment, I feel that the patient would benefit from Discharge home.  Strict instructions with patient as well as mother at bedside on importance of following up with the OB/GYN specialist this week regarding today's ED visit for further evaluation and management of findings on ultrasound study.  Supportive care measures and strict return precautions discussed with patient at bedside. Pt acknowledges and verbalizes understanding. Pt appears safe for discharge. Follow up as indicated in discharge paperwork.    This chart was dictated using voice recognition software, Dragon. Despite the best efforts of this provider to proofread and correct errors, errors may still occur which can change documentation meaning.   Final Clinical Impression(s) / ED Diagnoses Final diagnoses:  Vaginal bleeding    Rx / DC Orders ED Discharge Orders     None         Daryn Hicks A, PA-C 11/02/21 1213    Ezequiel Essex, MD 11/02/21 [phone]

## 2021-11-02 NOTE — ED Notes (Signed)
Patient Alert and oriented to baseline. Stable and ambulatory to baseline. Patient verbalized understanding of the discharge instructions.  Patient belongings were taken by the patient.   

## 2021-11-02 NOTE — Discharge Instructions (Addendum)
It was a pleasure taking care of you today!   Your labs didn't show any concerning findings today. Your ultrasound showed concerns for a cyst to your left ovary that was noted on previous ultrasounds. It also showed concerns for a polyp to your endometrium (inner lining of uterus), this will require further workup with your OBGYN specialist. It is important that you call your OBGYN specialist in the morning to set up a follow up appointment regarding todays ED visit. Return to the ED if you are experiencing increasing/worsening symptoms.

## 2021-11-03 ENCOUNTER — Encounter: Payer: Self-pay | Admitting: Obstetrics and Gynecology

## 2021-11-11 NOTE — Progress Notes (Unsigned)
Psychiatric Initial Adult Assessment  Patient Identification: Kimberly [name] MRN:  [id number] Date of Evaluation:  11/11/2021 Referral Source: Charlott Rakes, MD  Assessment:  Kimberly [name] is a 31 y.o. female with a history of anxiety, depression, hypothyroidism, chronic low back pain, migraines, and PCOS *** who presents to [place] via video conferencing for initial evaluation of ***.  Patient reports ***  Plan:  # MDD  Anxiety Past medication trials:  Status of problem: *** Interventions: -- *** -- Prozac 20 mg daily -- Patient prescribed propranolol 20 mg BID by neurology for migraines  # *** Past medication trials:  Status of problem: *** Interventions: -- ***  # *** Past medication trials:  Status of problem: *** Interventions: -- ***  Patient was given contact information for behavioral health clinic and was instructed to call 911 for emergencies.   Subjective:  Chief Complaint: No chief complaint on file.   History of Present Illness:  ***   Past Psychiatric History:  Diagnoses: ***anxiety, depression Medication trials: ***Prozac, Buspar Previous psychiatrist/therapist: *** Hospitalizations: ***denies Suicide attempts: ***denies SIB: *** Hx of violence towards others: *** Current access to guns: *** Hx of abuse: ***  Previous Psychotropic Medications: {YES/NO:21197}  Substance Abuse History in the last 12 months:  {yes no:314532}  Past Medical History:  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    Iron deficiency anemia    Migraines    "weekly" (11/16/2012)    Oligomenorrhea 03/01/2011   Ovarian cyst    Pregnancy induced hypertension    Schizophrenia ([place])    "moderate" (11/16/2012)   Scoliosis     Past Surgical History:  Procedure Laterality Date   CESAREAN SECTION N/A 08/02/2019   Procedure: CESAREAN SECTION;  Surgeon: Gwynne Edinger, MD;  Location: [facility];  Service: Obstetrics;  Laterality: N/A;   LAPAROSCOPIC OVARIAN CYSTECTOMY Left 02/11/2016   Procedure: LAPAROSCOPIC OVARIAN CYSTECTOMY;  Surgeon: Emily Filbert, MD;  Location: [place] [facility];  Service: Gynecology;  Laterality: Left;   UPPER GI ENDOSCOPY  ~ 2006; ~2008    Family Psychiatric History: ***  Family History:  Family History  Problem Relation Age of Onset   Diabetes Mother    Hypertension Mother    Vision loss Mother    Hypertension Father    Hyperlipidemia Father    Thyroid disease Maternal Grandmother     Social History:   Social History   Socioeconomic History   Marital status: Single    Spouse name: Not on file   Number of children: 2   Years of education: Not on file   Highest education level: High school graduate  Occupational History    Comment: at vets office  Tobacco Use   Smoking status: Never   Smokeless tobacco: Never  Vaping Use   Vaping Use: Never used  Substance and Sexual Activity   Alcohol use: Not Currently   Drug use: No   Sexual activity: Yes    Birth control/protection: None  Other Topics Concern   Not on file  Social History Narrative  Lives with children   Caffeine- maybe 1 soda a day   Social Determinants of Health   Financial Resource Strain: Not on file  Food Insecurity: No Food Insecurity (08/23/2019)   Hunger Vital Sign    Worried About Running Out of Food in the Last Year: Never true    [place] in the Last Year: Never true  Transportation Needs: No Transportation Needs (08/23/2019)   PRAPARE - Hydrologist (Medical): No    Lack of Transportation (Non-Medical): No  Physical Activity: Not  on file  Stress: Not on file  Social Connections: Not on file    Additional Social History: ***  Allergies:   Allergies  Allergen Reactions   Cold & Cough Daytime [Acetaminophen-Dm] Shortness Of Breath    Could not tolerate- orange liquid, may have contained Pseudoephedrine- "I could not tolerate"   Sulfa Antibiotics Swelling   Zofran [Ondansetron] Other (See Comments)    Muscle Spasms   Abilify [Aripiprazole] Other (See Comments)    Lethargy, unable to function   Amoxicillin-Pot Clavulanate Other (See Comments)    Stomach pains Has patient had a PCN reaction causing immediate rash, facial/tongue/throat swelling, SOB or lightheadedness with hypotension: No Has patient had a PCN reaction causing severe rash involving mucus membranes or skin necrosis: No Has patient had a PCN reaction that required hospitalization No Has patient had a PCN reaction occurring within the last 10 years: Yes If all of the above answers are "NO", then may proceed with Cephalosporin use.    Cephalexin Other (See Comments)    Stomach pains Pt taking Cefadroxil as outpatient   Latex Swelling and Other (See Comments)    Burning of skin, too   Phenergan [Promethazine]     'gave me twitches' 'not able to sit still'   Tape Other (See Comments)    Swelling, burning of skin .    Zicam Cold Remedy [Homeopathic Products] Nausea And Vomiting   Keflex [Cephalexin] Rash and Other (See Comments)    Rash--able to take w/ Benadryl   Vicodin [Hydrocodone-Acetaminophen] Hives and Rash    Per pt- makes her very sleepy, feels like she isn't getting her breath. States she does not have throat/tongue swelling or anaphylaxis.    Current Medications: Current Outpatient Medications  Medication Sig Dispense Refill   FLUoxetine (PROZAC) 20 MG capsule TAKE 1 CAPSULE(20 MG) BY MOUTH DAILY 90 capsule 1   levothyroxine (SYNTHROID) 88 MCG tablet Take 1 tablet (88 mcg total) by mouth daily. 30 tablet 3   meclizine (ANTIVERT) 25  MG tablet TAKE 1 TABLET(25 MG) BY MOUTH THREE TIMES DAILY AS NEEDED FOR DIZZINESS 90 tablet 11   omeprazole (PRILOSEC) 20 MG capsule Take 1 capsule (20 mg total) by mouth daily. 90 capsule 1   rizatriptan (MAXALT-MLT) 5 MG disintegrating tablet Take 1 tablet (5 mg total) by mouth as needed for migraine. May repeat in 2 hours if needed (Patient not taking: Reported on 09/01/2021) 10 tablet 6   topiramate (TOPAMAX) 25 MG tablet Take 1 tablet (25 mg total) by mouth daily. May increase to 2 tablets after 2 weeks if needed 90 tablet 0   No current facility-administered medications for this visit.    ROS: Review of Systems  Objective:  Psychiatric Specialty Exam: There were no vitals taken for this visit.There is no height or weight on file to calculate BMI.  General Appearance: {Appearance:22683}  Eye Contact:  {BHH EYE CONTACT:22684}  Speech:  {Speech:22685}  Volume:  {Volume (PAA):22686}  Mood:  {BHH MOOD:22306}  Affect:  {Affect (PAA):22687}  Thought Content: {Thought Content:22690}   Suicidal Thoughts:  {ST/HT (PAA):22692}  Homicidal Thoughts:  {ST/HT (PAA):22692}  Thought Process:  {Thought Process (PAA):22688}  Orientation:  {BHH ORIENTATION (PAA):22689}    Memory:  {BHH MEMORY:22881}  Judgment:  {Judgement (PAA):22694}  Insight:  {Insight (PAA):22695}  Concentration:  {Concentration:21399}  Recall:  {BHH GOOD/FAIR/POOR:22877}  Fund of Knowledge: {BHH GOOD/FAIR/POOR:22877}  Language: {BHH GOOD/FAIR/POOR:22877}  Psychomotor Activity:  {Psychomotor (PAA):22696}  Akathisia:  {BHH YES OR NO:22294}  AIMS (if indicated): {Desc; done/not:10129}  Assets:  {Assets (PAA):22698}  ADL's:  {BHH FMB'W:46659}  Cognition: {chl bhh cognition:304700322}  Sleep:  {BHH GOOD/FAIR/POOR:22877}   PE: General: sits comfortably in view of camera; no acute distress *** Pulm: no increased work of breathing on room air *** MSK: all extremity movements appear intact *** Neuro: no focal neurological  deficits observed *** Gait & Station: unable to assess by video ***   Metabolic Disorder Labs: Lab Results  Component Value Date   HGBA1C 5.1 09/01/2021   MPG 103 02/25/2012   No results found for: "PROLACTIN" Lab Results  Component Value Date   CHOL 178 06/08/2016   TRIG 237 (H) 06/08/2016   HDL 41 06/08/2016   CHOLHDL 4.3 06/08/2016   VLDL 29 08/07/2013   LDLCALC 90 06/08/2016   LDLCALC 114 (H) 08/07/2013   Lab Results  Component Value Date   TSH 8.770 (H) 09/01/2021    Therapeutic Level Labs: No results found for: "LITHIUM" No results found for: "CBMZ" No results found for: "VALPROATE"  Screenings:  GAD-7    Flowsheet Row Office Visit from 09/01/2021 in [place] Office Visit from 01/24/2021 in [facility] for [employer] at [facility] Office Visit from 10/24/2019 in [place] Postpartum Visit from 09/15/2019 in [facility] [employer] at [facility] for [place] from 08/30/2019 in Center for [place] at [facility]  Total GAD-7 Score '10 7 3 3 1      '$ PHQ2-9    [place] Office Visit from 09/01/2021 in [place] Office Visit from 01/24/2021 in [facility] for [place] at [facility] Office Visit from 10/24/2019 in [place] Postpartum Visit from 09/15/2019 in [facility] [employer] at [facility] for [place] from 08/30/2019 in Center for [place] at [facility]  PHQ-2 Total Score '4 3 1 '$ 0 0  PHQ-9 Total Score '12 9 2 1 1      '$ Flowsheet Row ED from 11/01/2021 in [place] ED from 10/23/2020 in [place] DEPT  C-SSRS RISK CATEGORY No Risk No Risk       Collaboration of Care: Collaboration  of Care: [facility] OP Collaboration of DJTT:[id number]}  Patient/Guardian was advised Release of Information must be obtained prior to any record release in order to collaborate their care with an outside provider. Patient/Guardian was advised if they have not already done so to contact the registration department to sign all necessary forms in order for [place] to release information regarding their care.   Consent: Patient/Guardian gives verbal consent for treatment and assignment of benefits for services provided during this visit. Patient/Guardian expressed understanding and agreed to proceed.   Televisit via video: I connected with Kimberly Luz  [name] on 11/11/21 at  1:00 PM EDT by a video enabled telemedicine application and verified that I am speaking with the correct person using two identifiers.  Location: Patient: *** Provider: clinic   I discussed the limitations of evaluation and management by telemedicine and the availability of in person appointments. The patient expressed understanding and agreed to proceed.  I discussed the assessment and treatment plan with the patient. The patient was provided an opportunity to ask questions and all were answered. The patient agreed with the plan and demonstrated an understanding of the instructions.   The patient was advised to call back or seek an in-person evaluation if the symptoms worsen or if the condition fails to improve as anticipated.  I provided *** minutes of non-face-to-face time during this encounter.  [place] A  10/24/20239:26 PM

## 2021-11-12 ENCOUNTER — Encounter (HOSPITAL_COMMUNITY): Payer: Self-pay

## 2021-11-12 ENCOUNTER — Encounter (HOSPITAL_COMMUNITY): Payer: Medicaid Other | Admitting: Psychiatry

## 2021-11-17 ENCOUNTER — Ambulatory Visit (INDEPENDENT_AMBULATORY_CARE_PROVIDER_SITE_OTHER): Payer: Medicaid Other | Admitting: Family Medicine

## 2021-11-17 ENCOUNTER — Other Ambulatory Visit (HOSPITAL_COMMUNITY)
Admission: RE | Admit: 2021-11-17 | Discharge: 2021-11-17 | Disposition: A | Discharge status: Home / Self Care | Payer: Medicaid Other | Source: Ambulatory Visit | Attending: Family Medicine | Admitting: Family Medicine

## 2021-11-17 VITALS — BP 160/114 | HR 103 | Wt 274.0 lb

## 2021-11-17 DIAGNOSIS — N83202 Unspecified ovarian cyst, left side: Secondary | ICD-10-CM | POA: Diagnosis not present

## 2021-11-17 DIAGNOSIS — Z124 Encounter for screening for malignant neoplasm of cervix: Secondary | ICD-10-CM

## 2021-11-17 DIAGNOSIS — E071 Dyshormogenetic goiter: Secondary | ICD-10-CM

## 2021-11-17 DIAGNOSIS — N840 Polyp of corpus uteri: Secondary | ICD-10-CM

## 2021-11-17 DIAGNOSIS — N939 Abnormal uterine and vaginal bleeding, unspecified: Secondary | ICD-10-CM | POA: Diagnosis not present

## 2021-11-17 NOTE — Progress Notes (Signed)
GYNECOLOGY OFFICE VISIT NOTE  History:   Kimberly [name] is a 31 y.o. [phone] here today for AUB follow up. She denies any abnormal vaginal discharge, bleeding, pelvic pain or other concerns.  AUB for 7 days, heavy requiring depends diapers, passing blood clots. Went to ED and this has not stopped on its own. Underwent US which showed endometrial polyp and L ovarian cyst increasing in size.   Has regular cycles, lasting 3-4 days, not heavy. Has h/o PCOS and hypothyroidism. Last pap nml in 2020. No h/o abnormal pap smears.     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    Iron deficiency anemia    Migraines    "weekly" (11/16/2012)   Oligomenorrhea 03/01/2011   Ovarian cyst    Pregnancy induced hypertension    Schizophrenia ([place])    "moderate" (11/16/2012)   Scoliosis     Past Surgical History:  Procedure Laterality Date   CESAREAN SECTION N/A 08/02/2019   Procedure: CESAREAN SECTION;  Surgeon: Gwynne Edinger, MD;  [facility]: [facility];  Service: Obstetrics;  Laterality: N/A;   LAPAROSCOPIC OVARIAN CYSTECTOMY Left 02/11/2016   Procedure: LAPAROSCOPIC OVARIAN CYSTECTOMY;  Surgeon: Emily Filbert, MD;  Location: [place] [facility];  Service: Gynecology;  Laterality: Left;   UPPER GI ENDOSCOPY  ~ 2006; ~2008    The following portions of the patient's history were reviewed and updated as appropriate: allergies, current medications, past family history, past medical history, past social history, past surgical history and problem list.   Health Maintenance:  Normal pap and negative HRHPV on 2020.  Normal mammogram on n/a.   Review of Systems:   Pertinent items noted in HPI and remainder of comprehensive ROS otherwise negative.  Physical Exam:  BP (!) 160/114   Pulse (!) 103   Wt 274 lb (124.3 kg)   BMI 47.03 kg/m  CONSTITUTIONAL: Well-developed, well-nourished female in no acute distress.  HEENT:  Normocephalic, atraumatic. External right and left ear normal. No scleral icterus.  NECK: Normal range of motion, supple, no masses noted on observation SKIN: No rash noted. Not diaphoretic. No erythema. No pallor. MUSCULOSKELETAL: Normal range of motion. No edema noted. NEUROLOGIC: Alert and oriented to person, place, and time. Normal muscle tone coordination.  PSYCHIATRIC: Normal mood and affect. Normal behavior. Normal judgment and thought content. CARDIOVASCULAR: Normal heart rate noted RESPIRATORY: Effort and breath sounds normal, no problems with respiration noted ABDOMEN: No masses noted. No other overt distention noted.   PELVIC: Normal appearing external genitalia; normal urethral meatus; normal appearing vaginal mucosa and cervix.  No abnormal discharge noted.  Normal uterine size, no other palpable masses, no uterine or adnexal tenderness. Performed in the presence of a chaperone  Labs and Imaging  US PELVIC COMPLETE WITH TRANSVAGINAL  Result Date: 11/02/2021 CLINICAL DATA:  Vaginal bleeding. EXAM: TRANSABDOMINAL AND TRANSVAGINAL ULTRASOUND OF PELVIS TECHNIQUE: Both transabdominal and transvaginal ultrasound examinations of the pelvis were performed. Transabdominal technique was performed for global imaging of the pelvis including uterus, ovaries, adnexal regions, and pelvic cul-de-sac. It was necessary to proceed with  endovaginal exam following the transabdominal exam to visualize the uterus, endometrium and ovaries/adnexa to better advantage. COMPARISON:  06/20/2021. FINDINGS: Uterus Measurements: 9.5 x 4.9 x 6.3 cm = volume: 152.5 mL. Heterogeneous myometrial echogenicity. No discrete mass. Cervical nabothian cysts.  Endometrium Thickness: 13 mm. No definite focal abnormality visualized. On 3D imaging, there is a questionable small polyp in the lower endometrial canal, not visible on standard grayscale. Right ovary Measurements: 5.3 x 2.0 x 4.1 cm = volume: 22.1 mL. Normal appearance/no adnexal mass. Positive color Doppler blood flow. Left ovary Measurements: 13.9 x 9.5 x 14.8 cm = volume: 1017.6 mL. Presumed left ovary is predominantly a large simple appearing cyst measuring 12.2 x 8.5 x 13.2 cm. This measured 11.9 x 10.3 x 9.3 cm on the previous ultrasound. Positive color Doppler flow around the periphery of this cyst. Other findings No abnormal free fluid. IMPRESSION: 1. Heterogeneous appearance of the myometrium, which could reflect adenomyosis. 2. Equivocal small lower endometrial canal polyp, only evident on 3D imaging. Consider further evaluation with sonohysterogram for confirmation prior to hysteroscopy. Endometrial sampling should also be considered if patient is at high risk for endometrial carcinoma. (Ref: Radiological Reasoning: Algorithmic Workup of Abnormal Vaginal Bleeding with Endovaginal Sonography and Sonohysterography. AJR 2008; 696:E95-28) 3. Simple appearing left adnexal/ovarian cyst, 13.2 cm in greatest dimension, measuring slightly larger than the previous ultrasound. Recommend GYN consult and consider pelvis MRI wo and w contrast if clinically warranted. Note: This recommendation does not apply to premenarchal patients or to those with increased risk (genetic, family history, elevated tumor markers or other high-risk factors) of ovarian cancer. Reference: Radiology 2019 Nov; 293(2):359-371. 4. No other abnormalities. Electronically Signed   By: Lajean Manes M.D.   On: 11/02/2021 10:56      Assessment and Plan:      1. Abnormal uterine bleeding (AUB)  2. Cyst of left ovary  3. 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 Cervical cancer screening - Cytology -  PAP( [place]) - Cervicovaginal ancillary only( [place])   Pt seen for AUB. Has one episode of 7days of heavy vaginal bleeding. Went to ED for eval and noted Ovarian cyst anf   Routine preventative health maintenance measures emphasized. Please refer to After Visit Summary for other counseling recommendations.   Return in about 2 weeks (around 12/01/2021) for endometrial biopsy.     Shelda Pal, DO OB Fellow, [place] for [place] 11/17/2021 2:39 PM

## 2021-11-18 ENCOUNTER — Other Ambulatory Visit: Payer: Self-pay | Admitting: Family Medicine

## 2021-11-18 DIAGNOSIS — N939 Abnormal uterine and vaginal bleeding, unspecified: Secondary | ICD-10-CM

## 2021-11-18 LAB — TSH+FREE T4
Free T4: 1.09 ng/dL (ref 0.82–1.77)
TSH: 7.35 u[IU]/mL — ABNORMAL HIGH (ref 0.450–4.500)

## 2021-11-18 LAB — CERVICOVAGINAL ANCILLARY ONLY
Bacterial Vaginitis (gardnerella): NEGATIVE
Candida Glabrata: NEGATIVE
Candida Vaginitis: POSITIVE — AB
Chlamydia: NEGATIVE
Comment: NEGATIVE
Comment: NEGATIVE
Comment: NEGATIVE
Comment: NEGATIVE
Comment: NEGATIVE
Comment: NORMAL
Neisseria Gonorrhea: NEGATIVE
Trichomonas: NEGATIVE

## 2021-11-18 MED ORDER — LORAZEPAM 1 MG PO TABS: 1.0000 mg | ORAL_TABLET | Freq: Once | ORAL | 0 refills | Status: DC | PRN

## 2021-11-19 DIAGNOSIS — Z419 Encounter for procedure for purposes other than remedying health state, unspecified: Secondary | ICD-10-CM | POA: Diagnosis not present

## 2021-11-20 ENCOUNTER — Other Ambulatory Visit: Payer: Self-pay | Admitting: *Deleted

## 2021-11-20 ENCOUNTER — Encounter (HOSPITAL_BASED_OUTPATIENT_CLINIC_OR_DEPARTMENT_OTHER): Payer: Medicaid Other | Admitting: Family Medicine

## 2021-11-20 ENCOUNTER — Other Ambulatory Visit: Payer: Self-pay | Admitting: Family Medicine

## 2021-11-20 DIAGNOSIS — B3731 Acute candidiasis of vulva and vagina: Secondary | ICD-10-CM

## 2021-11-20 DIAGNOSIS — E039 Hypothyroidism, unspecified: Secondary | ICD-10-CM

## 2021-11-20 LAB — CYTOLOGY - PAP
Chlamydia: NEGATIVE
Comment: NEGATIVE
Comment: NORMAL
Diagnosis: NEGATIVE
Neisseria Gonorrhea: NEGATIVE

## 2021-11-20 MED ORDER — LEVOTHYROXINE SODIUM 100 MCG PO TABS: 100.0000 ug | ORAL_TABLET | Freq: Every day | ORAL | 3 refills | Status: DC

## 2021-11-20 MED ORDER — FLUCONAZOLE 150 MG PO TABS: 150.0000 mg | ORAL_TABLET | Freq: Once | ORAL | 0 refills | Status: AC

## 2021-11-20 NOTE — Telephone Encounter (Signed)
Please see the MyChart message reply(ies) for my assessment and plan.    This patient gave consent for this Medical Advice Message and is aware that it may result in a bill to [employer], as well as the possibility of receiving a bill for a co-payment or deductible. They are an established patient, but are not seeking medical advice exclusively about a problem treated during an in person or video visit in the last seven days. I did not recommend an in person or video visit within seven days of my reply.    I spent a total of 7 minutes cumulative time within 7 days through [employer].  Charlott Rakes, MD

## 2021-12-01 ENCOUNTER — Encounter: Payer: Self-pay | Admitting: Family Medicine

## 2021-12-02 NOTE — Telephone Encounter (Unsigned)
Copied from [place] [phone]. Topic: General - Other >> Dec 02, 2021 11:21 AM Chapman Fitch wrote: Reason for CRM: Pt called to see if Dr. Margarita Rana can respond to the fax from [employer] / please advise / pt sent a mychart message as well

## 2021-12-02 NOTE — Telephone Encounter (Signed)
Copied from [place] [phone]. Topic: General - Other >> Dec 02, 2021 11:21 AM Chapman Fitch wrote: Reason for CRM: Pt called to see if Dr. Margarita Rana can respond to the fax from [employer] / please advise / pt sent a mychart message as well

## 2021-12-02 NOTE — Telephone Encounter (Unsigned)
Copied from [place] [phone]. Topic: General - Other >> Dec 02, 2021 11:21 AM Chapman Fitch wrote: Reason for CRM: Pt called to see if Dr. Margarita Rana can respond to the fax from [employer] / please advise / pt sent a mychart message as well

## 2021-12-03 ENCOUNTER — Ambulatory Visit: Payer: Medicaid Other | Attending: Family Medicine | Admitting: Family Medicine

## 2021-12-03 ENCOUNTER — Encounter: Payer: Self-pay | Admitting: Family Medicine

## 2021-12-03 DIAGNOSIS — N840 Polyp of corpus uteri: Secondary | ICD-10-CM

## 2021-12-03 DIAGNOSIS — N939 Abnormal uterine and vaginal bleeding, unspecified: Secondary | ICD-10-CM

## 2021-12-03 MED ORDER — MISC. DEVICES MISC: 11 refills | Status: DC

## 2021-12-03 NOTE — Progress Notes (Signed)
Virtual Visit via Telephone Note  I connected with Kimberly [name], on 12/03/2021 at 1:40 PM by telephone and verified that I am speaking with the correct person using two identifiers.   Consent: I discussed the limitations, risks, security and privacy concerns of performing an evaluation and management service by telephone and the availability of in person appointments. I also discussed with the patient that there may be a patient responsible charge related to this service. The patient expressed understanding and agreed to proceed.   Location of Patient: Home  Location of Provider: Clinic   Persons participating in Telemedicine visit: KEITRA CARUSONE Dr. Margarita Rana     History of Present Illness: Kimberly [name] is a 31 y.o. year old female with  a history of anxiety and depression, hypothyroidism, chronic low back pain, vertigo, migraines, COVID-19 in 07/2019.    Seen at the ED due to 7 days of menorrhagia which required an ED visit. She has heavy menstrual periods and is having to wear adult pull ups and her sanitary pads do not hold the blood.  Seen by GYN last month and diagnosed with abnormal uterine bleed secondary to endometrial polyps.  Pelvic ultrasound from 10/2021 revealed: IMPRESSION: 1. Heterogeneous appearance of the myometrium, which could reflect adenomyosis. 2. Equivocal small lower endometrial canal polyp, only evident on 3D imaging. Consider further evaluation with sonohysterogram for confirmation prior to hysteroscopy. Endometrial sampling should also be considered if patient is at high risk for endometrial carcinoma. (Ref: Radiological Reasoning: Algorithmic Workup of Abnormal Vaginal Bleeding with Endovaginal Sonography and Sonohysterography. AJR 2008; 366:Y40-34) 3. Simple appearing left adnexal/ovarian cyst, 13.2 cm in greatest dimension, measuring slightly larger than the previous ultrasound. Recommend GYN consult and consider pelvis MRI wo and w  contrast if clinically warranted. Note: This recommendation does not apply to premenarchal patients or to those with increased risk (genetic, family history, elevated tumor markers or other high-risk factors) of ovarian cancer. Reference: Radiology 2019 Nov; 293(2):359-371. 4. No other abnormalities.     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    Iron deficiency anemia    Migraines    "weekly" (11/16/2012)   Oligomenorrhea 03/01/2011   Ovarian cyst    Pregnancy induced hypertension    Schizophrenia ([place])    "moderate" (11/16/2012)   Scoliosis    Allergies  Allergen Reactions   Cold & Cough Daytime [Acetaminophen-Dm] Shortness Of Breath    Could not tolerate- orange liquid, may have contained Pseudoephedrine- "I could not tolerate"   Sulfa Antibiotics Swelling   Zofran [Ondansetron] Other (See Comments)    Muscle Spasms   Abilify [Aripiprazole] Other (See Comments)    Lethargy, unable to function   Amoxicillin-Pot Clavulanate Other (See Comments)    Stomach pains Has patient had a PCN reaction causing immediate rash, facial/tongue/throat swelling, SOB or lightheadedness with hypotension: No Has patient had a PCN reaction causing severe rash involving mucus membranes or skin necrosis: No Has patient had a PCN reaction that required hospitalization No Has patient had a PCN reaction occurring within the last 10 years:  Yes If all of the above answers are "NO", then may proceed with Cephalosporin use.    Cephalexin Other (See Comments)    Stomach pains Pt taking Cefadroxil as outpatient   Latex Swelling and Other (See Comments)     Burning of skin, too   Phenergan [Promethazine]     'gave me twitches' 'not able to sit still'   Tape Other (See Comments)    Swelling, burning of skin .    Zicam Cold Remedy [Homeopathic Products] Nausea And Vomiting   Keflex [Cephalexin] Rash and Other (See Comments)    Rash--able to take w/ Benadryl   Vicodin [Hydrocodone-Acetaminophen] Hives and Rash    Per pt- makes her very sleepy, feels like she isn't getting her breath. States she does not have throat/tongue swelling or anaphylaxis.    Current Outpatient Medications on File Prior to Visit  Medication Sig Dispense Refill   FLUoxetine (PROZAC) 20 MG capsule TAKE 1 CAPSULE(20 MG) BY MOUTH DAILY 90 capsule 1   levothyroxine (SYNTHROID) 100 MCG tablet Take 1 tablet (100 mcg total) by mouth daily. 30 tablet 3   LORazepam (ATIVAN) 1 MG tablet Take 1 tablet (1 mg total) by mouth Once PRN for up to 1 dose for anxiety (Please take before procedure). 1 tablet 0   meclizine (ANTIVERT) 25 MG tablet TAKE 1 TABLET(25 MG) BY MOUTH THREE TIMES DAILY AS NEEDED FOR DIZZINESS 90 tablet 11   omeprazole (PRILOSEC) 20 MG capsule Take 1 capsule (20 mg total) by mouth daily. 90 capsule 1   rizatriptan (MAXALT-MLT) 5 MG disintegrating tablet Take 1 tablet (5 mg total) by mouth as needed for migraine. May repeat in 2 hours if needed (Patient not taking: Reported on 09/01/2021) 10 tablet 6   topiramate (TOPAMAX) 25 MG tablet Take 1 tablet (25 mg total) by mouth daily. May increase to 2 tablets after 2 weeks if needed 90 tablet 0   No current facility-administered medications on file prior to visit.    ROS: See HPI  Observations/Objective: Awake, alert, oriented x3 Not in acute distress Normal mood      Latest Ref Rng & Units 11/02/2021    9:30 AM 09/01/2021    4:15 PM 10/23/2020    5:39 AM  CMP  Glucose 70 - 99 mg/dL 92  135  115   BUN 6 - 20 mg/dL '8  9  12   '$ Creatinine 0.44 - 1.00 mg/dL 0.57  0.51  0.36   Sodium 135 - 145 mmol/L 137  141   140   Potassium 3.5 - 5.1 mmol/L 3.5  4.0  3.9   Chloride 98 - 111 mmol/L 104  104  104   CO2 22 - 32 mmol/L '25  19  25   '$ Calcium 8.9 - 10.3 mg/dL 9.1  9.4  9.4   Total Protein 6.0 - 8.5 g/dL  6.8  7.7   Total Bilirubin 0.0 - 1.2 mg/dL  0.5  0.5   Alkaline Phos 44 - 121 IU/L  92  83   AST 0 - 40 IU/L  21  15   ALT 0 - 32 IU/L  19  14     Lipid Panel     Component Value Date/Time   CHOL 178 06/08/2016 0936   TRIG 237 (H) 06/08/2016 0936   HDL 41 06/08/2016 0936   CHOLHDL 4.3 06/08/2016 0936   CHOLHDL 4.4 08/07/2013 1141   VLDL 29 08/07/2013 1141   LDLCALC 90 06/08/2016 0936   LABVLDL  47 (H) 06/08/2016 0936    Lab Results  Component Value Date   HGBA1C 5.1 09/01/2021    Assessment and Plan: 1. Abnormal uterine bleeding due to endometrial polyp Prescription for pull ups written Plan is for endometrial biopsy with GYN - Misc. Devices MISC; Adult pullups 2xl  Dispense: 2 packet; Refill: 11   Follow Up Instructions: Keep previously scheduled appointment   I discussed the assessment and treatment plan with the patient. The patient was provided an opportunity to ask questions and all were answered. The patient agreed with the plan and demonstrated an understanding of the instructions.   The patient was advised to call back or seek an in-person evaluation if the symptoms worsen or if the condition fails to improve as anticipated.     I provided 11 minutes total of non-face-to-face time during this encounter.   Charlott Rakes, MD, FAAFP. [facility] [place], [place]   12/03/2021, 1:40 PM

## 2021-12-03 NOTE — Patient Instructions (Signed)
Abnormal Uterine Bleeding Abnormal uterine bleeding means bleeding more than normal from your womb (uterus). It can include: Bleeding after sex. Bleeding between monthly (menstrual) periods. Bleeding that is heavier than normal. Monthly periods that last longer than normal. Bleeding after you have stopped having your monthly period (menopause). You should see a doctor for any kind of bleeding that is not normal. Treatment depends on the cause of your bleeding and how much you bleed. Follow these instructions at home: Medicines Take over-the-counter and prescription medicines only as told by your doctor. Ask your doctor about: Taking medicines such as aspirin and ibuprofen. Do not take these medicines unless your doctor tells you to take them. Taking over-the-counter medicines, vitamins, herbs, and supplements. You may be given iron pills. Take them as told by your doctor. Managing constipation If you take iron pills, you may need to take these actions to prevent or treat trouble pooping (constipation): Drink enough fluid to keep your pee (urine) pale yellow. Take over-the-counter or prescription medicines. Eat foods that are high in fiber. These include beans, whole grains, and fresh fruits and vegetables. Limit foods that are high in fat and sugar. These include fried or sweet foods. Activity Change your activity to decrease bleeding if you need to change your sanitary pad more than one time every 2 hours: Lie in bed with your feet raised (elevated). Place a cold pack on your lower belly. Rest as much as you are able until the bleeding stops or slows down. General instructions Do not use tampons, douche, or have sex until your doctor says these things are okay. Change your pads often. Get regular exams. These include: Pelvic exams. Screenings for cancer of the cervix. It is up to you to get the results of any tests that are done. Ask how to get your results when they are  ready. Watch for any changes in your bleeding. For 2 months, write down: When your monthly period starts. When your monthly period ends. When you get any abnormal bleeding from your vagina. What problems you notice. Keep all follow-up visits. Contact a doctor if: The bleeding lasts more than one week. You feel dizzy at times. You feel like you may vomit (nausea). You vomit. You feel light-headed or weak. Your symptoms get worse. Get help right away if: You faint. You have to change pads every hour. You have pain in your belly. You have a fever or chills. You get sweaty or weak. You pass large blood clots from your vagina. These symptoms may be an emergency. Get help right away. Call your local emergency services (911 in the [place] Do not wait to see if the symptoms will go away. Do not drive yourself to the hospital. Summary Abnormal uterine bleeding means bleeding more than normal from your womb (uterus). Any kind of bleeding that is not normal should be checked by a doctor. Treatment depends on the cause of your bleeding and how much you bleed. Get help right away if you faint, you have to change pads every hour, or you pass large blood clots from your vagina. This information is not intended to replace advice given to you by your health care provider. Make sure you discuss any questions you have with your health care provider. Document Revised: 05/07/2020 Document Reviewed: 05/07/2020 Elsevier Patient Education  2023 [employer]  

## 2021-12-04 DIAGNOSIS — R32 Unspecified urinary incontinence: Secondary | ICD-10-CM | POA: Diagnosis not present

## 2021-12-15 ENCOUNTER — Ambulatory Visit (INDEPENDENT_AMBULATORY_CARE_PROVIDER_SITE_OTHER): Payer: Medicaid Other | Admitting: Obstetrics and Gynecology

## 2021-12-15 ENCOUNTER — Encounter: Payer: Self-pay | Admitting: Obstetrics and Gynecology

## 2021-12-15 ENCOUNTER — Other Ambulatory Visit: Payer: Self-pay

## 2021-12-15 VITALS — BP 142/99 | HR 114 | Ht 64.0 in | Wt 267.9 lb

## 2021-12-15 DIAGNOSIS — N83202 Unspecified ovarian cyst, left side: Secondary | ICD-10-CM

## 2021-12-15 DIAGNOSIS — R9389 Abnormal findings on diagnostic imaging of other specified body structures: Secondary | ICD-10-CM | POA: Diagnosis not present

## 2021-12-15 NOTE — Progress Notes (Signed)
Kimberly [name] presents for discussion of her DUB, endometrial polyp and ovarian cyst. Cycle in Oct was longer and heavier than usual. Nov cycle was normal Having monthly cycles H/O PCOS and thyroid dysfunction  U/S thicken endometrium and probable endometrial polyp. Know ovarian cyst 14 x 10 x 15 cm, simple.  GYN U/S reviewed with pt  PE  AF VSS Lungs clear Heart RRR\ Abd soft + BS obese GU deferred  A/P Probable endometrial polyp        Ovarain cyst  Management options reviewed with pt anf her partner Lap with ovarian cystectomy and hysteroscopy recommended and reviewed. R/B/Post op care discussed. Pt is agreeable to schedule F/U with post op appt.

## 2021-12-15 NOTE — Patient Instructions (Signed)
Ovarian Cystectomy, Care After This sheet gives you information about how to care for yourself after your procedure. Your health care provider may also give you more specific instructions. If you have problems or questions, contact your health care provider. What can I expect after the procedure? After the procedure, it is common to have: Pain in the abdomen, especially at the incision areas. You will be given pain medicine to control the pain. Tiredness. This is a normal part of the recovery process. Your energy level will return to normal over the next several weeks. Follow these instructions at home: Medicines Take over-the-counter and prescription medicines only as told by your health care provider. If you were prescribed an antibiotic medicine, use it as told by your health care provider. Do not stop using the antibiotic even if you start to feel better. Do not take aspirin because it can cause bleeding. Ask your health care provider if the medicine prescribed to you: Requires you to avoid driving or using heavy machinery. Can cause constipation. You may need to take these actions to prevent or treat constipation: Drink enough fluid to keep your urine pale yellow. Take over-the-counter or prescription medicines. Eat foods that are high in fiber, such as beans, whole grains, and fresh fruits and vegetables. Limit foods that are high in fat and processed sugars, such as fried or sweet foods. Incision care  Follow instructions from your health care provider about how to take care of your incisions. Make sure you: Wash your hands with soap and water for at least 20 seconds before and after you change your bandage (dressing). If soap and water are not available, use hand sanitizer. Change your dressing as told by your health care provider. Leave stitches (sutures), skin glue, or adhesive strips in place. These skin closures may need to stay in place for 2 weeks or longer. If adhesive strip  edges start to loosen and curl up, you may trim the loose edges. Do not remove adhesive strips completely unless your health care provider tells you to do that. Check your incision areas every day for signs of infection. Check for: More redness, swelling, or pain. Fluid or blood. Warmth. Pus or a bad smell. Do not take baths, swim, or use a hot tub until your health care provider approves. Ask your health care provider if you may take showers. You may only be allowed to take sponge baths. Activity Rest as told by your health care provider. Avoid sitting for a long time without moving. Get up to take short walks every 1-2 hours. This is important to improve blood flow and breathing. Ask for help if you feel weak or unsteady. Do not lift anything that is heavier than 10 lb (4.5 kg), or the limit that you are told, until your health care provider says that it is safe. Return to your normal activities and diet as told by your health care provider. Ask your health care provider what activities are safe for you. General instructions Do not douche, use tampons, or have sex until your health care provider says it is okay. Do not use any products that contain nicotine or tobacco, such as cigarettes, e-cigarettes, and chewing tobacco. These can delay incision healing after surgery. If you need help quitting, ask your health care provider. Keep all follow-up visits. This is important. Contact a health care provider if: You have a fever or chills. You feel nauseous or you vomit. You have pain when you urinate or have blood in  your urine. You have a rash on your body. You have pain or redness where the IV was inserted. You have pain that is not relieved with medicine. You have any of these signs of infection: More redness, swelling, or pain around an incision. Fluid or blood coming from an incision. Warmth coming from an incision. Pus or a bad smell coming from an incision. Get help right away  if: You have chest pain or shortness of breath. You feel dizzy or light-headed. You have heavy bleeding. You have increasing abdominal pain that is not relieved with medicine. You have pain, swelling, or redness in your leg. Your incision is opening and the edges are not staying together. Summary After the procedure, it is common to have some pain in your abdomen. You will be given pain medicine to control the pain. Follow instructions from your health care provider about how to take care of your incisions. Do not douche, use tampons, or have sex until your health care provider says it okay. Keep all follow-up visits. This is important. This information is not intended to replace advice given to you by your health care provider. Make sure you discuss any questions you have with your health care provider. Document Revised: 06/15/2019 Document Reviewed: 06/15/2019 Elsevier Patient Education  [place]. Ovarian Cystectomy Ovarian cystectomy is a procedure that is done to remove a fluid-filled sac (cyst) on an ovary. The ovaries are small organs that produce eggs in women. Various types of cysts can form on the ovaries. Most cysts are not cancerous. This procedure may be done for cysts that are large, cause symptoms, or do not go away on their own. It may also be done for a cyst that is cancerous or might be cancerous. This surgery can be done using a laparoscopic technique or an open abdominal technique. The laparoscopic technique is minimally invasive and results in smaller incisions and a faster recovery. The technique used will depend on your age, the type of cyst that you have, and whether the cyst is cancerous. The laparoscopic technique is not used for a cancerous cyst. Tell a health care provider about: Any allergies you have. All medicines you are taking, including vitamins, herbs, eye drops, creams, and over-the-counter medicines. Any problems you or family members have had with  anesthetic medicines. Any blood disorders you have. Any surgeries you have had. Any medical conditions you have. Whether you are pregnant or may be pregnant. What are the risks? Generally, this is a safe procedure. However, problems may occur, including: Excessive bleeding. Infection. Damage to nearby structures or organs. Allergic reactions to medicines. Blood clots. Inability to get pregnant (infertility). What happens before the procedure? Staying hydrated Follow instructions from your health care provider about hydration, which may include: Up to 2 hours before the procedure - you may continue to drink clear liquids, such as water, clear fruit juice, black coffee, and plain tea.  Eating and drinking restrictions Follow instructions from your health care provider about eating and drinking, which may include: 8 hours before the procedure - stop eating heavy meals or foods, such as meat, fried foods, or fatty foods. 6 hours before the procedure - stop eating light meals or foods, such as toast or cereal. 6 hours before the procedure - stop drinking milk or drinks that contain milk. 2 hours before the procedure - stop drinking clear liquids. Medicines Ask your health care provider about: Changing or stopping your regular medicines. This is especially important if you are  taking diabetes medicines or blood thinners. Taking medicines such as aspirin and ibuprofen. These medicines can thin your blood. Do not take these medicines unless your health care provider tells you to take them. Taking over-the-counter medicines, vitamins, herbs, and supplements. General instructions Do not use any products that contain nicotine or tobacco for at least 4 weeks before the procedure. These products include cigarettes, e-cigarettes, and chewing tobacco. If you need help quitting, ask your health care provider. Ask your health care provider: How your surgery site will be marked. What steps will be  taken to help prevent infection. These may include: Removing hair at the surgery site. Washing skin with a germ-killing soap. Taking antibiotic medicine. You may be asked to shower with a germ-killing soap. Plan to have someone take you home from the hospital or clinic. Plan to have someone help with household activities for 1-2 weeks after the procedure. Let your health care provider know if you develop a cold or any infection before your surgery. What happens during the procedure? An IV will be inserted into one of your veins. You will be given one or more of the following: A medicine to help you relax (sedative). A medicine to make you fall asleep (general anesthetic). Small monitors will be attached to your body. They will be used to check your heart, blood pressure, and oxygen level. A breathing tube will be placed to help you breathe during the procedure. Your surgeon will do the surgery using either the laparoscopic technique or the open abdominal technique. Laparoscopic technique  Several small incisions will be made in your abdomen. Your abdomen will be filled with carbon dioxide gas to make it expand. This will give the surgeon more room to operate. It will also make your organs easier to see. A thin scope with a camera (laparoscope) will be put through one of the small incisions. The laparoscope will send a picture to a monitor in the operating room to help the surgeon see inside your body. Hollow tubes will be put through the other small incisions in your abdomen. The tools needed for the procedure will be put through these tubes. The ovary with the cyst will be identified, and the cyst will be removed. The tools will then be removed, and the incisions will be closed with stitches or skin glue. Bandages (dressings) may be applied to the incisions. Open abdominal technique A single, large incision will be made along your bikini line or in the middle of your lower abdomen. The  ovary with the cyst will be identified, and the cyst will be removed. The incision will then be closed with stitches or staples. Bandages (dressings) may be applied to the incisions. The procedure may vary among health care providers and hospitals. What happens after the procedure? Your blood pressure, heart rate, breathing rate, and blood oxygen level will be monitored until you leave the hospital or clinic. Your IV will be removed after you are able to eat and drink well. You may be given medicine for pain or to help you sleep. You may be given an antibiotic medicine. Do not drive for 24 hours if you were given a sedative during the procedure. The cyst that was removed will be sent to the lab for testing. If the cyst has cancer cells, both ovaries may need to be removed during a different surgery. It is up to you to get the results of your procedure. Ask your health care provider, or the department that is doing the  procedure, when your results will be ready. Summary Ovarian cystectomy is a procedure that is done to remove a cyst on an ovary. This procedure may be done for cysts that are large, cause symptoms, or do not go away on their own. It may also be done for a cyst that is cancerous or might be cancerous. Follow instructions from your health care provider about eating and drinking before the procedure. After the cyst is removed, it will be sent to the lab for testing. This information is not intended to replace advice given to you by your health care provider. Make sure you discuss any questions you have with your health care provider. Document Revised: 06/15/2019 Document Reviewed: 06/15/2019 Elsevier Patient Education  [place]. Hysteroscopy Hysteroscopy is a procedure used to look inside a woman's womb (uterus). This may be done for various reasons, including: To look for tumors and other growths in the uterus. To evaluate abnormal bleeding, fibroid tumors, polyps, scar  tissue, or uterine cancer. To determine why a woman is unable to get pregnant or has had repeated pregnancy losses (miscarriages). To locate an intrauterine device (IUD). To place a birth control device into the fallopian tubes. During this procedure, a thin, flexible tube with a small light and camera (hysteroscope) is used to examine the uterus. The camera sends images to a monitor in the room so that your health care provider can view the inside of your uterus. A hysteroscopy should be done right after a menstrual period. Tell a health care provider about: Any allergies you have. All medicines you are taking, including vitamins, herbs, eye drops, creams, and over-the-counter medicines. Any problems you or family members have had with anesthetic medicines. Any bleeding problems you have. Any surgeries you have had. Any medical conditions you have. Whether you are pregnant or may be pregnant. Whether you have been diagnosed with an sexually transmitted infection (STI) or you think you have an STI. What are the risks? Your health care provider will talk with you about risks. These may include: Excessive bleeding. Infection. Damage to the uterus or other structures or organs. Allergic reaction to medicines or fluids that are used in the procedure. What happens before the procedure? When to stop eating and drinking Follow instructions from your health care provider about what you may eat and drink. These may include: 8 hours before your procedure Stop eating most foods. Do not eat meat, fried foods, or fatty foods. Eat only light foods, such as toast or crackers. All liquids are okay except energy drinks and alcohol. 6 hours before your procedure Stop eating. Drink only clear liquids, such as water, clear fruit juice, black coffee, plain tea, and sports drinks. Do not drink energy drinks or alcohol. 2 hours before your procedure Stop drinking all liquids. You may be allowed to take  medicines with small sips of water. If you do not follow your health care provider's instructions, your procedure may be delayed or canceled. Medicines Ask your health care provider about: Changing or stopping your regular medicines. These include any diabetes medicines or blood thinners you take. Taking medicines such as aspirin and ibuprofen. These medicines can thin your blood. Do not take them unless your health care provider tells you to. Taking over-the-counter medicines, vitamins, herbs, and supplements. Medicine may be placed in your cervix the day before the procedure. This medicine causes the cervix to open (dilate). The larger opening makes it easier for the hysteroscope to be inserted into the uterus during  the procedure. General instructions Ask your health care provider: What steps will be taken to help prevent infection. These steps may include: Washing skin with a soap that kills germs. Taking antibiotic medicine. Do not use any products that contain nicotine or tobacco for at least 4 weeks before the procedure. These products include cigarettes, chewing tobacco, and vaping devices, such as e-cigarettes. If you need help quitting, ask your health care provider. If you will be going home right after the procedure, plan to have a responsible adult: Take you home from the hospital or clinic. You will not be allowed to drive. Care for you for the time you are told. Empty your bladder before the procedure begins. What happens during the procedure? An IV will be inserted into one of your veins. You may be given: A sedative. This helps you relax. Anesthesia. This will: Numb certain areas of your body. Make you fall asleep for surgery. A hysteroscope will be inserted through your vagina and into your uterus. Air or fluid will be used to enlarge your uterus to allow your health care provider to see it better. The amount of fluid used will be carefully checked throughout the  procedure. In some cases, tissue may be gently scraped from inside the uterus and sent to a lab for testing (biopsy). The procedure may vary among health care providers and hospitals. What happens after the procedure? Your blood pressure, heart rate, breathing rate, and blood oxygen level will be monitored until you leave the hospital or clinic. You may have cramps. You may be given medicines for this. You may have bleeding, which may vary from light spotting to menstrual-like bleeding. This is normal. If you had a biopsy, it is up to you to get the results. Ask your health care provider, or the department that is doing the procedure, when your results will be ready. Summary Hysteroscopy is a procedure that is used to look inside a woman's womb (uterus). After the procedure, you may have bleeding, which varies from light spotting to menstrual-like bleeding. This is normal. You may also have cramps. Plan to have a responsible adult take you home from the hospital or clinic. If you had a biopsy, it is up to you to get the results. Ask your health care provider, or the department that is doing the procedure, when your results will be ready. This information is not intended to replace advice given to you by your health care provider. Make sure you discuss any questions you have with your health care provider. Document Revised: 04/21/2021 Document Reviewed: 04/21/2021 Elsevier Patient Education  [place].

## 2021-12-19 DIAGNOSIS — Z419 Encounter for procedure for purposes other than remedying health state, unspecified: Secondary | ICD-10-CM | POA: Diagnosis not present

## 2021-12-22 ENCOUNTER — Encounter (HOSPITAL_BASED_OUTPATIENT_CLINIC_OR_DEPARTMENT_OTHER): Payer: Medicaid Other | Admitting: Family Medicine

## 2021-12-22 ENCOUNTER — Ambulatory Visit: Payer: Self-pay | Admitting: *Deleted

## 2021-12-22 ENCOUNTER — Encounter: Payer: Self-pay | Admitting: Obstetrics and Gynecology

## 2021-12-22 DIAGNOSIS — U071 COVID-19: Secondary | ICD-10-CM

## 2021-12-22 MED ORDER — NIRMATRELVIR/RITONAVIR (PAXLOVID)TABLET: 3.0000 | ORAL_TABLET | Freq: Two times a day (BID) | ORAL | 0 refills | Status: AC

## 2021-12-22 NOTE — Telephone Encounter (Signed)
Noted  

## 2021-12-22 NOTE — Telephone Encounter (Signed)

## 2021-12-22 NOTE — Telephone Encounter (Signed)
Summary: covid   Pt tested positive for covid today / she is experiencing a sore throat , runny nose, light headache and body aches / symptoms started yesterday / pt was advised to call office for an antibiotic or medication         Chief Complaint: + COVID Symptoms: congestion, sore throat Frequency: 2 days ago Pertinent Negatives: Patient denies fever,SOB Disposition: '[]'$ ED /'[]'$ Urgent Care (no appt availability in office) / '[]'$ Appointment(In office/virtual)/ '[]'$  [facility]/ '[x]'$ Home Care/ '[]'$ Refused Recommended Disposition /'[]'$ [facility]/ '[]'$  Follow-up with PCP Additional Notes: Patient has sent message to PCP and medication has been prescribed. Patient advised per COVID protocol- treatment/isolation. Advised follow up if gets worse. Reason for Disposition  [1] HIGH RISK patient (e.g., weak immune system, age > 37 years, obesity with BMI 30 or higher, pregnant, chronic lung disease or other chronic medical condition) AND [2] COVID symptoms (e.g., cough, fever)  (Exceptions: Already seen by PCP and no new or worsening symptoms.)  Answer Assessment - Initial Assessment Questions 1. COVID-19 DIAGNOSIS: "How do you know that you have COVID?" (e.g., positive lab test or self-test, diagnosed by doctor or NP/PA, symptoms after exposure).     + COVID home test 2. COVID-19 EXPOSURE: "Was there any known exposure to COVID before the symptoms began?" CDC Definition of close contact: within 6 feet (2 meters) for a total of 15 minutes or more over a 24-hour period.      Son and boyfriend 3. ONSET: "When did the COVID-19 symptoms start?"      2 days ago 4. WORST SYMPTOM: "What is your worst symptom?" (e.g., cough, fever, shortness of breath, muscle aches)     congestion 5. COUGH: "Do you have a cough?" If Yes, ask: "How bad is the cough?"       no 6. FEVER: "Do you have a fever?" If Yes, ask: "What is your temperature, how was it measured, and when did it start?"     Yes- 99.- last  night, forehead thermometer 7. RESPIRATORY STATUS: "Describe your breathing?" (e.g., normal; shortness of breath, wheezing, unable to speak)       normal 8. BETTER-SAME-WORSE: "Are you getting better, staying the same or getting worse compared to yesterday?"  If getting worse, ask, "In what way?"     Worse- sore throat and congestion are worse 9. OTHER SYMPTOMS: "Do you have any other symptoms?"  (e.g., chills, fatigue, headache, loss of smell or taste, muscle pain, sore throat)     Sore throat 10. HIGH RISK DISEASE: "Do you have any chronic medical problems?" (e.g., asthma, heart or lung disease, weak immune system, obesity, etc.)       Weak immune system, asthma 11. VACCINE: "Have you had the COVID-19 vaccine?" If Yes, ask: "Which one, how many shots, when did you get it?"       no 12. PREGNANCY: "Is there any chance you are pregnant?" "When was your last menstrual period?"       No- LMP- 12/01/21  Protocols used: Coronavirus (COVID-19) Diagnosed or Suspected-A-AH

## 2021-12-24 DIAGNOSIS — R32 Unspecified urinary incontinence: Secondary | ICD-10-CM | POA: Diagnosis not present

## 2022-01-07 NOTE — Progress Notes (Deleted)
   CC:  headaches  Follow-up Visit  Last visit: 07/03/21  Brief HPI: 31 year old female with a history of anxiety, scoliosis, lumbar degenerative disc disease who follows in clinic for migraines and occipital neuralgia.  At her last visit, MRI brain and C-spine were ordered. Propranolol was started for headache prevention and Maxalt was started for rescue.  Interval History: ***Headaches***propranolol***maxalt***neck pain***  MRI brain and C-spine 08/17/21 were unremarkable.  Headache days per month: *** Migraine days per month*** Headache free days per month: ***  Current Headache Regimen: Preventative: propranolol 20 mg BID Abortive: Maxalt 5 mg PRN   Prior Therapies                                  Rescue: Fioricet Tylenol Naproxen Maxalt 5 mg PRN  Prevention: Propranolol 20 mg BID  Physical Exam:   Vital Signs: LMP 12/01/2021 (Exact Date)  GENERAL:  well appearing, in no acute distress, alert  SKIN:  Color, texture, turgor normal. No rashes or lesions HEAD:  Normocephalic/atraumatic. RESP: normal respiratory effort MSK:  No gross joint deformities.   NEUROLOGICAL: Mental Status: Alert, oriented to person, place and time, Follows commands, and Speech fluent and appropriate. Cranial Nerves: PERRL, face symmetric, no dysarthria, hearing grossly intact Motor: moves all extremities equally Gait: normal-based.  IMPRESSION: ***  PLAN: ***   Follow-up: ***  I spent a total of *** minutes on the date of the service. Headache education was done. Discussed lifestyle modification including increased oral hydration, decreased caffeine, exercise and stress management. Discussed treatment options including preventive and acute medications, natural supplements, and infusion therapy. Discussed medication overuse headache and to limit use of acute treatments to no more than 2 days/week or 10 days/month. Discussed medication side effects, adverse reactions and drug  interactions. Written educational materials and patient instructions outlining all of the above were given.  Genia Harold, MD

## 2022-01-08 ENCOUNTER — Ambulatory Visit: Payer: Medicaid Other | Admitting: Psychiatry

## 2022-01-19 DIAGNOSIS — R32 Unspecified urinary incontinence: Secondary | ICD-10-CM | POA: Diagnosis not present

## 2022-01-19 DIAGNOSIS — Z419 Encounter for procedure for purposes other than remedying health state, unspecified: Secondary | ICD-10-CM | POA: Diagnosis not present

## 2022-02-09 NOTE — H&P (Signed)
Kimberly [name] is an 32 y.o. female with known ovarian cyst which has increased in size and causing some pain. GYN U/S cyst appears to be simple, 14 x 10 x 15 cm. Also thicken endometrium on U/S. Findings reviewed with pt. Surgerical evaluation has been recommended and reviewed with pt.    Menstrual History: Menarche age: 44 No LMP recorded.    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    Iron deficiency anemia    Migraines    "weekly" (11/16/2012)   Oligomenorrhea 03/01/2011   Ovarian cyst    Pregnancy induced hypertension    Schizophrenia ([place])    "moderate" (11/16/2012)   Scoliosis     Past Surgical History:  Procedure Laterality Date   CESAREAN SECTION N/A 08/02/2019   Procedure: CESAREAN SECTION;  Surgeon: Gwynne Edinger, MD;  Location: [facility];  Service: Obstetrics;  Laterality: N/A;   LAPAROSCOPIC OVARIAN CYSTECTOMY Left 02/11/2016   Procedure: LAPAROSCOPIC OVARIAN CYSTECTOMY;  Surgeon: Emily Filbert, MD;  Location: [place] [facility];  Service: Gynecology;  Laterality: Left;   UPPER GI ENDOSCOPY  ~ 2006; ~2008    Family History  Problem Relation Age of Onset   Diabetes Mother    Hypertension Mother    Vision loss Mother    Hypertension Father    Hyperlipidemia Father    Thyroid disease Maternal Grandmother     Social History:  reports that she has never smoked. She has never used smokeless tobacco. She reports that she does not currently use alcohol. She reports that she does not use drugs.  Allergies:  Allergies  Allergen Reactions   Cold & Cough Daytime [Acetaminophen-Dm] Shortness Of Breath    Could not tolerate-  orange liquid, may have contained Pseudoephedrine- "I could not tolerate"   Sulfa Antibiotics Swelling   Zofran [Ondansetron] Other (See Comments)    Muscle Spasms   Abilify [Aripiprazole] Other (See Comments)    Lethargy, unable to function   Amoxicillin-Pot Clavulanate Other (See Comments)    Stomach pains Has patient had a PCN reaction causing immediate rash, facial/tongue/throat swelling, SOB or lightheadedness with hypotension: No Has patient had a PCN reaction causing severe rash involving mucus membranes or skin necrosis: No Has patient had a PCN reaction that required hospitalization No Has patient had a PCN reaction occurring within the last 10 years: Yes If all of the above answers are "NO", then may proceed with Cephalosporin use.    Cephalexin Other (See Comments)    Stomach pains Pt taking Cefadroxil as outpatient   Latex Swelling and Other (See Comments)    Burning of skin, too   Phenergan [Promethazine]     'gave me twitches' 'not able to sit still'   Tape Other (See Comments)    Swelling, burning of skin .    Zicam Cold Remedy [Homeopathic Products] Nausea And Vomiting   Keflex [Cephalexin] Rash and Other (See Comments)    Rash--able to take w/ Benadryl   Vicodin [Hydrocodone-Acetaminophen] Hives and Rash    Per pt- makes her very sleepy, feels like she  isn't getting her breath. States she does not have throat/tongue swelling or anaphylaxis.    No medications prior to admission.    Review of Systems  Constitutional: Negative.   Respiratory: Negative.    Cardiovascular: Negative.   Gastrointestinal: Negative.   Genitourinary:  Positive for pelvic pain.    There were no vitals taken for this visit. Physical Exam Constitutional:      Appearance: Normal appearance.  Cardiovascular:     Rate and Rhythm: Normal rate.  Pulmonary:     Effort: Pulmonary effort is normal.     Breath sounds: Normal breath sounds.  Abdominal:     General: Bowel sounds are  normal.  Genitourinary:    Comments: GU nl EGBUS, uterus small, mobile, non tender, fullness noted in adnexa, slightly tender    No results found for this or any previous visit (from the past 24 hour(s)).  No results found.  Assessment/Plan: Ovarian cyst Thicken endometrium  Hysteroscopy D & C and Laparoscopic ovarian cystectomy has been reviewed with pt. R/B/Post op care reviewed. Pt has verbalized understanding and desires to proceed.   Chancy Milroy 02/09/2022, 9:10 AM

## 2022-02-11 NOTE — Pre-Procedure Instructions (Signed)
Surgical Instructions    Your procedure is scheduled on Tuesday, January 30th.  Report to [facility] Main Entrance "A" at 05:30 A.M., then check in with the Admitting office.  Call this number if you have problems the morning of surgery:  [phone]  If you have any questions prior to your surgery date call [phone]: Open Monday-Friday 8am-4pm If you experience any cold or flu symptoms such as cough, fever, chills, shortness of breath, etc. between now and your scheduled surgery, please notify us at the above number.     Remember:  Do not eat after midnight the night before your surgery  You may drink clear liquids until 04:30 AM the morning of your surgery.   Clear liquids allowed are: Water, Non-Citrus Juices (without pulp), Carbonated Beverages, Clear Tea, Black Coffee Only (NO MILK, CREAM OR POWDERED CREAMER of any kind), and Gatorade.   Patient Instructions  The night before surgery:  No food after midnight. ONLY clear liquids after midnight  The day of surgery (if you do NOT have diabetes):  Drink ONE (1) Pre-Surgery Clear Ensure by 04:30 AM the morning of surgery. Drink in one sitting. Do not sip.  This drink was given to you during your hospital  pre-op appointment visit.  Nothing else to drink after completing the  Pre-Surgery Clear Ensure.          If you have questions, please contact your surgeon's office.     Take these medicines the morning of surgery with A SIP OF WATER  FLUoxetine (PROZAC)  levothyroxine (SYNTHROID)  omeprazole (PRILOSEC)    If needed: meclizine (ANTIVERT)   As of today, STOP taking any Aspirin (unless otherwise instructed by your surgeon) Aleve, Naproxen, Ibuprofen, Motrin, Advil, Goody's, BC's, all herbal medications, fish oil, and all vitamins.                     Do NOT Smoke (Tobacco/Vaping) for 24 hours prior to your procedure.  If you use a CPAP at night, you may bring your mask/headgear for your overnight stay.    Contacts, glasses, piercing's, hearing aid's, dentures or partials may not be worn into surgery, please bring cases for these belongings.    For patients admitted to the hospital, discharge time will be determined by your treatment team.   Patients discharged the day of surgery will not be allowed to drive home, and someone needs to stay with them for 24 hours.  SURGICAL WAITING ROOM VISITATION Patients having surgery or a procedure may have no more than 2 support people in the waiting area - these visitors may rotate.   Children under the age of 22 must have an adult with them who is not the patient. If the patient needs to stay at the hospital during part of their recovery, the visitor guidelines for inpatient rooms apply. Pre-op nurse will coordinate an appropriate time for 1 support person to accompany patient in pre-op.  This support person may not rotate.   Please refer to the [facility] website for the visitor guidelines for Inpatients (after your surgery is over and you are in a regular room).    Special instructions:   - Preparing For Surgery  Before surgery, you can play an important role. Because skin is not sterile, your skin needs to be as free of germs as possible. You can reduce the number of germs on your skin by washing with CHG (chlorahexidine gluconate) Soap before surgery.  CHG is an antiseptic cleaner  which kills germs and bonds with the skin to continue killing germs even after washing.    Oral Hygiene is also important to reduce your risk of infection.  Remember - BRUSH YOUR TEETH THE MORNING OF SURGERY WITH YOUR REGULAR TOOTHPASTE  Please do not use if you have an allergy to CHG or antibacterial soaps. If your skin becomes reddened/irritated stop using the CHG.  Do not shave (including legs and underarms) for at least 48 hours prior to first CHG shower. It is OK to shave your face.  Please follow these instructions carefully.   Shower the NIGHT BEFORE  SURGERY and the MORNING OF SURGERY  If you chose to wash your hair, wash your hair first as usual with your normal shampoo.  After you shampoo, rinse your hair and body thoroughly to remove the shampoo.  Use CHG Soap as you would any other liquid soap. You can apply CHG directly to the skin and wash gently with a scrungie or a clean washcloth.   Apply the CHG Soap to your body ONLY FROM THE NECK DOWN.  Do not use on open wounds or open sores. Avoid contact with your eyes, ears, mouth and genitals (private parts). Wash Face and genitals (private parts)  with your normal soap.   Wash thoroughly, paying special attention to the area where your surgery will be performed.  Thoroughly rinse your body with warm water from the neck down.  DO NOT shower/wash with your normal soap after using and rinsing off the CHG Soap.  Pat yourself dry with a CLEAN TOWEL.  Wear CLEAN PAJAMAS to bed the night before surgery  Place CLEAN SHEETS on your bed the night before your surgery  DO NOT SLEEP WITH PETS.   Day of Surgery: Take a shower with CHG soap. Do not wear jewelry or makeup Do not wear lotions, powders, perfumes, or deodorant. Do not shave 48 hours prior to surgery.   Do not bring valuables to the hospital. [facility] is not responsible for any belongings or valuables. Do not wear nail polish, gel polish, artificial nails, or any other type of covering on natural nails (fingers and toes) If you have artificial nails or gel coating that need to be removed by a nail salon, please have this removed prior to surgery. Artificial nails or gel coating may interfere with anesthesia's ability to adequately monitor your vital signs. Wear Clean/Comfortable clothing the morning of surgery Remember to brush your teeth WITH YOUR REGULAR TOOTHPASTE.   Please read over the following fact sheets that you were given.    If you received a COVID test during your pre-op visit  it is requested that you wear a  mask when out in public, stay away from anyone that may not be feeling well and notify your surgeon if you develop symptoms. If you have been in contact with anyone that has tested positive in the last 10 days please notify you surgeon.

## 2022-02-12 ENCOUNTER — Inpatient Hospital Stay (HOSPITAL_COMMUNITY)
Admission: RE | Admit: 2022-02-12 | Discharge: 2022-02-12 | Disposition: A | Discharge status: Home / Self Care | Payer: Medicaid Other | Source: Ambulatory Visit

## 2022-02-12 NOTE — Progress Notes (Signed)
Patient Contacted via phone call on 02/12/22 at 1320. Patient states she is unable to come to pre-op appointment due to testing positive for type B flu. Patient states she spoke to someone on the phone regarding her appointment and was told a note would be left.

## 2022-02-15 ENCOUNTER — Encounter: Payer: Self-pay | Admitting: Obstetrics and Gynecology

## 2022-02-17 DIAGNOSIS — N83209 Unspecified ovarian cyst, unspecified side: Secondary | ICD-10-CM

## 2022-02-19 DIAGNOSIS — Z419 Encounter for procedure for purposes other than remedying health state, unspecified: Secondary | ICD-10-CM | POA: Diagnosis not present

## 2022-02-19 DIAGNOSIS — R32 Unspecified urinary incontinence: Secondary | ICD-10-CM | POA: Diagnosis not present

## 2022-02-23 ENCOUNTER — Encounter: Payer: Self-pay | Admitting: Psychiatry

## 2022-02-23 ENCOUNTER — Encounter: Payer: Self-pay | Admitting: Family Medicine

## 2022-02-24 NOTE — Telephone Encounter (Signed)
Please have her schedule a follow up appt with me or with an NP, whoever can get her in sooner. We can discuss next steps at that appointment

## 2022-02-25 ENCOUNTER — Telehealth: Payer: Self-pay

## 2022-02-25 NOTE — Telephone Encounter (Signed)
Please look at note from today 02/25/2022

## 2022-02-26 ENCOUNTER — Ambulatory Visit (INDEPENDENT_AMBULATORY_CARE_PROVIDER_SITE_OTHER): Payer: Medicaid Other | Admitting: Physician Assistant

## 2022-02-26 ENCOUNTER — Encounter (INDEPENDENT_AMBULATORY_CARE_PROVIDER_SITE_OTHER): Payer: Self-pay

## 2022-02-27 ENCOUNTER — Ambulatory Visit: Payer: Medicaid Other

## 2022-03-09 ENCOUNTER — Ambulatory Visit: Payer: Medicaid Other | Admitting: Family Medicine

## 2022-03-16 ENCOUNTER — Other Ambulatory Visit: Payer: Self-pay | Admitting: Family Medicine

## 2022-03-16 ENCOUNTER — Encounter: Payer: Self-pay | Admitting: Family Medicine

## 2022-03-16 MED ORDER — LEVOTHYROXINE SODIUM 100 MCG PO TABS: 100.0000 ug | ORAL_TABLET | Freq: Every day | ORAL | 0 refills | Status: DC

## 2022-03-19 ENCOUNTER — Other Ambulatory Visit: Payer: Self-pay | Admitting: Family Medicine

## 2022-03-19 DIAGNOSIS — K219 Gastro-esophageal reflux disease without esophagitis: Secondary | ICD-10-CM

## 2022-03-20 ENCOUNTER — Other Ambulatory Visit: Payer: Self-pay | Admitting: Family Medicine

## 2022-03-20 DIAGNOSIS — K219 Gastro-esophageal reflux disease without esophagitis: Secondary | ICD-10-CM

## 2022-03-20 DIAGNOSIS — Z419 Encounter for procedure for purposes other than remedying health state, unspecified: Secondary | ICD-10-CM | POA: Diagnosis not present

## 2022-03-20 DIAGNOSIS — R32 Unspecified urinary incontinence: Secondary | ICD-10-CM | POA: Diagnosis not present

## 2022-03-20 MED ORDER — OMEPRAZOLE 20 MG PO CPDR: DELAYED_RELEASE_CAPSULE | ORAL | 1 refills | Status: DC

## 2022-03-26 ENCOUNTER — Telehealth: Payer: Medicaid Other | Admitting: Physician Assistant

## 2022-04-01 ENCOUNTER — Encounter (HOSPITAL_BASED_OUTPATIENT_CLINIC_OR_DEPARTMENT_OTHER): Payer: Medicaid Other | Admitting: Family Medicine

## 2022-04-01 DIAGNOSIS — L0292 Furuncle, unspecified: Secondary | ICD-10-CM

## 2022-04-02 MED ORDER — DOXYCYCLINE HYCLATE 100 MG PO TABS: 100.0000 mg | ORAL_TABLET | Freq: Two times a day (BID) | ORAL | 0 refills | Status: DC

## 2022-04-02 NOTE — Telephone Encounter (Signed)

## 2022-04-06 ENCOUNTER — Encounter: Payer: Self-pay | Admitting: Family Medicine

## 2022-04-06 NOTE — Pre-Procedure Instructions (Signed)
Surgical Instructions    Your procedure is scheduled on April 14, 2022.  Report to [facility] Main Entrance "A" at 7:40 A.M., then check in with the Admitting office.  Call this number if you have problems the morning of surgery:  [phone]  If you have any questions prior to your surgery date call [phone]: Open Monday-Friday 8am-4pm If you experience any cold or flu symptoms such as cough, fever, chills, shortness of breath, etc. between now and your scheduled surgery, please notify us at the above number.     Remember:  Do not eat after midnight the night before your surgery  You may drink clear liquids until 6:40 AM the morning of your surgery.   Clear liquids allowed are: Water, Non-Citrus Juices (without pulp), Carbonated Beverages, Clear Tea, Black Coffee Only (NO MILK, CREAM OR POWDERED CREAMER of any kind), and Gatorade.     Take these medicines the morning of surgery with A SIP OF WATER:  doxycycline (VIBRA-TABS)  ` FLUoxetine (PROZAC)   levothyroxine (SYNTHROID)   omeprazole (PRILOSEC)   meclizine (ANTIVERT) - may take if needed    As of today, STOP taking any Aspirin (unless otherwise instructed by your surgeon) Aleve, Naproxen, Ibuprofen, Motrin, Advil, Goody's, BC's, all herbal medications, fish oil, and all vitamins.                     Do NOT Smoke (Tobacco/Vaping) for 24 hours prior to your procedure.  If you use a CPAP at night, you may bring your mask/headgear for your overnight stay.   Contacts, glasses, piercing's, hearing aid's, dentures or partials may not be worn into surgery, please bring cases for these belongings.    For patients admitted to the hospital, discharge time will be determined by your treatment team.   Patients discharged the day of surgery will not be allowed to drive home, and someone needs to stay with them for 24 hours.  SURGICAL WAITING ROOM VISITATION Patients having surgery or a procedure may have no more than 2 support  people in the waiting area - these visitors may rotate.   Children under the age of 87 must have an adult with them who is not the patient. If the patient needs to stay at the hospital during part of their recovery, the visitor guidelines for inpatient rooms apply. Pre-op nurse will coordinate an appropriate time for 1 support person to accompany patient in pre-op.  This support person may not rotate.   Please refer to the [facility] website for the visitor guidelines for Inpatients (after your surgery is over and you are in a regular room).    Special instructions:   [place]- Preparing For Surgery  Before surgery, you can play an important role. Because skin is not sterile, your skin needs to be as free of germs as possible. You can reduce the number of germs on your skin by washing with CHG (chlorahexidine gluconate) Soap before surgery.  CHG is an antiseptic cleaner which kills germs and bonds with the skin to continue killing germs even after washing.    Oral Hygiene is also important to reduce your risk of infection.  Remember - BRUSH YOUR TEETH THE MORNING OF SURGERY WITH YOUR REGULAR TOOTHPASTE  Please do not use if you have an allergy to CHG or antibacterial soaps. If your skin becomes reddened/irritated stop using the CHG.  Do not shave (including legs and underarms) for at least 48 hours prior to first CHG shower. It  is OK to shave your face.  Please follow these instructions carefully.   Shower the NIGHT BEFORE SURGERY and the MORNING OF SURGERY  If you chose to wash your hair, wash your hair first as usual with your normal shampoo.  After you shampoo, rinse your hair and body thoroughly to remove the shampoo.  Use CHG Soap as you would any other liquid soap. You can apply CHG directly to the skin and wash gently with a scrungie or a clean washcloth.   Apply the CHG Soap to your body ONLY FROM THE NECK DOWN.  Do not use on open wounds or open sores. Avoid contact with your  eyes, ears, mouth and genitals (private parts). Wash Face and genitals (private parts)  with your normal soap.   Wash thoroughly, paying special attention to the area where your surgery will be performed.  Thoroughly rinse your body with warm water from the neck down.  DO NOT shower/wash with your normal soap after using and rinsing off the CHG Soap.  Pat yourself dry with a CLEAN TOWEL.  Wear CLEAN PAJAMAS to bed the night before surgery  Place CLEAN SHEETS on your bed the night before your surgery  DO NOT SLEEP WITH PETS.   Day of Surgery: Take a shower with CHG soap. Do not wear jewelry or makeup Do not wear lotions, powders, perfumes/colognes, or deodorant. Do not shave 48 hours prior to surgery.  Men may shave face and neck. Do not bring valuables to the hospital.  [facility] is not responsible for any belongings or valuables. Do not wear nail polish, gel polish, artificial nails, or any other type of covering on natural nails (fingers and toes) If you have artificial nails or gel coating that need to be removed by a nail salon, please have this removed prior to surgery. Artificial nails or gel coating may interfere with anesthesia's ability to adequately monitor your vital signs.  Wear Clean/Comfortable clothing the morning of surgery Remember to brush your teeth WITH YOUR REGULAR TOOTHPASTE.   Please read over the following fact sheets that you were given.    If you received a COVID test during your pre-op visit  it is requested that you wear a mask when out in public, stay away from anyone that may not be feeling well and notify your surgeon if you develop symptoms. If you have been in contact with anyone that has tested positive in the last 10 days please notify you surgeon.

## 2022-04-07 ENCOUNTER — Inpatient Hospital Stay (HOSPITAL_COMMUNITY)
Admission: RE | Admit: 2022-04-07 | Discharge: 2022-04-07 | Disposition: A | Discharge status: Home / Self Care | Payer: Medicaid Other | Source: Ambulatory Visit

## 2022-04-08 ENCOUNTER — Encounter (HOSPITAL_COMMUNITY): Payer: Self-pay

## 2022-04-09 ENCOUNTER — Other Ambulatory Visit: Payer: Self-pay

## 2022-04-09 ENCOUNTER — Encounter (HOSPITAL_COMMUNITY): Payer: Self-pay

## 2022-04-09 ENCOUNTER — Encounter (HOSPITAL_COMMUNITY)
Admission: RE | Admit: 2022-04-09 | Discharge: 2022-04-09 | Disposition: A | Discharge status: Home / Self Care | Payer: Medicaid Other | Source: Ambulatory Visit | Attending: Obstetrics and Gynecology | Admitting: Obstetrics and Gynecology

## 2022-04-09 ENCOUNTER — Telehealth: Payer: Self-pay | Admitting: Family Medicine

## 2022-04-09 VITALS — BP 154/126 | HR 98 | Temp 98.3°F | Resp 18 | Ht 64.0 in | Wt 266.5 lb

## 2022-04-09 DIAGNOSIS — I10 Essential (primary) hypertension: Secondary | ICD-10-CM | POA: Insufficient documentation

## 2022-04-09 DIAGNOSIS — N938 Other specified abnormal uterine and vaginal bleeding: Secondary | ICD-10-CM | POA: Diagnosis not present

## 2022-04-09 DIAGNOSIS — N83202 Unspecified ovarian cyst, left side: Secondary | ICD-10-CM | POA: Diagnosis not present

## 2022-04-09 DIAGNOSIS — K219 Gastro-esophageal reflux disease without esophagitis: Secondary | ICD-10-CM | POA: Diagnosis not present

## 2022-04-09 DIAGNOSIS — R000 Tachycardia, unspecified: Secondary | ICD-10-CM | POA: Diagnosis not present

## 2022-04-09 DIAGNOSIS — M50322 Other cervical disc degeneration at C5-C6 level: Secondary | ICD-10-CM | POA: Diagnosis not present

## 2022-04-09 DIAGNOSIS — J45909 Unspecified asthma, uncomplicated: Secondary | ICD-10-CM | POA: Diagnosis not present

## 2022-04-09 DIAGNOSIS — Z01812 Encounter for preprocedural laboratory examination: Secondary | ICD-10-CM | POA: Diagnosis not present

## 2022-04-09 DIAGNOSIS — Z6841 Body Mass Index (BMI) 40.0 and over, adult: Secondary | ICD-10-CM | POA: Insufficient documentation

## 2022-04-09 DIAGNOSIS — F32A Depression, unspecified: Secondary | ICD-10-CM | POA: Insufficient documentation

## 2022-04-09 DIAGNOSIS — G8929 Other chronic pain: Secondary | ICD-10-CM | POA: Insufficient documentation

## 2022-04-09 DIAGNOSIS — E039 Hypothyroidism, unspecified: Secondary | ICD-10-CM | POA: Diagnosis not present

## 2022-04-09 DIAGNOSIS — Z79899 Other long term (current) drug therapy: Secondary | ICD-10-CM | POA: Diagnosis not present

## 2022-04-09 DIAGNOSIS — F419 Anxiety disorder, unspecified: Secondary | ICD-10-CM | POA: Insufficient documentation

## 2022-04-09 DIAGNOSIS — Z01818 Encounter for other preprocedural examination: Secondary | ICD-10-CM

## 2022-04-09 DIAGNOSIS — N83209 Unspecified ovarian cyst, unspecified side: Secondary | ICD-10-CM

## 2022-04-09 HISTORY — DX: Gastro-esophageal reflux disease without esophagitis: K21.9

## 2022-04-09 HISTORY — DX: Other specified postprocedural states: Z98.890

## 2022-04-09 LAB — BASIC METABOLIC PANEL
Anion gap: 11 (ref 5–15)
BUN: 8 mg/dL (ref 6–20)
CO2: 22 mmol/L (ref 22–32)
Calcium: 9.2 mg/dL (ref 8.9–10.3)
Chloride: 107 mmol/L (ref 98–111)
Creatinine, Ser: 0.63 mg/dL (ref 0.44–1.00)
GFR, Estimated: >60 mL/min (ref >60)
Glucose, Bld: 100 mg/dL — ABNORMAL HIGH (ref 70–99)
Potassium: 3.7 mmol/L (ref 3.5–5.1)
Sodium: 140 mmol/L (ref 135–145)

## 2022-04-09 LAB — CBC
HCT: 36.8 % (ref 36.0–46.0)
Hemoglobin: 10.8 g/dL — ABNORMAL LOW (ref 12.0–15.0)
MCH: 22.6 pg — ABNORMAL LOW (ref 26.0–34.0)
MCHC: 29.3 g/dL — ABNORMAL LOW (ref 30.0–36.0)
MCV: 77.1 fL — ABNORMAL LOW (ref 80.0–100.0)
Platelets: 475 10*3/uL — ABNORMAL HIGH (ref 150–400)
RBC: 4.77 MIL/uL (ref 3.87–5.11)
RDW: 15.1 % (ref 11.5–15.5)
WBC: 10.8 10*3/uL — ABNORMAL HIGH (ref 4.0–10.5)
nRBC: 0 % (ref 0.0–0.2)

## 2022-04-09 NOTE — Telephone Encounter (Signed)
Pt was at the hospital getting her preadmission done for surgery and was told to reach out to her primary care provider because pt's blood pressure is high. Hospital recommended medication to lower the bp. Hospital says if pt's blood pressure remains high, pt's surgery could be canceled. Pt has surgery on 04/14/22. Please follow up with pt.

## 2022-04-09 NOTE — Progress Notes (Addendum)
PCP - Charlott Rakes Cardiologist - denies  PPM/ICD - denies  Chest x-ray -n/a  EKG - n/a Stress Test - denies ECHO - denies Cardiac Cath - denies  Sleep Study - denies  ERAS Protcol -yes PRE-SURGERY Ensure or G2- ensure given  COVID TEST- not needed   Anesthesia review: yes, elevated BP at PAT. Attempted to call Shelby Dubin. Day of appointment with no answer. Spoke with Deatra Canter who advised me to tell patient to contact PCP to get blood pressure/anxiety more under control. Patient voiced understanding. All questions answered.  Patient denies shortness of breath, fever, cough and chest pain at PAT appointment   All instructions explained to the patient, with a verbal understanding of the material. Patient agrees to go over the instructions while at home for a better understanding. Patient also instructed to self quarantine after being tested for COVID-19. The opportunity to ask questions was provided.

## 2022-04-09 NOTE — Telephone Encounter (Signed)
Patient will be seen in office on tomorrow for HTN.

## 2022-04-10 ENCOUNTER — Ambulatory Visit: Payer: Medicaid Other | Attending: Nurse Practitioner | Admitting: Nurse Practitioner

## 2022-04-10 ENCOUNTER — Encounter: Payer: Self-pay | Admitting: Nurse Practitioner

## 2022-04-10 VITALS — BP 136/92 | HR 127 | Ht 64.0 in | Wt 264.2 lb

## 2022-04-10 DIAGNOSIS — I10 Essential (primary) hypertension: Secondary | ICD-10-CM | POA: Diagnosis not present

## 2022-04-10 DIAGNOSIS — F419 Anxiety disorder, unspecified: Secondary | ICD-10-CM | POA: Diagnosis not present

## 2022-04-10 DIAGNOSIS — F32A Anxiety disorder, unspecified: Secondary | ICD-10-CM

## 2022-04-10 DIAGNOSIS — E071 Dyshormogenetic goiter: Secondary | ICD-10-CM

## 2022-04-10 MED ORDER — LEVOTHYROXINE SODIUM 100 MCG PO TABS: 100.0000 ug | ORAL_TABLET | Freq: Every day | ORAL | 1 refills | Status: DC

## 2022-04-10 MED ORDER — FLUOXETINE HCL 20 MG PO CAPS: ORAL_CAPSULE | ORAL | 1 refills | Status: DC

## 2022-04-10 MED ORDER — PROPRANOLOL HCL ER 60 MG PO CP24: 60.0000 mg | ORAL_CAPSULE | Freq: Every day | ORAL | 1 refills | Status: DC

## 2022-04-10 MED ORDER — BUSPIRONE HCL 10 MG PO TABS: 10.0000 mg | ORAL_TABLET | Freq: Two times a day (BID) | ORAL | 3 refills | Status: DC

## 2022-04-10 NOTE — Progress Notes (Signed)
High BP readings at home.  Laparoscopic sx scheduled.

## 2022-04-10 NOTE — Progress Notes (Signed)
Anesthesia Chart Review:  Case: [id number] Date/Time: 04/14/22 0925   Procedures:      LAPAROSCOPIC OVARIAN CYSTECTOMY     DILATATION AND CURETTAGE /HYSTEROSCOPY   Anesthesia type: Choice   Pre-op diagnosis:      Ovarian Cyst     DUB     Polyp   Location: [facility] OR ROOM 07 / [facility]   Surgeons: Chancy Milroy, MD       DISCUSSION: Patient is a 31 year old female scheduled for the above procedure.  History includes never smoker, post-operative N/V, GERD, hypothyroidism, asthma, migraines, sinus tachycardia, iron deficiency anemia, pregnancy induced hypertension, sciolosis with chronic back pain, ovarian cyst (s/p laparoscopic left ovarian cystectomy 02/11/16), morbid obesity.   BP 154/126 on 04/09/22. BP Readings from Last 3 Encounters:  04/09/22 (!) 154/126  12/15/21 (!) 142/99  11/17/21 (!) 160/114  She is not on any anti-hypertensive medications, so PCP office was contacted and scheduled to be seen a [facility] on 04/10/22 for re-evaluation and any management recommendations.  Will leave chart for follow-up.   ADDENDUM 04/13/22 11:03 AM: I reviewed office visit with Geryl Rankins, NP from 04/10/22 for HTN follow-up. BP there was 136/92 with HR 127. She was started on propranolol 60 mg daily. Notes 11/17/21 TSH elevated at 7.350. She reported taking levothyroxine with all of her medications, so provider asked her to change to taking 30 minutes prior. Thyroid panel re-checked and showed TSH elevated at 6.4 70 (H), but down since 09/01/21 and 11/17/21. Total T4 was mildly elevated at 12.6 (4.5-12.0) with normal T3 uptake ratio 27% and normal Free Thyroxine Index.   I have sent communication to Dr. Rip Harbour regarding PAT and PCP follow-up BP and HR readings. She should be able to continue propranolol for surgery. BP and HR re-check prior to the day of surgery would be ideal, but given surgery is tomorrow will defer to Dr. Rip Harbour if he prefers to delay surgery for  BP/HR recheck at PCP office versus recheck on the day of surgery. If results significantly elevated, then it is possible case would be delayed or cancelled but if results reasonable then would anticipate that she could proceed as planned,    VS: BP (!) 154/126   Pulse 98   Temp 36.8 C (Oral)   Resp 18   Ht 5\' 4"  (1.626 m)   Wt 120.9 kg   SpO2 100%   BMI 45.74 kg/m    PROVIDERS: Charlott Rakes, MD is PCP   LABS: Labs reviewed: Acceptable for surgery. She is for urine pregnancy test on arrival. (all labs ordered are listed, but only abnormal results are displayed)  Labs Reviewed  BASIC METABOLIC PANEL - Abnormal; Notable for the following components:      Result Value   Glucose, Bld 100 (*)    All other components within normal limits  CBC - Abnormal; Notable for the following components:   WBC 10.8 (*)    Hemoglobin 10.8 (*)    MCV 77.1 (*)    MCH 22.6 (*)    MCHC 29.3 (*)    Platelets 475 (*)    All other components within normal limits     IMAGES: MRI C-spine 08/17/21: IMPRESSION: The spinal cord appears normal. There is minimal disc bulging at C5-C6 and C6-C7 not leading to foraminal narrowing, spinal stenosis or nerve root compression.  US Pelvis 06/20/21: IMPRESSION: 1. Cystic structure in the left ovary measuring 11.9 x 10.3 x 9.3  cm, slightly increased in size from the prior exam. OBGYN consultation and/or MRI is recommended for further evaluation. 2. The uterus and right ovary are within normal limits.   EKG: Last EKG > 1 year ago. ST on 08/02/19.    CV: Long term Zio XT Monitor 05/16/18-05/19/18.  Normal sinus rhythm and sinus tachycardia. The average heart rate was 104bpm. The heart rate ranged from 63 to 182bpm.    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    Iron deficiency anemia    Migraines    "weekly" (11/16/2012)   Oligomenorrhea 03/01/2011   Ovarian cyst    PONV (postoperative nausea and vomiting)    Pregnancy induced hypertension    Schizophrenia ([place])    "moderate" (11/16/2012)   Scoliosis     Past Surgical History:  Procedure Laterality Date   CESAREAN SECTION N/A 08/02/2019   Procedure: CESAREAN SECTION;  Surgeon: Gwynne Edinger, MD;  Location: [facility];  Service: Obstetrics;  Laterality: N/A;   LAPAROSCOPIC OVARIAN CYSTECTOMY Left 02/11/2016   Procedure: LAPAROSCOPIC OVARIAN CYSTECTOMY;  Surgeon: Emily Filbert, MD;  Location: [place] [facility];  Service: Gynecology;  Laterality: Left;   UPPER GI ENDOSCOPY  ~ 2006; ~2008    MEDICATIONS:  doxycycline (VIBRA-TABS) 100 MG tablet   FLUoxetine (PROZAC) 20 MG capsule   levothyroxine (SYNTHROID) 100 MCG tablet   LORazepam (ATIVAN) 1 MG tablet   meclizine (ANTIVERT) 25 MG tablet   Misc. Devices MISC   neomycin-bacitracin-polymyxin (NEOSPORIN) OINT   omeprazole (PRILOSEC) 20 MG capsule   No current facility-administered medications for this encounter.    Myra Gianotti, PA-C Surgical Short Stay/Anesthesiology [facility] Phone [phone] [facility] Phone [phone] 04/10/2022 3:38 PM

## 2022-04-10 NOTE — Progress Notes (Signed)
Assessment & Plan:  Kimberly [name] was seen today for hypertension.  Diagnoses and all orders for this visit:  Primary hypertension START ON PROPANOLOL -     propranolol ER (INDERAL LA) 60 MG 24 hr capsule; Take 1 capsule (60 mg total) by mouth daily.  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 I did instruct her that with her thyroid levels being abnormal this could be contributing to her anxiety and blood pressure -     Thyroid Panel With TSH -     levothyroxine (SYNTHROID) 100 MCG tablet; Take 1 tablet (100 mcg total) by mouth daily.  Anxiety and depression Does not endorse any thoughts of self harm -     busPIRone (BUSPAR) 10 MG tablet; Take 1 tablet (10 mg total) by mouth 2 (two) times daily. -     Ambulatory referral to [facility] -     FLUoxetine (PROZAC) 20 MG capsule; TAKE 1 CAPSULE(20 MG) BY MOUTH DAILY    Patient has been counseled on age-appropriate routine health concerns for screening and prevention. These are reviewed and up-to-date. Referrals have been placed accordingly. Immunizations are up-to-date or declined.    Subjective:   Chief Complaint  Patient presents with   Hypertension   HPI Kimberly [name] 32 y.o. female presents to office today for follow up to HTN and anxiety.  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   She is accompanied by her mother today.  HTN Blood pressure is elevated. Her surgery for ovarian cyst removal has been postponed until her blood pressure is normalized. She attributes her high anxiety as a possible reason for her blood pressure  readings. BMI 45.  Notes home readings averaging 130-90s.  BP Readings from Last 3 Encounters:  04/10/22 (!) 136/92  04/09/22 (!) 154/126  12/15/21 (!) 142/99   Anxiety and Depression Currently taking prozac 20 mg daily. Was prescribed hydroxyzine in the past but states this made her dizziness worse.   Hypothyroidism Thyroid levels increased. Taking levothyroxine 100 mg daily however she is taking this with her other medications. She was instructed to take her thyroid medication by itself at least 30 minutes prior to her other medications.  Lab Results  Component Value Date   TSH 7.350 (H) 11/17/2021         Review of Systems  Constitutional:  Negative for fever, malaise/fatigue and weight loss.  HENT: Negative.  Negative for nosebleeds.   Eyes: Negative.  Negative for blurred vision, double vision and photophobia.  Respiratory: Negative.  Negative for cough and shortness of breath.   Cardiovascular: Negative.  Negative for chest pain, palpitations and leg swelling.  Gastrointestinal: Negative.  Negative for heartburn, nausea and vomiting.  Musculoskeletal: Negative.  Negative for myalgias.  Neurological: Negative.  Negative for dizziness, focal weakness, seizures and headaches.  Psychiatric/Behavioral:  Positive for depression. Negative for suicidal ideas. The patient is nervous/anxious.     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    Iron deficiency anemia    Migraines    "weekly" (11/16/2012)   Oligomenorrhea 03/01/2011   Ovarian cyst    PONV (postoperative nausea and vomiting)     Pregnancy induced hypertension    Schizophrenia ([place])    "moderate" (11/16/2012)   Scoliosis     Past Surgical History:  Procedure Laterality Date   CESAREAN SECTION N/A 08/02/2019   Procedure: CESAREAN SECTION;  Surgeon: Gwynne Edinger, MD;  Location: [facility];  Service: Obstetrics;  Laterality: N/A;   LAPAROSCOPIC OVARIAN CYSTECTOMY Left 02/11/2016   Procedure: LAPAROSCOPIC OVARIAN CYSTECTOMY;  Surgeon: Emily Filbert, MD;  Location: [facility];  Service: Gynecology;  Laterality: Left;   UPPER GI ENDOSCOPY  ~ 2006; ~2008    Family History  Problem Relation Age of Onset   Diabetes Mother    Hypertension Mother    Vision loss Mother    Hypertension Father    Hyperlipidemia Father    Thyroid disease Maternal Grandmother     Social History Reviewed with no changes to be made today.   Outpatient Medications Prior to Visit  Medication Sig Dispense Refill   doxycycline (VIBRA-TABS) 100 MG tablet Take 1 tablet (100 mg total) by mouth 2 (two) times daily. 20 tablet 0   meclizine (ANTIVERT) 25 MG tablet TAKE 1 TABLET(25 MG) BY MOUTH THREE TIMES DAILY AS NEEDED FOR DIZZINESS 90 tablet 11   FLUoxetine (PROZAC) 20 MG capsule TAKE 1 CAPSULE(20 MG) BY MOUTH DAILY 90 capsule 1   levothyroxine (SYNTHROID) 100 MCG tablet Take 1 tablet (100 mcg total) by mouth daily. 30 tablet 0   Misc. Devices MISC Adult pullups 2xl 2 packet 11   omeprazole (PRILOSEC) 20 MG capsule TAKE 1 CAPSULE(20 MG) BY MOUTH DAILY 90 capsule 1   LORazepam (ATIVAN) 1 MG tablet Take 1 tablet (1 mg total) by mouth Once PRN for up to 1 dose for anxiety (Please take before procedure). 1 tablet 0   neomycin-bacitracin-polymyxin (NEOSPORIN) OINT Apply 1 Application topically as needed for irritation. (Patient not taking: Reported on 04/10/2022)     No facility-administered medications prior to visit.    Allergies  Allergen Reactions   Cold & Cough Daytime [Acetaminophen-Dm] Shortness Of Breath    Could not tolerate- orange  liquid, may have contained Pseudoephedrine- "I could not tolerate"   Sulfa Antibiotics Swelling   Zofran [Ondansetron] Other (See Comments)    Muscle Spasms   Abilify [Aripiprazole] Other (See Comments)    Lethargy, unable to function   Amoxicillin-Pot Clavulanate Other (See Comments)    Stomach pains Has patient had a PCN reaction causing immediate rash, facial/tongue/throat swelling, SOB or lightheadedness with hypotension: No Has patient had a PCN reaction causing severe rash involving mucus membranes or skin necrosis: No Has patient had a PCN reaction that required hospitalization No Has patient had a PCN reaction occurring within the last 10 years: Yes If all of the above answers are "NO", then may proceed with Cephalosporin use.    Cephalexin Other (See Comments)    Stomach pains Pt taking Cefadroxil as outpatient   Latex Swelling and Other (See Comments)    Burning of skin, too   Phenergan [Promethazine]     'gave me twitches' 'not able to sit still'   Tape Swelling  Swelling, burning of skin .    Zicam Cold Remedy [Homeopathic Products] Nausea And Vomiting   Keflex [Cephalexin] Rash and Other (See Comments)    Rash--able to take w/ Benadryl   Vicodin [Hydrocodone-Acetaminophen] Hives and Rash    Per pt- makes her very sleepy, feels like she isn't getting her breath. States she does not have throat/tongue swelling or anaphylaxis.       Objective:    BP (!) 136/92 Comment: 114/99  Pulse (!) 127 Comment: 107  Ht 5\' 4"  (1.626 m)   Wt 264 lb 3.2 oz (119.8 kg)   LMP 04/09/2022 (Exact Date)   SpO2 100%   BMI 45.35 kg/m  Wt Readings from Last 3 Encounters:  04/10/22 264 lb 3.2 oz (119.8 kg)  04/09/22 266 lb 8 oz (120.9 kg)  12/15/21 267 lb 14.4 oz (121.5 kg)    Physical Exam Vitals and nursing note reviewed.  Constitutional:      Appearance: She is well-developed.  HENT:     Head: Normocephalic and atraumatic.  Cardiovascular:     Rate and Rhythm: Regular  rhythm. Tachycardia present.     Heart sounds: Normal heart sounds. No murmur heard.    No friction rub. No gallop.  Pulmonary:     Effort: Pulmonary effort is normal. No tachypnea or respiratory distress.     Breath sounds: Normal breath sounds. No decreased breath sounds, wheezing, rhonchi or rales.  Chest:     Chest wall: No tenderness.  Abdominal:     General: Bowel sounds are normal.     Palpations: Abdomen is soft.  Musculoskeletal:        General: Normal range of motion.     Cervical back: Normal range of motion.  Skin:    General: Skin is warm and dry.  Neurological:     Mental Status: She is alert and oriented to person, place, and time.     Coordination: Coordination normal.  Psychiatric:        Attention and Perception: Attention normal.        Mood and Affect: Affect is tearful.        Speech: Speech normal.        Behavior: Behavior normal. Behavior is cooperative.        Thought Content: Thought content normal. Thought content does not include homicidal or suicidal plan.        Cognition and Memory: Cognition normal.        Judgment: Judgment normal.          Patient has been counseled extensively about nutrition and exercise as well as the importance of adherence with medications and regular follow-up. The patient was given clear instructions to go to ER or return to medical center if symptoms don't improve, worsen or new problems develop. The patient verbalized understanding.   Follow-up: Return for 4-6 weeks Thyroid labs and BP check with [facility].   Gildardo Pounds, FNP-BC [facility] [place], [place]   04/10/2022, 4:21 PM

## 2022-04-11 ENCOUNTER — Other Ambulatory Visit: Payer: Self-pay | Admitting: Nurse Practitioner

## 2022-04-11 LAB — THYROID PANEL WITH TSH
Free Thyroxine Index: 3.4 (ref 1.2–4.9)
T3 Uptake Ratio: 27 % (ref 24–39)
T4, Total: 12.6 ug/dL — ABNORMAL HIGH (ref 4.5–12.0)
TSH: 6.47 u[IU]/mL — ABNORMAL HIGH (ref 0.450–4.500)

## 2022-04-12 ENCOUNTER — Encounter: Payer: Self-pay | Admitting: Obstetrics and Gynecology

## 2022-04-13 NOTE — Anesthesia Preprocedure Evaluation (Signed)
Anesthesia Evaluation  Patient identified by MRN, date of birth, ID band Patient awake    Reviewed: Allergy & Precautions, NPO status , Patient's Chart, lab work & pertinent test results  History of Anesthesia Complications (+) PONV and history of anesthetic complications  Airway Mallampati: I  TM Distance: >3 FB Neck ROM: Full    Dental  (+) Dental Advisory Given, Teeth Intact   Pulmonary asthma    Pulmonary exam normal breath sounds clear to auscultation       Cardiovascular hypertension, Pt. on home beta blockers and Pt. on medications Normal cardiovascular exam+ dysrhythmias  Rhythm:Regular Rate:Normal     Neuro/Psych  Headaches PSYCHIATRIC DISORDERS Anxiety Depression  Schizophrenia     GI/Hepatic Neg liver ROS,GERD  ,,  Endo/Other  diabetes, GestationalHypothyroidism  Morbid obesity (BMI 44)  Renal/GU negative Renal ROS     Musculoskeletal  (+) Arthritis ,    Abdominal  (+) + obese  Peds  Hematology  (+) Blood dyscrasia (Hgb 9.3), anemia   Anesthesia Other Findings   Reproductive/Obstetrics (+) Pregnancy                             Anesthesia Physical Anesthesia Plan  ASA: 3  Anesthesia Plan: General   Post-op Pain Management: Tylenol PO (pre-op)*, Gabapentin PO (pre-op)* and Toradol IV (intra-op)*   Induction: Intravenous  PONV Risk Score and Plan: 4 or greater and Treatment may vary due to age or medical condition, Dexamethasone and Midazolam  Airway Management Planned: Oral ETT  Additional Equipment: None  Intra-op Plan:   Post-operative Plan: Extubation in OR  Informed Consent: I have reviewed the patients History and Physical, chart, labs and discussed the procedure including the risks, benefits and alternatives for the proposed anesthesia with the patient or authorized representative who has indicated his/her understanding and acceptance.     Dental advisory  given  Plan Discussed with: CRNA  Anesthesia Plan Comments: (PAT note written by Myra Gianotti, PA-C.  )        Anesthesia Quick Evaluation

## 2022-04-14 ENCOUNTER — Ambulatory Visit (HOSPITAL_COMMUNITY)
Admission: RE | Admit: 2022-04-14 | Discharge: 2022-04-14 | Disposition: A | Discharge status: Home / Self Care | Payer: Medicaid Other | Attending: Obstetrics and Gynecology | Admitting: Obstetrics and Gynecology

## 2022-04-14 ENCOUNTER — Encounter (HOSPITAL_COMMUNITY)
Admission: RE | Disposition: A | Discharge status: Home / Self Care | Payer: Self-pay | Source: Home / Self Care | Attending: Obstetrics and Gynecology

## 2022-04-14 ENCOUNTER — Ambulatory Visit (HOSPITAL_BASED_OUTPATIENT_CLINIC_OR_DEPARTMENT_OTHER): Payer: Medicaid Other | Admitting: Certified Registered"

## 2022-04-14 ENCOUNTER — Other Ambulatory Visit: Payer: Self-pay

## 2022-04-14 ENCOUNTER — Encounter: Payer: Self-pay | Admitting: Obstetrics and Gynecology

## 2022-04-14 ENCOUNTER — Ambulatory Visit (HOSPITAL_COMMUNITY): Payer: Medicaid Other | Admitting: Vascular Surgery

## 2022-04-14 DIAGNOSIS — D271 Benign neoplasm of left ovary: Secondary | ICD-10-CM

## 2022-04-14 DIAGNOSIS — Z79899 Other long term (current) drug therapy: Secondary | ICD-10-CM | POA: Insufficient documentation

## 2022-04-14 DIAGNOSIS — N938 Other specified abnormal uterine and vaginal bleeding: Secondary | ICD-10-CM | POA: Diagnosis not present

## 2022-04-14 DIAGNOSIS — N888 Other specified noninflammatory disorders of cervix uteri: Secondary | ICD-10-CM | POA: Diagnosis not present

## 2022-04-14 DIAGNOSIS — N8302 Follicular cyst of left ovary: Secondary | ICD-10-CM | POA: Diagnosis not present

## 2022-04-14 DIAGNOSIS — Z302 Encounter for sterilization: Secondary | ICD-10-CM | POA: Diagnosis not present

## 2022-04-14 DIAGNOSIS — N840 Polyp of corpus uteri: Secondary | ICD-10-CM

## 2022-04-14 DIAGNOSIS — I10 Essential (primary) hypertension: Secondary | ICD-10-CM

## 2022-04-14 DIAGNOSIS — N83202 Unspecified ovarian cyst, left side: Secondary | ICD-10-CM | POA: Insufficient documentation

## 2022-04-14 DIAGNOSIS — J45909 Unspecified asthma, uncomplicated: Secondary | ICD-10-CM | POA: Insufficient documentation

## 2022-04-14 DIAGNOSIS — N83209 Unspecified ovarian cyst, unspecified side: Secondary | ICD-10-CM | POA: Diagnosis not present

## 2022-04-14 DIAGNOSIS — Z6841 Body Mass Index (BMI) 40.0 and over, adult: Secondary | ICD-10-CM | POA: Diagnosis not present

## 2022-04-14 DIAGNOSIS — Z01818 Encounter for other preprocedural examination: Secondary | ICD-10-CM

## 2022-04-14 DIAGNOSIS — D638 Anemia in other chronic diseases classified elsewhere: Secondary | ICD-10-CM | POA: Diagnosis not present

## 2022-04-14 DIAGNOSIS — E039 Hypothyroidism, unspecified: Secondary | ICD-10-CM | POA: Diagnosis not present

## 2022-04-14 DIAGNOSIS — N736 Female pelvic peritoneal adhesions (postinfective): Secondary | ICD-10-CM | POA: Diagnosis not present

## 2022-04-14 HISTORY — PX: LAPAROSCOPY: SHX197

## 2022-04-14 HISTORY — PX: LAPAROSCOPIC OVARIAN CYSTECTOMY: SHX6248

## 2022-04-14 HISTORY — PX: HYSTEROSCOPY WITH D & C: SHX1775

## 2022-04-14 HISTORY — PX: LAPAROSCOPIC LYSIS OF ADHESIONS: SHX5905

## 2022-04-14 LAB — POCT PREGNANCY, URINE: Preg Test, Ur: NEGATIVE

## 2022-04-14 SURGERY — EXCISION, CYST, OVARY, LAPAROSCOPIC
Anesthesia: General | Site: Vagina

## 2022-04-14 MED ORDER — ONDANSETRON HCL 4 MG/2ML IJ SOLN
INTRAMUSCULAR | Status: AC
  Filled 2022-04-14: qty 2

## 2022-04-14 MED ORDER — DEXAMETHASONE SODIUM PHOSPHATE 10 MG/ML IJ SOLN
INTRAMUSCULAR | Status: AC
  Filled 2022-04-14: qty 1

## 2022-04-14 MED ORDER — PROPOFOL 10 MG/ML IV BOLUS
INTRAVENOUS | Status: DC | PRN
  Administered 2022-04-14: 200 mg via INTRAVENOUS

## 2022-04-14 MED ORDER — AMISULPRIDE (ANTIEMETIC) 5 MG/2ML IV SOLN
10.0000 mg | Freq: Once | INTRAVENOUS | Status: AC | PRN
  Administered 2022-04-14: 10 mg via INTRAVENOUS

## 2022-04-14 MED ORDER — PROPOFOL 10 MG/ML IV BOLUS
INTRAVENOUS | Status: AC
  Filled 2022-04-14: qty 20

## 2022-04-14 MED ORDER — AMISULPRIDE (ANTIEMETIC) 5 MG/2ML IV SOLN
INTRAVENOUS | Status: AC
  Filled 2022-04-14: qty 4

## 2022-04-14 MED ORDER — LIDOCAINE 2% (20 MG/ML) 5 ML SYRINGE
INTRAMUSCULAR | Status: AC
  Filled 2022-04-14: qty 5

## 2022-04-14 MED ORDER — 0.9 % SODIUM CHLORIDE (POUR BTL) OPTIME
TOPICAL | Status: DC | PRN
  Administered 2022-04-14: 1000 mL

## 2022-04-14 MED ORDER — ROCURONIUM BROMIDE 10 MG/ML (PF) SYRINGE
PREFILLED_SYRINGE | INTRAVENOUS | Status: DC | PRN
  Administered 2022-04-14: 70 mg via INTRAVENOUS
  Administered 2022-04-14: 20 mg via INTRAVENOUS

## 2022-04-14 MED ORDER — KETOROLAC TROMETHAMINE 15 MG/ML IJ SOLN
15.0000 mg | INTRAMUSCULAR | Status: AC
  Administered 2022-04-14 (×2): 15 mg via INTRAVENOUS
  Filled 2022-04-14: qty 1

## 2022-04-14 MED ORDER — HYDROMORPHONE HCL 2 MG PO TABS: 1.0000 mg | ORAL_TABLET | Freq: Four times a day (QID) | ORAL | 0 refills | Status: DC | PRN

## 2022-04-14 MED ORDER — ROCURONIUM BROMIDE 10 MG/ML (PF) SYRINGE
PREFILLED_SYRINGE | INTRAVENOUS | Status: AC
  Filled 2022-04-14: qty 10

## 2022-04-14 MED ORDER — KETOROLAC TROMETHAMINE 30 MG/ML IJ SOLN
INTRAMUSCULAR | Status: AC
  Filled 2022-04-14: qty 1

## 2022-04-14 MED ORDER — FENTANYL CITRATE (PF) 250 MCG/5ML IJ SOLN
INTRAMUSCULAR | Status: AC
  Filled 2022-04-14: qty 5

## 2022-04-14 MED ORDER — ONDANSETRON HCL 4 MG/2ML IJ SOLN
INTRAMUSCULAR | Status: DC | PRN
  Administered 2022-04-14: 4 mg via INTRAVENOUS

## 2022-04-14 MED ORDER — DIPHENHYDRAMINE HCL 50 MG/ML IJ SOLN
INTRAMUSCULAR | Status: AC
  Filled 2022-04-14: qty 1

## 2022-04-14 MED ORDER — LACTATED RINGERS IV SOLN: INTRAVENOUS | Status: DC

## 2022-04-14 MED ORDER — SOD CITRATE-CITRIC ACID 500-334 MG/5ML PO SOLN
30.0000 mL | ORAL | Status: AC
  Administered 2022-04-14: 30 mL via ORAL
  Filled 2022-04-14: qty 30

## 2022-04-14 MED ORDER — MIDAZOLAM HCL 2 MG/2ML IJ SOLN
INTRAMUSCULAR | Status: AC
  Filled 2022-04-14: qty 2

## 2022-04-14 MED ORDER — DIPHENHYDRAMINE HCL 50 MG/ML IJ SOLN
INTRAMUSCULAR | Status: DC | PRN
  Administered 2022-04-14: 25 mg via INTRAVENOUS

## 2022-04-14 MED ORDER — MIDAZOLAM HCL 2 MG/2ML IJ SOLN
INTRAMUSCULAR | Status: DC | PRN
  Administered 2022-04-14: 2 mg via INTRAVENOUS

## 2022-04-14 MED ORDER — BUPIVACAINE HCL 0.5 % IJ SOLN
INTRAMUSCULAR | Status: DC | PRN
  Administered 2022-04-14: 17 mL

## 2022-04-14 MED ORDER — DEXAMETHASONE SODIUM PHOSPHATE 10 MG/ML IJ SOLN
INTRAMUSCULAR | Status: DC | PRN
  Administered 2022-04-14: 10 mg via INTRAVENOUS

## 2022-04-14 MED ORDER — HEMOSTATIC AGENTS (NO CHARGE) OPTIME
TOPICAL | Status: DC | PRN
  Administered 2022-04-14: 1 via TOPICAL

## 2022-04-14 MED ORDER — CHLORHEXIDINE GLUCONATE 0.12 % MT SOLN
15.0000 mL | Freq: Once | OROMUCOSAL | Status: AC
  Administered 2022-04-14: 15 mL via OROMUCOSAL
  Filled 2022-04-14: qty 15

## 2022-04-14 MED ORDER — ORAL CARE MOUTH RINSE: 15.0000 mL | Freq: Once | OROMUCOSAL | Status: AC

## 2022-04-14 MED ORDER — DOXYCYCLINE HYCLATE 100 MG IV SOLR
200.0000 mg | INTRAVENOUS | Status: AC
  Administered 2022-04-14: 200 mg via INTRAVENOUS
  Filled 2022-04-14: qty 200

## 2022-04-14 MED ORDER — IBUPROFEN 800 MG PO TABS: 800.0000 mg | ORAL_TABLET | Freq: Three times a day (TID) | ORAL | 0 refills | Status: DC | PRN

## 2022-04-14 MED ORDER — PHENYLEPHRINE 80 MCG/ML (10ML) SYRINGE FOR IV PUSH (FOR BLOOD PRESSURE SUPPORT)
PREFILLED_SYRINGE | INTRAVENOUS | Status: DC | PRN
  Administered 2022-04-14 (×3): 160 ug via INTRAVENOUS

## 2022-04-14 MED ORDER — PHENYLEPHRINE 80 MCG/ML (10ML) SYRINGE FOR IV PUSH (FOR BLOOD PRESSURE SUPPORT)
PREFILLED_SYRINGE | INTRAVENOUS | Status: AC
  Filled 2022-04-14: qty 10

## 2022-04-14 MED ORDER — POVIDONE-IODINE 10 % EX SWAB
2.0000 | Freq: Once | CUTANEOUS | Status: AC
  Administered 2022-04-14: 2 via TOPICAL

## 2022-04-14 MED ORDER — KETAMINE HCL 50 MG/5ML IJ SOSY
PREFILLED_SYRINGE | INTRAMUSCULAR | Status: AC
  Filled 2022-04-14: qty 5

## 2022-04-14 MED ORDER — LIDOCAINE 2% (20 MG/ML) 5 ML SYRINGE
INTRAMUSCULAR | Status: DC | PRN
  Administered 2022-04-14: 100 mg via INTRAVENOUS

## 2022-04-14 MED ORDER — EPHEDRINE SULFATE (PRESSORS) 50 MG/ML IJ SOLN
INTRAMUSCULAR | Status: DC | PRN
  Administered 2022-04-14 (×2): 5 mg via INTRAVENOUS

## 2022-04-14 MED ORDER — MEPERIDINE HCL 25 MG/ML IJ SOLN: 6.2500 mg | INTRAMUSCULAR | Status: DC | PRN

## 2022-04-14 MED ORDER — FENTANYL CITRATE (PF) 250 MCG/5ML IJ SOLN
INTRAMUSCULAR | Status: DC | PRN
  Administered 2022-04-14 (×3): 50 ug via INTRAVENOUS
  Administered 2022-04-14: 100 ug via INTRAVENOUS

## 2022-04-14 MED ORDER — SUGAMMADEX SODIUM 200 MG/2ML IV SOLN
INTRAVENOUS | Status: DC | PRN
  Administered 2022-04-14: 200 mg via INTRAVENOUS

## 2022-04-14 MED ORDER — GABAPENTIN 300 MG PO CAPS
300.0000 mg | ORAL_CAPSULE | Freq: Once | ORAL | Status: AC
  Administered 2022-04-14: 300 mg via ORAL
  Filled 2022-04-14: qty 1

## 2022-04-14 MED ORDER — HYDROMORPHONE HCL 1 MG/ML IJ SOLN: 0.2500 mg | INTRAMUSCULAR | Status: DC | PRN

## 2022-04-14 MED ORDER — BUPIVACAINE HCL (PF) 0.5 % IJ SOLN
INTRAMUSCULAR | Status: AC
  Filled 2022-04-14: qty 30

## 2022-04-14 SURGICAL SUPPLY — 57 items
ADH SKN CLS APL DERMABOND .7 (GAUZE/BANDAGES/DRESSINGS) ×3
APL SRG 38 LTWT LNG FL B (MISCELLANEOUS) ×3
APPLICATOR ARISTA FLEXITIP XL (MISCELLANEOUS) IMPLANT
CNTNR URN SCR LID CUP LEK RST (MISCELLANEOUS) IMPLANT
CONT SPEC 4OZ STRL OR WHT (MISCELLANEOUS) ×9
DERMABOND ADVANCED .7 DNX12 (GAUZE/BANDAGES/DRESSINGS) ×4 IMPLANT
DRSG OPSITE POSTOP 3X4 (GAUZE/BANDAGES/DRESSINGS) ×4 IMPLANT
DURAPREP 26ML APPLICATOR (WOUND CARE) ×4 IMPLANT
ELECT REM PT RETURN 9FT ADLT (ELECTROSURGICAL) ×3
ELECTRODE REM PT RTRN 9FT ADLT (ELECTROSURGICAL) IMPLANT
GLOVE BIOGEL PI IND STRL 7.0 (GLOVE) IMPLANT
GLOVE BIOGEL PI IND STRL 8 (GLOVE) IMPLANT
GLOVE SURG SYN 7.5  E (GLOVE) ×6
GLOVE SURG SYN 7.5 E (GLOVE) ×6 IMPLANT
GLOVE SURG SYN 7.5 PF PI (GLOVE) IMPLANT
GLOVE SURG UNDER POLY LF SZ7 (GLOVE) ×4 IMPLANT
GOWN STRL REUS W/ TWL LRG LVL3 (GOWN DISPOSABLE) ×8 IMPLANT
GOWN STRL REUS W/ TWL XL LVL3 (GOWN DISPOSABLE) ×4 IMPLANT
GOWN STRL REUS W/TWL LRG LVL3 (GOWN DISPOSABLE) ×6
GOWN STRL REUS W/TWL XL LVL3 (GOWN DISPOSABLE) ×9
HEMOSTAT ARISTA ABSORB 3G PWDR (HEMOSTASIS) IMPLANT
IRRIG SUCT STRYKERFLOW 2 WTIP (MISCELLANEOUS) ×3
IRRIGATION SUCT STRKRFLW 2 WTP (MISCELLANEOUS) ×4 IMPLANT
KIT PINK PAD W/HEAD ARE REST (MISCELLANEOUS) ×3
KIT PINK PAD W/HEAD ARM REST (MISCELLANEOUS) ×4 IMPLANT
KIT PROCEDURE FLUENT (KITS) ×4 IMPLANT
KIT TURNOVER KIT B (KITS) ×4 IMPLANT
NDL INSUFF ACCESS 14 VERSASTEP (NEEDLE) IMPLANT
NS IRRIG 1000ML POUR BTL (IV SOLUTION) ×4 IMPLANT
PACK LAPAROSCOPY BASIN (CUSTOM PROCEDURE TRAY) ×4 IMPLANT
PACK VAGINAL MINOR WOMEN LF (CUSTOM PROCEDURE TRAY) ×4 IMPLANT
PAD OB MATERNITY 4.3X12.25 (PERSONAL CARE ITEMS) ×4 IMPLANT
POUCH LAPAROSCOPIC INSTRUMENT (MISCELLANEOUS) IMPLANT
PROTECTOR NERVE ULNAR (MISCELLANEOUS) ×8 IMPLANT
SCISSORS LAP 5X35 DISP (ENDOMECHANICALS) IMPLANT
SET TUBE SMOKE EVAC HIGH FLOW (TUBING) ×4 IMPLANT
SHEARS HARMONIC ACE PLUS 36CM (ENDOMECHANICALS) IMPLANT
SLEEVE ADV FIXATION 5X100MM (TROCAR) IMPLANT
SLEEVE XCEL OPT CAN 5 100 (ENDOMECHANICALS) ×4 IMPLANT
SLEEVE Z-THREAD 5X100MM (TROCAR) IMPLANT
SUT MNCRL AB 4-0 PS2 18 (SUTURE) ×4 IMPLANT
SUT VICRYL 0 UR6 27IN ABS (SUTURE) ×4 IMPLANT
SYR 30ML LL (SYRINGE) IMPLANT
SYS BAG RETRIEVAL 10MM (BASKET)
SYSTEM BAG RETRIEVAL 10MM (BASKET) IMPLANT
SYSTEM CARTER THOMASON II (TROCAR) IMPLANT
TOWEL GREEN STERILE FF (TOWEL DISPOSABLE) ×8 IMPLANT
TRAY FOL W/BAG SLVR 16FR STRL (SET/KITS/TRAYS/PACK) IMPLANT
TRAY FOLEY W/BAG SLVR 14FR (SET/KITS/TRAYS/PACK) ×4 IMPLANT
TRAY FOLEY W/BAG SLVR 16FR LF (SET/KITS/TRAYS/PACK) ×3
TROCAR ADV FIXATION 5X100MM (TROCAR) IMPLANT
TROCAR BALLN 12MMX100 BLUNT (TROCAR) ×4 IMPLANT
TROCAR VERSASTEP PLUS 12MM (TROCAR) IMPLANT
TROCAR VERSASTEP PLUS 5MM (TROCAR) IMPLANT
TROCAR XCEL NON-BLD 5MMX100MML (ENDOMECHANICALS) ×4 IMPLANT
WARMER LAPAROSCOPE (MISCELLANEOUS) ×4 IMPLANT
WATER STERILE IRR 1000ML POUR (IV SOLUTION) IMPLANT

## 2022-04-14 NOTE — Anesthesia Postprocedure Evaluation (Signed)
Anesthesia Post Note  Patient: Kimberly [name]  Procedure(s) Performed: LAPAROSCOPIC OVARIAN CYSTECTOMY (Left: Abdomen) DILATATION AND CURETTAGE (Vagina ) LAPAROSCOPIC LYSIS OF ADHESIONS (Abdomen) LAPAROSCOPY DIAGNOSTIC (Abdomen)     Patient location during evaluation: PACU Anesthesia Type: General Level of consciousness: sedated and patient cooperative Pain management: pain level controlled Vital Signs Assessment: post-procedure vital signs reviewed and stable Respiratory status: spontaneous breathing Cardiovascular status: stable Anesthetic complications: no   No notable events documented.  Last Vitals:  Vitals:   04/14/22 1330 04/14/22 1345  BP: 122/77 116/70  Pulse: 82 85  Resp: 17 16  Temp:  (!) 36.4 C  SpO2: 94% 96%    Last Pain:  Vitals:   04/14/22 1330  TempSrc:   PainSc: [place]

## 2022-04-14 NOTE — Interval H&P Note (Signed)
History and Physical Interval Note:  04/14/2022 9:52 AM  Kimberly [name]  has presented today for surgery, with the diagnosis of Ovarian Cyst DUB Polyp.  The various methods of treatment have been discussed with the patient and family. After consideration of risks, benefits and other options for treatment, the patient has consented to  Procedure(s): LAPAROSCOPIC OVARIAN CYSTECTOMY (N/A) DILATATION AND CURETTAGE /HYSTEROSCOPY (N/A) as a surgical intervention.  The patient's history has been reviewed, patient examined, no change in status, stable for surgery.  I have reviewed the patient's chart and labs.  Questions were answered to the patient's satisfaction.     Chancy Milroy

## 2022-04-14 NOTE — Transfer of Care (Signed)
Immediate Anesthesia Transfer of Care Note  Patient: Kimberly [name]  Procedure(s) Performed: LAPAROSCOPIC OVARIAN CYSTECTOMY (Left: Abdomen) DILATATION AND CURETTAGE (Vagina ) LAPAROSCOPIC LYSIS OF ADHESIONS (Abdomen) LAPAROSCOPY DIAGNOSTIC (Abdomen)  Patient Location: PACU  Anesthesia Type:General  Level of Consciousness: awake and sedated  Airway & Oxygen Therapy: Patient Spontanous Breathing and Patient connected to nasal cannula oxygen  Post-op Assessment: Report given to RN and Post -op Vital signs reviewed and stable  Post vital signs: Reviewed and stable  Last Vitals:  Vitals Value Taken Time  BP 127/84 04/14/22 1303  Temp    Pulse 91 04/14/22 1306  Resp 18 04/14/22 1306  SpO2 92 % 04/14/22 1306  Vitals shown include unvalidated device data.  Last Pain:  Vitals:   04/14/22 0828  TempSrc:   PainSc: 0-No pain         Complications: No notable events documented.

## 2022-04-14 NOTE — Anesthesia Procedure Notes (Signed)
Procedure Name: Intubation Date/Time: 04/14/2022 10:26 AM  Performed by: Reggie Pile, CRNAPre-anesthesia Checklist: Patient identified, Emergency Drugs available, Suction available and Patient being monitored Patient Re-evaluated:Patient Re-evaluated prior to induction Oxygen Delivery Method: Circle system utilized Preoxygenation: Pre-oxygenation with 100% oxygen Induction Type: IV induction Ventilation: Mask ventilation without difficulty Laryngoscope Size: Miller and 2 Grade View: Grade I Tube type: Oral Tube size: 7.0 mm Number of attempts: 1 Airway Equipment and Method: Stylet and Oral airway Placement Confirmation: ETT inserted through vocal cords under direct vision, positive ETCO2 and breath sounds checked- equal and bilateral Secured at: 22 cm Tube secured with: Tape Dental Injury: Teeth and Oropharynx as per pre-operative assessment

## 2022-04-15 ENCOUNTER — Encounter (HOSPITAL_COMMUNITY): Payer: Self-pay | Admitting: Obstetrics and Gynecology

## 2022-04-15 LAB — SURGICAL PATHOLOGY

## 2022-04-15 LAB — CYTOLOGY - NON PAP

## 2022-04-16 NOTE — Op Note (Signed)
Kimberly [name] 04/14/2022  PREOPERATIVE DIAGNOSIS:  Probable Endometrial polyp, ovarian cyst  POSTOPERATIVE DIAGNOSIS:  Same as above plus pelvic adhesions  PROCEDURE:  Uterine Dilatation and curettage Laparoscopic Ovarian Cystectomy and Lysis of adhesions   ANESTHESIA:  General endotracheal  SURGEON: Legrand Como L. Rip Harbour, MD  Assistant : Aletha Halim, MD  An experienced assistant was required given the standard of surgical care given the complexity of the case.  This assistant was needed for exposure, dissection, suctioning, retraction, instrument exchange,  and for overall help during the procedure.   COMPLICATIONS:  None immediate.  ESTIMATED BLOOD LOSS:  Less than 20 ml.  FLUIDS: As recorded  URINE OUTPUT:  As recorded  INDICATIONS: 32 y.o. [id number]  with undesired fertility, desires permanent sterilization. Other reversible forms of contraception were discussed with patient; she declines all other modalities.  Risks of procedure discussed with patient including permanence of method, bleeding, infection, injury to surrounding organs and need for additional procedures including laparotomy, risk of regret.  Failure risk of 0.5-1% with increased risk of ectopic gestation if pregnancy occurs was also discussed with patient.      FINDINGS:  Stenotic cervical Os, Pelvic adhesions involving the uterus and pelvic side walls and anterior abd wall. Simple appearing ovarian cyst  @ 15 x 14 cm.   TECHNIQUE:  The patient was taken to the operating room where general anesthesia was obtained without difficulty.  She was then placed in the dorsal lithotomy position and prepared and draped in sterile fashion.  After an adequate timeout was performed, a bivalved speculum was then placed in the patient's vagina, and the anterior lip of cervix grasped with the single-tooth tenaculum.  The cervical was dilated and sharp uterine curettage was performed. Endometrial tissue was passed off for pathology. Then  uterine manipulator was then advanced into the uterus. The single tooth tenaculum was removed.  The speculum was removed from the vagina.  The surgeon then changed sterile gloves.  Attention was then turned to the patient's abdomen where a 11-mm skin incision was made in the umbilical fold.  The Optiview 11-mm trocar and sleeve were then advanced without difficulty with the laparoscope under direct visualization into the abdomen.  The abdomen was then insufflated with carbon dioxide gas and adequate pneumoperitoneum was obtained.  A survey of the patient's pelvis and abdomen revealed the above findings. Additional 5 mm ports were placed under direct visualization in the right and left lower quadrants and in the prior c section incision, midline. Pelvic washings were obtained. A sample of the ovarian cyst fluid was obtained. The washings and cyst fluid  were sent to pathology.The Harmonic scalpel along with blunt dissection was used to take down the adhesions.  A small liner incision was made along the ovarian parenchyma using the Harmonic scalpel. During which the cyst ruptured. Using blunt graspers the ovarian parenchyma was grasped. The cyst wall was then bluntly dissected away from the ovarian tissue. Once the cyst wall was completely dissected away from the ovarian tissue, it was removed thru a 5 MM port and past off to pathology.   The bed of the dissected ovarian tissue was cover with Asteria. Hemostasis was noted. The instruments were then removed from the patient's abdomen and the fascial incision was repaired with 0 Vicryl, and the skin was closed with Dermabond.  The uterine manipulator and the tenaculum were removed from the vagina without complications. The patient tolerated the procedure well.  Sponge, lap, and needle counts were correct times  two.  The patient was then taken to the recovery room awake, extubated and in stable condition.   Arlina Robes, MD

## 2022-04-20 ENCOUNTER — Encounter: Payer: Self-pay | Admitting: *Deleted

## 2022-04-20 DIAGNOSIS — R32 Unspecified urinary incontinence: Secondary | ICD-10-CM | POA: Diagnosis not present

## 2022-04-20 DIAGNOSIS — Z419 Encounter for procedure for purposes other than remedying health state, unspecified: Secondary | ICD-10-CM | POA: Diagnosis not present

## 2022-04-23 ENCOUNTER — Other Ambulatory Visit: Payer: Self-pay

## 2022-04-23 ENCOUNTER — Ambulatory Visit (INDEPENDENT_AMBULATORY_CARE_PROVIDER_SITE_OTHER): Payer: Medicaid Other

## 2022-04-23 VITALS — BP 110/62 | HR 103 | Temp 98.3°F | Wt 264.3 lb

## 2022-04-23 DIAGNOSIS — Z5189 Encounter for other specified aftercare: Secondary | ICD-10-CM

## 2022-04-23 DIAGNOSIS — T8149XA Infection following a procedure, other surgical site, initial encounter: Secondary | ICD-10-CM

## 2022-04-23 MED ORDER — SULFAMETHOXAZOLE-TRIMETHOPRIM 800-160 MG PO TABS: 1.0000 | ORAL_TABLET | Freq: Two times a day (BID) | ORAL | 0 refills | Status: DC

## 2022-04-23 NOTE — Progress Notes (Signed)
Incision Check Visit  Kimberly [name] is here for incision check following lap ovarian cystectomy on 04/14/22. Pt states pain at umbilical incision increased 2 days ago. Incision is open to air. Surrounding area is reddened, indurated, and warm to touch. All other incision appear clean, dry, and intact. Currently taking ibuprofen and Tylenol with some improvement to pain. Roselie Awkward MD to bedside for assessment. Provider states this appears to be cellulitis and recommends course of Bactrim DS (verbal order for Bactrim DS BID x 7 days). Reviewed good wound care and s/s of infection with patient. Will call back if doesn't notice improvement after taking antibiotic. Plan to return for post op 05/11/22.  Annabell Howells, RN 04/23/2022  9:33 AM

## 2022-04-26 ENCOUNTER — Encounter: Payer: Self-pay | Admitting: Obstetrics and Gynecology

## 2022-04-28 ENCOUNTER — Observation Stay (HOSPITAL_COMMUNITY)
Admission: EM | Admit: 2022-04-28 | Discharge: 2022-04-29 | Disposition: A | Discharge status: Home / Self Care | Payer: Medicaid Other | Attending: Obstetrics and Gynecology | Admitting: Obstetrics and Gynecology

## 2022-04-28 ENCOUNTER — Ambulatory Visit: Payer: Medicaid Other | Admitting: Obstetrics and Gynecology

## 2022-04-28 ENCOUNTER — Other Ambulatory Visit: Payer: Self-pay

## 2022-04-28 ENCOUNTER — Emergency Department (HOSPITAL_COMMUNITY): Payer: Medicaid Other

## 2022-04-28 ENCOUNTER — Telehealth: Payer: Self-pay | Admitting: Family Medicine

## 2022-04-28 DIAGNOSIS — Y793 Surgical instruments, materials and orthopedic devices (including sutures) associated with adverse incidents: Secondary | ICD-10-CM | POA: Insufficient documentation

## 2022-04-28 DIAGNOSIS — L03311 Cellulitis of abdominal wall: Secondary | ICD-10-CM | POA: Diagnosis not present

## 2022-04-28 DIAGNOSIS — Z91040 Latex allergy status: Secondary | ICD-10-CM | POA: Diagnosis not present

## 2022-04-28 DIAGNOSIS — L03316 Cellulitis of umbilicus: Secondary | ICD-10-CM | POA: Insufficient documentation

## 2022-04-28 DIAGNOSIS — T8149XA Infection following a procedure, other surgical site, initial encounter: Principal | ICD-10-CM | POA: Diagnosis present

## 2022-04-28 DIAGNOSIS — Z79899 Other long term (current) drug therapy: Secondary | ICD-10-CM | POA: Insufficient documentation

## 2022-04-28 DIAGNOSIS — E039 Hypothyroidism, unspecified: Secondary | ICD-10-CM | POA: Diagnosis not present

## 2022-04-28 DIAGNOSIS — J45909 Unspecified asthma, uncomplicated: Secondary | ICD-10-CM | POA: Diagnosis not present

## 2022-04-28 DIAGNOSIS — R161 Splenomegaly, not elsewhere classified: Secondary | ICD-10-CM | POA: Diagnosis not present

## 2022-04-28 LAB — CBC WITH DIFFERENTIAL/PLATELET
Abs Immature Granulocytes: 0.09 10*3/uL — ABNORMAL HIGH (ref 0.00–0.07)
Basophils Absolute: 0 10*3/uL (ref 0.0–0.1)
Basophils Relative: 0 %
Eosinophils Absolute: 0.2 10*3/uL (ref 0.0–0.5)
Eosinophils Relative: 2 %
HCT: 29.9 % — ABNORMAL LOW (ref 36.0–46.0)
Hemoglobin: 8.5 g/dL — ABNORMAL LOW (ref 12.0–15.0)
Immature Granulocytes: 1 %
Lymphocytes Relative: 16 %
Lymphs Abs: 2 10*3/uL (ref 0.7–4.0)
MCH: 22.3 pg — ABNORMAL LOW (ref 26.0–34.0)
MCHC: 28.4 g/dL — ABNORMAL LOW (ref 30.0–36.0)
MCV: 78.3 fL — ABNORMAL LOW (ref 80.0–100.0)
Monocytes Absolute: 0.9 10*3/uL (ref 0.1–1.0)
Monocytes Relative: 7 %
Neutro Abs: 9.3 10*3/uL — ABNORMAL HIGH (ref 1.7–7.7)
Neutrophils Relative %: 74 %
Platelets: 506 10*3/uL — ABNORMAL HIGH (ref 150–400)
RBC: 3.82 MIL/uL — ABNORMAL LOW (ref 3.87–5.11)
RDW: 16.5 % — ABNORMAL HIGH (ref 11.5–15.5)
WBC: 12.5 10*3/uL — ABNORMAL HIGH (ref 4.0–10.5)
nRBC: 0 % (ref 0.0–0.2)

## 2022-04-28 LAB — COMPREHENSIVE METABOLIC PANEL
ALT: 21 U/L (ref 0–44)
AST: 20 U/L (ref 15–41)
Albumin: 2.7 g/dL — ABNORMAL LOW (ref 3.5–5.0)
Alkaline Phosphatase: 111 U/L (ref 38–126)
Anion gap: 11 (ref 5–15)
BUN: 9 mg/dL (ref 6–20)
CO2: 20 mmol/L — ABNORMAL LOW (ref 22–32)
Calcium: 8.5 mg/dL — ABNORMAL LOW (ref 8.9–10.3)
Chloride: 105 mmol/L (ref 98–111)
Creatinine, Ser: 0.57 mg/dL (ref 0.44–1.00)
GFR, Estimated: >60 mL/min (ref >60)
Glucose, Bld: 97 mg/dL (ref 70–99)
Potassium: 3.6 mmol/L (ref 3.5–5.1)
Sodium: 136 mmol/L (ref 135–145)
Total Bilirubin: 0.2 mg/dL — ABNORMAL LOW (ref 0.3–1.2)
Total Protein: 7.3 g/dL (ref 6.5–8.1)

## 2022-04-28 LAB — URINALYSIS, ROUTINE W REFLEX MICROSCOPIC
Glucose, UA: NEGATIVE mg/dL
Hgb urine dipstick: NEGATIVE
Ketones, ur: NEGATIVE mg/dL
Nitrite: NEGATIVE
Protein, ur: 30 mg/dL — AB
Specific Gravity, Urine: 1.027 (ref 1.005–1.030)
pH: 5 (ref 5.0–8.0)

## 2022-04-28 LAB — I-STAT BETA HCG BLOOD, ED (MC, WL, AP ONLY): I-stat hCG, quantitative: 15.1 m[IU]/mL — ABNORMAL HIGH (ref <5)

## 2022-04-28 LAB — LACTIC ACID, PLASMA: Lactic Acid, Venous: 1.3 mmol/L (ref 0.5–1.9)

## 2022-04-28 LAB — AEROBIC CULTURE W GRAM STAIN (SUPERFICIAL SPECIMEN)

## 2022-04-28 LAB — PREGNANCY, URINE: Preg Test, Ur: NEGATIVE

## 2022-04-28 MED ORDER — ALUM & MAG HYDROXIDE-SIMETH 200-200-20 MG/5ML PO SUSP: 30.0000 mL | ORAL | Status: DC | PRN

## 2022-04-28 MED ORDER — IOHEXOL 350 MG/ML SOLN
75.0000 mL | Freq: Once | INTRAVENOUS | Status: AC | PRN
  Administered 2022-04-28: 75 mL via INTRAVENOUS

## 2022-04-28 MED ORDER — CEFAZOLIN SODIUM-DEXTROSE 2-4 GM/100ML-% IV SOLN
2.0000 g | Freq: Three times a day (TID) | INTRAVENOUS | Status: DC
  Administered 2022-04-28 – 2022-04-29 (×3): 2 g via INTRAVENOUS
  Filled 2022-04-28 (×3): qty 100

## 2022-04-28 MED ORDER — IBUPROFEN 200 MG PO TABS: 600.0000 mg | ORAL_TABLET | Freq: Four times a day (QID) | ORAL | Status: DC | PRN

## 2022-04-28 MED ORDER — OXYCODONE HCL 5 MG PO TABS
5.0000 mg | ORAL_TABLET | Freq: Once | ORAL | Status: AC
  Administered 2022-04-28: 5 mg via ORAL
  Filled 2022-04-28: qty 1

## 2022-04-28 MED ORDER — KETOROLAC TROMETHAMINE 15 MG/ML IJ SOLN
15.0000 mg | Freq: Once | INTRAMUSCULAR | Status: AC
  Administered 2022-04-28: 15 mg via INTRAVENOUS
  Filled 2022-04-28: qty 1

## 2022-04-28 MED ORDER — LACTATED RINGERS IV SOLN: INTRAVENOUS | Status: DC

## 2022-04-28 MED ORDER — PRENATAL MULTIVITAMIN CH
1.0000 | ORAL_TABLET | Freq: Every day | ORAL | Status: DC
  Administered 2022-04-29: 1 via ORAL
  Filled 2022-04-28: qty 1

## 2022-04-28 MED ORDER — OXYCODONE HCL 5 MG PO TABS: 5.0000 mg | ORAL_TABLET | Freq: Four times a day (QID) | ORAL | Status: DC | PRN

## 2022-04-28 MED ORDER — METOCLOPRAMIDE HCL 5 MG/ML IJ SOLN
10.0000 mg | Freq: Four times a day (QID) | INTRAMUSCULAR | Status: DC | PRN
  Administered 2022-04-28 – 2022-04-29 (×3): 10 mg via INTRAVENOUS
  Filled 2022-04-28 (×3): qty 2

## 2022-04-28 NOTE — ED Provider Notes (Signed)
[facility]   CSN: [id number] Arrival date & time: 04/28/22  1011     History  No chief complaint on file.   Kimberly [name] is a 32 y.o. female.  Patient with history of PCOS with uterine d&c and laparoscopic ovarian cystectomy and lysis of adhesions performed by Dr. Alysia PennaErvin on 04/14/22 presents today with concerns for post op infection. Kimberly [name] states that on 4/02 Kimberly [name] started having redness around her incision site and went to her surgeons office for follow-up on 4/04 and was diagnosed with cellulitis and started on Bactrim. Kimberly [name] has been taking this as prescribed and has 2 doses left.  Kimberly [name] states that since then the redness has somewhat improved, however yesterday Kimberly [name] noticed that her naval incision was draining cloudy fluid. States that Kimberly [name] has been taking tylenol and ibuprofen around the clock for her pain and Kimberly [name] thinks that is why Kimberly [name] has not been febrile because yesterday was the first day Kimberly [name] didn't take the medication and her temperature was 100. Kimberly [name] also endorses nausea and 1 episode of NBNB emesis earlier today. Denies diarrhea or urinary symptoms. States that when Kimberly [name] had a c section in 2021 Kimberly [name] had a post op infection and was septic and is concerned for same.  The history is provided by the patient. No language interpreter was used.       Home Medications Prior to Admission medications   Medication Sig Start Date End Date Taking? Authorizing Provider  busPIRone (BUSPAR) 10 MG tablet Take 1 tablet (10 mg total) by mouth 2 (two) times daily. 04/10/22   Claiborne RiggFleming, Zelda W, NP  FLUoxetine (PROZAC) 20 MG capsule TAKE 1 CAPSULE(20 MG) BY MOUTH DAILY 04/10/22   Claiborne RiggFleming, Zelda W, NP  HYDROmorphone (DILAUDID) 2 MG tablet Take 0.5 tablets (1 mg total) by mouth every 6 (six) hours as needed for severe pain. 04/14/22   Hermina StaggersErvin, Michael L, MD  ibuprofen (ADVIL) 800 MG tablet Take 1 tablet (800 mg total) by mouth every 8 (eight) hours as needed.  04/14/22   Hermina StaggersErvin, Michael L, MD  levothyroxine (SYNTHROID) 100 MCG tablet Take 1 tablet (100 mcg total) by mouth daily. 04/10/22   Claiborne RiggFleming, Zelda W, NP  meclizine (ANTIVERT) 25 MG tablet TAKE 1 TABLET(25 MG) BY MOUTH THREE TIMES DAILY AS NEEDED FOR DIZZINESS 09/01/21   Hoy RegisterNewlin, Enobong, MD  omeprazole (PRILOSEC) 20 MG capsule TAKE 1 CAPSULE(20 MG) BY MOUTH DAILY 03/20/22   Hoy RegisterNewlin, Enobong, MD  propranolol ER (INDERAL LA) 60 MG 24 hr capsule Take 1 capsule (60 mg total) by mouth daily. 04/10/22   Claiborne RiggFleming, Zelda W, NP  sulfamethoxazole-trimethoprim (BACTRIM DS) 800-160 MG tablet Take 1 tablet by mouth 2 (two) times daily. 04/23/22   Adam PhenixArnold, James G, MD      Allergies    Cold & cough daytime [acetaminophen-dm], Zofran [ondansetron], Abilify [aripiprazole], Amoxicillin-pot clavulanate, Cephalexin, Latex, Phenergan [promethazine], Tape, Zicam cold remedy [homeopathic products], Keflex [cephalexin], and Vicodin [hydrocodone-acetaminophen]    Review of Systems   Review of Systems  Gastrointestinal:  Positive for abdominal pain.  All other systems reviewed and are negative.      Physical Exam Updated Vital Signs LMP 04/09/2022 (Exact Date)  Physical Exam Vitals and nursing note reviewed.  Constitutional:      General: Kimberly [name] is not in acute distress.    Appearance: Normal appearance. Kimberly [name] is normal weight. Kimberly [name] is not ill-appearing, toxic-appearing or diaphoretic.  HENT:     Head: Normocephalic and  atraumatic.  Cardiovascular:     Rate and Rhythm: Normal rate.  Pulmonary:     Effort: Pulmonary effort is normal. No respiratory distress.  Abdominal:     Comments: Erythema surrounding the naval area that is indurated with cloudy drainage expressed to palpation. Minimal palpable tenderness to palpation.  See images below for further  Musculoskeletal:        General: Normal range of motion.     Cervical back: Normal range of motion.  Skin:    General: Skin is warm and dry.  Neurological:      General: No focal deficit present.     Mental Status: Kimberly [name] is alert.  Psychiatric:        Mood and Affect: Mood normal.        Behavior: Behavior normal.     ED Results / Procedures / Treatments   Labs (all labs ordered are listed, but only abnormal results are displayed) Labs Reviewed  COMPREHENSIVE METABOLIC PANEL - Abnormal; Notable for the following components:      Result Value   CO2 20 (*)    Calcium 8.5 (*)    Albumin 2.7 (*)    Total Bilirubin 0.2 (*)    All other components within normal limits  CBC WITH DIFFERENTIAL/PLATELET - Abnormal; Notable for the following components:   WBC 12.5 (*)    RBC 3.82 (*)    Hemoglobin 8.5 (*)    HCT 29.9 (*)    MCV 78.3 (*)    MCH 22.3 (*)    MCHC 28.4 (*)    RDW 16.5 (*)    Platelets 506 (*)    Neutro Abs 9.3 (*)    Abs Immature Granulocytes 0.09 (*)    All other components within normal limits  URINALYSIS, ROUTINE W REFLEX MICROSCOPIC - Abnormal; Notable for the following components:   Color, Urine AMBER (*)    APPearance CLOUDY (*)    Bilirubin Urine SMALL (*)    Protein, ur 30 (*)    Leukocytes,Ua SMALL (*)    Bacteria, UA RARE (*)    All other components within normal limits  I-STAT BETA HCG BLOOD, ED ([facility], [facility], [facility] ONLY) - Abnormal; Notable for the following components:   I-stat hCG, quantitative 15.1 (*)    All other components within normal limits  CULTURE, BLOOD (ROUTINE X 2)  CULTURE, BLOOD (ROUTINE X 2)  LACTIC ACID, PLASMA  PREGNANCY, URINE  LACTIC ACID, PLASMA    EKG None  Radiology CT ABDOMEN PELVIS W CONTRAST  Result Date: 04/28/2022 CLINICAL DATA:  Status post left ovarian cystectomy on 04/14/2022 with worsening cellulitis at the incision site EXAM: CT ABDOMEN AND PELVIS WITH CONTRAST TECHNIQUE: Multidetector CT imaging of the abdomen and pelvis was performed using the standard protocol following bolus administration of intravenous contrast. RADIATION DOSE REDUCTION: This exam was performed according to the  departmental dose-optimization program which includes automated exposure control, adjustment of the mA and/or kV according to patient size and/or use of iterative reconstruction technique. CONTRAST:  75mL OMNIPAQUE IOHEXOL 350 MG/ML SOLN COMPARISON:  CT abdomen and pelvis dated 08/10/2019 FINDINGS: Lower chest: No focal consolidation or pulmonary nodule in the lung bases. No pleural effusion or pneumothorax demonstrated. Partially imaged heart size is normal. Hepatobiliary: No focal hepatic lesions. No intra or extrahepatic biliary ductal dilation. Normal gallbladder. Pancreas: No focal lesions or main ductal dilation. Spleen: Unchanged splenomegaly measures 15.6 cm. Adrenals/Urinary Tract: No adrenal nodules. No suspicious renal mass, calculi or hydronephrosis. No focal bladder  wall thickening. Stomach/Bowel: Normal appearance of the stomach. No evidence of bowel wall thickening, distention, or inflammatory changes. Normal appendix. Vascular/Lymphatic: No significant vascular findings are present. No enlarged abdominal or pelvic lymph nodes. Reproductive: No adnexal masses. Other: No free fluid, fluid collection, or free air. Musculoskeletal: No acute or abnormal lytic or blastic osseous lesions. Small fluid collection along the umbilical port site measures 3.9 x 1.8 x 1.7 cm 3:63, 7:89). Subcutaneous soft tissue stranding along the right aspect of the umbilical port site with a small amount of subcutaneous stranding extending into the bilateral flank. IMPRESSION: 1. Small fluid collection along the umbilical port site measures 3.9 x 1.8 x 1.7 cm. 2. Subcutaneous soft tissue stranding along the right aspect of the umbilical port site, likely cellulitis, with a small amount of subcutaneous stranding extending into the bilateral flank. 3. Unchanged splenomegaly. Electronically Signed   By: Agustin CreeLimin  Xu M.D.   On: 04/28/2022 15:01    Procedures Procedures    Medications Ordered in ED Medications  prenatal  multivitamin tablet 1 tablet (has no administration in time range)  lactated ringers infusion (has no administration in time range)  ibuprofen (ADVIL) tablet 600 mg (has no administration in time range)  oxyCODONE (Oxy IR/ROXICODONE) immediate release tablet 5 mg (has no administration in time range)  alum & mag hydroxide-simeth (MAALOX/MYLANTA) 200-200-20 MG/5ML suspension 30 mL (has no administration in time range)  metoCLOPramide (REGLAN) injection 10 mg (has no administration in time range)  ceFAZolin (ANCEF) IVPB 2g/100 mL premix (has no administration in time range)  ketorolac (TORADOL) 15 MG/ML injection 15 mg (15 mg Intravenous Given 04/28/22 1327)  iohexol (OMNIPAQUE) 350 MG/ML injection 75 mL (75 mLs Intravenous Contrast Given 04/28/22 1432)  oxyCODONE (Oxy IR/ROXICODONE) immediate release tablet 5 mg (5 mg Oral Given 04/28/22 1718)    ED Course/ Medical Decision Making/ A&P                             Medical Decision Making Amount and/or Complexity of Data Reviewed Labs: ordered. Radiology: ordered.  Risk Prescription drug management. Decision regarding hospitalization.   This patient is a 32 y.o. female who presents to the ED for concern of post op problem, this involves an extensive number of treatment options, and is a complaint that carries with it a high risk of complications and morbidity. The emergent differential diagnosis prior to evaluation includes, but is not limited to,  sepsis, seroma, abscess, bowel perforation, bowel obstruction . This is not an exhaustive differential.   Past Medical History / Co-morbidities / Social History: history of PCOS with uterine d&c and laparoscopic ovarian cystectomy and lysis of adhesions performed by Dr. Alysia PennaErvin on 04/14/22  Additional history: Chart reviewed. Pertinent results include: see for post op appointment outpatient, started on bactrim with concerns for cellulitis.  Physical Exam: Physical exam performed. The pertinent  findings include: findings per above, erythema with induration and cloudy drainage from the naval incision site  Lab Tests: I ordered, and personally interpreted labs.  The pertinent results include:  WBC 12.5, hgb 8.5 down from 10.8 2 weeks ago before surgery. Lactic WNL   Imaging Studies: I ordered imaging studies including CT abdomen pelvis. I independently visualized and interpreted imaging which showed   1. Small fluid collection along the umbilical port site measures 3.9 x 1.8 x 1.7 cm. 2. Subcutaneous soft tissue stranding along the right aspect of the umbilical port site, likely cellulitis, with a  small amount of subcutaneous stranding extending into the bilateral flank. 3. Unchanged splenomegaly.   I agree with the radiologist interpretation.  Medications: I ordered medication including toradol  for pain. Reevaluation of the patient after these medicines showed that the patient improved. I have reviewed the patients home medicines and have made adjustments as needed.  Consultations Obtained: I requested consultation with the Ob/gyn on call Dr. Donavan Foil,  and discussed lab and imaging findings as well as pertinent plan - they recommend: they will admit the patient   Disposition:  Patient presents today with drainage from her incision site. CT shows fluid collection. Kimberly [name] endorses fevers up to 100 at home but is afebrile today. OB/GYN consulted who opened the fluid collection and packed the area and will admit the patient for IV antibiotics. Patient is understanding and amenable with plan.   Final Clinical Impression(s) / ED Diagnoses Final diagnoses:  Cellulitis of drainage site, post-operative    Rx / DC Orders ED Discharge Orders     None         Tomesha, Sargent 04/28/22 1812    Rondel Baton, MD 05/01/22 [phone]

## 2022-04-28 NOTE — ED Triage Notes (Signed)
Pt arrives pov coming from home. Pt states she had ovarian surgery on march 26th. She noticed redness and hardness to incision sites on 4/4, was diagnosed with cellulitis and is currently on antibiotics but feels that infection is worse. Denies fevers.

## 2022-04-28 NOTE — ED Notes (Signed)
ED TO INPATIENT HANDOFF REPORT  ED Nurse Name and Phone #: Collene Mares Name/Age/Gender Kimberly [name] 32 y.o. female Room/Bed: 005C/005C  Code Status   Code Status: Full Code  Home/SNF/Other Home Patient oriented to: self, place, time, and situation Is this baseline? Yes   Triage Complete: Triage complete  Chief Complaint Postoperative cellulitis of surgical wound [T81.49XA]  Triage Note Pt arrives pov coming from home. Pt states she had ovarian surgery on march 26th. She noticed redness and hardness to incision sites on 4/4, was diagnosed with cellulitis and is currently on antibiotics but feels that infection is worse. Denies fevers.    Allergies Allergies  Allergen Reactions   Cold & Cough Daytime [Acetaminophen-Dm] Shortness Of Breath    Could not tolerate- orange liquid, may have contained Pseudoephedrine- "I could not tolerate"   Zofran [Ondansetron] Other (See Comments)    Muscle Spasms   Abilify [Aripiprazole] Other (See Comments)    Lethargy, unable to function   Amoxicillin-Pot Clavulanate Other (See Comments)    Stomach pains Has patient had a PCN reaction causing immediate rash, facial/tongue/throat swelling, SOB or lightheadedness with hypotension: No Has patient had a PCN reaction causing severe rash involving mucus membranes or skin necrosis: No Has patient had a PCN reaction that required hospitalization No Has patient had a PCN reaction occurring within the last 10 years: Yes If all of the above answers are "NO", then may proceed with Cephalosporin use.    Cephalexin Other (See Comments)    Stomach pains Pt taking Cefadroxil as outpatient   Latex Swelling and Other (See Comments)    Burning of skin, too   Phenergan [Promethazine]     'gave me twitches' 'not able to sit still'   Tape Swelling    Swelling, burning of skin .    Zicam Cold Remedy [Homeopathic Products] Nausea And Vomiting   Keflex [Cephalexin] Rash and Other (See Comments)     Rash--able to take w/ Benadryl   Vicodin [Hydrocodone-Acetaminophen] Hives and Rash    Per pt- makes her very sleepy, feels like she isn't getting her breath. States she does not have throat/tongue swelling or anaphylaxis.    Level of Care/Admitting Diagnosis ED Disposition     ED Disposition  Admit   Condition  --   Comment  Hospital Area: [facility] [[id number]]  Level of Care: Med-Surg [16]  May admit patient to [facility] or [facility] if equivalent level of care is available:: Yes  Covid Evaluation: Asymptomatic - no recent exposure (last 10 days) testing not required  Diagnosis: Postoperative cellulitis of surgical wound [[id number]]  Admitting Physician: Warden Fillers [[id number]]  Attending Physician: Warden Fillers [phone]  Certification:: I certify this patient will need inpatient services for at least 2 midnights  Estimated Length of Stay: 3          B Medical/Surgery History 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    Iron deficiency anemia    Migraines    "weekly" (11/16/2012)  Oligomenorrhea 03/01/2011   Ovarian cyst    PONV (postoperative nausea and vomiting)    Pregnancy induced hypertension    Schizophrenia    "moderate" (11/16/2012)   Scoliosis    Past Surgical History:  Procedure Laterality Date   CESAREAN SECTION N/A 08/02/2019   Procedure: CESAREAN SECTION;  Surgeon: Kathrynn Running, MD;  Location: [facility] LD ORS;  Service: Obstetrics;  Laterality: N/A;   HYSTEROSCOPY WITH D & C N/A 04/14/2022   Procedure: DILATATION AND CURETTAGE;   Surgeon: Hermina Staggers, MD;  Location: [facility] OR;  Service: Gynecology;  Laterality: N/A;   LAPAROSCOPIC LYSIS OF ADHESIONS N/A 04/14/2022   Procedure: LAPAROSCOPIC LYSIS OF ADHESIONS;  Surgeon: Hermina Staggers, MD;  Location: [facility] OR;  Service: Gynecology;  Laterality: N/A;   LAPAROSCOPIC OVARIAN CYSTECTOMY Left 02/11/2016   Procedure: LAPAROSCOPIC OVARIAN CYSTECTOMY;  Surgeon: Allie Bossier, MD;  Location: [facility] ORS;  Service: Gynecology;  Laterality: Left;   LAPAROSCOPIC OVARIAN CYSTECTOMY Left 04/14/2022   Procedure: LAPAROSCOPIC OVARIAN CYSTECTOMY;  Surgeon: Hermina Staggers, MD;  Location: [facility] OR;  Service: Gynecology;  Laterality: Left;   LAPAROSCOPY N/A 04/14/2022   Procedure: LAPAROSCOPY DIAGNOSTIC;  Surgeon: Hermina Staggers, MD;  [facility];  Service: Gynecology;  Laterality: N/A;   UPPER GI ENDOSCOPY  ~ 2006; ~2008     A IV Location/Drains/Wounds Patient Lines/Drains/Airways Status     Active Line/Drains/Airways     Name Placement date Placement time Site Days   Peripheral IV 04/28/22 20 G Left Antecubital 04/28/22  1238  Antecubital  less than 1   Incision - 1 Port Abdomen 1: Mid;Lower 04/14/22  1100  -- 14   Incision - 3 Ports Abdomen 1: Mid 2: Right 3: Left 04/14/22  1119  -- 14            Intake/Output Last 24 hours No intake or output data in the 24 hours ending 04/28/22 1758  Labs/Imaging Results for orders placed or performed during the hospital encounter of 04/28/22 (from the past 48 hour(s))  Comprehensive metabolic panel     Status: Abnormal   Collection Time: 04/28/22 11:06 AM  Result Value Ref Range   Sodium 136 135 - 145 mmol/L   Potassium 3.6 3.5 - 5.1 mmol/L   Chloride 105 98 - 111 mmol/L   CO2 20 (L) 22 - 32 mmol/L   Glucose, Bld 97 70 - 99 mg/dL    Comment: Glucose reference range applies only to samples taken after fasting for at least 8 hours.   BUN 9 6 - 20 mg/dL   Creatinine, Ser 8.11 0.44 - 1.00 mg/dL   Calcium 8.5 (L) 8.9 - 10.3 mg/dL   Total  Protein 7.3 6.5 - 8.1 g/dL   Albumin 2.7 (L) 3.5 - 5.0 g/dL   AST 20 15 - 41 U/L   ALT 21 0 - 44 U/L   Alkaline Phosphatase 111 38 - 126 U/L   Total Bilirubin 0.2 (L) 0.3 - 1.2 mg/dL   GFR, Estimated >91 >47 mL/min    Comment: (NOTE) Calculated using the CKD-EPI Creatinine Equation (2021)    Anion gap 11 5 - 15    Comment: Performed at [facility], [address], [place], [place] [address]  CBC with Diff     Status: Abnormal   Collection Time: 04/28/22 11:06 AM  Result Value Ref Range   WBC 12.5 (H) 4.0 - 10.5 K/uL   RBC 3.82 (L) 3.87 - 5.11 MIL/uL  Hemoglobin 8.5 (L) 12.0 - 15.0 g/dL   HCT 44.9 (L) 20.1 - 00.7 %   MCV 78.3 (L) 80.0 - 100.0 fL   MCH 22.3 (L) 26.0 - 34.0 pg   MCHC 28.4 (L) 30.0 - 36.0 g/dL   RDW 12.1 (H) 97.5 - 88.3 %   Platelets 506 (H) 150 - 400 K/uL   nRBC 0.0 0.0 - 0.2 %   Neutrophils Relative % 74 %   Neutro Abs 9.3 (H) 1.7 - 7.7 K/uL   Lymphocytes Relative 16 %   Lymphs Abs 2.0 0.7 - 4.0 K/uL   Monocytes Relative 7 %   Monocytes Absolute 0.9 0.1 - 1.0 K/uL   Eosinophils Relative 2 %   Eosinophils Absolute 0.2 0.0 - 0.5 K/uL   Basophils Relative 0 %   Basophils Absolute 0.0 0.0 - 0.1 K/uL   Immature Granulocytes 1 %   Abs Immature Granulocytes 0.09 (H) 0.00 - 0.07 K/uL    Comment: Performed at [facility], [address] [place], [place] [address]  Lactic acid, plasma     Status: None   Collection Time: 04/28/22 11:06 AM  Result Value Ref Range   Lactic Acid, Venous 1.3 0.5 - 1.9 mmol/L    Comment: Performed at [facility], [address] [place], [place] [address]  I-Stat beta hCG blood, ED     Status: Abnormal   Collection Time: 04/28/22 11:14 AM  Result Value Ref Range   I-stat hCG, quantitative 15.1 (H) <5 mIU/mL   Comment 3            Comment:   GEST. AGE      CONC.  (mIU/mL)   <=1 WEEK        5 - 50     2 WEEKS       50 - 500     3 WEEKS       100 - 10,000     4 WEEKS     1,000 - 30,000        FEMALE AND  NON-PREGNANT FEMALE:     LESS THAN 5 mIU/mL   Urinalysis, Routine w reflex microscopic -Urine, Clean Catch     Status: Abnormal   Collection Time: 04/28/22 11:45 AM  Result Value Ref Range   Color, Urine AMBER (A) YELLOW    Comment: BIOCHEMICALS MAY BE AFFECTED BY COLOR   APPearance CLOUDY (A) CLEAR   Specific Gravity, Urine 1.027 1.005 - 1.030   pH 5.0 5.0 - 8.0   Glucose, UA NEGATIVE NEGATIVE mg/dL   Hgb urine dipstick NEGATIVE NEGATIVE   Bilirubin Urine SMALL (A) NEGATIVE   Ketones, ur NEGATIVE NEGATIVE mg/dL   Protein, ur 30 (A) NEGATIVE mg/dL   Nitrite NEGATIVE NEGATIVE   Leukocytes,Ua SMALL (A) NEGATIVE   RBC / HPF 0-5 0 - 5 RBC/hpf   WBC, UA 0-5 0 - 5 WBC/hpf   Bacteria, UA RARE (A) NONE SEEN   Squamous Epithelial / HPF 6-10 0 - 5 /HPF   Mucus PRESENT    Budding Yeast PRESENT    Uric Acid Crys, UA PRESENT     Comment: Performed at [facility], [address] [place], [place] [address]  Pregnancy, urine     Status: None   Collection Time: 04/28/22 11:45 AM  Result Value Ref Range   Preg Test, Ur NEGATIVE NEGATIVE    Comment:        THE SENSITIVITY OF THIS METHODOLOGY IS >  20 mIU/mL. Performed at [facility], [address] [place], [place] [id number]    CT ABDOMEN PELVIS W CONTRAST  Result Date: 04/28/2022 CLINICAL DATA:  Status post left ovarian cystectomy on 04/14/2022 with worsening cellulitis at the incision site EXAM: CT ABDOMEN AND PELVIS WITH CONTRAST TECHNIQUE: Multidetector CT imaging of the abdomen and pelvis was performed using the standard protocol following bolus administration of intravenous contrast. RADIATION DOSE REDUCTION: This exam was performed according to the departmental dose-optimization program which includes automated exposure control, adjustment of the mA and/or kV according to patient size and/or use of iterative reconstruction technique. CONTRAST:  75mL OMNIPAQUE IOHEXOL 350 MG/ML SOLN COMPARISON:  CT abdomen and pelvis dated  08/10/2019 FINDINGS: Lower chest: No focal consolidation or pulmonary nodule in the lung bases. No pleural effusion or pneumothorax demonstrated. Partially imaged heart size is normal. Hepatobiliary: No focal hepatic lesions. No intra or extrahepatic biliary ductal dilation. Normal gallbladder. Pancreas: No focal lesions or main ductal dilation. Spleen: Unchanged splenomegaly measures 15.6 cm. Adrenals/Urinary Tract: No adrenal nodules. No suspicious renal mass, calculi or hydronephrosis. No focal bladder wall thickening. Stomach/Bowel: Normal appearance of the stomach. No evidence of bowel wall thickening, distention, or inflammatory changes. Normal appendix. Vascular/Lymphatic: No significant vascular findings are present. No enlarged abdominal or pelvic lymph nodes. Reproductive: No adnexal masses. Other: No free fluid, fluid collection, or free air. Musculoskeletal: No acute or abnormal lytic or blastic osseous lesions. Small fluid collection along the umbilical port site measures 3.9 x 1.8 x 1.7 cm 3:63, 7:89). Subcutaneous soft tissue stranding along the right aspect of the umbilical port site with a small amount of subcutaneous stranding extending into the bilateral flank. IMPRESSION: 1. Small fluid collection along the umbilical port site measures 3.9 x 1.8 x 1.7 cm. 2. Subcutaneous soft tissue stranding along the right aspect of the umbilical port site, likely cellulitis, with a small amount of subcutaneous stranding extending into the bilateral flank. 3. Unchanged splenomegaly. Electronically Signed   By: Agustin CreeLimin  Xu M.D.   On: 04/28/2022 15:01    Pending Labs Unresulted Labs (From admission, onward)     Start     Ordered   04/29/22 0500  CBC  Tomorrow morning,   R        04/28/22 1732   04/28/22 1741  Aerobic Culture w Gram Stain (superficial specimen)  Once,   R       Question:  Patient immune status  Answer:  Normal   04/28/22 1740   04/28/22 1043  Blood culture (routine x 2)  BLOOD CULTURE X  2,   R     Question:  Release to patient  Answer:  Immediate   04/28/22 1042            Vitals/Pain Today's Vitals   04/28/22 1034 04/28/22 1230 04/28/22 1300 04/28/22 1644  BP:  108/65 117/79 (!) 98/58  Pulse: 93 83 88 84  Resp: 14 18 18 18   Temp:    97.9 F (36.6 C)  SpO2: 99% 99% 100% 100%  PainSc:        Isolation Precautions No active isolations  Medications Medications  prenatal multivitamin tablet 1 tablet (has no administration in time range)  lactated ringers infusion (has no administration in time range)  ibuprofen (ADVIL) tablet 600 mg (has no administration in time range)  oxyCODONE (Oxy IR/ROXICODONE) immediate release tablet 5 mg (has no administration in time range)  alum & mag hydroxide-simeth (MAALOX/MYLANTA) 200-200-20 MG/5ML suspension 30 mL (  has no administration in time range)  metoCLOPramide (REGLAN) injection 10 mg (has no administration in time range)  ceFAZolin (ANCEF) IVPB 2g/100 mL premix (has no administration in time range)  ketorolac (TORADOL) 15 MG/ML injection 15 mg (15 mg Intravenous Given 04/28/22 1327)  iohexol (OMNIPAQUE) 350 MG/ML injection 75 mL (75 mLs Intravenous Contrast Given 04/28/22 1432)  oxyCODONE (Oxy IR/ROXICODONE) immediate release tablet 5 mg (5 mg Oral Given 04/28/22 1718)    Mobility walks     Focused Assessments    R Recommendations: See Admitting Provider Note  Report given to:   Additional Notes:

## 2022-04-28 NOTE — Telephone Encounter (Signed)
Open in error

## 2022-04-28 NOTE — H&P (Signed)
[facility] Obst[facility]s and Gynecology Attending History and Physical  Kimberly [name] is a 32 y.o. [id number] seen in the Emergency Department regarding a possible postoperative cellulitis from gyn procedure on 04/14/22.  Pt had laparoscopic ovarian cystectomy and dilation/curettage.   Pt was seen on 04/23/22 for a wound check. Wound at that time was clean dry and intact, but there was some apparent cellulitis.  Pt was started on po bactrim.  Pt noted increasing pain over the weekend and also had drainage today in the ER.  Redness and hardarea around the wound still persists.  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    Iron deficiency anemia    Migraines    "weekly" (11/16/2012)   Oligomenorrhea 03/01/2011   Ovarian cyst    PONV (postoperative nausea and vomiting)    Pregnancy induced hypertension    Schizophrenia    "moderate" (11/16/2012)   Scoliosis    Past Surgical History:  Procedure Laterality Date   CESAREAN SECTION N/A 08/02/2019   Procedure: CESAREAN SECTION;  Surgeon: Kathrynn Running, MD;  Location: [facility]S;  Service: Obstetrics;  Laterality: N/A;   HYSTEROSCOPY WITH D & C N/A 04/14/2022   Procedure: DILATATION AND CURETTAGE;  Surgeon: Hermina Staggers, MD;  Location: [facility] OR;  Service: Gynecology;  Laterality: N/A;   LAPAROSCOPIC LYSIS OF ADHESIONS N/A 04/14/2022   Procedure: LAPAROSCOPIC LYSIS OF ADHESIONS;  Surgeon: Hermina Staggers, MD;  Location: [facility] OR;  Service: Gynecology;  Laterality: N/A;   LAPAROSCOPIC OVARIAN CYSTECTOMY Left 02/11/2016   Procedure: LAPAROSCOPIC OVARIAN  CYSTECTOMY;  Surgeon: Allie Bossier, MD;  Location: [facility] ORS;  Service: Gynecology;  Laterality: Left;   LAPAROSCOPIC OVARIAN CYSTECTOMY Left 04/14/2022   Procedure: LAPAROSCOPIC OVARIAN CYSTECTOMY;  Surgeon: Hermina Staggers, MD;  Location: [facility] OR;  Service: Gynecology;  Laterality: Left;   LAPAROSCOPY N/A 04/14/2022   Procedure: LAPAROSCOPY DIAGNOSTIC;  Surgeon: Hermina Staggers, MD;  Location: [facility] OR;  Service: Gynecology;  Laterality: N/A;   UPPER GI ENDOSCOPY  ~ 2006; ~2008   OB History  Gravida Para Term Preterm AB Living  3 2 2   1 2   SAB IAB Ectopic Multiple Live Births  1     0 2    # Outcome Date GA Lbr Len/2nd Weight Sex Delivery Anes PTL Lv  3 Term 08/02/19 [redacted]w[redacted]d  3650 g M CS-LTranv EPI  LIV  2 Term 07/31/15 [redacted]w[redacted]d 10:24 / 00:54 3685 g M Vag-Spont EPI  LIV  1 SAB 07/2014 [redacted]w[redacted]d         Patient denies any other pertinent gynecologic issues.  No current facility-administered medications on file prior to encounter.   Current Outpatient Medications on File Prior to Encounter  Medication Sig Dispense Refill   busPIRone (BUSPAR) 10 MG tablet Take 1 tablet (10 mg total) by mouth 2 (two) times daily. 60 tablet 3   FLUoxetine (PROZAC) 20 MG capsule TAKE 1 CAPSULE(20 MG) BY MOUTH DAILY 90 capsule 1   HYDROmorphone (DILAUDID) 2 MG tablet Take 0.5  tablets (1 mg total) by mouth every 6 (six) hours as needed for severe pain. 24 tablet 0   ibuprofen (ADVIL) 800 MG tablet Take 1 tablet (800 mg total) by mouth every 8 (eight) hours as needed. 30 tablet 0   levothyroxine (SYNTHROID) 100 MCG tablet Take 1 tablet (100 mcg total) by mouth daily. 30 tablet 1   meclizine (ANTIVERT) 25 MG tablet TAKE 1 TABLET(25 MG) BY MOUTH THREE TIMES DAILY AS NEEDED FOR DIZZINESS 90 tablet 11   omeprazole (PRILOSEC) 20 MG capsule TAKE 1 CAPSULE(20 MG) BY MOUTH DAILY 90 capsule 1   propranolol ER (INDERAL LA) 60 MG 24 hr capsule Take 1 capsule (60 mg total) by mouth daily. 90 capsule 1   sulfamethoxazole-trimethoprim  (BACTRIM DS) 800-160 MG tablet Take 1 tablet by mouth 2 (two) times daily. 14 tablet 0   Allergies  Allergen Reactions   Cold & Cough Daytime [Acetaminophen-Dm] Shortness Of Breath    Could not tolerate- orange liquid, may have contained Pseudoephedrine- "I could not tolerate"   Zofran [Ondansetron] Other (See Comments)    Muscle Spasms   Abilify [Aripiprazole] Other (See Comments)    Lethargy, unable to function   Amoxicillin-Pot Clavulanate Other (See Comments)    Stomach pains Has patient had a PCN reaction causing immediate rash, facial/tongue/throat swelling, SOB or lightheadedness with hypotension: No Has patient had a PCN reaction causing severe rash involving mucus membranes or skin necrosis: No Has patient had a PCN reaction that required hospitalization No Has patient had a PCN reaction occurring within the last 10 years: Yes If all of the above answers are "NO", then may proceed with Cephalosporin use.    Cephalexin Other (See Comments)    Stomach pains Pt taking Cefadroxil as outpatient   Latex Swelling and Other (See Comments)    Burning of skin, too   Phenergan [Promethazine]     'gave me twitches' 'not able to sit still'   Tape Swelling    Swelling, burning of skin .    Zicam Cold Remedy [Homeopathic Products] Nausea And Vomiting   Keflex [Cephalexin] Rash and Other (See Comments)    Rash--able to take w/ Benadryl   Vicodin [Hydrocodone-Acetaminophen] Hives and Rash    Per pt- makes her very sleepy, feels like she isn't getting her breath. States she does not have throat/tongue swelling or anaphylaxis.    Social History:   reports that she has never smoked. She has never used smokeless tobacco. She reports that she does not drink alcohol and does not use drugs. Family History  Problem Relation Age of Onset   Diabetes Mother    Hypertension Mother    Vision loss Mother    Hypertension Father    Hyperlipidemia Father    Thyroid disease Maternal Grandmother      Review of Systems: Pertinent items noted in HPI and remainder of comprehensive ROS otherwise negative.  PHYSICAL EXAM: Blood pressure (!) 98/58, pulse 84, temperature 97.9 F (36.6 C), resp. rate 18, last menstrual period 04/09/2022, SpO2 100 %. CONSTITUTIONAL: Well-developed, obese, well-nourished female in no acute distress.  HENT:  Normocephalic, atraumatic, External right and left ear normal. Oropharynx is clear and moist EYES: Conjunctivae and EOM are normal.   NECK: Normal range of motion, supple, no masses SKIN: Skin is warm and dry. No rash noted. Not diaphoretic. No erythema. No pallor. NEUROLOGIC: Alert and oriented to person, place, and time. Normal reflexes, muscle tone coordination. No cranial nerve deficit noted. PSYCHIATRIC: Normal mood and  affect. Normal behavior. Normal judgment and thought content. CARDIOVASCULAR: Normal heart rate noted, regular rhythm RESPIRATORY: Effort and breath sounds normal, no problems with respiration noted ABDOMEN: Soft, tender at umbilicus, nondistended. PELVIC: deferred MUSCULOSKELETAL: Normal range of motion. No tenderness.  No cyanosis, clubbing, or edema.  2+ distal pulses.  Wound:  grayish green watery discharge noted at the umbilicus.  Umbilical incision intact except at the extreme lower portion of internal fold.  Defect noted with a tunneling pocket to the patient's left side.  Wound culture taken.  Wound packed with gauze as a wet todry.  Labs: Results for orders placed or performed during the hospital encounter of 04/28/22 (from the past 336 hour(s))  Comprehensive metabolic panel   Collection Time: 04/28/22 11:06 AM  Result Value Ref Range   Sodium 136 135 - 145 mmol/L   Potassium 3.6 3.5 - 5.1 mmol/L   Chloride 105 98 - 111 mmol/L   CO2 20 (L) 22 - 32 mmol/L   Glucose, Bld 97 70 - 99 mg/dL   BUN 9 6 - 20 mg/dL   Creatinine, Ser 1.61 0.44 - 1.00 mg/dL   Calcium 8.5 (L) 8.9 - 10.3 mg/dL   Total Protein 7.3 6.5 - 8.1 g/dL    Albumin 2.7 (L) 3.5 - 5.0 g/dL   AST 20 15 - 41 U/L   ALT 21 0 - 44 U/L   Alkaline Phosphatase 111 38 - 126 U/L   Total Bilirubin 0.2 (L) 0.3 - 1.2 mg/dL   GFR, Estimated >09 >60 mL/min   Anion gap 11 5 - 15  CBC with Diff   Collection Time: 04/28/22 11:06 AM  Result Value Ref Range   WBC 12.5 (H) 4.0 - 10.5 K/uL   RBC 3.82 (L) 3.87 - 5.11 MIL/uL   Hemoglobin 8.5 (L) 12.0 - 15.0 g/dL   HCT 45.4 (L) 09.8 - 11.9 %   MCV 78.3 (L) 80.0 - 100.0 fL   MCH 22.3 (L) 26.0 - 34.0 pg   MCHC 28.4 (L) 30.0 - 36.0 g/dL   RDW 14.7 (H) 82.9 - 56.2 %   Platelets 506 (H) 150 - 400 K/uL   nRBC 0.0 0.0 - 0.2 %   Neutrophils Relative % 74 %   Neutro Abs 9.3 (H) 1.7 - 7.7 K/uL   Lymphocytes Relative 16 %   Lymphs Abs 2.0 0.7 - 4.0 K/uL   Monocytes Relative 7 %   Monocytes Absolute 0.9 0.1 - 1.0 K/uL   Eosinophils Relative 2 %   Eosinophils Absolute 0.2 0.0 - 0.5 K/uL   Basophils Relative 0 %   Basophils Absolute 0.0 0.0 - 0.1 K/uL   Immature Granulocytes 1 %   Abs Immature Granulocytes 0.09 (H) 0.00 - 0.07 K/uL  Lactic acid, plasma   Collection Time: 04/28/22 11:06 AM  Result Value Ref Range   Lactic Acid, Venous 1.3 0.5 - 1.9 mmol/L  I-Stat beta hCG blood, ED   Collection Time: 04/28/22 11:14 AM  Result Value Ref Range   I-stat hCG, quantitative 15.1 (H) <5 mIU/mL   Comment 3          Urinalysis, Routine w reflex microscopic -Urine, Clean Catch   Collection Time: 04/28/22 11:45 AM  Result Value Ref Range   Color, Urine AMBER (A) YELLOW   APPearance CLOUDY (A) CLEAR   Specific Gravity, Urine 1.027 1.005 - 1.030   pH 5.0 5.0 - 8.0   Glucose, UA NEGATIVE NEGATIVE mg/dL   Hgb urine dipstick NEGATIVE NEGATIVE  Bilirubin Urine SMALL (A) NEGATIVE   Ketones, ur NEGATIVE NEGATIVE mg/dL   Protein, ur 30 (A) NEGATIVE mg/dL   Nitrite NEGATIVE NEGATIVE   Leukocytes,Ua SMALL (A) NEGATIVE   RBC / HPF 0-5 0 - 5 RBC/hpf   WBC, UA 0-5 0 - 5 WBC/hpf   Bacteria, UA RARE (A) NONE SEEN   Squamous  Epithelial / HPF 6-10 0 - 5 /HPF   Mucus PRESENT    Budding Yeast PRESENT    Uric Acid Crys, UA PRESENT   Pregnancy, urine   Collection Time: 04/28/22 11:45 AM  Result Value Ref Range   Preg Test, Ur NEGATIVE NEGATIVE    Imaging Studies: CT ABDOMEN PELVIS W CONTRAST  Result Date: 04/28/2022 CLINICAL DATA:  Status post left ovarian cystectomy on 04/14/2022 with worsening cellulitis at the incision site EXAM: CT ABDOMEN AND PELVIS WITH CONTRAST TECHNIQUE: Multidetector CT imaging of the abdomen and pelvis was performed using the standard protocol following bolus administration of intravenous contrast. RADIATION DOSE REDUCTION: This exam was performed according to the departmental dose-optimization program which includes automated exposure control, adjustment of the mA and/or kV according to patient size and/or use of iterative reconstruction technique. CONTRAST:  26mL OMNIPAQUE IOHEXOL 350 MG/ML SOLN COMPARISON:  CT abdomen and pelvis dated 08/10/2019 FINDINGS: Lower chest: No focal consolidation or pulmonary nodule in the lung bases. No pleural effusion or pneumothorax demonstrated. Partially imaged heart size is normal. Hepatobiliary: No focal hepatic lesions. No intra or extrahepatic biliary ductal dilation. Normal gallbladder. Pancreas: No focal lesions or main ductal dilation. Spleen: Unchanged splenomegaly measures 15.6 cm. Adrenals/Urinary Tract: No adrenal nodules. No suspicious renal mass, calculi or hydronephrosis. No focal bladder wall thickening. Stomach/Bowel: Normal appearance of the stomach. No evidence of bowel wall thickening, distention, or inflammatory changes. Normal appendix. Vascular/Lymphatic: No significant vascular findings are present. No enlarged abdominal or pelvic lymph nodes. Reproductive: No adnexal masses. Other: No free fluid, fluid collection, or free air. Musculoskeletal: No acute or abnormal lytic or blastic osseous lesions. Small fluid collection along the umbilical  port site measures 3.9 x 1.8 x 1.7 cm 3:63, 7:89). Subcutaneous soft tissue stranding along the right aspect of the umbilical port site with a small amount of subcutaneous stranding extending into the bilateral flank. IMPRESSION: 1. Small fluid collection along the umbilical port site measures 3.9 x 1.8 x 1.7 cm. 2. Subcutaneous soft tissue stranding along the right aspect of the umbilical port site, likely cellulitis, with a small amount of subcutaneous stranding extending into the bilateral flank. 3. Unchanged splenomegaly. Electronically Signed   By: Agustin Cree M.D.   On: 04/28/2022 15:01    Assessment: Principal Problem:   Postoperative cellulitis of surgical wound   Plan: Admit to Women's Unit. Med/surg Daily wet to dry wound packing IV antibiotics to treat cellulitis. Will follow wound culture to see if abx need to be modified.   Mariel Aloe, MD, FACOG Obstetrician & Gynecologist, [facility] for [facility], [facility]

## 2022-04-29 LAB — CBC
HCT: 26.9 % — ABNORMAL LOW (ref 36.0–46.0)
Hemoglobin: 8.1 g/dL — ABNORMAL LOW (ref 12.0–15.0)
MCH: 22.7 pg — ABNORMAL LOW (ref 26.0–34.0)
MCHC: 30.1 g/dL (ref 30.0–36.0)
MCV: 75.4 fL — ABNORMAL LOW (ref 80.0–100.0)
Platelets: 456 10*3/uL — ABNORMAL HIGH (ref 150–400)
RBC: 3.57 MIL/uL — ABNORMAL LOW (ref 3.87–5.11)
RDW: 16.1 % — ABNORMAL HIGH (ref 11.5–15.5)
WBC: 11.9 10*3/uL — ABNORMAL HIGH (ref 4.0–10.5)
nRBC: 0 % (ref 0.0–0.2)

## 2022-04-29 LAB — AEROBIC CULTURE W GRAM STAIN (SUPERFICIAL SPECIMEN): Special Requests: NORMAL

## 2022-04-29 MED ORDER — PANTOPRAZOLE SODIUM 40 MG PO TBEC
40.0000 mg | DELAYED_RELEASE_TABLET | Freq: Every day | ORAL | Status: DC
  Administered 2022-04-29: 40 mg via ORAL
  Filled 2022-04-29: qty 1

## 2022-04-29 MED ORDER — FLUOXETINE HCL 20 MG PO CAPS
20.0000 mg | ORAL_CAPSULE | Freq: Every day | ORAL | Status: DC
  Administered 2022-04-29: 20 mg via ORAL
  Filled 2022-04-29: qty 1

## 2022-04-29 MED ORDER — HYDROMORPHONE HCL 2 MG PO TABS
1.0000 mg | ORAL_TABLET | ORAL | Status: DC | PRN
  Administered 2022-04-29: 1 mg via ORAL
  Filled 2022-04-29: qty 1

## 2022-04-29 MED ORDER — HYDROMORPHONE HCL 2 MG PO TABS: 1.0000 mg | ORAL_TABLET | ORAL | 0 refills | Status: DC | PRN

## 2022-04-29 MED ORDER — PROPRANOLOL HCL ER 60 MG PO CP24
60.0000 mg | ORAL_CAPSULE | Freq: Every day | ORAL | Status: DC
  Administered 2022-04-29: 60 mg via ORAL
  Filled 2022-04-29: qty 1

## 2022-04-29 MED ORDER — IBUPROFEN 800 MG PO TABS: 800.0000 mg | ORAL_TABLET | Freq: Three times a day (TID) | ORAL | 0 refills | Status: DC

## 2022-04-29 MED ORDER — MECLIZINE HCL 25 MG PO TABS
25.0000 mg | ORAL_TABLET | Freq: Three times a day (TID) | ORAL | Status: DC | PRN
  Administered 2022-04-29: 25 mg via ORAL
  Filled 2022-04-29 (×2): qty 1

## 2022-04-29 MED ORDER — LEVOTHYROXINE SODIUM 50 MCG PO TABS
100.0000 ug | ORAL_TABLET | Freq: Every day | ORAL | Status: DC
  Administered 2022-04-29: 100 ug via ORAL
  Filled 2022-04-29 (×2): qty 2

## 2022-04-29 MED ORDER — CEPHALEXIN 500 MG PO CAPS
500.0000 mg | ORAL_CAPSULE | Freq: Three times a day (TID) | ORAL | 0 refills | Status: DC
Start: 2022-04-29 — End: 2022-10-28

## 2022-04-29 NOTE — Discharge Instructions (Signed)
Remove and repack wound daily. Once gauze has been removed, clean base of belly button with hydrogen peroxide.  Dry area.  Gently pack bottom of open wound with iodoform gauze and leave a tail for easy removal.

## 2022-04-29 NOTE — Progress Notes (Signed)
Progress Note:  Subjective: Interval History: Pt did well overnight and tolerated IV abx without incident.  She subjectively feels better and states the umbilicus is less painful.  She denies fever/chills.  Wound was repacked and the patient's spouse was shown how to perform the procedure.  Objective: Vital signs in last 24 hours: Temp:  [97.9 F (36.6 C)-99.4 F (37.4 C)] 98.1 F (36.7 C) (04/10 0834) Pulse Rate:  [78-91] 78 (04/10 0331) Resp:  [17-18] 17 (04/10 0834) BP: (98-120)/(57-79) 111/65 (04/10 0834) SpO2:  [97 %-100 %] 99 % (04/10 0834) Weight change:   Intake/Output from previous day: 04/09 0701 - 04/10 0700 In: 871.8 [I.V.:671.8; IV Piggyback:200] Out: -  Intake/Output this shift: No intake/output data recorded.  General appearance: alert, cooperative, no distress, and moderately obese Resp: clear to auscultation bilaterally Cardio: regular rate and rhythm Extremities: Homans sign is negative, no sign of DVT Incision/Wound: Iodoform gauze removed without incident Some purulent drainage still noted, but appears less than the previous day. Wound repacked with iodoform tape and gauze pad placed over umbilicus to address any drainage. Infraumbilical area still indurated with erythema, erythema is improved however and less tender per patient  Lab Results: Recent Labs    04/28/22 1106 04/29/22 0306  WBC 12.5* 11.9*  HGB 8.5* 8.1*  HCT 29.9* 26.9*  PLT 506* 456*   BMET:  Recent Labs    04/28/22 1106  NA 136  K 3.6  CL 105  CO2 20*  GLUCOSE 97  BUN 9  CREATININE 0.57  CALCIUM 8.5*    Studies/Results: CT ABDOMEN PELVIS W CONTRAST  Result Date: 04/28/2022 CLINICAL DATA:  Status post left ovarian cystectomy on 04/14/2022 with worsening cellulitis at the incision site EXAM: CT ABDOMEN AND PELVIS WITH CONTRAST TECHNIQUE: Multidetector CT imaging of the abdomen and pelvis was performed using the standard protocol following bolus administration of intravenous  contrast. RADIATION DOSE REDUCTION: This exam was performed according to the departmental dose-optimization program which includes automated exposure control, adjustment of the mA and/or kV according to patient size and/or use of iterative reconstruction technique. CONTRAST:  30mL OMNIPAQUE IOHEXOL 350 MG/ML SOLN COMPARISON:  CT abdomen and pelvis dated 08/10/2019 FINDINGS: Lower chest: No focal consolidation or pulmonary nodule in the lung bases. No pleural effusion or pneumothorax demonstrated. Partially imaged heart size is normal. Hepatobiliary: No focal hepatic lesions. No intra or extrahepatic biliary ductal dilation. Normal gallbladder. Pancreas: No focal lesions or main ductal dilation. Spleen: Unchanged splenomegaly measures 15.6 cm. Adrenals/Urinary Tract: No adrenal nodules. No suspicious renal mass, calculi or hydronephrosis. No focal bladder wall thickening. Stomach/Bowel: Normal appearance of the stomach. No evidence of bowel wall thickening, distention, or inflammatory changes. Normal appendix. Vascular/Lymphatic: No significant vascular findings are present. No enlarged abdominal or pelvic lymph nodes. Reproductive: No adnexal masses. Other: No free fluid, fluid collection, or free air. Musculoskeletal: No acute or abnormal lytic or blastic osseous lesions. Small fluid collection along the umbilical port site measures 3.9 x 1.8 x 1.7 cm 3:63, 7:89). Subcutaneous soft tissue stranding along the right aspect of the umbilical port site with a small amount of subcutaneous stranding extending into the bilateral flank. IMPRESSION: 1. Small fluid collection along the umbilical port site measures 3.9 x 1.8 x 1.7 cm. 2. Subcutaneous soft tissue stranding along the right aspect of the umbilical port site, likely cellulitis, with a small amount of subcutaneous stranding extending into the bilateral flank. 3. Unchanged splenomegaly. Electronically Signed   By: Milus Height.D.  On: 04/28/2022 15:01    I have  reviewed the patient's current medications.  Assessment/Plan: Postoperative cellulitis Continue last dose of Ancef at 1600 Anticipate discharge home after final dose of Ancef. PO keflex x 1 week and follow up with primary MD    LOS: 1 day   Warden Fillers 04/29/2022,12:22 PM

## 2022-04-29 NOTE — Discharge Summary (Signed)
Physician Discharge Summary  Patient ID: Kimberly [name] MRN: [id number] DOB/AGE: 1990-06-26 32 y.o.  Admit date: 04/28/2022 Discharge date: 04/29/2022  Admission Diagnoses: Umbilicus abscess with cellulitis  Discharge Diagnoses:  Principal Problem:   Postoperative cellulitis of surgical wound   Discharged Condition: good  Hospital Course: Kimberly [name] was admitted with above Dx. See admit H & P for additional information. She was placed on IV antibiotics and day wound care with packing. She responded well. Cellulitis was resolving, pain much improved.  Pt's boyfriend was taught had to perform wound care.  She progress to ambulating, voiding, tolerating diet, no fever, WBC decreasing and good oral pain control Felt amendable for discharge home Discharge instructions, medications and follow up reviewed. Pt verbalized understanding.    Consults: None  Significant Diagnostic Studies: labs  Treatments: IV hydration and antibiotics: Ancef  Discharge Exam: Blood pressure 111/65, pulse 78, temperature 98.1 F (36.7 C), temperature source Oral, resp. rate 17, last menstrual period 04/09/2022, SpO2 99 %. Lungs clear Heart RRR Abd soft, + BS, obese, cellulitis resolving. Ext nontender  Disposition: Discharge disposition: 01-Home or Self Care       Discharge Instructions     Call MD for:  difficulty breathing, headache or visual disturbances   Complete by: As directed    Call MD for:  extreme fatigue   Complete by: As directed    Call MD for:  hives   Complete by: As directed    Call MD for:  persistant dizziness or light-headedness   Complete by: As directed    Call MD for:  persistant nausea and vomiting   Complete by: As directed    Call MD for:  redness, tenderness, or signs of infection (pain, swelling, redness, odor or green/yellow discharge around incision site)   Complete by: As directed    Call MD for:  severe uncontrolled pain   Complete by: As directed    Call MD  for:  temperature >100.4   Complete by: As directed    Diet - low sodium heart healthy   Complete by: As directed    Discharge wound care:   Complete by: As directed    Daily wound packing as instructed   Increase activity slowly   Complete by: As directed       Allergies as of 04/29/2022       Reactions   Cold & Cough Daytime [acetaminophen-dm] Shortness Of Breath   Could not tolerate- orange liquid, may have contained Pseudoephedrine- "I could not tolerate"   Zofran [ondansetron] Other (See Comments)   Muscle Spasms   Abilify [aripiprazole] Other (See Comments)   Lethargy, unable to function   Amoxicillin-pot Clavulanate Other (See Comments)   Stomach pains Has patient had a PCN reaction causing immediate rash, facial/tongue/throat swelling, SOB or lightheadedness with hypotension: No Has patient had a PCN reaction causing severe rash involving mucus membranes or skin necrosis: No Has patient had a PCN reaction that required hospitalization No Has patient had a PCN reaction occurring within the last 10 years: Yes If all of the above answers are "NO", then may proceed with Cephalosporin use.   Cephalexin Other (See Comments)   Stomach pains Pt taking Cefadroxil as outpatient   Latex Swelling, Other (See Comments)   Burning of skin, too   Phenergan [promethazine]    'gave me twitches' 'not able to sit still'   Tape Swelling   Swelling, burning of skin .    Zicam Cold Remedy [homeopathic Products] Nausea  And Vomiting   Keflex [cephalexin] Rash, Other (See Comments)   Rash--able to take w/ Benadryl   Vicodin [hydrocodone-acetaminophen] Hives, Rash   Per pt- makes her very sleepy, feels like she isn't getting her breath. States she does not have throat/tongue swelling or anaphylaxis.        Medication List     STOP taking these medications    sulfamethoxazole-trimethoprim 800-160 MG tablet Commonly known as: BACTRIM DS       TAKE these medications    busPIRone  10 MG tablet Commonly known as: BUSPAR Take 1 tablet (10 mg total) by mouth 2 (two) times daily.   cephALEXin 500 MG capsule Commonly known as: KEFLEX Take 1 capsule (500 mg total) by mouth 3 (three) times daily.   FLUoxetine 20 MG capsule Commonly known as: PROZAC TAKE 1 CAPSULE(20 MG) BY MOUTH DAILY What changed:  how much to take how to take this when to take this   HYDROmorphone 2 MG tablet Commonly known as: DILAUDID Take 0.5 tablets (1 mg total) by mouth every 4 (four) hours as needed for severe pain. What changed: when to take this   ibuprofen 800 MG tablet Commonly known as: ADVIL Take 1 tablet (800 mg total) by mouth 3 (three) times daily. What changed:  when to take this reasons to take this   levothyroxine 100 MCG tablet Commonly known as: SYNTHROID Take 1 tablet (100 mcg total) by mouth daily.   meclizine 25 MG tablet Commonly known as: ANTIVERT TAKE 1 TABLET(25 MG) BY MOUTH THREE TIMES DAILY AS NEEDED FOR DIZZINESS What changed:  how much to take how to take this when to take this reasons to take this   omeprazole 20 MG capsule Commonly known as: PRILOSEC TAKE 1 CAPSULE(20 MG) BY MOUTH DAILY What changed:  how much to take how to take this when to take this   propranolol ER 60 MG 24 hr capsule Commonly known as: Inderal LA Take 1 capsule (60 mg total) by mouth daily.               Discharge Care Instructions  (From admission, onward)           Start     Ordered   04/29/22 0000  Discharge wound care:       Comments: Daily wound packing as instructed   04/29/22 1625            Follow-up Information     [facility] Follow up.   Specialty: Obstetrics and Gynecology Why: Pt already has Contact information: [address] [place] [address] [phone]                Signed: Hermina Staggers 04/29/2022, 4:26 PM

## 2022-04-29 NOTE — Progress Notes (Signed)
  Transition of Care [facility]) Screening Note   Patient Details  Name: Kimberly [name] Date of Birth: 04/21/1990   Transition of Care [facility]) CM/SW Contact:    Harriet Masson, RN Phone Number: 04/29/2022, 9:05 AM    Transition of Care Department [facility]) has reviewed patient and no TOC needs have been identified at this time. We will continue to monitor patient advancement through interdisciplinary progression rounds. If new patient transition needs arise, please place a TOC consult.

## 2022-04-30 ENCOUNTER — Telehealth: Payer: Self-pay

## 2022-04-30 LAB — AEROBIC CULTURE W GRAM STAIN (SUPERFICIAL SPECIMEN)

## 2022-04-30 NOTE — Transitions of Care (Post Inpatient/ED Visit) (Signed)
   04/30/2022  Name: Kimberly [name] MRN: [id number] DOB: 16-Apr-1990  Today's TOC FU Call Status: Today's TOC FU Call Status:: Successful TOC FU Call Competed TOC FU Call Complete Date: 04/30/22  Transition Care Management Follow-up Telephone Call Date of Discharge: 04/29/22 Discharge Facility: [facility]) Type of Discharge: Inpatient Admission Primary Inpatient Discharge Diagnosis:: post-op cellulitis of surgical wound How have you been since you were released from the hospital?: Better Any questions or concerns?: No  Items Reviewed: Did you receive and understand the discharge instructions provided?: Yes Medications obtained and verified?: Yes (Medications Reviewed) (She said they are just waiting for a PA for the dilaudid.  She reports that her pain is 3-4/10 when it had been 9/10 when hospitalized) Any new allergies since your discharge?: No Dietary orders reviewed?: Yes Type of Diet Ordered:: heart healthy Do you have support at home?: Yes People in Home: significant other  Home Care and Equipment/Supplies: Were Home Health Services Ordered?: No Any new equipment or medical supplies ordered?: No  Functional Questionnaire: Do you need assistance with bathing/showering or dressing?: No (She said her significant other will be doing the daily wound packing.  She said this is not his first time having to do wound care.  She said that they have enough dressing supplies at least until her follow up appointment with OB/GYN on 05/04/2022.) Do you need assistance with meal preparation?: No Do you need assistance with eating?: No Do you have difficulty maintaining continence: No Do you need assistance with getting out of bed/getting out of a chair/moving?: No Do you have difficulty managing or taking your medications?: No  Follow up appointments reviewed: PCP Follow-up appointment confirmed?: No (She said she will call for an appointment. She was just seen at [facility] 04/10/2022.) MD  Provider Line [phone] Given: No (She said she will call [facility] to schedule an appiointment .  She needs to have thyroid labs checked and will ask GYN to draw at her appt on 05/04/2022.) Specialist Hospital Follow-up appointment confirmed?: Yes Date of Specialist follow-up appointment?: 05/04/22 Follow-Up Specialty Provider:: OB/GYN, Georgiana Shore [place], [facility]- 05/22/2022. Do you need transportation to your follow-up appointment?: No Do you understand care options if your condition(s) worsen?: Yes-patient verbalized understanding  She said if OB/GYN is not able to have thyroid labs done on 05/04/2022, she will call this office to have orders placed for the labs.     SIGNATURE  Audie Box

## 2022-05-01 LAB — AEROBIC CULTURE W GRAM STAIN (SUPERFICIAL SPECIMEN)

## 2022-05-03 LAB — CULTURE, BLOOD (ROUTINE X 2)
Culture: NO GROWTH
Culture: NO GROWTH
Special Requests: ADEQUATE

## 2022-05-04 ENCOUNTER — Ambulatory Visit (INDEPENDENT_AMBULATORY_CARE_PROVIDER_SITE_OTHER): Payer: Medicaid Other | Admitting: Obstetrics and Gynecology

## 2022-05-04 ENCOUNTER — Other Ambulatory Visit: Payer: Self-pay

## 2022-05-04 ENCOUNTER — Ambulatory Visit: Payer: Medicaid Other | Admitting: Family Medicine

## 2022-05-04 ENCOUNTER — Encounter: Payer: Self-pay | Admitting: Obstetrics and Gynecology

## 2022-05-04 VITALS — BP 110/80 | HR 76 | Temp 98.1°F | Wt 264.2 lb

## 2022-05-04 DIAGNOSIS — Z5189 Encounter for other specified aftercare: Secondary | ICD-10-CM

## 2022-05-04 NOTE — Progress Notes (Deleted)
No chief complaint on file.   HISTORY OF PRESENT ILLNESS:  05/04/22 ALL:  Kimberly [name] is a 32 y.o. female here today for follow up for migraines. She was seen in consult with Dr Delena Bali 06/2021 and started on propranolol  BID and rizatriptan as needed. MRI was normal. She called 02/2022 reporting worsening headaches and scheduled for follow up with me.    HISTORY (copied from Dr Quentin Mulling previous note)  Medical co-morbidities: anxiety, scoliosis, lumbar degenerative disc disease   The patient presents for evaluation of headaches. She has had migraines since she was 16, but one month ago she developed new left sided headaches. Headaches described as aching, sharp, and throbbing pain which starts behind her ear and radiates toward her temple. They are associated with phonophobia, nausea, and vomiting. Initially they were constant. Now they will occur every ~2 days. Headaches last about 10 hours at a time. She has also noticed pain radiating from her neck down her left arm and paresthesias in her left hand.   She has taken Tylenol and naproxen as needed which only helps a little. Tried Fioricet which knocked her out the first couple of times she took it but did help. Migraine cocktail has helped somewhat. She was prescribed Topamax but didn't take it due to concern for side effects.   Headache History: Onset: 1 month ago Triggers: none Aura: no Location: left occiput, temple Quality/Description: sharp, throbbing, aching Associated Symptoms:             Photophobia: no             Phonophobia: yes             Nausea: yes Vomiting: yes Worse with activity?: yes Duration of headaches: 10 hours   Headache days per month: 15 Headache free days per month: 15   Current Treatment: Abortive Fioricet Tylenol   Preventative none   Prior Therapies                                 Fioricet Tylenol naproxen   REVIEW OF SYSTEMS: Out of a complete 14 system review of symptoms, the  patient complains only of the following symptoms, and all other reviewed systems are negative.   ALLERGIES: Allergies  Allergen Reactions   Cold & Cough Daytime [Acetaminophen-Dm] Shortness Of Breath    Could not tolerate- orange liquid, may have contained Pseudoephedrine- "I could not tolerate"   Zofran [Ondansetron] Other (See Comments)    Muscle Spasms   Abilify [Aripiprazole] Other (See Comments)    Lethargy, unable to function   Amoxicillin-Pot Clavulanate Other (See Comments)    Stomach pains Has patient had a PCN reaction causing immediate rash, facial/tongue/throat swelling, SOB or lightheadedness with hypotension: No Has patient had a PCN reaction causing severe rash involving mucus membranes or skin necrosis: No Has patient had a PCN reaction that required hospitalization No Has patient had a PCN reaction occurring within the last 10 years: Yes If all of the above answers are "NO", then may proceed with Cephalosporin use.    Cephalexin Other (See Comments)    Stomach pains Pt taking Cefadroxil as outpatient   Latex Swelling and Other (See Comments)    Burning of skin, too   Phenergan [Promethazine]     'gave me twitches' 'not able to sit still'   Tape Swelling    Swelling, burning of skin .  Zicam Cold Remedy [Homeopathic Products] Nausea And Vomiting   Keflex [Cephalexin] Rash and Other (See Comments)    Rash--able to take w/ Benadryl   Vicodin [Hydrocodone-Acetaminophen] Hives and Rash    Per pt- makes her very sleepy, feels like she isn't getting her breath. States she does not have throat/tongue swelling or anaphylaxis.     HOME MEDICATIONS: Outpatient Medications Prior to Visit  Medication Sig Dispense Refill   cephALEXin (KEFLEX) 500 MG capsule Take 1 capsule (500 mg total) by mouth 3 (three) times daily. 30 capsule 0   FLUoxetine (PROZAC) 20 MG capsule TAKE 1 CAPSULE(20 MG) BY MOUTH DAILY (Patient taking differently: Take 20 mg by mouth daily. TAKE 1  CAPSULE(20 MG) BY MOUTH DAILY) 90 capsule 1   HYDROmorphone (DILAUDID) 2 MG tablet Take 0.5 tablets (1 mg total) by mouth every 4 (four) hours as needed for severe pain. 30 tablet 0   ibuprofen (ADVIL) 800 MG tablet Take 1 tablet (800 mg total) by mouth 3 (three) times daily. 30 tablet 0   levothyroxine (SYNTHROID) 100 MCG tablet Take 1 tablet (100 mcg total) by mouth daily. 30 tablet 1   meclizine (ANTIVERT) 25 MG tablet TAKE 1 TABLET(25 MG) BY MOUTH THREE TIMES DAILY AS NEEDED FOR DIZZINESS (Patient taking differently: Take 25 mg by mouth 3 (three) times daily as needed for dizziness. TAKE 1 TABLET(25 MG) BY MOUTH THREE TIMES DAILY AS NEEDED FOR DIZZINESS) 90 tablet 11   omeprazole (PRILOSEC) 20 MG capsule TAKE 1 CAPSULE(20 MG) BY MOUTH DAILY (Patient taking differently: Take 20 mg by mouth daily. TAKE 1 CAPSULE(20 MG) BY MOUTH DAILY) 90 capsule 1   propranolol ER (INDERAL LA) 60 MG 24 hr capsule Take 1 capsule (60 mg total) by mouth daily. 90 capsule 1   No facility-administered medications prior to visit.     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    Iron deficiency anemia    Migraines    "weekly" (11/16/2012)   Oligomenorrhea 03/01/2011   Ovarian cyst    PONV (postoperative nausea and vomiting)    Pregnancy induced hypertension    Schizophrenia    "moderate" (11/16/2012)   Scoliosis      PAST SURGICAL HISTORY: Past Surgical History:  Procedure Laterality Date   CESAREAN SECTION N/A 08/02/2019   Procedure: CESAREAN SECTION;  Surgeon: Kathrynn Running, MD;   Location: [facility] LD ORS;  Service: Obstetrics;  Laterality: N/A;   HYSTEROSCOPY WITH D & C N/A 04/14/2022   Procedure: DILATATION AND CURETTAGE;  Surgeon: Hermina Staggers, MD;  Location: [facility] OR;  Service: Gynecology;  Laterality: N/A;   LAPAROSCOPIC LYSIS OF ADHESIONS N/A 04/14/2022   Procedure: LAPAROSCOPIC LYSIS OF ADHESIONS;  Surgeon: Hermina Staggers, MD;  Location: [facility] OR;  Service: Gynecology;  Laterality: N/A;   LAPAROSCOPIC OVARIAN CYSTECTOMY Left 02/11/2016   Procedure: LAPAROSCOPIC OVARIAN CYSTECTOMY;  Surgeon: Allie Bossier, MD;  Location: [facility] ORS;  Service: Gynecology;  Laterality: Left;   LAPAROSCOPIC OVARIAN CYSTECTOMY Left 04/14/2022  Procedure: LAPAROSCOPIC OVARIAN CYSTECTOMY;  Surgeon: Hermina Staggers, MD;  Location: [facility] OR;  Service: Gynecology;  Laterality: Left;   LAPAROSCOPY N/A 04/14/2022   Procedure: LAPAROSCOPY DIAGNOSTIC;  Surgeon: Hermina Staggers, MD;  Location: [facility] OR;  Service: Gynecology;  Laterality: N/A;   UPPER GI ENDOSCOPY  ~ 2006; ~2008     FAMILY HISTORY: Family History  Problem Relation Age of Onset   Diabetes Mother    Hypertension Mother    Vision loss Mother    Hypertension Father    Hyperlipidemia Father    Thyroid disease Maternal Grandmother      SOCIAL HISTORY: Social History   Socioeconomic History   Marital status: Single    Spouse name: Not on file   Number of children: 2   Years of education: Not on file   Highest education level: 12th grade  Occupational History    Comment: at vets office  Tobacco Use   Smoking status: Never   Smokeless tobacco: Never  Vaping Use   Vaping Use: Never used  Substance and Sexual Activity   Alcohol use: Never   Drug use: No   Sexual activity: Yes    Birth control/protection: None  Other Topics Concern   Not on file  Social History Narrative   Lives with children   Caffeine- maybe 1 soda a day   Social Determinants of Health   Financial Resource Strain: Low Risk  (04/09/2022)   Overall Financial  Resource Strain (CARDIA)    Difficulty of Paying Living Expenses: Not very hard  Food Insecurity: No Food Insecurity (04/28/2022)   Hunger Vital Sign    Worried About Running Out of Food in the Last Year: Never true    Ran Out of Food in the Last Year: Never true  Transportation Needs: No Transportation Needs (04/28/2022)   PRAPARE - Administrator, Civil Service (Medical): No    Lack of Transportation (Non-Medical): No  Physical Activity: Insufficiently Active (04/09/2022)   Exercise Vital Sign    Days of Exercise per Week: 2 days    Minutes of Exercise per Session: 10 min  Stress: Stress Concern Present (04/09/2022)   [employer] of Occupational Health - Occupational Stress Questionnaire    Feeling of Stress : To some extent  Social Connections: Socially Isolated (04/09/2022)   Social Connection and Isolation Panel [NHANES]    Frequency of Communication with Friends and Family: More than three times a week    Frequency of Social Gatherings with Friends and Family: More than three times a week    Attends Religious Services: Never    Database administrator or Organizations: No    Attends Engineer, structural: Not on file    Marital Status: Never married  Intimate Partner Violence: Not At Risk (04/28/2022)   Humiliation, Afraid, Rape, and Kick questionnaire    Fear of Current or Ex-Partner: No    Emotionally Abused: No    Physically Abused: No    Sexually Abused: No     PHYSICAL EXAM  There were no vitals filed for this visit. There is no height or weight on file to calculate BMI.  Generalized: Well developed, in no acute distress  Cardiology: normal rate and rhythm, no murmur auscultated  Respiratory: clear to auscultation bilaterally    Neurological examination  Mentation: Alert oriented to time, place, history taking. Follows all commands speech and language fluent Cranial nerve II-XII: Pupils were equal round reactive to light. Extraocular movements  were full, visual field were full on confrontational test. Facial sensation and strength were normal. Uvula tongue midline. Head turning and shoulder shrug  were normal and symmetric. Motor: The motor testing reveals 5 over 5 strength of all 4 extremities. Good symmetric motor tone is noted throughout.  Sensory: Sensory testing is intact to soft touch on all 4 extremities. No evidence of extinction is noted.  Coordination: Cerebellar testing reveals good finger-nose-finger and heel-to-shin bilaterally.  Gait and station: Gait is normal. Tandem gait is normal. Romberg is negative. No drift is seen.  Reflexes: Deep tendon reflexes are symmetric and normal bilaterally.    DIAGNOSTIC DATA (LABS, IMAGING, TESTING) - I reviewed patient records, labs, notes, testing and imaging myself where available.  Lab Results  Component Value Date   WBC 11.9 (H) 04/29/2022   HGB 8.1 (L) 04/29/2022   HCT 26.9 (L) 04/29/2022   MCV 75.4 (L) 04/29/2022   PLT 456 (H) 04/29/2022      Component Value Date/Time   NA 136 04/28/2022 1106   NA 141 09/01/2021 1615   K 3.6 04/28/2022 1106   CL 105 04/28/2022 1106   CO2 20 (L) 04/28/2022 1106   GLUCOSE 97 04/28/2022 1106   BUN 9 04/28/2022 1106   BUN 9 09/01/2021 1615   CREATININE 0.57 04/28/2022 1106   CREATININE 0.34 (L) 07/24/2015 1034   CALCIUM 8.5 (L) 04/28/2022 1106   PROT 7.3 04/28/2022 1106   PROT 6.8 09/01/2021 1615   ALBUMIN 2.7 (L) 04/28/2022 1106   ALBUMIN 4.3 09/01/2021 1615   AST 20 04/28/2022 1106   ALT 21 04/28/2022 1106   ALKPHOS 111 04/28/2022 1106   BILITOT 0.2 (L) 04/28/2022 1106   BILITOT 0.5 09/01/2021 1615   GFRNONAA >60 04/28/2022 1106   GFRNONAA >89 08/07/2013 1141   GFRAA >60 08/10/2019 0524   GFRAA >89 08/07/2013 1141   Lab Results  Component Value Date   CHOL 178 06/08/2016   HDL 41 06/08/2016   LDLCALC 90 06/08/2016   TRIG 237 (H) 06/08/2016   CHOLHDL 4.3 06/08/2016   Lab Results  Component Value Date   HGBA1C 5.1  09/01/2021   No results found for: "VITAMINB12" Lab Results  Component Value Date   TSH 6.470 (H) 04/10/2022        No data to display               No data to display           ASSESSMENT AND PLAN  32 y.o. 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 here with    No diagnosis found.  Archer Asa ***.  Healthy lifestyle habits encouraged. *** will follow up with PCP as directed. *** will return to see me in ***, sooner if needed. *** verbalizes understanding and agreement with this plan.   No orders of the defined types were placed in this encounter.    No orders of the defined types were placed in this encounter.    Shawnie Dapper, MSN, FNP-C 05/04/2022, 2:14 PM  [facility] [address] [place], [place] [address] [phone]

## 2022-05-04 NOTE — Progress Notes (Signed)
Obstetrics and Gynecology Visit Return Patient Evaluation  Appointment Date: 05/04/2022  Primary Care Provider: Hoy Register  OBGYN Clinic: [facility]  Chief Complaint: scheduled wound check  History of Present Illness:  Kimberly [name] is a 32 y.o. s/p 3/26 l/s ovarian cystectomy and d&c with Dr. Alysia Penna. Surg path benign. Patient admitted on 4/9 overnight for SSI, after previously being dx with SSI and started on bactrim on 4/4. Pt had wound drained at the bedside, culture done and abx continued. Culture came back staph susceptible to bactrim and oxacilin, and was continued on keflex at discharge. Her and her partner were okay with doing dressing changes at home.   Interval History: Since that time, she states that she is continuing on the abx and does qday dressing changes along with showers. She states the area continues to feel much better.   Review of Systems: as noted in the History of Present Illness.  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 Kimberly [name]" had no medications administered during this visit. Current Outpatient Medications  Medication Sig Dispense Refill   cephALEXin (KEFLEX) 500 MG capsule Take 1 capsule (500 mg total) by mouth 3 (three) times daily. 30 capsule 0   FLUoxetine (PROZAC) 20 MG capsule TAKE 1 CAPSULE(20 MG)  BY MOUTH DAILY (Patient taking differently: Take 20 mg by mouth daily. TAKE 1 CAPSULE(20 MG) BY MOUTH DAILY) 90 capsule 1   HYDROmorphone (DILAUDID) 2 MG tablet Take 0.5 tablets (1 mg total) by mouth every 4 (four) hours as needed for severe pain. 30 tablet 0   ibuprofen (ADVIL) 800 MG tablet Take 1 tablet (800 mg total) by mouth 3 (three) times daily. 30 tablet 0   levothyroxine (SYNTHROID) 100 MCG tablet Take 1 tablet (100 mcg total) by mouth daily. 30 tablet 1   meclizine (ANTIVERT) 25 MG tablet TAKE 1 TABLET(25 MG) BY MOUTH THREE TIMES DAILY AS NEEDED FOR DIZZINESS (Patient taking differently: Take 25 mg by mouth 3 (three) times daily as needed for dizziness. TAKE 1 TABLET(25 MG) BY MOUTH THREE TIMES DAILY AS NEEDED FOR DIZZINESS) 90 tablet 11   omeprazole (PRILOSEC) 20 MG capsule TAKE 1 CAPSULE(20 MG) BY MOUTH DAILY (Patient taking differently: Take 20 mg by mouth daily. TAKE 1 CAPSULE(20 MG) BY MOUTH DAILY) 90 capsule 1   propranolol ER (INDERAL LA) 60 MG 24 hr capsule Take 1 capsule (60 mg total) by mouth daily. 90 capsule 1   No current facility-administered medications for this visit.    Allergies: is allergic to cold & cough daytime [acetaminophen-dm], zofran [ondansetron], abilify [aripiprazole], amoxicillin-pot clavulanate, cephalexin, latex, phenergan [promethazine], tape, zicam cold remedy [homeopathic products], keflex [cephalexin], and vicodin [hydrocodone-acetaminophen].  Physical Exam:  BP 110/80   Pulse 76   Temp 98.1 F (36.7 C)   Wt 264 lb 3.2 oz (119.8 kg)   LMP 05/03/2022 (Exact Date)   BMI 45.35 kg/m  Body mass index is 45.35 kg/m. General appearance: Well nourished, well developed female in no acute distress.  Abdomen: Old packing removed and with yellowish discharge. Incision does not track in any direction and is straight down. Tough exudate removed and skin/incision edges with healthy granulation tissue and defect is about 6-7cm in depth. Area irrigated and  re-packed diffusely non tender to palpation, non distended, and no masses, hernias Neuro/Psych:  Normal mood and affect.   Assessment: pt doing well  Plan:  1. Visit for wound check Continue qday daily wet to dry changes and I told them that if they are are okay with it to try to do bid for faster healing. Continue abx until complete and f/u for weekly wound check visits   Return in about 1 week (around 05/11/2022) for in person.  Future Appointments  Date Time Provider Department Center  05/04/2022  3:00 PM Shawnie Dapper, NP [facility] None  05/11/2022  1:55 PM Hermina Staggers, MD [facility]  05/22/2022  3:00 PM Drucilla Chalet, RPH-CPP [facility] None  06/17/2022  2:00 PM Princess Bruins, DO [facility] None    Cornelia Copa MD Attending Center for [facility])

## 2022-05-11 ENCOUNTER — Other Ambulatory Visit: Payer: Self-pay

## 2022-05-11 ENCOUNTER — Ambulatory Visit (INDEPENDENT_AMBULATORY_CARE_PROVIDER_SITE_OTHER): Payer: Medicaid Other | Admitting: Obstetrics and Gynecology

## 2022-05-11 ENCOUNTER — Encounter: Payer: Self-pay | Admitting: Obstetrics and Gynecology

## 2022-05-11 VITALS — BP 121/78 | HR 87 | Ht 64.0 in | Wt 265.4 lb

## 2022-05-11 DIAGNOSIS — T8149XA Infection following a procedure, other surgical site, initial encounter: Secondary | ICD-10-CM

## 2022-05-11 DIAGNOSIS — Z9889 Other specified postprocedural states: Secondary | ICD-10-CM | POA: Insufficient documentation

## 2022-05-11 NOTE — Progress Notes (Signed)
Ms Laforte is here for post op follow up, S/P lap ovarian cystectomy Complicated by post op wound infection. She reports doing well. Denies any fever, chills or pain Denies any bowel or bladder dysfunction Normal cycle since surgery Partner has been packing wound but feels wound has now closed  Pathology reviewed with pt   PE AF VSS Lungs clear Heart RRR Abd soft + BS Incision probed and noted to be closed. No S/Sx of infection  A/P Post op visit Advised to stop wound packing. General wound care reviewed with pt Return to ADL's as tolerates. Condoms for contraception. F/U PRN

## 2022-05-15 ENCOUNTER — Encounter: Payer: Self-pay | Admitting: Family Medicine

## 2022-05-18 ENCOUNTER — Other Ambulatory Visit: Payer: Self-pay | Admitting: Family Medicine

## 2022-05-18 MED ORDER — FLUCONAZOLE 150 MG PO TABS: 150.0000 mg | ORAL_TABLET | Freq: Once | ORAL | 0 refills | Status: AC

## 2022-05-20 DIAGNOSIS — R32 Unspecified urinary incontinence: Secondary | ICD-10-CM | POA: Diagnosis not present

## 2022-05-20 DIAGNOSIS — Z419 Encounter for procedure for purposes other than remedying health state, unspecified: Secondary | ICD-10-CM | POA: Diagnosis not present

## 2022-05-22 ENCOUNTER — Ambulatory Visit: Payer: Medicaid Other | Admitting: Pharmacist

## 2022-05-31 ENCOUNTER — Encounter: Payer: Self-pay | Admitting: Obstetrics and Gynecology

## 2022-06-01 ENCOUNTER — Encounter: Payer: Self-pay | Admitting: Family Medicine

## 2022-06-02 ENCOUNTER — Other Ambulatory Visit: Payer: Self-pay | Admitting: Family Medicine

## 2022-06-02 DIAGNOSIS — E071 Dyshormogenetic goiter: Secondary | ICD-10-CM

## 2022-06-02 MED ORDER — LEVOTHYROXINE SODIUM 100 MCG PO TABS: 100.0000 ug | ORAL_TABLET | Freq: Every day | ORAL | 1 refills | Status: DC

## 2022-06-03 ENCOUNTER — Other Ambulatory Visit: Payer: Self-pay | Admitting: Family Medicine

## 2022-06-03 DIAGNOSIS — F419 Anxiety disorder, unspecified: Secondary | ICD-10-CM

## 2022-06-03 DIAGNOSIS — I10 Essential (primary) hypertension: Secondary | ICD-10-CM

## 2022-06-03 MED ORDER — FLUOXETINE HCL 20 MG PO CAPS: ORAL_CAPSULE | ORAL | 1 refills | Status: DC

## 2022-06-03 MED ORDER — PROPRANOLOL HCL ER 60 MG PO CP24: 60.0000 mg | ORAL_CAPSULE | Freq: Every day | ORAL | 1 refills | Status: DC

## 2022-06-10 ENCOUNTER — Ambulatory Visit: Payer: Medicaid Other | Admitting: Obstetrics and Gynecology

## 2022-06-17 ENCOUNTER — Ambulatory Visit (HOSPITAL_COMMUNITY): Payer: Medicaid Other | Admitting: Student

## 2022-06-20 DIAGNOSIS — R32 Unspecified urinary incontinence: Secondary | ICD-10-CM | POA: Diagnosis not present

## 2022-06-20 DIAGNOSIS — Z419 Encounter for procedure for purposes other than remedying health state, unspecified: Secondary | ICD-10-CM | POA: Diagnosis not present

## 2022-06-24 ENCOUNTER — Ambulatory Visit (INDEPENDENT_AMBULATORY_CARE_PROVIDER_SITE_OTHER): Payer: Medicaid Other | Admitting: Student

## 2022-06-24 ENCOUNTER — Encounter (HOSPITAL_COMMUNITY): Payer: Self-pay | Admitting: Student

## 2022-06-24 DIAGNOSIS — F411 Generalized anxiety disorder: Secondary | ICD-10-CM

## 2022-06-24 DIAGNOSIS — F319 Bipolar disorder, unspecified: Secondary | ICD-10-CM

## 2022-06-24 DIAGNOSIS — E071 Dyshormogenetic goiter: Secondary | ICD-10-CM

## 2022-06-24 DIAGNOSIS — F32A Depression, unspecified: Secondary | ICD-10-CM

## 2022-06-24 MED ORDER — FLUOXETINE HCL 20 MG PO CAPS
ORAL_CAPSULE | ORAL | 0 refills | Status: DC
Start: 2022-06-24 — End: 2022-08-07

## 2022-06-24 MED ORDER — LURASIDONE HCL 20 MG PO TABS
20.0000 mg | ORAL_TABLET | Freq: Every day | ORAL | 0 refills | Status: DC
Start: 2022-06-24 — End: 2022-08-07
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
## 2022-06-24 NOTE — Patient Instructions (Addendum)
Dear Joni Reining, Please take latuda with food Take with dinner if it makes you sleepy Take with breakfast/lunch if it disturbs your sleep For help with your children, google autism society of [place] - https://www.autismsociety-[place].org/   Non-Emergent / Urgent  [facility] [address], SECOND FLOOR [place], [place] [address] [phone] OUTPATIENT Walk-in information: Please note, all walk-ins are first come & first serve, with limited number of availability.  Please note that to be eligible for services you must bring: ID or a piece of mail with your name [facility] address  Therapist for therapy:  Monday & Wednesdays: Please ARRIVE at 7:15 AM for registration Will START at 8:00 AM Every 1st & 2nd Friday of the month: Please ARRIVE at 10:15 AM for registration Will START at 1 PM - 5 PM  Psychiatrist for medication management: Monday - Friday:  Please ARRIVE at 7:15 AM for registration Will START at 8:00 AM  Regretfully, due to limited availability, please be aware that you may not been seen on the same day as walk-in. Please consider making an appoint or try again. Thank you for your patience and understanding. ________________________________________________________  SAFETY CRISIS  Dial 988 for National Suicide & Crisis Lifeline  Dial 911  [facility]:  [address] FIRST FLOOR.  [place], [place] [address].  [phone] ________________________________________________________  To see which pharmacy near you is the CHEAPEST for certain medications, please use GoodRx. It is free website and has a free phone app.    Also consider looking at [facility] $4.00 or [facility]'s $7.00 prescription list. Both are free to view if googled "[facility] $4 prescription" and "public's $7 prescription". These are set prices, no insurance required. [facility] low cost medications: $4-$15 for 30days prescriptions or $10-$38 for  90days prescriptions  ________________________________________________________  Difficulties with sleep?   Can also use this free app for insomnia called CBT-I. Let your doctors and therapists know so they can help with extra tips and tricks or for guidance and accountability. NO ADDS on the app.

## 2022-07-20 ENCOUNTER — Other Ambulatory Visit (HOSPITAL_COMMUNITY): Payer: Medicaid Other

## 2022-07-20 DIAGNOSIS — R32 Unspecified urinary incontinence: Secondary | ICD-10-CM | POA: Diagnosis not present

## 2022-07-20 DIAGNOSIS — Z419 Encounter for procedure for purposes other than remedying health state, unspecified: Secondary | ICD-10-CM | POA: Diagnosis not present

## 2022-07-25 ENCOUNTER — Other Ambulatory Visit: Payer: Self-pay | Admitting: Family Medicine

## 2022-07-25 ENCOUNTER — Other Ambulatory Visit: Payer: Self-pay | Admitting: Nurse Practitioner

## 2022-07-25 DIAGNOSIS — I10 Essential (primary) hypertension: Secondary | ICD-10-CM

## 2022-07-26 ENCOUNTER — Other Ambulatory Visit: Payer: Self-pay | Admitting: Family Medicine

## 2022-07-26 DIAGNOSIS — K219 Gastro-esophageal reflux disease without esophagitis: Secondary | ICD-10-CM

## 2022-07-27 ENCOUNTER — Other Ambulatory Visit: Payer: Self-pay | Admitting: Family Medicine

## 2022-07-27 ENCOUNTER — Encounter: Payer: Self-pay | Admitting: Family Medicine

## 2022-07-27 DIAGNOSIS — E071 Dyshormogenetic goiter: Secondary | ICD-10-CM

## 2022-07-27 MED ORDER — LEVOTHYROXINE SODIUM 100 MCG PO TABS: 100.0000 ug | ORAL_TABLET | Freq: Every day | ORAL | 0 refills | Status: DC

## 2022-07-27 NOTE — Telephone Encounter (Signed)
Requested Prescriptions  Pending Prescriptions Disp Refills   omeprazole (PRILOSEC) 20 MG capsule [Pharmacy Med Name: OMEPRAZOLE 20MG  CAPSULES] 90 capsule 1    Sig: TAKE 1 CAPSULE(20 MG) BY MOUTH DAILY     Gastroenterology: Proton Pump Inhibitors Passed - 07/26/2022  1:05 AM      Passed - Valid encounter within last 12 months    Recent Outpatient Visits           3 months ago Primary hypertension   [place] [facility] [place], [place] W, NP   7 months ago Abnormal uterine bleeding due to endometrial polyp   [place] [facility] [place], Odette Horns, MD   10 months ago History of diet controlled gestational diabetes mellitus (GDM)   [place] [facility] [place], Odette Horns, MD   1 year ago Migraine without aura and without status migrainosus, not intractable   [place] [facility] [place], Odette Horns, MD   1 year ago Migraine without aura and without status migrainosus, not intractable   [facility] [address] [place], Shea Stakes, NP

## 2022-07-27 NOTE — Telephone Encounter (Signed)
Refilled 06/03/22 # 90 with 1 refill. Requested Prescriptions  Refused Prescriptions Disp Refills   propranolol ER (INDERAL LA) 60 MG 24 hr capsule [Pharmacy Med Name: PROPRANOLOL ER 60MG  CAPSULES] 90 capsule 1    Sig: TAKE 1 CAPSULE(60 MG) BY MOUTH DAILY     Cardiovascular:  Beta Blockers Passed - 07/25/2022  9:58 PM      Passed - Last BP in normal range    BP Readings from Last 1 Encounters:  05/11/22 121/78         Passed - Last Heart Rate in normal range    Pulse Readings from Last 1 Encounters:  05/11/22 87         Passed - Valid encounter within last 6 months    Recent Outpatient Visits           3 months ago Primary hypertension   [place] [facility] [place], [place] W, NP   7 months ago Abnormal uterine bleeding due to endometrial polyp   [place] [facility] [place], Odette Horns, MD   10 months ago History of diet controlled gestational diabetes mellitus (GDM)   [place] [facility] [place], Odette Horns, MD   1 year ago Migraine without aura and without status migrainosus, not intractable   [place] [facility] [place], Odette Horns, MD   1 year ago Migraine without aura and without status migrainosus, not intractable   [facility] [place], Shea Stakes, NP

## 2022-07-27 NOTE — Telephone Encounter (Signed)
Unable to refill per protocol, Rx expired. Discontinued 11/20/21, dose change.  Requested Prescriptions  Pending Prescriptions Disp Refills   levothyroxine (SYNTHROID) 88 MCG tablet [Pharmacy Med Name: LEVOTHYROXINE 0.088MG  ( ) TAB] 30 tablet 3    Sig: TAKE 1 TABLET(88 MCG) BY MOUTH DAILY     Endocrinology:  Hypothyroid Agents Failed - 07/25/2022  8:42 PM      Failed - TSH in normal range and within 360 days    TSH  Date Value Ref Range Status  04/10/2022 6.470 (H) 0.450 - 4.500 uIU/mL Final         Passed - Valid encounter within last 12 months    Recent Outpatient Visits           3 months ago Primary hypertension   [facility] [place], [place] W, NP   7 months ago Abnormal uterine bleeding due to endometrial polyp   [place] [facility] [place], Odette Horns, MD   10 months ago History of diet controlled gestational diabetes mellitus (GDM)   [place] [facility] [place], Odette Horns, MD   1 year ago Migraine without aura and without status migrainosus, not intractable   [place] [facility] [place], Odette Horns, MD   1 year ago Migraine without aura and without status migrainosus, not intractable   [facility] [place], Shea Stakes, NP

## 2022-07-30 ENCOUNTER — Encounter (HOSPITAL_COMMUNITY): Payer: Medicaid Other | Admitting: Student

## 2022-08-06 ENCOUNTER — Encounter (HOSPITAL_COMMUNITY): Payer: Medicaid Other | Admitting: Student

## 2022-08-06 ENCOUNTER — Encounter: Payer: Self-pay | Admitting: Family Medicine

## 2022-08-06 ENCOUNTER — Encounter (HOSPITAL_COMMUNITY): Payer: Self-pay

## 2022-08-07 ENCOUNTER — Telehealth (HOSPITAL_COMMUNITY): Payer: Self-pay

## 2022-08-07 ENCOUNTER — Other Ambulatory Visit (HOSPITAL_COMMUNITY): Payer: Self-pay

## 2022-08-07 ENCOUNTER — Telehealth (HOSPITAL_COMMUNITY): Payer: Self-pay | Admitting: Student

## 2022-08-07 DIAGNOSIS — F32A Depression, unspecified: Secondary | ICD-10-CM

## 2022-08-07 DIAGNOSIS — F319 Bipolar disorder, unspecified: Secondary | ICD-10-CM

## 2022-08-07 DIAGNOSIS — F411 Generalized anxiety disorder: Secondary | ICD-10-CM

## 2022-08-07 MED ORDER — FLUOXETINE HCL 20 MG PO CAPS
ORAL_CAPSULE | ORAL | 0 refills | Status: DC
Start: 2022-08-07 — End: 2022-10-28

## 2022-08-07 MED ORDER — LURASIDONE HCL 20 MG PO TABS
20.0000 mg | ORAL_TABLET | Freq: Every day | ORAL | 0 refills | Status: DC
Start: 2022-08-07 — End: 2022-08-10

## 2022-08-07 NOTE — Telephone Encounter (Signed)
Patient called for a refill on Prozac and Latuda. Last filled on 6/5. Patient has an appointment on 7/25 but is out of both meds. I send a 30 day supply of each to the pharmacy and confirmed patients lab appointment and her follow up

## 2022-08-10 ENCOUNTER — Other Ambulatory Visit (HOSPITAL_COMMUNITY): Payer: Medicaid Other

## 2022-08-10 MED ORDER — LURASIDONE HCL 20 MG PO TABS
20.0000 mg | ORAL_TABLET | Freq: Every day | ORAL | 0 refills | Status: DC
Start: 2022-08-10 — End: 2022-10-28

## 2022-08-10 NOTE — Telephone Encounter (Signed)
1 week bridge for latuda 20 mg daily provided until next appointment

## 2022-08-13 ENCOUNTER — Encounter (HOSPITAL_COMMUNITY): Payer: Medicaid Other | Admitting: Student

## 2022-08-19 DIAGNOSIS — Z1231 Encounter for screening mammogram for malignant neoplasm of breast: Secondary | ICD-10-CM

## 2022-08-20 DIAGNOSIS — Z419 Encounter for procedure for purposes other than remedying health state, unspecified: Secondary | ICD-10-CM | POA: Diagnosis not present

## 2022-08-20 DIAGNOSIS — R32 Unspecified urinary incontinence: Secondary | ICD-10-CM | POA: Diagnosis not present

## 2022-09-03 ENCOUNTER — Other Ambulatory Visit: Payer: Self-pay | Admitting: Family Medicine

## 2022-09-03 DIAGNOSIS — E071 Dyshormogenetic goiter: Secondary | ICD-10-CM

## 2022-09-04 MED ORDER — LEVOTHYROXINE SODIUM 100 MCG PO TABS
100.0000 ug | ORAL_TABLET | Freq: Every day | ORAL | 0 refills | Status: DC
Start: 2022-09-04 — End: 2022-10-06

## 2022-09-20 DIAGNOSIS — Z419 Encounter for procedure for purposes other than remedying health state, unspecified: Secondary | ICD-10-CM | POA: Diagnosis not present

## 2022-09-20 DIAGNOSIS — R32 Unspecified urinary incontinence: Secondary | ICD-10-CM | POA: Diagnosis not present

## 2022-10-06 ENCOUNTER — Other Ambulatory Visit: Payer: Self-pay | Admitting: Family Medicine

## 2022-10-06 DIAGNOSIS — R42 Dizziness and giddiness: Secondary | ICD-10-CM

## 2022-10-06 DIAGNOSIS — E071 Dyshormogenetic goiter: Secondary | ICD-10-CM

## 2022-10-06 NOTE — Telephone Encounter (Signed)
Medication Refill - Medication: meclizine (ANTIVERT) 25 MG tablet , levothyroxine (SYNTHROID) 100 MCG tablet   Pt asked if it was possible to get a courtesy refill to get her through until her appointment 10/09. Please advise.   Has the patient contacted their pharmacy? No. (Agent: If no, request that the patient contact the pharmacy for the refill. If patient does not wish to contact the pharmacy document the reason why and proceed with request.)   Preferred Pharmacy (with phone number or street name):  [id number] - [place], [place] - [address] AT [facility] OF [address]  [address] [place] [facility] [id number]  Phone: [phone] Fax: [phone]  Hours: Open 24 hours   Has the patient been seen for an appointment in the last year OR does the patient have an upcoming appointment? Yes.    Agent: Please be advised that RX refills may take up to 3 business days. We ask that you follow-up with your pharmacy.

## 2022-10-07 ENCOUNTER — Encounter: Payer: Self-pay | Admitting: Family Medicine

## 2022-10-07 MED ORDER — LEVOTHYROXINE SODIUM 100 MCG PO TABS
100.0000 ug | ORAL_TABLET | Freq: Every day | ORAL | 0 refills | Status: DC
Start: 2022-10-07 — End: 2022-10-08

## 2022-10-07 NOTE — Telephone Encounter (Signed)
Requested medication (s) are due for refill today - yes  Requested medication (s) are on the active medication list -yes  Future visit scheduled -yes  Last refill: 09/01/21 #[id number]  Notes to clinic: non delegated Rx  Requested Prescriptions  Pending Prescriptions Disp Refills   meclizine (ANTIVERT) 25 MG tablet 90 tablet 11    Sig: TAKE 1 TABLET(25 MG) BY MOUTH THREE TIMES DAILY AS NEEDED FOR DIZZINESS     Not Delegated - Gastroenterology: Antiemetics Failed - 10/06/2022  3:22 PM      Failed - This refill cannot be delegated      Passed - Valid encounter within last 6 months    Recent Outpatient Visits           6 months ago Primary hypertension   [place] [facility] [place], [place] W, NP   10 months ago Abnormal uterine bleeding due to endometrial polyp   [place] [facility] Hoy Register, MD   1 year ago History of diet controlled gestational diabetes mellitus (GDM)   [place] [facility] [place], Odette Horns, MD   1 year ago Migraine without aura and without status migrainosus, not intractable   [facility] [place], Odette Horns, MD   1 year ago Migraine without aura and without status migrainosus, not intractable   [place] [facility] [place], Shea Stakes, NP       Future Appointments             In 3 weeks Anders Simmonds, [place] [facility]            Signed Prescriptions Disp Refills   levothyroxine (SYNTHROID) 100 MCG tablet 30 tablet 0    Sig: Take 1 tablet (100 mcg total) by mouth daily.     Endocrinology:  Hypothyroid Agents Failed - 10/06/2022  3:22 PM      Failed - TSH in normal range and within 360 days    TSH  Date Value Ref Range Status  04/10/2022 6.470 (H) 0.450 - 4.500 uIU/mL Final         Passed - Valid encounter within last 12 months    Recent Outpatient Visits           6  months ago Primary hypertension   [place] [place], [place] W, NP   10 months ago Abnormal uterine bleeding due to endometrial polyp   [place] [facility] Hoy Register, MD   1 year ago History of diet controlled gestational diabetes mellitus (GDM)   [place] [facility] [place], Odette Horns, MD   1 year ago Migraine without aura and without status migrainosus, not intractable   [place] [facility] [place], Odette Horns, MD   1 year ago Migraine without aura and without status migrainosus, not intractable   [place] [facility] [place], Shea Stakes, NP       Future Appointments             In 3 weeks Sharon Seller, Marzella Schlein, [place] [facility]               Requested Prescriptions  Pending Prescriptions Disp Refills   meclizine (ANTIVERT) 25 MG tablet 90 tablet 11    Sig: TAKE 1 TABLET(25 MG) BY MOUTH THREE TIMES  DAILY AS NEEDED FOR DIZZINESS     Not Delegated - Gastroenterology: Antiemetics Failed - 10/06/2022  3:22 PM      Failed - This refill cannot be delegated      Passed - Valid encounter within last 6 months    Recent Outpatient Visits           6 months ago Primary hypertension   [place] [place], [place] W, NP   10 months ago Abnormal uterine bleeding due to endometrial polyp   [place] [facility] Hoy Register, MD   1 year ago History of diet controlled gestational diabetes mellitus (GDM)   [place] [facility] [place], Odette Horns, MD   1 year ago Migraine without aura and without status migrainosus, not intractable   [place] [facility] [place], Odette Horns, MD   1 year ago Migraine without aura and without status migrainosus, not intractable   [place] [facility] Claiborne Rigg, NP       Future Appointments             In 3 weeks Anders Simmonds, [place] [facility]            Signed Prescriptions Disp Refills   levothyroxine (SYNTHROID) 100 MCG tablet 30 tablet 0    Sig: Take 1 tablet (100 mcg total) by mouth daily.     Endocrinology:  Hypothyroid Agents Failed - 10/06/2022  3:22 PM      Failed - TSH in normal range and within 360 days    TSH  Date Value Ref Range Status  04/10/2022 6.470 (H) 0.450 - 4.500 uIU/mL Final         Passed - Valid encounter within last 12 months    Recent Outpatient Visits           6 months ago Primary hypertension   [place] [facility] [place], [place] W, NP   10 months ago Abnormal uterine bleeding due to endometrial polyp   [place] [facility] Hoy Register, MD   1 year ago History of diet controlled gestational diabetes mellitus (GDM)   [place] [facility] [place], Odette Horns, MD   1 year ago Migraine without aura and without status migrainosus, not intractable   [place] [facility] [place], Odette Horns, MD   1 year ago Migraine without aura and without status migrainosus, not intractable   [place] [facility] [place], Shea Stakes, NP       Future Appointments             In 3 weeks [place], [place] [facility]

## 2022-10-07 NOTE — Telephone Encounter (Signed)
Appointment- 10/28/22- needs labs Requested Prescriptions  Pending Prescriptions Disp Refills   meclizine (ANTIVERT) 25 MG tablet 90 tablet 11    Sig: TAKE 1 TABLET(25 MG) BY MOUTH THREE TIMES DAILY AS NEEDED FOR DIZZINESS     Not Delegated - Gastroenterology: Antiemetics Failed - 10/06/2022  3:22 PM      Failed - This refill cannot be delegated      Passed - Valid encounter within last 6 months    Recent Outpatient Visits           6 months ago Primary hypertension   [place] [facility] [place], [place] W, NP   10 months ago Abnormal uterine bleeding due to endometrial polyp   [place] [facility] Hoy Register, MD   1 year ago History of diet controlled gestational diabetes mellitus (GDM)   [place] [facility] [place], Odette Horns, MD   1 year ago Migraine without aura and without status migrainosus, not intractable   [place] [facility] [place], Odette Horns, MD   1 year ago Migraine without aura and without status migrainosus, not intractable   [place] [facility] [place], Shea Stakes, NP       Future Appointments             In 3 weeks [place], Marzella Schlein, [place] [facility]             levothyroxine (SYNTHROID) 100 MCG tablet 30 tablet 0    Sig: Take 1 tablet (100 mcg total) by mouth daily.     Endocrinology:  Hypothyroid Agents Failed - 10/06/2022  3:22 PM      Failed - TSH in normal range and within 360 days    TSH  Date Value Ref Range Status  04/10/2022 6.470 (H) 0.450 - 4.500 uIU/mL Final         Passed - Valid encounter within last 12 months    Recent Outpatient Visits           6 months ago Primary hypertension   [facility] [place], [place] W, NP   10 months ago Abnormal uterine bleeding due to endometrial polyp   [place] [facility] Hoy Register, MD   1 year ago History of diet controlled gestational diabetes mellitus (GDM)   [place] [facility] [place], Odette Horns, MD   1 year ago Migraine without aura and without status migrainosus, not intractable   [place] [facility] [place], Odette Horns, MD   1 year ago Migraine without aura and without status migrainosus, not intractable   [place] [facility] [place], Shea Stakes, NP       Future Appointments             In 3 weeks [place], [place] [facility]

## 2022-10-08 ENCOUNTER — Other Ambulatory Visit: Payer: Self-pay | Admitting: Family Medicine

## 2022-10-08 DIAGNOSIS — F411 Generalized anxiety disorder: Secondary | ICD-10-CM

## 2022-10-08 DIAGNOSIS — R42 Dizziness and giddiness: Secondary | ICD-10-CM

## 2022-10-08 DIAGNOSIS — F319 Bipolar disorder, unspecified: Secondary | ICD-10-CM

## 2022-10-08 DIAGNOSIS — E071 Dyshormogenetic goiter: Secondary | ICD-10-CM

## 2022-10-08 MED ORDER — MECLIZINE HCL 25 MG PO TABS
ORAL_TABLET | ORAL | 0 refills | Status: DC
Start: 2022-10-08 — End: 2022-10-08

## 2022-10-08 MED ORDER — LEVOTHYROXINE SODIUM 100 MCG PO TABS
100.0000 ug | ORAL_TABLET | Freq: Every day | ORAL | 0 refills | Status: DC
Start: 2022-10-08 — End: 2022-10-29

## 2022-10-08 MED ORDER — MECLIZINE HCL 25 MG PO TABS: ORAL_TABLET | ORAL | 0 refills | Status: DC

## 2022-10-20 DIAGNOSIS — Z419 Encounter for procedure for purposes other than remedying health state, unspecified: Secondary | ICD-10-CM | POA: Diagnosis not present

## 2022-10-28 ENCOUNTER — Ambulatory Visit: Payer: Medicaid Other | Attending: Physician Assistant | Admitting: Physician Assistant

## 2022-10-28 ENCOUNTER — Encounter: Payer: Self-pay | Admitting: Physician Assistant

## 2022-10-28 VITALS — BP 113/80 | HR 84 | Wt 278.2 lb

## 2022-10-28 DIAGNOSIS — F32A Depression, unspecified: Secondary | ICD-10-CM

## 2022-10-28 DIAGNOSIS — I10 Essential (primary) hypertension: Secondary | ICD-10-CM | POA: Diagnosis not present

## 2022-10-28 DIAGNOSIS — F419 Anxiety disorder, unspecified: Secondary | ICD-10-CM | POA: Diagnosis not present

## 2022-10-28 DIAGNOSIS — R42 Dizziness and giddiness: Secondary | ICD-10-CM

## 2022-10-28 DIAGNOSIS — R5383 Other fatigue: Secondary | ICD-10-CM | POA: Diagnosis not present

## 2022-10-28 DIAGNOSIS — K219 Gastro-esophageal reflux disease without esophagitis: Secondary | ICD-10-CM

## 2022-10-28 DIAGNOSIS — D508 Other iron deficiency anemias: Secondary | ICD-10-CM | POA: Diagnosis not present

## 2022-10-28 DIAGNOSIS — E071 Dyshormogenetic goiter: Secondary | ICD-10-CM

## 2022-10-28 DIAGNOSIS — R739 Hyperglycemia, unspecified: Secondary | ICD-10-CM | POA: Diagnosis not present

## 2022-10-28 MED ORDER — PROPRANOLOL HCL ER 60 MG PO CP24
60.0000 mg | ORAL_CAPSULE | Freq: Every day | ORAL | 1 refills | Status: DC
Start: 2022-10-28 — End: 2023-04-07

## 2022-10-28 MED ORDER — PRENATAL 27-0.8 MG PO TABS
1.0000 | ORAL_TABLET | Freq: Every day | ORAL | 1 refills | Status: DC
Start: 2022-10-28 — End: 2023-04-07

## 2022-10-28 MED ORDER — FLUOXETINE HCL 20 MG PO CAPS
ORAL_CAPSULE | ORAL | 1 refills | Status: DC
Start: 2022-10-28 — End: 2023-04-07

## 2022-10-28 MED ORDER — MECLIZINE HCL 25 MG PO TABS: ORAL_TABLET | ORAL | 0 refills | Status: DC

## 2022-10-28 MED ORDER — OMEPRAZOLE 20 MG PO CPDR
DELAYED_RELEASE_CAPSULE | ORAL | 1 refills | Status: DC
Start: 2022-10-28 — End: 2023-04-07

## 2022-10-28 NOTE — Patient Instructions (Addendum)
Iron-minimum of 365 mg daily  Vitamin D 1000 to 2000 units daily(IF I don't prescribe vitamin D)  Drink 80 to 100 ounces water daily  Anemia  Anemia is a condition in which there are not enough red blood cells or hemoglobin in the blood. Hemoglobin is a substance in red blood cells that carries oxygen. When you do not have enough red blood cells or hemoglobin (are anemic), your body cannot get enough oxygen, and your organs may not work properly. As a result, you may feel very tired or have other problems. What are the causes? Common causes of anemia include: Excessive bleeding. Anemia can be caused by excessive bleeding inside or outside the body, including bleeding from the intestines or from heavy menstrual periods in females. Poor nutrition. Long-lasting (chronic) kidney, thyroid, and liver disease. Bone marrow disorders, spleen problems, and blood disorders. Cancer and treatments for cancer. Human immunodeficiency virus (HIV) and acquired immunodeficiency syndrome (AIDS). Infections, medicines, and autoimmune disorders that destroy red blood cells. What are the signs or symptoms? Symptoms of this condition include: Minor weakness. Dizziness. Headache, or difficulties concentrating and sleeping. Heartbeats that feel irregular or faster than normal (palpitations). Shortness of breath, especially with exercise. Pale skin, lips, and nails, or cold hands and feet. Upset stomach (indigestion) and nausea. Symptoms may occur suddenly or develop slowly. If your anemia is mild, you may not have symptoms. How is this diagnosed? This condition is diagnosed based on blood tests, your medical history, and a physical exam. In some cases, a test may be needed in which cells are removed from the soft tissue inside of a bone and looked at under a microscope (bone marrow biopsy). Your health care provider may also check your stool (feces) for blood and may do more testing to look for the cause of  your bleeding. Other tests may include: Imaging tests, such as a CT scan or MRI. A procedure to see inside your esophagus and stomach (endoscopy). The esophagus is the part of the body that moves food from your mouth to your stomach. A procedure to see inside your colon and rectum (colonoscopy). How is this treated? Treatment for this condition depends on the cause. If you continue to lose a lot of blood, you may need to be treated at a hospital. Treatment may include: Taking supplements of iron, vitamin B12, or folic acid. Taking a hormone medicine (erythropoietin) that can help to stimulate red blood cell growth. Receiving donated blood through an IV (blood transfusion). This may be needed if you lose a lot of blood. Making changes to your diet. Having surgery to remove your spleen. Follow these instructions at home: Take over-the-counter and prescription medicines only as told by your health care provider. Take supplements only as told by your health care provider. Follow any diet instructions that you were given by your health care provider. Keep all follow-up visits. Your health care provider will want to recheck your blood tests. Contact a health care provider if: You develop new bleeding anywhere in the body. You are very weak. Get help right away if: You are short of breath. You have pain in your abdomen or chest. You are dizzy or feel faint. You have trouble concentrating. You have bloody stools, black stools, or tarry stools. You vomit repeatedly or you vomit up blood. These symptoms may be an emergency. Get help right away. Call 911. Do not wait to see if the symptoms will go away. Do not drive yourself to the hospital. Summary  Anemia is a condition in which you do not have enough red blood cells or enough of a substance in your red blood cells that carries oxygen. Symptoms may occur suddenly or develop slowly. If your anemia is mild, you may not have symptoms. This  condition is diagnosed with blood tests, a medical history, and a physical exam. Other tests may be needed. Treatment for this condition depends on the cause of the anemia. This information is not intended to replace advice given to you by your health care provider. Make sure you discuss any questions you have with your health care provider. Document Revised: 03/31/2021 Document Reviewed: 03/31/2021 Elsevier Patient Education  2024 [employer].

## 2022-10-28 NOTE — Progress Notes (Signed)
Patient ID: Kimberly [name], female   DOB: 1990/01/28, 32 y.o.   MRN: [id number]   Kimberly [name], is a 32 y.o. female  [id number]  [id number]  DOB - 12/31/1990  Chief Complaint  Patient presents with   Medication Refill   mold testing       Subjective:   Kimberly [name] is a 32 y.o. female here today for a follow up visit with thyroid and anemia.  She is being followed by gyn for menorrhagia and has been diagnosed with PCOS and endometriosis.  She has a hard time taking iron by mouth and is not currently taking any.  She would like PNV Rx to help with supplementation.  C/o extreme fatigue.  Gets tired doing day to day tasks.  No PICA reported.  No CP/tinnitus.  Currently taking levothyroxine.  Wants vitamin D checked.    No problems updated.  ALLERGIES: Allergies  Allergen Reactions   Cold & Cough Daytime [Acetaminophen-Dm] Shortness Of Breath    Could not tolerate- orange liquid, may have contained Pseudoephedrine- "I could not tolerate"   Zofran [Ondansetron] Other (See Comments)    Muscle Spasms   Abilify [Aripiprazole] Other (See Comments)    Lethargy, unable to function   Amoxicillin-Pot Clavulanate Other (See Comments)    Stomach pains Has patient had a PCN reaction causing immediate rash, facial/tongue/throat swelling, SOB or lightheadedness with hypotension: No Has patient had a PCN reaction causing severe rash involving mucus membranes or skin necrosis: No Has patient had a PCN reaction that required hospitalization No Has patient had a PCN reaction occurring within the last 10 years: Yes If all of the above answers are "NO", then may proceed with Cephalosporin use.    Latex Swelling and Other (See Comments)    Burning of skin, too   Phenergan [Promethazine]     'gave me twitches' 'not able to sit still'   Tape Swelling    Swelling, burning of skin .    Zicam Cold Remedy [Homeopathic Products] Nausea And Vomiting   Keflex [Cephalexin] Rash and Other (See  Comments)    Rash--able to take w/ Benadryl   Vicodin [Hydrocodone-Acetaminophen] Hives and Rash    Per pt- makes her very sleepy, feels like she isn't getting her breath. States she does not have throat/tongue swelling or anaphylaxis.    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 Authorizing Provider  levothyroxine (SYNTHROID) 100 MCG tablet Take 1 tablet (100 mcg total) by mouth daily. 10/08/22  Yes Hoy Register, MD  Prenatal Vit-Fe Fumarate-FA (MULTIVITAMIN-PRENATAL) 27-0.8 MG TABS tablet Take 1 tablet by mouth daily at  12 noon. 10/28/22  Yes Walaa Carel M, PA-C  FLUoxetine (PROZAC) 20 MG capsule TAKE 1 CAPSULE(20 MG) BY MOUTH DAILYTAKE 1 CAPSULE(20 MG) BY MOUTH DAILY 10/28/22   Sharon Seller, Marzella Schlein, PA-C  meclizine (ANTIVERT) 25 MG tablet TAKE 1 TABLET(25 MG) BY MOUTH THREE TIMES DAILY AS NEEDED FOR DIZZINESS 10/28/22   Anders Simmonds, PA-C  omeprazole (PRILOSEC) 20 MG capsule TAKE 1 CAPSULE(20 MG) BY MOUTH DAILY 10/28/22   Georgian Co M, PA-C  propranolol ER (INDERAL LA) 60 MG 24 hr capsule Take 1 capsule (60 mg total) by mouth daily. 10/28/22   Naithan Delage, Marzella Schlein, PA-C    ROS: Neg HEENT Neg resp Neg cardiac Neg  GI Neg GU Neg MS Neg psych Neg neuro  Objective:   Vitals:   10/28/22 1126  BP: 113/80  Pulse: 84  SpO2: 98%  Weight: 278 lb 3.2 oz (126.2 kg)   Exam General appearance : Awake, alert, not in any distress. Speech Clear. Not toxic looking HEENT: Atraumatic and Normocephalic Neck: Supple, no JVD. No cervical lymphadenopathy.  Chest: Good air entry bilaterally, CTAB.  No rales/rhonchi/wheezing CVS: S1 S2 regular, no murmurs.  Extremities: B/L Lower Ext shows no edema, both legs are warm to touch Neurology: Awake alert, and oriented X 3, CN II-XII intact, Non focal Skin: No Rash  Data Review Lab Results  Component Value Date   HGBA1C 5.1 09/01/2021   HGBA1C 4.9 06/28/2019   HGBA1C 4.8 01/18/2019    Assessment & Plan   1. Primary hypertension controlled - propranolol ER (INDERAL LA) 60 MG 24 hr capsule; Take 1 capsule (60 mg total) by mouth daily.  Dispense: 90 capsule; Refill: 1  2. Gastroesophageal reflux disease without esophagitis - omeprazole (PRILOSEC) 20 MG capsule; TAKE 1 CAPSULE(20 MG) BY MOUTH DAILY  Dispense: 90 capsule; Refill: 1  3. Anxiety and depression Controlled on meds - Vitamin D, 25-hydroxy - FLUoxetine (PROZAC) 20 MG capsule; TAKE 1 CAPSULE(20 MG) BY MOUTH DAILYTAKE 1 CAPSULE(20 MG) BY MOUTH DAILY  Dispense: 270 capsule; Refill: 1  4. Vertigo Not new - Vitamin D, 25-hydroxy - meclizine (ANTIVERT) 25 MG tablet; TAKE 1 TABLET(25 MG) BY MOUTH THREE TIMES DAILY AS NEEDED FOR DIZZINESS  Dispense: 90 tablet; Refill: 0  5. Other iron deficiency anemia Ok to take PNV.  Will likely also need Rx iron.   - CBC with Differential - Iron, TIBC and Ferritin Panel  6. 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 Fatigue, unspecified type Increase water intake - Vitamin D, 25-hydroxy - Prenatal Vit-Fe Fumarate-FA (MULTIVITAMIN-PRENATAL) 27-0.8  MG TABS tablet; Take 1 tablet by mouth daily at 12 noon.  Dispense: 90 tablet; Refill: 1  8. Hyperglycemia Work at a goal of eliminating sugary drinks, candy, desserts, sweets, refined sugars, processed foods, and white carbohydrates.   - Hemoglobin A1c    Return in about 4 months (around 02/28/2023) for PCP for chronic conditions([place]).  The patient was given clear instructions to go to ER or return to medical center if symptoms don't improve, worsen or new problems develop. The patient verbalized understanding. The patient was told to call to get lab results if they haven't heard anything in the next week.      Georgian Co, PA-C [facility] and [facility]ness [place], [place] [phone]   10/28/2022, 1:05 PM

## 2022-10-29 ENCOUNTER — Other Ambulatory Visit: Payer: Self-pay | Admitting: Physician Assistant

## 2022-10-29 DIAGNOSIS — E559 Vitamin D deficiency, unspecified: Secondary | ICD-10-CM

## 2022-10-29 DIAGNOSIS — E071 Dyshormogenetic goiter: Secondary | ICD-10-CM

## 2022-10-29 DIAGNOSIS — D508 Other iron deficiency anemias: Secondary | ICD-10-CM

## 2022-10-29 LAB — THYROID PANEL WITH TSH
Free Thyroxine Index: 2.5 (ref 1.2–4.9)
T3 Uptake Ratio: 24 % (ref 24–39)
T4, Total: 10.4 ug/dL (ref 4.5–12.0)
TSH: 7.43 u[IU]/mL — ABNORMAL HIGH (ref 0.450–4.500)

## 2022-10-29 LAB — CBC WITH DIFFERENTIAL/PLATELET
Basophils Absolute: 0 10*3/uL (ref 0.0–0.2)
Basos: 0 %
EOS (ABSOLUTE): 0.2 10*3/uL (ref 0.0–0.4)
Eos: 2 %
Hematocrit: 35.5 % (ref 34.0–46.6)
Hemoglobin: 10 g/dL — ABNORMAL LOW (ref 11.1–15.9)
Immature Grans (Abs): 0 10*3/uL (ref 0.0–0.1)
Immature Granulocytes: 0 %
Lymphocytes Absolute: 2.8 10*3/uL (ref 0.7–3.1)
Lymphs: 26 %
MCH: 21.4 pg — ABNORMAL LOW (ref 26.6–33.0)
MCHC: 28.2 g/dL — ABNORMAL LOW (ref 31.5–35.7)
MCV: 76 fL — ABNORMAL LOW (ref 79–97)
Monocytes Absolute: 0.5 10*3/uL (ref 0.1–0.9)
Monocytes: 5 %
Neutrophils Absolute: 7.2 10*3/uL — ABNORMAL HIGH (ref 1.4–7.0)
Neutrophils: 67 %
Platelets: 490 10*3/uL — ABNORMAL HIGH (ref 150–450)
RBC: 4.68 x10E6/uL (ref 3.77–5.28)
RDW: 15.2 % (ref 11.7–15.4)
WBC: 10.7 10*3/uL (ref 3.4–10.8)

## 2022-10-29 LAB — IRON,TIBC AND FERRITIN PANEL
Ferritin: 9 ng/mL — ABNORMAL LOW (ref 15–150)
Iron Saturation: 9 % — CL (ref 15–55)
Iron: 36 ug/dL (ref 27–159)
Total Iron Binding Capacity: 403 ug/dL (ref 250–450)
UIBC: 367 ug/dL (ref 131–425)

## 2022-10-29 LAB — HEMOGLOBIN A1C
Est. average glucose Bld gHb Est-mCnc: 103 mg/dL
Hgb A1c MFr Bld: 5.2 % (ref 4.8–5.6)

## 2022-10-29 LAB — VITAMIN D 25 HYDROXY (VIT D DEFICIENCY, FRACTURES): Vit D, 25-Hydroxy: 13.6 ng/mL — ABNORMAL LOW (ref 30.0–100.0)

## 2022-10-29 MED ORDER — LEVOTHYROXINE SODIUM 125 MCG PO TABS
125.0000 ug | ORAL_TABLET | Freq: Every day | ORAL | 3 refills | Status: DC
Start: 2022-10-29 — End: 2022-12-10

## 2022-10-29 MED ORDER — IRON (FERROUS SULFATE) 325 (65 FE) MG PO TABS
325.0000 mg | ORAL_TABLET | Freq: Every day | ORAL | 1 refills | Status: DC
Start: 2022-10-29 — End: 2023-05-10

## 2022-10-29 MED ORDER — VITAMIN D (ERGOCALCIFEROL) 1.25 MG (50000 UNIT) PO CAPS
50000.0000 [IU] | ORAL_CAPSULE | ORAL | 0 refills | Status: DC
Start: 2022-10-29 — End: 2023-02-11

## 2022-10-30 ENCOUNTER — Telehealth: Payer: Self-pay

## 2022-10-30 NOTE — Telephone Encounter (Signed)
Pt was called and vm was left, Information has been sent to nurse pool.

## 2022-10-30 NOTE — Telephone Encounter (Signed)
-----   Message from Georgian Co sent at 10/29/2022  8:41 AM EDT ----- Your vitamin D is very low.  This can contribute to muscle aches, anxiety, fatigue, and depression.  I have sent a prescription to the pharmacy for you to take once a week.  We will recheck this level in 3-4 months.  Your iron stores and hemoglobin are low.  Take the iron prescription I'm sending you and you can take the prenatal vitamin.  Your thyroid dose needs to be increased a little so I sen you a new prescription of .  Stop the prescription.  All of this is contributing to the fatigue.  No diabetes.  Thanks, Georgian Co, PA-C

## 2022-11-03 ENCOUNTER — Encounter: Payer: Self-pay | Admitting: Family Medicine

## 2022-11-03 ENCOUNTER — Ambulatory Visit: Payer: Medicaid Other | Attending: Family Medicine | Admitting: Family Medicine

## 2022-11-03 DIAGNOSIS — N920 Excessive and frequent menstruation with regular cycle: Secondary | ICD-10-CM | POA: Insufficient documentation

## 2022-11-03 DIAGNOSIS — N809 Endometriosis, unspecified: Secondary | ICD-10-CM

## 2022-11-03 DIAGNOSIS — N921 Excessive and frequent menstruation with irregular cycle: Secondary | ICD-10-CM | POA: Diagnosis not present

## 2022-11-03 NOTE — Progress Notes (Signed)
Virtual Visit via Telephone Note  I connected with Kimberly [name], on 11/03/2022 at 1:46 PM by telephone and verified that I am speaking with the correct person using two identifiers.   Consent: I discussed the limitations, risks, security and privacy concerns of performing an evaluation and management service by telephone and the availability of in person appointments. I also discussed with the patient that there may be a patient responsible charge related to this service. The patient expressed understanding and agreed to proceed.   Location of Patient: Home  Location of Provider: Clinic   Persons participating in Telemedicine visit: Kimberly [name] Dr. Alvis Lemmings     History of Present Illness: Kimberly [name] is a 32 y.o. year old female with  a history of anxiety and depression, hypothyroidism, chronic low back pain, vertigo, migraines, PCOS, endometriosis.  Discussed the use of AI scribe software for clinical note transcription with the patient, who gave verbal consent to proceed.   The patient, with a history of endometriosis and polycystic ovary syndrome (PCOS), presents for a discussion about the use of incontinence products during her menstrual cycle. She reports using incontinence briefs and bed pads during her cycle, which she finds helpful for managing her menstrual flow and cramps. She notes that she cannot use tampons anymore. Her menstrual cycles are irregular. She also reports a history of heavy menstrual flow leading to hospitalization and low iron levels. She is currently under the care of a gynecologist, Dr. Eugenie Filler, who is monitoring her menstrual cycles and iron levels.    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    Iron deficiency anemia    Migraines    "weekly" (11/16/2012)   Oligomenorrhea 03/01/2011   Ovarian cyst    PONV (postoperative nausea and vomiting)    Pregnancy induced hypertension    Schizophrenia (HCC)    "moderate" (11/16/2012)   Scoliosis    Allergies  Allergen Reactions   Cold & Cough Daytime [Acetaminophen-Dm] Shortness Of Breath    Could not tolerate- orange liquid, may have contained Pseudoephedrine- "I could not tolerate"   Zofran [Ondansetron] Other (See Comments)    Muscle Spasms   Abilify [Aripiprazole] Other (See Comments)    Lethargy, unable to function   Amoxicillin-Pot Clavulanate Other (See Comments)    Stomach pains Has patient had a PCN reaction causing immediate rash, facial/tongue/throat swelling, SOB or lightheadedness with hypotension: No Has patient had a PCN reaction causing severe rash involving mucus membranes or skin necrosis: No Has patient had a PCN reaction that required hospitalization No Has patient had a PCN reaction occurring within the last 10 years: Yes If all of the above answers are "NO", then may proceed with Cephalosporin use.    Latex Swelling and Other (See Comments)    Burning of skin, too   Phenergan [Promethazine]     'gave me twitches' 'not able to sit still'   Tape Swelling    Swelling, burning of skin .    Zicam Cold Remedy [Homeopathic Products]  Nausea And Vomiting   Keflex [Cephalexin] Rash and Other (See Comments)    Rash--able to take w/ Benadryl   Vicodin [Hydrocodone-Acetaminophen] Hives and Rash    Per pt- makes her very sleepy, feels like she isn't getting her breath. States she does not have throat/tongue swelling or anaphylaxis.    Current Outpatient Medications on File Prior to Visit  Medication Sig Dispense Refill   FLUoxetine (PROZAC) 20 MG capsule TAKE 1  CAPSULE(20 MG) BY MOUTH DAILYTAKE 1 CAPSULE(20 MG) BY MOUTH DAILY 270 capsule 1   Iron, Ferrous Sulfate, 325 (65 Fe) MG TABS Take 325 mg by mouth daily. 90 tablet 1   levothyroxine (SYNTHROID) 125 MCG tablet Take 1 tablet (125 mcg total) by mouth daily. 90 tablet 3   meclizine (ANTIVERT) 25 MG tablet TAKE 1 TABLET(25 MG) BY MOUTH THREE TIMES DAILY AS NEEDED FOR DIZZINESS 90 tablet 0   omeprazole (PRILOSEC) 20 MG capsule TAKE 1 CAPSULE(20 MG) BY MOUTH DAILY 90 capsule 1   Prenatal Vit-Fe Fumarate-FA (MULTIVITAMIN-PRENATAL) 27-0.8 MG TABS tablet Take 1 tablet by mouth daily at 12 noon. 90 tablet 1   propranolol ER (INDERAL LA) 60 MG 24 hr capsule Take 1 capsule (60 mg total) by mouth daily. 90 capsule 1   Vitamin D, Ergocalciferol, (DRISDOL) 1.25 MG (50000 UNIT) CAPS capsule Take 1 capsule (50,000 Units total) by mouth every 7 (seven) days. 13 capsule 0   No current facility-administered medications on file prior to visit.    ROS: See HPI  Observations/Objective: Awake, alert, oriented x3 Not in acute distress Normal mood      Latest Ref Rng & Units 04/28/2022   11:06 AM 04/09/2022    8:32 AM 11/02/2021    9:30 AM  CMP  Glucose 70 - 99 mg/dL 97  161  92   BUN 6 - 20 mg/dL 9  8  8    Creatinine 0.44 - 1.00 mg/dL 0.96  0.45  4.09   Sodium 135 - 145 mmol/L 136  140  137   Potassium 3.5 - 5.1 mmol/L 3.6  3.7  3.5   Chloride 98 - 111 mmol/L 105  107  104   CO2 22 - 32 mmol/L 20  22  25    Calcium 8.9 - 10.3 mg/dL 8.5  9.2  9.1   Total Protein 6.5 - 8.1 g/dL 7.3     Total Bilirubin 0.3 - 1.2 mg/dL 0.2     Alkaline Phos 38 - 126 U/L 111     AST 15 - 41 U/L 20     ALT 0 - 44 U/L 21       Lipid Panel     Component Value Date/Time   CHOL 178 06/08/2016 0936   TRIG 237 (H) 06/08/2016 0936   HDL 41 06/08/2016 0936   CHOLHDL 4.3 06/08/2016 0936   CHOLHDL 4.4 08/07/2013 1141   VLDL 29 08/07/2013 1141   LDLCALC 90 06/08/2016 0936   LABVLDL 47 (H) 06/08/2016 0936    Lab Results   Component Value Date   HGBA1C 5.2 10/28/2022    Assessment and Plan:     Endometriosis/menorrhagia Irregular menstrual cycles with heavy bleeding managed with incontinence briefs and bed pads. Under the care of GYN-Dr. Eugenie Filler for monitoring and annual check-ups. No recent hospitalizations for heavy bleeding. Recent uterine sample testing was negative. -Continue current management plan with Gynecologist. -Order incontinence briefs, gloves, and under pads for menstrual cycle management.     Follow Up Instructions: Keep previously  scheduled appointment   I discussed the assessment and treatment plan with the patient. The patient was provided an opportunity to ask questions and all were answered. The patient agreed with the plan and demonstrated an understanding of the instructions.   The patient was advised to call back or seek an in-person evaluation if the symptoms worsen or if the condition fails to improve as anticipated.     I provided 11 minutes total of non-face-to-face time during this encounter.   Hoy Register, MD, FAAFP. [facility] [place], [place] [phone]   11/03/2022, 1:46 PM

## 2022-11-11 DIAGNOSIS — R32 Unspecified urinary incontinence: Secondary | ICD-10-CM | POA: Diagnosis not present

## 2022-11-16 ENCOUNTER — Other Ambulatory Visit: Payer: Self-pay | Admitting: Family Medicine

## 2022-11-16 ENCOUNTER — Encounter: Payer: Self-pay | Admitting: Family Medicine

## 2022-11-16 MED ORDER — DOXYCYCLINE HYCLATE 100 MG PO TABS: 100.0000 mg | ORAL_TABLET | Freq: Two times a day (BID) | ORAL | 0 refills | Status: DC

## 2022-11-20 DIAGNOSIS — Z419 Encounter for procedure for purposes other than remedying health state, unspecified: Secondary | ICD-10-CM | POA: Diagnosis not present

## 2022-12-10 ENCOUNTER — Encounter: Payer: Self-pay | Admitting: Family Medicine

## 2022-12-10 ENCOUNTER — Other Ambulatory Visit: Payer: Self-pay | Admitting: *Deleted

## 2022-12-10 DIAGNOSIS — E071 Dyshormogenetic goiter: Secondary | ICD-10-CM

## 2022-12-10 MED ORDER — LEVOTHYROXINE SODIUM 125 MCG PO TABS
125.0000 ug | ORAL_TABLET | Freq: Every day | ORAL | 0 refills | Status: DC
Start: 2022-12-10 — End: 2023-02-11

## 2022-12-20 DIAGNOSIS — Z419 Encounter for procedure for purposes other than remedying health state, unspecified: Secondary | ICD-10-CM | POA: Diagnosis not present

## 2022-12-23 ENCOUNTER — Telehealth: Payer: Self-pay | Admitting: Family Medicine

## 2022-12-23 ENCOUNTER — Encounter: Payer: Self-pay | Admitting: Family Medicine

## 2022-12-23 NOTE — Telephone Encounter (Signed)
Call placed to patient unable to reach message left on VM.   

## 2022-12-23 NOTE — Telephone Encounter (Signed)
Patient called said she woke up and could hardly move due to her herniated disc. She needs a note to excuse her from jury duty that she had this morning. Please f/u with patient as she said she can come pick up the note or she can email to her at [id number]

## 2022-12-24 NOTE — Telephone Encounter (Signed)
Call placed to patient unable to reach message left on VM.   

## 2022-12-27 DIAGNOSIS — R32 Unspecified urinary incontinence: Secondary | ICD-10-CM | POA: Diagnosis not present

## 2023-01-03 ENCOUNTER — Encounter: Payer: Self-pay | Admitting: Family Medicine

## 2023-01-20 DIAGNOSIS — Z419 Encounter for procedure for purposes other than remedying health state, unspecified: Secondary | ICD-10-CM | POA: Diagnosis not present

## 2023-01-20 DIAGNOSIS — R32 Unspecified urinary incontinence: Secondary | ICD-10-CM | POA: Diagnosis not present

## 2023-01-30 ENCOUNTER — Encounter: Payer: Self-pay | Admitting: Family Medicine

## 2023-02-01 ENCOUNTER — Other Ambulatory Visit: Payer: Self-pay | Admitting: Family Medicine

## 2023-02-01 DIAGNOSIS — E071 Dyshormogenetic goiter: Secondary | ICD-10-CM

## 2023-02-11 ENCOUNTER — Other Ambulatory Visit: Payer: Self-pay | Admitting: Family Medicine

## 2023-02-11 ENCOUNTER — Other Ambulatory Visit: Payer: Self-pay | Admitting: Physician Assistant

## 2023-02-11 DIAGNOSIS — E071 Dyshormogenetic goiter: Secondary | ICD-10-CM

## 2023-02-11 DIAGNOSIS — E559 Vitamin D deficiency, unspecified: Secondary | ICD-10-CM

## 2023-02-11 MED ORDER — LEVOTHYROXINE SODIUM 125 MCG PO TABS: 125.0000 ug | ORAL_TABLET | Freq: Every day | ORAL | 0 refills | Status: DC

## 2023-02-12 NOTE — Telephone Encounter (Signed)
Refilled 02/11/23 # 30. Requested Prescriptions  Refused Prescriptions Disp Refills   levothyroxine (SYNTHROID) 125 MCG tablet [Pharmacy Med Name: LEVOTHYROXINE 0.125MG  ( ) TAB] 90 tablet     Sig: TAKE 1 TABLET(125 MCG) BY MOUTH DAILY     Endocrinology:  Hypothyroid Agents Failed - 02/12/2023 10:40 AM      Failed - TSH in normal range and within 360 days    TSH  Date Value Ref Range Status  10/28/2022 7.430 (H) 0.450 - 4.500 uIU/mL Final         Passed - Valid encounter within last 12 months    Recent Outpatient Visits           3 months ago Endometriosis   [place] [facility] [place]. [facility] Hoy Register, MD   3 months ago Other iron deficiency anemia   [place] [facility] [place] [facility] Dept Of [place]. [facility], Marylene Land M, [place]   10 months ago Primary hypertension   [place] [facility] [place] - [facility] Dept Of [place]. [facility] Claiborne Rigg, NP   1 year ago Abnormal uterine bleeding due to endometrial polyp   [place] [facility] - [facility] Of [place]. [facility] Hoy Register, MD   1 year ago History of diet controlled gestational diabetes mellitus (GDM)    [facility] - A Dept Of . [facility] Hoy Register, MD       Future Appointments             In 2 weeks Hoy Register, MD [facility] - A Dept Of [place]ideg[place]. [facility]

## 2023-02-20 DIAGNOSIS — Z419 Encounter for procedure for purposes other than remedying health state, unspecified: Secondary | ICD-10-CM | POA: Diagnosis not present

## 2023-02-21 DIAGNOSIS — R32 Unspecified urinary incontinence: Secondary | ICD-10-CM | POA: Diagnosis not present

## 2023-03-03 ENCOUNTER — Ambulatory Visit: Payer: Medicaid Other | Admitting: Family Medicine

## 2023-03-05 ENCOUNTER — Encounter: Payer: Self-pay | Admitting: Family Medicine

## 2023-03-05 ENCOUNTER — Telehealth: Payer: Self-pay

## 2023-03-05 DIAGNOSIS — E071 Dyshormogenetic goiter: Secondary | ICD-10-CM

## 2023-03-05 MED ORDER — LEVOTHYROXINE SODIUM 125 MCG PO TABS: 125.0000 ug | ORAL_TABLET | Freq: Every day | ORAL | 0 refills | Status: DC

## 2023-03-05 MED ORDER — DOXYCYCLINE HYCLATE 100 MG PO TABS: 100.0000 mg | ORAL_TABLET | Freq: Two times a day (BID) | ORAL | 0 refills | Status: DC

## 2023-03-05 NOTE — Telephone Encounter (Signed)
Patient has appointment scheduled for 04/07/2023 and she is requesting refills on Thyroid medication and Doxycycline

## 2023-03-05 NOTE — Telephone Encounter (Signed)
Done

## 2023-03-05 NOTE — Addendum Note (Signed)
Addended by: Hoy Register on: 03/05/2023 05:57 PM   Modules accepted: Orders

## 2023-03-06 ENCOUNTER — Other Ambulatory Visit: Payer: Self-pay | Admitting: Family Medicine

## 2023-03-06 DIAGNOSIS — E071 Dyshormogenetic goiter: Secondary | ICD-10-CM

## 2023-03-08 NOTE — Telephone Encounter (Signed)
Vm has been left informing patient that medications has been filled.

## 2023-03-08 NOTE — Telephone Encounter (Signed)
Requested Prescriptions  Refused Prescriptions Disp Refills   levothyroxine (SYNTHROID) 125 MCG tablet [Pharmacy Med Name: LEVOTHYROXINE 0.125MG  ( ) TAB] 90 tablet     Sig: TAKE 1 TABLET(125 MCG) BY MOUTH DAILY     Endocrinology:  Hypothyroid Agents Failed - 03/08/2023 11:51 AM      Failed - TSH in normal range and within 360 days    TSH  Date Value Ref Range Status  10/28/2022 7.430 (H) 0.450 - 4.500 uIU/mL Final         Passed - Valid encounter within last 12 months    Recent Outpatient Visits           4 months ago Endometriosis   [place] [facility] Dept Of [place]. [facility] Hoy Register, MD   4 months ago Other iron deficiency anemia   [place] [facility] [place] - [facility] Dept Of [place]. [facility], Marylene Land M, [place]   11 months ago Primary hypertension   [place] [facility] Dept Of [place]. [facility] Claiborne Rigg, NP   1 year ago Abnormal uterine bleeding due to endometrial polyp   [place] [facility] - [facility] Dept Of [place]. [facility] Hoy Register, MD   1 year ago History of diet controlled gestational diabetes mellitus (GDM)   [place] [facility] - A Dept Of [place]. [facility] Hoy Register, MD       Future Appointments             In 1 month Hoy Register, MD [facility] [place] - A Dept Of [place]. [facility]

## 2023-03-19 ENCOUNTER — Other Ambulatory Visit: Payer: Self-pay | Admitting: Family Medicine

## 2023-03-19 ENCOUNTER — Encounter: Payer: Self-pay | Admitting: Family Medicine

## 2023-03-19 DIAGNOSIS — E559 Vitamin D deficiency, unspecified: Secondary | ICD-10-CM

## 2023-03-19 DIAGNOSIS — R21 Rash and other nonspecific skin eruption: Secondary | ICD-10-CM

## 2023-03-20 DIAGNOSIS — Z419 Encounter for procedure for purposes other than remedying health state, unspecified: Secondary | ICD-10-CM | POA: Diagnosis not present

## 2023-03-25 DIAGNOSIS — R32 Unspecified urinary incontinence: Secondary | ICD-10-CM | POA: Diagnosis not present

## 2023-04-07 ENCOUNTER — Encounter: Payer: Self-pay | Admitting: Family Medicine

## 2023-04-07 ENCOUNTER — Ambulatory Visit: Payer: Medicaid Other | Attending: Family Medicine | Admitting: Family Medicine

## 2023-04-07 VITALS — BP 137/79 | HR 86 | Ht 64.0 in | Wt 285.2 lb

## 2023-04-07 DIAGNOSIS — K219 Gastro-esophageal reflux disease without esophagitis: Secondary | ICD-10-CM

## 2023-04-07 DIAGNOSIS — F419 Anxiety disorder, unspecified: Secondary | ICD-10-CM | POA: Diagnosis not present

## 2023-04-07 DIAGNOSIS — I10 Essential (primary) hypertension: Secondary | ICD-10-CM

## 2023-04-07 DIAGNOSIS — F32A Depression, unspecified: Secondary | ICD-10-CM | POA: Diagnosis not present

## 2023-04-07 DIAGNOSIS — E071 Dyshormogenetic goiter: Secondary | ICD-10-CM

## 2023-04-07 DIAGNOSIS — R4189 Other symptoms and signs involving cognitive functions and awareness: Secondary | ICD-10-CM

## 2023-04-07 DIAGNOSIS — R21 Rash and other nonspecific skin eruption: Secondary | ICD-10-CM

## 2023-04-07 MED ORDER — PROPRANOLOL HCL ER 60 MG PO CP24
60.0000 mg | ORAL_CAPSULE | Freq: Every day | ORAL | 1 refills | Status: DC
Start: 2023-04-07 — End: 2023-10-11

## 2023-04-07 MED ORDER — FLUOXETINE HCL 20 MG PO CAPS: ORAL_CAPSULE | ORAL | 1 refills | Status: DC

## 2023-04-07 MED ORDER — METRONIDAZOLE 1 % EX GEL: Freq: Every day | CUTANEOUS | 0 refills | Status: DC

## 2023-04-07 MED ORDER — OMEPRAZOLE 20 MG PO CPDR: DELAYED_RELEASE_CAPSULE | ORAL | 1 refills | Status: DC

## 2023-04-07 NOTE — Progress Notes (Signed)
 Subjective:  Patient ID: Kimberly [name], female    DOB: 09/18/1990  Age: 33 y.o. MRN: [id number]  CC: Medical Management of Chronic Issues (Forgetful/Redness in face/Medication refills//)     Discussed the use of AI scribe software for clinical note transcription with the patient, who gave verbal consent to proceed.  History of Present Illness The patient, with a history of anxiety and depression, hypothyroidism, chronic low back pain, vertigo, migraines, PCOS, endometriosis, hypertension, presents with a new complaint of persistent facial redness and heat, particularly noticeable at night. She has noticed this for about two months and it is not affected by changes in shower temperature. She also reports an intolerance to sun exposure, describing a sensation of overheating and feeling faint, which leads to fatigue and a need to sleep.  In addition to these symptoms, she reports joint pain in her hands and wrists, and numbness in her hands, particularly when lying down in certain positions. She also reports intermittent hair loss.  The patient also describes a recent onset of 'brain fog', where she experiences moments of confusion and forgetfulness, struggling to respond in conversations. This has been occurring for about two weeks and is not associated with any new medications or recent illnesses.  The patient is currently not working due to back issues and a change in her previous employment. She has a young child and is trying to manage her blood pressure without medication. She endorses adherence to propranolol for her blood pressure and she is doing well on levothyroxine.  Reflux is controlled on her medication.    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    Iron deficiency anemia    Migraines    "weekly" (11/16/2012)   Oligomenorrhea 03/01/2011   Ovarian cyst    PONV (postoperative nausea and vomiting)    Pregnancy induced hypertension    Schizophrenia (HCC)    "moderate" (11/16/2012)   Scoliosis     Past Surgical History:  Procedure Laterality Date   CESAREAN SECTION N/A 08/02/2019   Procedure: CESAREAN SECTION;  Surgeon: Kathrynn Running, MD;  [facility];  Service: Obstetrics;  Laterality: N/A;   HYSTEROSCOPY WITH D & C N/A 04/14/2022   Procedure: DILATATION AND CURETTAGE;  Surgeon: Hermina Staggers, MD;  Location: [facility] OR;  Service: Gynecology;  Laterality: N/A;   LAPAROSCOPIC LYSIS OF ADHESIONS N/A 04/14/2022   Procedure: LAPAROSCOPIC LYSIS OF ADHESIONS;  Surgeon: Hermina Staggers, MD;  Location: [facility] OR;  Service: Gynecology;  Laterality: N/A;   LAPAROSCOPIC OVARIAN CYSTECTOMY Left 02/11/2016   Procedure: LAPAROSCOPIC OVARIAN CYSTECTOMY;  Surgeon: Allie Bossier, MD;  Location: [facility];  Service: Gynecology;  Laterality: Left;   LAPAROSCOPIC OVARIAN CYSTECTOMY Left 04/14/2022   Procedure: LAPAROSCOPIC OVARIAN CYSTECTOMY;  Surgeon: Hermina Staggers, MD;  [facility]: [facility];  Service: Gynecology;  Laterality: Left;   LAPAROSCOPY N/A 04/14/2022   Procedure: LAPAROSCOPY DIAGNOSTIC;  Surgeon: Hermina Staggers, MD;  Location: [facility] OR;  Service: Gynecology;  Laterality: N/A;   UPPER GI ENDOSCOPY  ~  2006; ~2008    Family History  Problem Relation Age of Onset   Diabetes Mother    Hypertension Mother    Vision loss Mother    Hypertension Father    Hyperlipidemia Father    Thyroid disease Maternal Grandmother     Social History   Socioeconomic History   Marital status: Single    Spouse name: Not on file   Number of children: 2   Years of education: Not on file    Highest education level: 12th grade  Occupational History    Comment: at vets office  Tobacco Use   Smoking status: Never   Smokeless tobacco: Never  Vaping Use   Vaping status: Never Used  Substance and Sexual Activity   Alcohol use: Never   Drug use: No   Sexual activity: Yes    Birth control/protection: None  Other Topics Concern   Not on file  Social History Narrative   Lives with children   Caffeine- maybe 1 soda a day   Social Drivers of Health   Financial Resource Strain: Medium Risk (04/06/2023)   Overall Financial Resource Strain (CARDIA)    Difficulty of Paying Living Expenses: Somewhat hard  Food Insecurity: Food Insecurity Present (04/06/2023)   Hunger Vital Sign    Worried About Running Out of Food in the Last Year: Sometimes true    Ran Out of Food in the Last Year: Sometimes true  Transportation Needs: No Transportation Needs (04/06/2023)   PRAPARE - Administrator, Civil Service (Medical): No    Lack of Transportation (Non-Medical): No  Physical Activity: Insufficiently Active (04/06/2023)   Exercise Vital Sign    Days of Exercise per Week: 3 days    Minutes of Exercise per Session: 20 min  Stress: Stress Concern Present (04/06/2023)   [employer] of Occupational Health - Occupational Stress Questionnaire    Feeling of Stress : Rather much  Social Connections: Socially Isolated (04/06/2023)   Social Connection and Isolation Panel [NHANES]    Frequency of Communication with Friends and Family: Three times a week    Frequency of Social Gatherings with Friends and Family: More than three times a week    Attends Religious Services: Never    Database administrator or Organizations: No    Attends Engineer, structural: Not on file    Marital Status: Never married    Allergies  Allergen Reactions   Cold & Cough Daytime [Acetaminophen-Dm] Shortness Of Breath    Could not tolerate- orange liquid, may have contained Pseudoephedrine- "I  could not tolerate"   Zofran [Ondansetron] Other (See Comments)    Muscle Spasms   Abilify [Aripiprazole] Other (See Comments)    Lethargy, unable to function   Amoxicillin-Pot Clavulanate Other (See Comments)    Stomach pains Has patient had a PCN reaction causing immediate rash, facial/tongue/throat swelling, SOB or lightheadedness with hypotension: No Has patient had a PCN reaction causing severe rash involving mucus membranes or skin necrosis: No Has patient had a PCN reaction that required hospitalization No Has patient had a PCN reaction occurring within the last 10 years: Yes If all of the above answers are "NO", then may proceed with Cephalosporin use.    Latex Swelling and Other (See Comments)    Burning of skin, too   Phenergan [Promethazine]     'gave me twitches' 'not able to sit still'   Tape Swelling    Swelling, burning of skin .    Zicam  Cold Remedy [Homeopathic Products] Nausea And Vomiting   Keflex [Cephalexin] Rash and Other (See Comments)    Rash--able to take w/ Benadryl   Vicodin [Hydrocodone-Acetaminophen] Hives and Rash    Per pt- makes her very sleepy, feels like she isn't getting her breath. States she does not have throat/tongue swelling or anaphylaxis.    Outpatient Medications Prior to Visit  Medication Sig Dispense Refill   Iron, Ferrous Sulfate, 325 (65 Fe) MG TABS Take 325 mg by mouth daily. 90 tablet 1   levothyroxine (SYNTHROID) 125 MCG tablet Take 1 tablet (125 mcg total) by mouth daily. 30 tablet 0   meclizine (ANTIVERT) 25 MG tablet TAKE 1 TABLET(25 MG) BY MOUTH THREE TIMES DAILY AS NEEDED FOR DIZZINESS 90 tablet 0   FLUoxetine (PROZAC) 20 MG capsule TAKE 1 CAPSULE(20 MG) BY MOUTH DAILYTAKE 1 CAPSULE(20 MG) BY MOUTH DAILY 270 capsule 1   omeprazole (PRILOSEC) 20 MG capsule TAKE 1 CAPSULE(20 MG) BY MOUTH DAILY 90 capsule 1   propranolol ER (INDERAL LA) 60 MG 24 hr capsule Take 1 capsule (60 mg total) by mouth daily. 90 capsule 1   doxycycline  (VIBRA-TABS) 100 MG tablet Take 1 tablet (100 mg total) by mouth 2 (two) times daily. (Patient not taking: Reported on 04/07/2023) 20 tablet 0   Prenatal Vit-Fe Fumarate-FA (MULTIVITAMIN-PRENATAL) 27-0.8 MG TABS tablet Take 1 tablet by mouth daily at 12 noon. (Patient not taking: Reported on 04/07/2023) 90 tablet 1   Vitamin D, Ergocalciferol, (DRISDOL) 1.25 MG (50000 UNIT) CAPS capsule TAKE 1 CAPSULE BY MOUTH EVERY 7 DAYS (Patient not taking: Reported on 04/07/2023) 4 capsule 0   No facility-administered medications prior to visit.     ROS Review of Systems  Constitutional:  Negative for activity change and appetite change.  HENT:  Negative for sinus pressure and sore throat.   Respiratory:  Negative for chest tightness, shortness of breath and wheezing.   Cardiovascular:  Negative for chest pain and palpitations.  Gastrointestinal:  Negative for abdominal distention, abdominal pain and constipation.  Genitourinary: Negative.   Musculoskeletal:  Positive for back pain.  Skin:  Positive for rash.  Psychiatric/Behavioral:  Negative for behavioral problems and dysphoric mood.     Objective:  BP 137/79   Pulse 86   Ht 5\' 4"  (1.626 m)   Wt 285 lb 3.2 oz (129.4 kg)   SpO2 97%   BMI 48.95 kg/m      04/07/2023    2:31 PM 10/28/2022   11:26 AM 05/11/2022    1:59 PM  BP/Weight  Systolic BP 137 113 121  Diastolic BP 79 80 78  Wt. (Lbs) 285.2 278.2 265.4  BMI 48.95 kg/m2 47.75 kg/m2 45.56 kg/m2      Physical Exam Constitutional:      Appearance: She is well-developed.  Cardiovascular:     Rate and Rhythm: Normal rate.     Heart sounds: Normal heart sounds. No murmur heard. Pulmonary:     Effort: Pulmonary effort is normal.     Breath sounds: Normal breath sounds. No wheezing or rales.  Chest:     Chest wall: No tenderness.  Abdominal:     General: Bowel sounds are normal. There is no distension.     Palpations: Abdomen is soft. There is no mass.     Tenderness: There is no  abdominal tenderness.  Musculoskeletal:        General: Normal range of motion.     Right lower leg: No edema.  Left lower leg: No edema.  Skin:    Comments: Erythematous Maller rash which also includes forehead area between eyes with some scaling  Neurological:     Mental Status: She is alert and oriented to person, place, and time.  Psychiatric:        Mood and Affect: Mood normal.        Latest Ref Rng & Units 04/28/2022   11:06 AM 04/09/2022    8:32 AM 11/02/2021    9:30 AM  CMP  Glucose 70 - 99 mg/dL 97  027  92   BUN 6 - 20 mg/dL 9  8  8    Creatinine 0.44 - 1.00 mg/dL 2.53  6.64  4.03   Sodium 135 - 145 mmol/L 136  140  137   Potassium 3.5 - 5.1 mmol/L 3.6  3.7  3.5   Chloride 98 - 111 mmol/L 105  107  104   CO2 22 - 32 mmol/L 20  22  25    Calcium 8.9 - 10.3 mg/dL 8.5  9.2  9.1   Total Protein 6.5 - 8.1 g/dL 7.3     Total Bilirubin 0.3 - 1.2 mg/dL 0.2     Alkaline Phos 38 - 126 U/L 111     AST 15 - 41 U/L 20     ALT 0 - 44 U/L 21       Lipid Panel     Component Value Date/Time   CHOL 178 06/08/2016 0936   TRIG 237 (H) 06/08/2016 0936   HDL 41 06/08/2016 0936   CHOLHDL 4.3 06/08/2016 0936   CHOLHDL 4.4 08/07/2013 1141   VLDL 29 08/07/2013 1141   LDLCALC 90 06/08/2016 0936    CBC    Component Value Date/Time   WBC 10.7 10/28/2022 1207   WBC 11.9 (H) 04/29/2022 0306   RBC 4.68 10/28/2022 1207   RBC 3.57 (L) 04/29/2022 0306   HGB 10.0 (L) 10/28/2022 1207   HCT 35.5 10/28/2022 1207   PLT 490 (H) 10/28/2022 1207   MCV 76 (L) 10/28/2022 1207   MCH 21.4 (L) 10/28/2022 1207   MCH 22.7 (L) 04/29/2022 0306   MCHC 28.2 (L) 10/28/2022 1207   MCHC 30.1 04/29/2022 0306   RDW 15.2 10/28/2022 1207   LYMPHSABS 2.8 10/28/2022 1207   MONOABS 0.9 04/28/2022 1106   EOSABS 0.2 10/28/2022 1207   BASOSABS 0.0 10/28/2022 1207    Lab Results  Component Value Date   HGBA1C 5.2 10/28/2022    Lab Results  Component Value Date   TSH 7.430 (H) 10/28/2022        Assessment & Plan Malar rash Persistent facial rash with redness and heat, possible lupus or rosacea. Symptoms suggest autoimmune etiology. - Order blood work for autoimmune markers. - Prescribe Metrogel (metronidazole) topical for cheeks. - Refer to dermatologist if blood work does not indicate autoimmune disease. - Refer to rheumatologist if blood work indicates autoimmune disease.  Brain fog Intermittent brain fog, possible thyroid dysfunction or nonspecific factors. - Order thyroid function tests. - Check CBC, electrolytes, liver function, and kidney function. - Advise keeping a diary of symptoms associated with brain fog.  Hypothyroidism Managed with levothyroxine. Current symptoms may relate to thyroid function. -Last TSH was elevated did - Check TSH levels. - Continue current levothyroxine dosage until lab results are reviewed.  Anxiety and depression Managed with fluoxetine. No new symptoms. - Refill fluoxetine prescription.  Hypertension Managed with propranolol. Attempting lifestyle modifications. - Refill propranolol prescription.  Choice of  propranolol due to history of migraines. -Counseled on blood pressure goal of less than 130/80, low-sodium, DASH diet, medication compliance, 150 minutes of moderate intensity exercise per week. Discussed medication compliance, adverse effects.   Gastroesophageal reflux disease (GERD) Managed with omeprazole. No new symptoms. - Refill omeprazole prescription.  Chronic back pain Contributing to inability to work.  Migraines Reduced frequency and intensity. Currently on prophylaxis with propranolol  Follow-up Plan to review lab results and determine appropriate specialist referral based on findings. - Review lab results and communicate findings via MyChart. - Determine referral to dermatologist or rheumatologist based on lab results.      Meds ordered this encounter  Medications   metroNIDAZOLE (METROGEL) 1 % gel     Sig: Apply topically daily. To rash on face    Dispense:  45 g    Refill:  0   FLUoxetine (PROZAC) 20 MG capsule    Sig: TAKE 1 CAPSULE(20 MG) BY MOUTH DAILYTAKE 1 CAPSULE(20 MG) BY MOUTH DAILY    Dispense:  270 capsule    Refill:  1   omeprazole (PRILOSEC) 20 MG capsule    Sig: TAKE 1 CAPSULE(20 MG) BY MOUTH DAILY    Dispense:  90 capsule    Refill:  1   propranolol ER (INDERAL LA) 60 MG 24 hr capsule    Sig: Take 1 capsule (60 mg total) by mouth daily.    Dispense:  90 capsule    Refill:  1    Follow-up: Return in about 6 months (around 10/08/2023) for Chronic medical conditions.       Hoy Register, MD, FAAFP. [facility] [place], [place] [phone]   04/07/2023, 4:16 PM

## 2023-04-07 NOTE — Patient Instructions (Signed)
 VISIT SUMMARY:  During today's visit, we discussed your new symptoms of persistent facial redness and heat, intolerance to sun exposure, joint pain, numbness in your hands, intermittent hair loss, and recent 'brain fog'. We also reviewed your ongoing conditions including anxiety, depression, acid reflux, hypertension, hypothyroidism, chronic back pain, and migraines.  YOUR PLAN:  -FACIAL RASH: Your persistent facial redness and heat may be due to an autoimmune condition like lupus or a skin condition like rosacea. We will do blood work to check for autoimmune markers. In the meantime, I have prescribed Metrogel (metronidazole) topical for your cheeks. Depending on the blood work results, you may need to see a dermatologist or a rheumatologist.  -BRAIN FOG: Your intermittent brain fog could be related to thyroid issues or other nonspecific factors. We will check your thyroid function, complete blood count, electrolytes, liver, and kidney function. Please keep a diary of your symptoms to help [place] understand any patterns.  -HYPOTHYROIDISM: Hypothyroidism is when your thyroid gland doesn't produce enough thyroid hormone. We will check your TSH levels to ensure your current dosage of levothyroxine is appropriate. Continue taking your current dosage until we review the lab results.  -ANXIETY AND DEPRESSION: Your anxiety and depression are currently managed with fluoxetine. There are no new symptoms, and I have refilled your prescription.  -HYPERTENSION: Your high blood pressure is being managed with propranolol, and you are also trying lifestyle changes. I have refilled your prescription.  -GASTROESOPHAGEAL REFLUX DISEASE (GERD): Your acid reflux is being managed with omeprazole, and there are no new symptoms. I have refilled your prescription.  -CHRONIC BACK PAIN: Your chronic back pain is contributing to your inability to work. We will continue to monitor and manage this condition.  -MIGRAINES: Your  migraines have reduced in frequency and intensity, which is a positive sign. We will continue with the current management plan.  INSTRUCTIONS:  We will review your lab results and communicate the findings via MyChart. Based on the results, we will determine if you need a referral to a dermatologist or a rheumatologist.

## 2023-04-08 ENCOUNTER — Other Ambulatory Visit: Payer: Self-pay | Admitting: Family Medicine

## 2023-04-08 ENCOUNTER — Encounter: Payer: Self-pay | Admitting: Family Medicine

## 2023-04-08 DIAGNOSIS — E071 Dyshormogenetic goiter: Secondary | ICD-10-CM

## 2023-04-08 MED ORDER — LEVOTHYROXINE SODIUM 137 MCG PO TABS: 137.0000 ug | ORAL_TABLET | Freq: Every day | ORAL | 1 refills | Status: DC

## 2023-04-09 ENCOUNTER — Other Ambulatory Visit: Payer: Self-pay | Admitting: Family Medicine

## 2023-04-09 MED ORDER — ERGOCALCIFEROL 1.25 MG (50000 UT) PO CAPS: 50000.0000 [IU] | ORAL_CAPSULE | ORAL | 1 refills | Status: DC

## 2023-04-14 ENCOUNTER — Other Ambulatory Visit: Payer: Self-pay | Admitting: Family Medicine

## 2023-04-14 DIAGNOSIS — R21 Rash and other nonspecific skin eruption: Secondary | ICD-10-CM

## 2023-04-14 LAB — CBC WITH DIFFERENTIAL/PLATELET
Basophils Absolute: 0 10*3/uL (ref 0.0–0.2)
Basos: 0 %
EOS (ABSOLUTE): 0.3 10*3/uL (ref 0.0–0.4)
Eos: 3 %
Hematocrit: 39.4 % (ref 34.0–46.6)
Hemoglobin: 12.8 g/dL (ref 11.1–15.9)
Immature Grans (Abs): 0 10*3/uL (ref 0.0–0.1)
Immature Granulocytes: 0 %
Lymphocytes Absolute: 2.8 10*3/uL (ref 0.7–3.1)
Lymphs: 24 %
MCH: 27.6 pg (ref 26.6–33.0)
MCHC: 32.5 g/dL (ref 31.5–35.7)
MCV: 85 fL (ref 79–97)
Monocytes Absolute: 0.7 10*3/uL (ref 0.1–0.9)
Monocytes: 6 %
Neutrophils Absolute: 7.6 10*3/uL — ABNORMAL HIGH (ref 1.4–7.0)
Neutrophils: 67 %
Platelets: 411 10*3/uL (ref 150–450)
RBC: 4.64 x10E6/uL (ref 3.77–5.28)
RDW: 13.5 % (ref 11.7–15.4)
WBC: 11.4 10*3/uL — ABNORMAL HIGH (ref 3.4–10.8)

## 2023-04-14 LAB — CMP14+EGFR
ALT: 18 IU/L (ref 0–32)
AST: 21 IU/L (ref 0–40)
Albumin: 4.2 g/dL (ref 3.9–4.9)
Alkaline Phosphatase: 97 IU/L (ref 44–121)
BUN/Creatinine Ratio: 16 (ref 9–23)
BUN: 8 mg/dL (ref 6–20)
Bilirubin Total: 0.9 mg/dL (ref 0.0–1.2)
CO2: 22 mmol/L (ref 20–29)
Calcium: 9.2 mg/dL (ref 8.7–10.2)
Chloride: 104 mmol/L (ref 96–106)
Creatinine, Ser: 0.49 mg/dL — ABNORMAL LOW (ref 0.57–1.00)
Globulin, Total: 2.9 g/dL (ref 1.5–4.5)
Glucose: 103 mg/dL — ABNORMAL HIGH (ref 70–99)
Potassium: 4.3 mmol/L (ref 3.5–5.2)
Sodium: 140 mmol/L (ref 134–144)
Total Protein: 7.1 g/dL (ref 6.0–8.5)
eGFR: 129 mL/min/{1.73_m2} (ref >59)

## 2023-04-14 LAB — ANA,IFA RA DIAG PNL W/RFLX TIT/PATN
ANA Titer 1: NEGATIVE
Cyclic Citrullin Peptide Ab: 5 U (ref 0–19)
Rheumatoid fact SerPl-aCnc: <10 [IU]/mL (ref <14.0)

## 2023-04-14 LAB — T4, FREE: Free T4: 1.21 ng/dL (ref 0.82–1.77)

## 2023-04-14 LAB — ANTI-SMITH ANTIBODY: ENA SM Ab Ser-aCnc: <0.2 AI (ref 0.0–0.9)

## 2023-04-14 LAB — C-REACTIVE PROTEIN: CRP: 11 mg/L — ABNORMAL HIGH (ref 0–10)

## 2023-04-14 LAB — ANTI-DNA ANTIBODY, DOUBLE-STRANDED: dsDNA Ab: <1 [IU]/mL (ref 0–9)

## 2023-04-14 LAB — TSH: TSH: 4.68 u[IU]/mL — ABNORMAL HIGH (ref 0.450–4.500)

## 2023-04-14 LAB — T3: T3, Total: 139 ng/dL (ref 71–180)

## 2023-04-14 LAB — SEDIMENTATION RATE: Sed Rate: 40 mm/h — ABNORMAL HIGH (ref 0–32)

## 2023-04-15 ENCOUNTER — Other Ambulatory Visit: Payer: Self-pay | Admitting: Family Medicine

## 2023-04-15 DIAGNOSIS — M545 Low back pain, unspecified: Secondary | ICD-10-CM

## 2023-04-20 ENCOUNTER — Ambulatory Visit: Payer: Medicaid Other | Admitting: Family Medicine

## 2023-04-25 ENCOUNTER — Encounter: Payer: Self-pay | Admitting: Obstetrics and Gynecology

## 2023-04-25 ENCOUNTER — Encounter: Payer: Self-pay | Admitting: Family Medicine

## 2023-04-25 DIAGNOSIS — R32 Unspecified urinary incontinence: Secondary | ICD-10-CM | POA: Diagnosis not present

## 2023-04-26 ENCOUNTER — Encounter: Payer: Self-pay | Admitting: Family Medicine

## 2023-04-28 ENCOUNTER — Other Ambulatory Visit: Payer: Self-pay | Admitting: Family Medicine

## 2023-04-28 MED ORDER — CLINDAMYCIN HCL 300 MG PO CAPS
600.0000 mg | ORAL_CAPSULE | Freq: Once | ORAL | 0 refills | Status: AC
Start: 2023-04-28 — End: 2023-04-28

## 2023-04-29 ENCOUNTER — Encounter: Payer: Self-pay | Admitting: Family Medicine

## 2023-05-01 DIAGNOSIS — Z419 Encounter for procedure for purposes other than remedying health state, unspecified: Secondary | ICD-10-CM | POA: Diagnosis not present

## 2023-05-10 ENCOUNTER — Other Ambulatory Visit: Payer: Self-pay | Admitting: Physician Assistant

## 2023-05-10 DIAGNOSIS — D508 Other iron deficiency anemias: Secondary | ICD-10-CM

## 2023-05-19 DIAGNOSIS — M549 Dorsalgia, unspecified: Secondary | ICD-10-CM | POA: Diagnosis not present

## 2023-05-19 DIAGNOSIS — Z131 Encounter for screening for diabetes mellitus: Secondary | ICD-10-CM | POA: Diagnosis not present

## 2023-05-19 DIAGNOSIS — Z1159 Encounter for screening for other viral diseases: Secondary | ICD-10-CM | POA: Diagnosis not present

## 2023-05-19 DIAGNOSIS — G8929 Other chronic pain: Secondary | ICD-10-CM | POA: Diagnosis not present

## 2023-05-19 DIAGNOSIS — M129 Arthropathy, unspecified: Secondary | ICD-10-CM | POA: Diagnosis not present

## 2023-05-19 DIAGNOSIS — Z79899 Other long term (current) drug therapy: Secondary | ICD-10-CM | POA: Diagnosis not present

## 2023-05-26 DIAGNOSIS — R32 Unspecified urinary incontinence: Secondary | ICD-10-CM | POA: Diagnosis not present

## 2023-05-31 ENCOUNTER — Encounter: Payer: Self-pay | Admitting: Obstetrics and Gynecology

## 2023-05-31 ENCOUNTER — Encounter: Payer: Self-pay | Admitting: Family Medicine

## 2023-05-31 DIAGNOSIS — Z419 Encounter for procedure for purposes other than remedying health state, unspecified: Secondary | ICD-10-CM | POA: Diagnosis not present

## 2023-06-15 ENCOUNTER — Encounter: Payer: Self-pay | Admitting: Physician Assistant

## 2023-06-16 ENCOUNTER — Other Ambulatory Visit: Payer: Self-pay | Admitting: Physician Assistant

## 2023-06-16 DIAGNOSIS — Z3200 Encounter for pregnancy test, result unknown: Secondary | ICD-10-CM | POA: Diagnosis not present

## 2023-06-16 DIAGNOSIS — Z79899 Other long term (current) drug therapy: Secondary | ICD-10-CM | POA: Diagnosis not present

## 2023-06-16 DIAGNOSIS — Z6841 Body Mass Index (BMI) 40.0 and over, adult: Secondary | ICD-10-CM | POA: Diagnosis not present

## 2023-06-16 DIAGNOSIS — M549 Dorsalgia, unspecified: Secondary | ICD-10-CM | POA: Diagnosis not present

## 2023-06-16 MED ORDER — METRONIDAZOLE 1 % EX GEL: Freq: Every day | CUTANEOUS | 0 refills | Status: AC

## 2023-06-18 DIAGNOSIS — Z79899 Other long term (current) drug therapy: Secondary | ICD-10-CM | POA: Diagnosis not present

## 2023-06-26 DIAGNOSIS — R32 Unspecified urinary incontinence: Secondary | ICD-10-CM | POA: Diagnosis not present

## 2023-07-01 ENCOUNTER — Ambulatory Visit: Admitting: Obstetrics and Gynecology

## 2023-07-01 DIAGNOSIS — Z419 Encounter for procedure for purposes other than remedying health state, unspecified: Secondary | ICD-10-CM | POA: Diagnosis not present

## 2023-07-02 ENCOUNTER — Encounter: Payer: Self-pay | Admitting: Family Medicine

## 2023-07-03 ENCOUNTER — Encounter: Payer: Self-pay | Admitting: Family Medicine

## 2023-07-14 DIAGNOSIS — M549 Dorsalgia, unspecified: Secondary | ICD-10-CM | POA: Diagnosis not present

## 2023-07-14 DIAGNOSIS — Z79899 Other long term (current) drug therapy: Secondary | ICD-10-CM | POA: Diagnosis not present

## 2023-07-14 DIAGNOSIS — Z3200 Encounter for pregnancy test, result unknown: Secondary | ICD-10-CM | POA: Diagnosis not present

## 2023-07-14 DIAGNOSIS — Z6841 Body Mass Index (BMI) 40.0 and over, adult: Secondary | ICD-10-CM | POA: Diagnosis not present

## 2023-07-15 ENCOUNTER — Other Ambulatory Visit: Payer: Self-pay | Admitting: Family Medicine

## 2023-07-15 ENCOUNTER — Encounter: Payer: Self-pay | Admitting: Family Medicine

## 2023-07-15 DIAGNOSIS — R42 Dizziness and giddiness: Secondary | ICD-10-CM

## 2023-07-15 MED ORDER — MECLIZINE HCL 25 MG PO TABS
ORAL_TABLET | ORAL | 11 refills | Status: AC
Start: 2023-07-15 — End: ?

## 2023-07-27 DIAGNOSIS — R32 Unspecified urinary incontinence: Secondary | ICD-10-CM | POA: Diagnosis not present

## 2023-07-29 NOTE — Telephone Encounter (Signed)
 Patient has picked up the form for handicap placard.

## 2023-07-31 DIAGNOSIS — Z419 Encounter for procedure for purposes other than remedying health state, unspecified: Secondary | ICD-10-CM | POA: Diagnosis not present

## 2023-08-11 ENCOUNTER — Ambulatory Visit: Admitting: Obstetrics and Gynecology

## 2023-08-11 DIAGNOSIS — R32 Unspecified urinary incontinence: Secondary | ICD-10-CM | POA: Diagnosis not present

## 2023-08-11 NOTE — Progress Notes (Deleted)
    GYNECOLOGY VISIT  Patient name: Kimberly [name] MRN [id number]  Date of birth: 1990/02/09 Chief Complaint:   No chief complaint on file.   History:  Kimberly [name] is a 33 y.o. [phone] being seen today for ***.    Past Medical History:  Diagnosis Date   Acanthosis nigricans, acquired 03/01/2011   Anxiety    Asthma    grew out of it (11/16/2012)   Chronic back pain    Complication of anesthesia    takes a lot to put me to sleep; woke up completely mid endoscopy (11/16/2012)   Depression    Dysrhythmia    ST (11/16/2012)   Gastric reflux    GERD (gastroesophageal reflux disease)    Hypothyroidism due to defective thyroid  hormonogenesis    Iron  deficiency anemia    Migraines    weekly (11/16/2012)   Oligomenorrhea 03/01/2011   Ovarian cyst    PONV (postoperative nausea and vomiting)    Pregnancy induced hypertension    Schizophrenia (HCC)    moderate (11/16/2012)   Scoliosis     Past Surgical History:  Procedure Laterality Date   CESAREAN SECTION N/A 08/02/2019   Procedure: CESAREAN SECTION;  Surgeon: Kandis Devaughn Sayres, MD;  [facility]: [facility];  Service: Obstetrics;  Laterality: N/A;   HYSTEROSCOPY WITH D & C N/A 04/14/2022   Procedure: DILATATION AND CURETTAGE;  Surgeon: Lorence Ozell CROME, MD;  Location: [facility] OR;  Service: Gynecology;  Laterality: N/A;   LAPAROSCOPIC LYSIS OF ADHESIONS N/A 04/14/2022   Procedure: LAPAROSCOPIC LYSIS OF ADHESIONS;  Surgeon: Lorence Ozell CROME, MD;  Location: [facility] OR;  Service: Gynecology;  Laterality: N/A;   LAPAROSCOPIC OVARIAN CYSTECTOMY Left 02/11/2016   Procedure: LAPAROSCOPIC OVARIAN CYSTECTOMY;  Surgeon: Harland JAYSON Birkenhead, MD;  Location: [facility] ORS;  Service: Gynecology;  Laterality: Left;   LAPAROSCOPIC OVARIAN CYSTECTOMY Left 04/14/2022   Procedure: LAPAROSCOPIC OVARIAN CYSTECTOMY;  Surgeon: Lorence Ozell CROME, MD;  Location: [facility] OR;  Service: Gynecology;  Laterality: Left;   LAPAROSCOPY N/A 04/14/2022   Procedure: LAPAROSCOPY DIAGNOSTIC;   Surgeon: Lorence Ozell CROME, MD;  Location: [facility] OR;  Service: Gynecology;  Laterality: N/A;   UPPER GI ENDOSCOPY  ~ 2006; ~2008    The following portions of the patient's history were reviewed and updated as appropriate: allergies, current medications, past family history, past medical history, past social history, past surgical history and problem list.   Health Maintenance:   Last pap ***. Results were: {Pap findings:25134}. H/O abnormal pap: {yes/yes***/no:23866} Last mammogram: ***. Results were: {normal, abnormal, n/a:23837}. Family h/o breast cancer: {yes***/no:23838}   Review of Systems:  {Ros - complete:30496} Comprehensive review of systems was otherwise negative.   Objective:  Physical Exam There were no vitals taken for this visit.   Physical Exam   Labs and Imaging No results found.     Assessment & Plan:   There are no diagnoses linked to this encounter.   *** Routine preventative health maintenance measures emphasized.  Carter Quarry, MD [facility] for [facility], [facility]

## 2023-08-12 DIAGNOSIS — R29818 Other symptoms and signs involving the nervous system: Secondary | ICD-10-CM | POA: Diagnosis not present

## 2023-08-12 DIAGNOSIS — Z79899 Other long term (current) drug therapy: Secondary | ICD-10-CM | POA: Diagnosis not present

## 2023-08-12 DIAGNOSIS — M549 Dorsalgia, unspecified: Secondary | ICD-10-CM | POA: Diagnosis not present

## 2023-08-12 DIAGNOSIS — Z3200 Encounter for pregnancy test, result unknown: Secondary | ICD-10-CM | POA: Diagnosis not present

## 2023-08-12 DIAGNOSIS — Z6841 Body Mass Index (BMI) 40.0 and over, adult: Secondary | ICD-10-CM | POA: Diagnosis not present

## 2023-08-17 DIAGNOSIS — Z79899 Other long term (current) drug therapy: Secondary | ICD-10-CM | POA: Diagnosis not present

## 2023-08-27 DIAGNOSIS — R32 Unspecified urinary incontinence: Secondary | ICD-10-CM | POA: Diagnosis not present

## 2023-08-31 DIAGNOSIS — Z419 Encounter for procedure for purposes other than remedying health state, unspecified: Secondary | ICD-10-CM | POA: Diagnosis not present

## 2023-09-07 ENCOUNTER — Encounter: Payer: Self-pay | Admitting: Family Medicine

## 2023-09-09 DIAGNOSIS — M549 Dorsalgia, unspecified: Secondary | ICD-10-CM | POA: Diagnosis not present

## 2023-09-09 DIAGNOSIS — Z3200 Encounter for pregnancy test, result unknown: Secondary | ICD-10-CM | POA: Diagnosis not present

## 2023-09-09 DIAGNOSIS — Z79899 Other long term (current) drug therapy: Secondary | ICD-10-CM | POA: Diagnosis not present

## 2023-09-09 DIAGNOSIS — Z6841 Body Mass Index (BMI) 40.0 and over, adult: Secondary | ICD-10-CM | POA: Diagnosis not present

## 2023-09-10 ENCOUNTER — Encounter: Payer: Self-pay | Admitting: Family Medicine

## 2023-09-11 DIAGNOSIS — L03115 Cellulitis of right lower limb: Secondary | ICD-10-CM | POA: Diagnosis not present

## 2023-09-11 DIAGNOSIS — L089 Local infection of the skin and subcutaneous tissue, unspecified: Secondary | ICD-10-CM | POA: Diagnosis not present

## 2023-09-12 ENCOUNTER — Encounter: Payer: Self-pay | Admitting: Family Medicine

## 2023-09-13 ENCOUNTER — Other Ambulatory Visit: Payer: Self-pay | Admitting: Family Medicine

## 2023-09-13 ENCOUNTER — Ambulatory Visit: Payer: Self-pay

## 2023-09-13 DIAGNOSIS — Z79899 Other long term (current) drug therapy: Secondary | ICD-10-CM | POA: Diagnosis not present

## 2023-09-13 DIAGNOSIS — L03113 Cellulitis of right upper limb: Secondary | ICD-10-CM | POA: Diagnosis not present

## 2023-09-13 DIAGNOSIS — I10 Essential (primary) hypertension: Secondary | ICD-10-CM | POA: Diagnosis not present

## 2023-09-13 MED ORDER — CLINDAMYCIN HCL 300 MG PO CAPS: 300.0000 mg | ORAL_CAPSULE | Freq: Four times a day (QID) | ORAL | 0 refills | Status: AC

## 2023-09-13 NOTE — Telephone Encounter (Signed)
 FYI Only or Action Required?: Action required by provider: Heading to ED, requesting call back with further recommendations given staph infection and reacting to doxycycline  (hx allergy  to it) prescribed for it by UC.  Patient was last seen in primary care on 04/07/2023 by Newlin, Enobong, MD.  Called Nurse Triage reporting Hypertension, Medication Reaction, prior headache, prior weakness, and prior blurred vision.  Symptoms began several days ago.  Interventions attempted: Prescription medications: propanolol and Rest, hydration, or home remedies.  Symptoms are: currently not present, sporadic and severe.  Triage Disposition: Go to ED Now (Notify PCP)  Patient/caregiver understands and will follow disposition?: Yes     Copied from CRM [id number]. Topic: Clinical - Red Word Triage >> Sep 13, 2023 11:10 AM Kimberly [name] wrote: [facility] that prompted transfer to Nurse Triage: Patient went to urgent care and has a staph infection and was prescribed doxycline and an ointment. States she had allergic reaction to doxycline, caused her BP to rise last she took was 138/94.   Reason for Disposition  [1] Systolic BP >= 160 OR Diastolic >= 100 AND [2] cardiac (e.g., breathing difficulty, chest pain) or neurologic symptoms (e.g., new-onset blurred or double vision, unsteady gait)    Last night had diastolic >90 plus headache, blurred vision, and weakness after taking med she is allergic to  Answer Assessment - Initial Assessment Questions Santina to UC on Saturday, staph infection Staph infection all over body, doxycycline  and bactroban Allergic to doxycycline , don't know why was removed from chart so didn't think anything of it, no diabetes, high BP takes med 3 doses of it, BP higher but brushed it off, got really bad last night, almost went to hospital, said to contact PCP to get on something else  1. BLOOD PRESSURE: What is your blood pressure? Did you take at least two measurements 5 minutes  apart?     Took last night 128/84 Highest reading was 138/94 Taking on phone per nurse request 132/82 right now  3. HOW: How did you take your blood pressure? (e.g., automatic home BP monitor, visiting nurse)     At home BP monitor 4. HISTORY: Do you have a history of high blood pressure?     yes 5. MEDICINES: Are you taking any medicines for blood pressure? Have you missed any doses recently?     propanolol 6. OTHER SYMPTOMS: Do you have any symptoms? (e.g., blurred vision, chest pain, difficulty breathing, headache, weakness)     Blurred vision last night, no chest pain or SOB, headache last night, felt weaker than usual last night 7. PREGNANCY: Is there any chance you are pregnant? When was your last menstrual period?     No  Last time took doxycycline  last night around 6 pm   Advised ED for sporadic and severe symptoms, pt states she will go to ED. Sending message to PCP for further recommendations with meds, etc.  Protocols used: Blood Pressure - High-A-AH

## 2023-09-13 NOTE — Telephone Encounter (Signed)
 Patient has communicated with PCP via MyChart regarding matter.

## 2023-09-22 ENCOUNTER — Telehealth: Payer: Self-pay | Admitting: Family Medicine

## 2023-09-22 ENCOUNTER — Ambulatory Visit: Admitting: Obstetrics and Gynecology

## 2023-09-22 NOTE — Telephone Encounter (Signed)
 Called patient and left VM to call office back ASAP about appt with Dr.Ajewole.

## 2023-09-27 DIAGNOSIS — R32 Unspecified urinary incontinence: Secondary | ICD-10-CM | POA: Diagnosis not present

## 2023-09-28 ENCOUNTER — Other Ambulatory Visit: Payer: Self-pay | Admitting: Family Medicine

## 2023-09-28 DIAGNOSIS — K219 Gastro-esophageal reflux disease without esophagitis: Secondary | ICD-10-CM

## 2023-09-30 NOTE — Telephone Encounter (Signed)
 Requested Prescriptions  Pending Prescriptions Disp Refills   omeprazole  (PRILOSEC) 20 MG capsule [Pharmacy Med Name: OMEPRAZOLE  20MG  CAPSULES] 90 capsule 1    Sig: TAKE 1 CAPSULE(20 MG) BY MOUTH DAILY     Gastroenterology: Proton Pump Inhibitors Passed - 09/30/2023  8:42 AM      Passed - Valid encounter within last 12 months    Recent Outpatient Visits           5 months ago Malar rash   [facility] - A Dept Of [place]. [facility] Delbert Clam, MD   11 months ago Endometriosis   [facility] - A Dept Of [place]. [facility] Delbert Clam, MD   11 months ago Other iron  deficiency anemia   [facility] - A Dept Of [place]. [facility], Jon [place], [place]   1 year ago Primary hypertension   [facility] - A Dept Of [place]. [facility] Theotis Haze ORN, NP   1 year ago Abnormal uterine bleeding due to endometrial polyp   [facility] - A Dept Of . [facility] Delbert Clam, MD       Future Appointments             In 1 week Delbert Clam, MD [facility] [place] - A Dept Of [place]. [facility], [place]

## 2023-10-01 DIAGNOSIS — Z419 Encounter for procedure for purposes other than remedying health state, unspecified: Secondary | ICD-10-CM | POA: Diagnosis not present

## 2023-10-11 ENCOUNTER — Ambulatory Visit: Attending: Family Medicine | Admitting: Family Medicine

## 2023-10-11 VITALS — BP 105/72 | HR 89 | Ht 64.0 in | Wt 270.4 lb

## 2023-10-11 DIAGNOSIS — F419 Anxiety disorder, unspecified: Secondary | ICD-10-CM | POA: Diagnosis not present

## 2023-10-11 DIAGNOSIS — F32A Depression, unspecified: Secondary | ICD-10-CM

## 2023-10-11 DIAGNOSIS — F411 Generalized anxiety disorder: Secondary | ICD-10-CM

## 2023-10-11 DIAGNOSIS — L0292 Furuncle, unspecified: Secondary | ICD-10-CM | POA: Diagnosis not present

## 2023-10-11 DIAGNOSIS — E071 Dyshormogenetic goiter: Secondary | ICD-10-CM | POA: Diagnosis not present

## 2023-10-11 DIAGNOSIS — E559 Vitamin D deficiency, unspecified: Secondary | ICD-10-CM | POA: Diagnosis not present

## 2023-10-11 DIAGNOSIS — G8929 Other chronic pain: Secondary | ICD-10-CM | POA: Diagnosis not present

## 2023-10-11 DIAGNOSIS — I10 Essential (primary) hypertension: Secondary | ICD-10-CM

## 2023-10-11 DIAGNOSIS — M545 Low back pain, unspecified: Secondary | ICD-10-CM | POA: Diagnosis not present

## 2023-10-11 DIAGNOSIS — R5383 Other fatigue: Secondary | ICD-10-CM

## 2023-10-11 MED ORDER — PROPRANOLOL HCL ER 60 MG PO CP24: 60.0000 mg | ORAL_CAPSULE | Freq: Every day | ORAL | 1 refills | Status: DC

## 2023-10-11 MED ORDER — FLUOXETINE HCL 40 MG PO CAPS: 40.0000 mg | ORAL_CAPSULE | Freq: Every day | ORAL | 1 refills | Status: AC

## 2023-10-11 MED ORDER — CHLORHEXIDINE GLUCONATE 4 % EX SOLN: Freq: Every day | CUTANEOUS | 1 refills | Status: AC | PRN

## 2023-10-11 NOTE — Progress Notes (Signed)
 Subjective:  Patient ID: Kimberly [name], female    DOB: [birth year]  Age: 33 y.o. MRN: [id number]  CC: Medical Management of Chronic Issues     Discussed the use of AI scribe software for clinical note transcription with the patient, who gave verbal consent to proceed.  History of Present Illness Kimberly [name] is a 33 year old female with a history of anxiety and depression, hypothyroidism, chronic low back pain, vertigo, migraines, PCOS, endometriosis, hypertension who presents for management of chronic back pain and anxiety.  Chronic back pain originates from the middle back, extending to the lower back and down the legs. Oxycodone  5 mg up to four times daily provides partial relief, especially in the morning and during activities with her children. She is awaiting a physical therapy appointment in October. Previous medications included tramadol  and Vicodin, which were not tolerated due to an allergy .  Pain management is by [facility] medical.  Anxiety has increased, with feelings of being frantic and difficulty speaking to people. She continues to take Prozac  and propranolol  daily. She has not engaged in therapy or counseling due to a preference for in-person sessions, which have been difficult to find post-COVID.  She experiences heavy menstrual bleeding and significant cramping due to endometriosis, requiring the use of pull-ups instead of tampons or pads. She takes iron  supplements and consumes iron -rich foods.  She endorses feeling fatigued with low energy.  Recurrent staph infections are attributed to skin sensitivity and irritation from shaving requiring recurrent antibiotic use, with a dermatology appointment scheduled. She is on levothyroxine  for thyroid  management, taking it regularly every morning on an empty stomach, and takes vitamin D  once a week, with a level check due.    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    Iron  deficiency anemia    Migraines    weekly (11/16/2012)   Oligomenorrhea 03/01/2011   Ovarian cyst    PONV (postoperative nausea and vomiting)    Pregnancy induced hypertension    Schizophrenia (HCC)    moderate (11/16/2012)   Scoliosis     Past Surgical History:  Procedure Laterality Date   CESAREAN SECTION N/A 08/02/2019   Procedure: CESAREAN SECTION;  Surgeon: Kandis Devaughn Sayres, MD;  Location: [facility] ORS;  Service: Obstetrics;  Laterality: N/A;   HYSTEROSCOPY WITH D & C N/A 04/14/2022   Procedure: DILATATION AND CURETTAGE;  Surgeon: Lorence Ozell CROME, MD;  Location: [facility] OR;  Service: Gynecology;  Laterality: N/A;   LAPAROSCOPIC LYSIS OF ADHESIONS N/A 04/14/2022   Procedure: LAPAROSCOPIC LYSIS OF ADHESIONS;  Surgeon: Lorence Ozell CROME, MD;  Location: [facility] OR;  Service: Gynecology;  Laterality: N/A;   LAPAROSCOPIC OVARIAN CYSTECTOMY Left 02/11/2016   Procedure: LAPAROSCOPIC OVARIAN CYSTECTOMY;  Surgeon: Harland JAYSON Birkenhead, MD;  Location: [facility] ORS;  Service: Gynecology;  Laterality: Left;   LAPAROSCOPIC OVARIAN CYSTECTOMY Left 04/14/2022   Procedure: LAPAROSCOPIC OVARIAN CYSTECTOMY;  Surgeon: Lorence Ozell CROME, MD;  Location: [facility] OR;  Service: Gynecology;  Laterality: Left;   LAPAROSCOPY N/A 04/14/2022  Procedure: LAPAROSCOPY DIAGNOSTIC;  Surgeon: Lorence Ozell CROME, MD;  Location: [facility] OR;  Service: Gynecology;  Laterality: N/A;   UPPER GI ENDOSCOPY  ~ 2006; ~2008    Family History  Problem Relation Age of Onset   Diabetes Mother    Hypertension Mother    Vision loss Mother    Hypertension Father    Hyperlipidemia Father    Thyroid  disease Maternal  Grandmother     Social History   Socioeconomic History   Marital status: Single    Spouse name: Not on file   Number of children: 2   Years of education: Not on file   Highest education level: 12th grade  Occupational History    Comment: at vets office  Tobacco Use   Smoking status: Never   Smokeless tobacco: Never  Vaping Use   Vaping status: Never Used  Substance and Sexual Activity   Alcohol use: Never   Drug use: No   Sexual activity: Yes    Birth control/protection: None  Other Topics Concern   Not on file  Social History Narrative   Lives with children   Caffeine - maybe 1 soda a day   Social Drivers of Health   Financial Resource Strain: Medium Risk (10/11/2023)   Overall Financial Resource Strain (CARDIA)    Difficulty of Paying Living Expenses: Somewhat hard  Food Insecurity: Food Insecurity Present (10/11/2023)   Hunger Vital Sign    Worried About Running Out of Food in the Last Year: Sometimes true    Ran Out of Food in the Last Year: Sometimes true  Transportation Needs: Unknown (10/11/2023)   PRAPARE - Administrator, Civil Service (Medical): Not on file    Lack of Transportation (Non-Medical): No  Physical Activity: Insufficiently Active (10/11/2023)   Exercise Vital Sign    Days of Exercise per Week: 4 days    Minutes of Exercise per Session: 10 min  Stress: Stress Concern Present (10/11/2023)   [employer] of Occupational Health - Occupational Stress Questionnaire    Feeling of Stress: To some extent  Social Connections: Moderately Isolated (10/11/2023)   Social Connection and Isolation Panel    Frequency of Communication with Friends and Family: More than three times a week    Frequency of Social Gatherings with Friends and Family: Never    Attends Religious Services: Never    Diplomatic Services operational officer: No    Attends Engineer, structural: Not on file    Marital Status: Living with partner    Allergies   Allergen Reactions   Cold & Cough Daytime [Acetaminophen -Dm] Shortness Of Breath    Could not tolerate- orange liquid, may have contained Pseudoephedrine- I could not tolerate   Zofran  [Ondansetron ] Other (See Comments)    Muscle Spasms   Abilify [Aripiprazole] Other (See Comments)    Lethargy, unable to function   Amoxicillin-Pot Clavulanate Other (See Comments)    Stomach pains Has patient had a PCN reaction causing immediate rash, facial/tongue/throat swelling, SOB or lightheadedness with hypotension: No Has patient had a PCN reaction causing severe rash involving mucus membranes or skin necrosis: No Has patient had a PCN reaction that required hospitalization No Has patient had a PCN reaction occurring within the last 10 years: Yes If all of the above answers are NO, then may proceed with Cephalosporin use.    Latex Swelling and Other (See Comments)    Burning of skin, too   Phenergan  [Promethazine ]     'gave  me twitches' 'not able to sit still'   Tape Swelling    Swelling, burning of skin .    Zicam Cold Remedy [Homeopathic Products] Nausea And Vomiting   Keflex  [Cephalexin ] Rash and Other (See Comments)    Rash--able to take w/ Benadryl    Vicodin [Hydrocodone-Acetaminophen ] Hives and Rash    Per pt- makes her very sleepy, feels like she isn't getting her breath. States she does not have throat/tongue swelling or anaphylaxis.    Outpatient Medications Prior to Visit  Medication Sig Dispense Refill   ergocalciferol  (DRISDOL ) 1.25 MG (50000 UT) capsule Take 1 capsule (50,000 Units total) by mouth once a week. 12 capsule 1   FEROSUL 325 (65 Fe) MG tablet TAKE 1 TABLET BY MOUTH EVERY DAY. 90 tablet 1   HYDROcodone-acetaminophen  (NORCO/VICODIN) 5-325 MG tablet Take 1 tablet by mouth every 6 (six) hours as needed.     levothyroxine  (SYNTHROID ) 137 MCG tablet Take 1 tablet (137 mcg total) by mouth daily. 90 tablet 1   meclizine  (ANTIVERT ) 25 MG tablet TAKE 1 TABLET(25 MG) BY  MOUTH THREE TIMES DAILY AS NEEDED FOR DIZZINESS 90 tablet 11   meloxicam (MOBIC) 15 MG tablet Take 15 mg by mouth daily.     omeprazole  (PRILOSEC) 20 MG capsule TAKE 1 CAPSULE(20 MG) BY MOUTH DAILY 90 capsule 1   oxyCODONE -acetaminophen  (PERCOCET/ROXICET) 5-325 MG tablet Take 1 tablet by mouth every 6 (six) hours as needed.     pregabalin (LYRICA) 75 MG capsule Take 75 mg by mouth 2 (two) times daily.     FLUoxetine  (PROZAC ) 20 MG capsule TAKE 1 CAPSULE(20 MG) BY MOUTH DAILYTAKE 1 CAPSULE(20 MG) BY MOUTH DAILY 270 capsule 1   propranolol  ER (INDERAL  LA) 60 MG 24 hr capsule Take 1 capsule (60 mg total) by mouth daily. 90 capsule 1   clindamycin  (CLEOCIN ) 300 MG capsule Take 1 capsule (300 mg total) by mouth 4 (four) times daily. (Patient not taking: Reported on 10/11/2023) 28 capsule 0   metroNIDAZOLE  (METROGEL ) 1 % gel Apply topically daily. To rash on face (Patient not taking: Reported on 10/11/2023) 45 g 0   No facility-administered medications prior to visit.     ROS Review of Systems  Constitutional:  Negative for activity change and appetite change.  HENT:  Negative for sinus pressure and sore throat.   Respiratory:  Negative for chest tightness, shortness of breath and wheezing.   Cardiovascular:  Negative for chest pain and palpitations.  Gastrointestinal:  Negative for abdominal distention, abdominal pain and constipation.  Genitourinary: Negative.   Musculoskeletal:  Positive for back pain.  Skin:        See HPI  Psychiatric/Behavioral:  Negative for behavioral problems and dysphoric mood.        Positive for anxiety    Objective:  BP 105/72   Pulse 89   Ht 5' 4 (1.626 m)   Wt 270 lb 6.4 oz (122.7 kg)   SpO2 97%   BMI 46.41 kg/m      10/11/2023    3:42 PM 04/07/2023    2:31 PM 10/28/2022   11:26 AM  BP/Weight  Systolic BP 105 137 113  Diastolic BP 72 79 80  Wt. (Lbs) 270.4 285.2 278.2  BMI 46.41 kg/m2 48.95 kg/m2 47.75 kg/m2      Physical Exam Constitutional:       Appearance: She is well-developed.  Cardiovascular:     Rate and Rhythm: Normal rate.     Heart sounds: Normal heart sounds. No  murmur heard. Pulmonary:     Effort: Pulmonary effort is normal.     Breath sounds: Normal breath sounds. No wheezing or rales.  Chest:     Chest wall: No tenderness.  Abdominal:     General: Bowel sounds are normal. There is no distension.     Palpations: Abdomen is soft. There is no mass.     Tenderness: There is no abdominal tenderness.  Musculoskeletal:        General: Tenderness (TTP of thoracolumbar region) present.     Right lower leg: No edema.     Left lower leg: No edema.  Skin:    Comments: Malar erythema  Neurological:     Mental Status: She is alert and oriented to person, place, and time.  Psychiatric:        Mood and Affect: Mood normal.        Latest Ref Rng & Units 04/07/2023    3:27 PM 04/28/2022   11:06 AM 04/09/2022    8:32 AM  CMP  Glucose 70 - 99 mg/dL 896  97  899   BUN 6 - 20 mg/dL 8  9  8    Creatinine 0.57 - 1.00 mg/dL 9.50  9.42  9.36   Sodium 134 - 144 mmol/L 140  136  140   Potassium 3.5 - 5.2 mmol/L 4.3  3.6  3.7   Chloride 96 - 106 mmol/L 104  105  107   CO2 20 - 29 mmol/L 22  20  22    Calcium 8.7 - 10.2 mg/dL 9.2  8.5  9.2   Total Protein 6.0 - 8.5 g/dL 7.1  7.3    Total Bilirubin 0.0 - 1.2 mg/dL 0.9  0.2    Alkaline Phos 44 - 121 IU/L 97  111    AST 0 - 40 IU/L 21  20    ALT 0 - 32 IU/L 18  21      Lipid Panel     Component Value Date/Time   CHOL 178 06/08/2016 0936   TRIG 237 (H) 06/08/2016 0936   HDL 41 06/08/2016 0936   CHOLHDL 4.3 06/08/2016 0936   CHOLHDL 4.4 08/07/2013 1141   VLDL 29 08/07/2013 1141   LDLCALC 90 06/08/2016 0936    CBC    Component Value Date/Time   WBC 11.4 (H) 04/07/2023 1527   WBC 11.9 (H) 04/29/2022 0306   RBC 4.64 04/07/2023 1527   RBC 3.57 (L) 04/29/2022 0306   HGB 12.8 04/07/2023 1527   HCT 39.4 04/07/2023 1527   PLT 411 04/07/2023 1527   MCV 85 04/07/2023 1527    MCH 27.6 04/07/2023 1527   MCH 22.7 (L) 04/29/2022 0306   MCHC 32.5 04/07/2023 1527   MCHC 30.1 04/29/2022 0306   RDW 13.5 04/07/2023 1527   LYMPHSABS 2.8 04/07/2023 1527   MONOABS 0.9 04/28/2022 1106   EOSABS 0.3 04/07/2023 1527   BASOSABS 0.0 04/07/2023 1527    Lab Results  Component Value Date   HGBA1C 5.2 10/28/2022    Lab Results  Component Value Date   TSH 4.680 (H) 04/07/2023       Assessment & Plan Chronic back pain Chronic back pain with sensitivity and radiating pain managed with oxycodone , awaiting physical therapy. - Continue oxycodone  5 mg up to four times daily per [facility] medical - Proceed with physical therapy appointment in October.  Fatigue Heavy menstrual bleeding impacting daily activities, potential anemia, pending gynecological evaluation. - Check blood count for anemia. - Discuss  symptoms with gynecologist at upcoming appointment.  Vitamin D  deficiency Completed replacement with Drisdol  Will check levels  GAD Increased anxiety despite fluoxetine  and propranolol , therapy not pursued due to preference for in-person sessions. - Increase fluoxetine  to 40 mg daily. - Continue propranolol  as prescribed.  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 - Recommend bleach baths with a cap or two of bleach in the tub. - Consider using Hibiclens  for skin cleansing; prescription sent to pharmacy.  Hypothyroidism Hypothyroidism managed with levothyroxine , previous labs slightly elevated, adherence reported. - Check thyroid  labs to assess current function. - Continue levothyroxine  as prescribed.  General Health Maintenance Routine health maintenance requires vitamin D  level assessment. - Check vitamin D  levels.   Hypertension Controlled on propranolol      Meds ordered this encounter  Medications   FLUoxetine  (PROZAC ) 40 MG capsule    Sig: Take 1 capsule  (40 mg total) by mouth daily.    Dispense:  90 capsule    Refill:  1    Dose increase   propranolol  ER (INDERAL  LA) 60 MG 24 hr capsule    Sig: Take 1 capsule (60 mg total) by mouth daily.    Dispense:  90 capsule    Refill:  1   chlorhexidine  (HIBICLENS ) 4 % external liquid    Sig: Apply topically daily as needed.    Dispense:  946 mL    Refill:  1    Follow-up: Return in about 6 months (around 04/09/2024) for Chronic medical conditions.       Corrina Sabin, MD, FAAFP. [facility] [place], [place] [phone]   10/11/2023, 5:09 PM

## 2023-10-11 NOTE — Patient Instructions (Signed)
 VISIT SUMMARY:  Today, we discussed the management of your chronic back pain, anxiety, heavy menstrual bleeding due to endometriosis, recurrent staph infections, and hypothyroidism. We reviewed your current medications and made some adjustments to better manage your symptoms.  YOUR PLAN:  -CHRONIC BACK PAIN: Chronic back pain is ongoing pain that can range from a dull ache to a sharp sensation, often affecting daily activities. You should continue taking oxycodone  5 mg up to four times daily and proceed with your physical therapy appointment in October.  -HEAVY MENSTRUAL BLEEDING WITH ENDOMETRIOSIS: Endometriosis is a condition where tissue similar to the lining inside the uterus grows outside of it, causing heavy menstrual bleeding and cramping. We will check your blood count for anemia and you should discuss your symptoms with your gynecologist at your upcoming appointment.  -ANXIETY AND DEPRESSION: Anxiety and depression are mental health conditions that can cause feelings of worry, fear, and sadness. We have increased your fluoxetine  dose to 40 mg daily and you should continue taking propranolol  as prescribed.  -RECURRENT STAPHYLOCOCCAL SKIN AND SOFT TISSUE INFECTIONS: Recurrent staph infections are repeated skin infections caused by bacteria, often linked to skin irritation. We recommend bleach baths with a cap or two of bleach in the tub and using Hibiclens  for skin cleansing; a prescription has been sent to your pharmacy.  -HYPOTHYROIDISM DUE TO DEFECTIVE THYROID  HORMONOGENESIS: Hypothyroidism is a condition where the thyroid  gland does not produce enough hormones, leading to fatigue and other symptoms. We will check your thyroid  labs to assess current function and you should continue taking levothyroxine  as prescribed.  -GENERAL HEALTH MAINTENANCE: We will check your vitamin D  levels as part of your routine health maintenance.  INSTRUCTIONS:  Please follow up with your physical therapy  appointment in October for your back pain. Discuss your heavy menstrual bleeding and symptoms with your gynecologist at your upcoming appointment. Continue taking your medications as prescribed and follow the recommendations for managing your staph infections. We will check your blood count, thyroid , and vitamin D  levels to monitor your overall health.

## 2023-10-12 ENCOUNTER — Ambulatory Visit: Payer: Self-pay | Admitting: Family Medicine

## 2023-10-12 DIAGNOSIS — E071 Dyshormogenetic goiter: Secondary | ICD-10-CM

## 2023-10-12 LAB — CBC WITH DIFFERENTIAL/PLATELET
Basophils Absolute: 0 x10E3/uL (ref 0.0–0.2)
Basos: 0 %
EOS (ABSOLUTE): 0.3 x10E3/uL (ref 0.0–0.4)
Eos: 2 %
Hematocrit: 40.9 % (ref 34.0–46.6)
Hemoglobin: 12.9 g/dL (ref 11.1–15.9)
Immature Grans (Abs): 0 x10E3/uL (ref 0.0–0.1)
Immature Granulocytes: 0 %
Lymphocytes Absolute: 4 x10E3/uL — ABNORMAL HIGH (ref 0.7–3.1)
Lymphs: 31 %
MCH: 28.5 pg (ref 26.6–33.0)
MCHC: 31.5 g/dL (ref 31.5–35.7)
MCV: 91 fL (ref 79–97)
Monocytes Absolute: 0.7 x10E3/uL (ref 0.1–0.9)
Monocytes: 5 %
Neutrophils Absolute: 7.8 x10E3/uL — ABNORMAL HIGH (ref 1.4–7.0)
Neutrophils: 62 %
Platelets: 427 x10E3/uL (ref 150–450)
RBC: 4.52 x10E6/uL (ref 3.77–5.28)
RDW: 12.8 % (ref 11.7–15.4)
WBC: 12.8 x10E3/uL — ABNORMAL HIGH (ref 3.4–10.8)

## 2023-10-12 LAB — TSH: TSH: 4.12 u[IU]/mL (ref 0.450–4.500)

## 2023-10-12 LAB — T4, FREE: Free T4: 1.3 ng/dL (ref 0.82–1.77)

## 2023-10-12 LAB — T3: T3, Total: 119 ng/dL (ref 71–180)

## 2023-10-12 LAB — VITAMIN D 25 HYDROXY (VIT D DEFICIENCY, FRACTURES): Vit D, 25-Hydroxy: 19.7 ng/mL — ABNORMAL LOW (ref 30.0–100.0)

## 2023-10-12 MED ORDER — ERGOCALCIFEROL 1.25 MG (50000 UT) PO CAPS: 50000.0000 [IU] | ORAL_CAPSULE | ORAL | 1 refills | Status: AC

## 2023-10-12 MED ORDER — LEVOTHYROXINE SODIUM 137 MCG PO TABS: 137.0000 ug | ORAL_TABLET | Freq: Every day | ORAL | 1 refills | Status: DC

## 2023-10-21 DIAGNOSIS — M549 Dorsalgia, unspecified: Secondary | ICD-10-CM | POA: Diagnosis not present

## 2023-10-21 DIAGNOSIS — Z79899 Other long term (current) drug therapy: Secondary | ICD-10-CM | POA: Diagnosis not present

## 2023-10-21 DIAGNOSIS — Z6841 Body Mass Index (BMI) 40.0 and over, adult: Secondary | ICD-10-CM | POA: Diagnosis not present

## 2023-10-21 DIAGNOSIS — Z3200 Encounter for pregnancy test, result unknown: Secondary | ICD-10-CM | POA: Diagnosis not present

## 2023-10-27 DIAGNOSIS — Z79899 Other long term (current) drug therapy: Secondary | ICD-10-CM | POA: Diagnosis not present

## 2023-10-31 DIAGNOSIS — Z419 Encounter for procedure for purposes other than remedying health state, unspecified: Secondary | ICD-10-CM | POA: Diagnosis not present

## 2023-10-31 DIAGNOSIS — R32 Unspecified urinary incontinence: Secondary | ICD-10-CM | POA: Diagnosis not present

## 2023-10-31 IMAGING — US US PELVIS COMPLETE
1 series · 14 of 25 positions shown · non-contrast
Comparison: 10/23/2020.

CLINICAL DATA: Cyst of left ovary.

EXAM:
TRANSABDOMINAL ULTRASOUND OF PELVIS
TECHNIQUE: Transabdominal ultrasound examination of the pelvis was performed
including evaluation of the uterus, ovaries, adnexal regions, and
pelvic cul-de-sac. Patient refused transvaginal exam.

[Series 1: us pelvic complete with transvaginal · 14 of 68 slices shown]
[im 1/68]
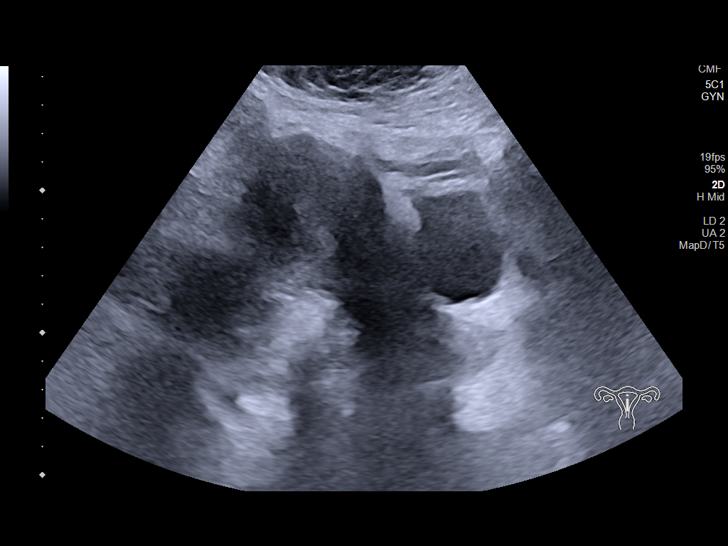
[im 6/68]
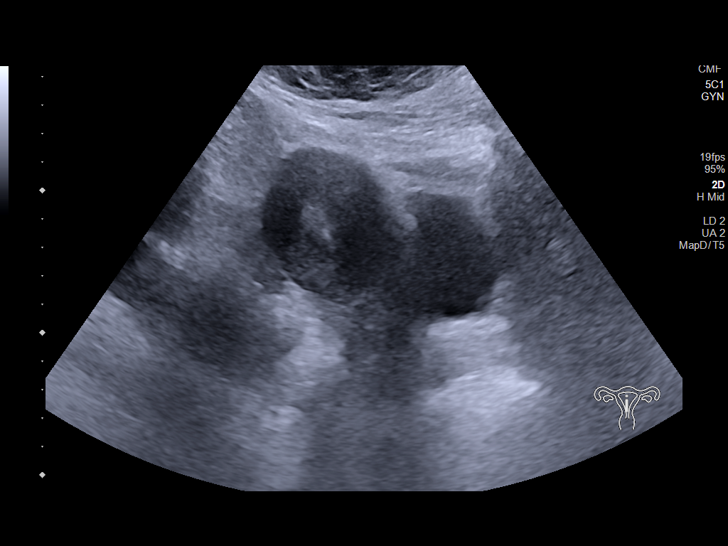
[im 12/68]
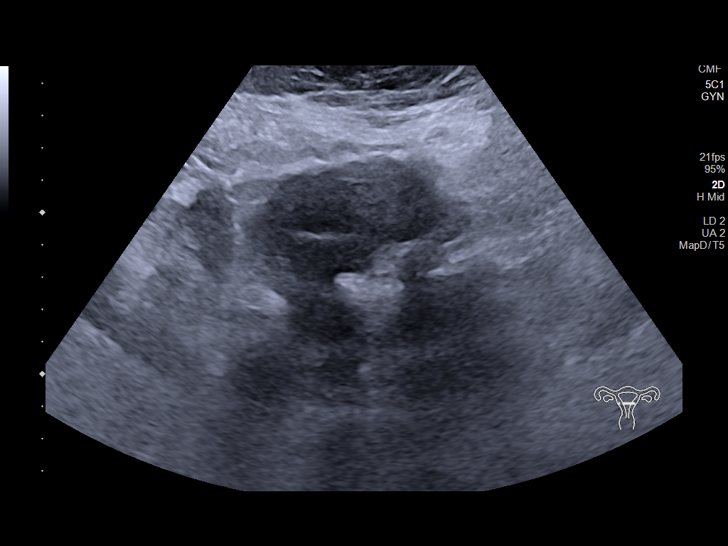
[im 17/68]
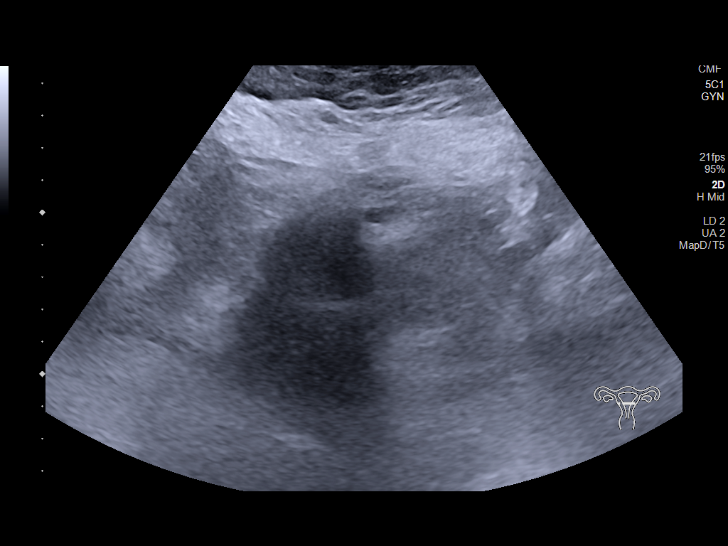
[im 23/68]
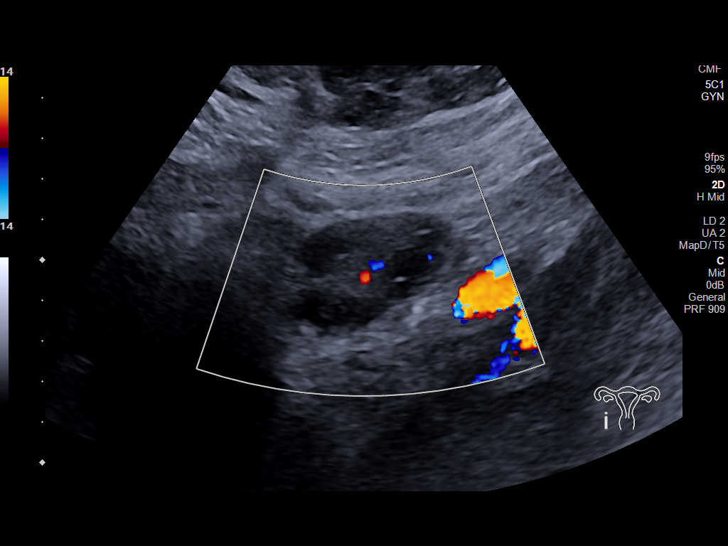
[im 26/68]
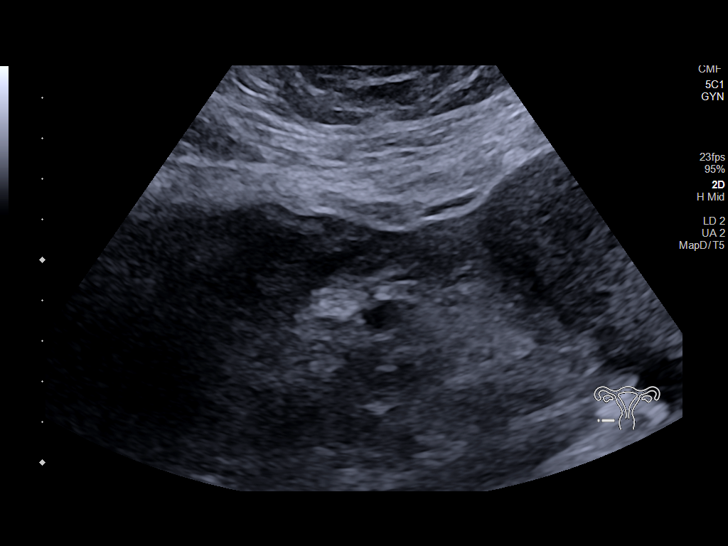
[im 31/68]
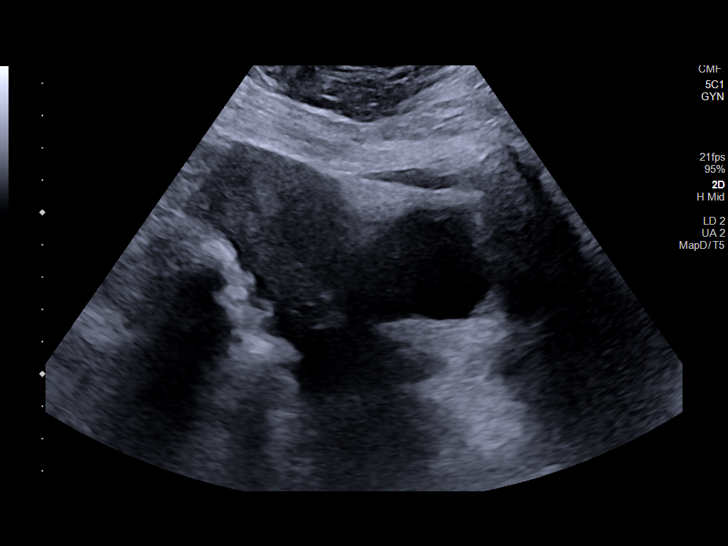
[im 37/68]
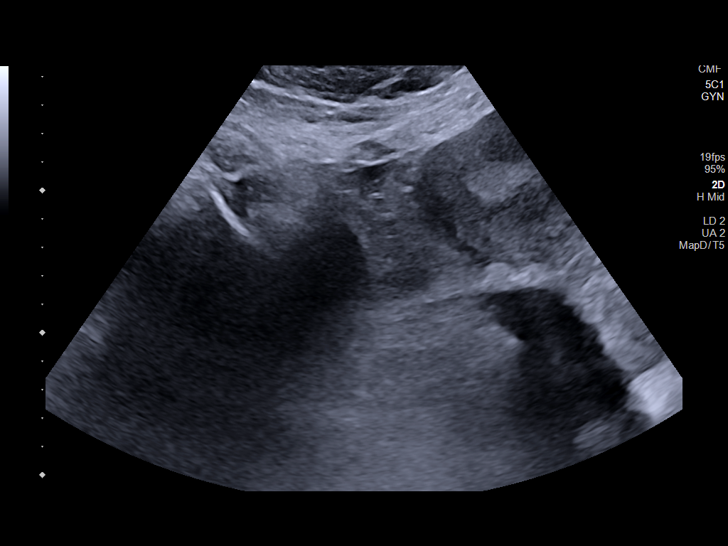
[im 42/68]
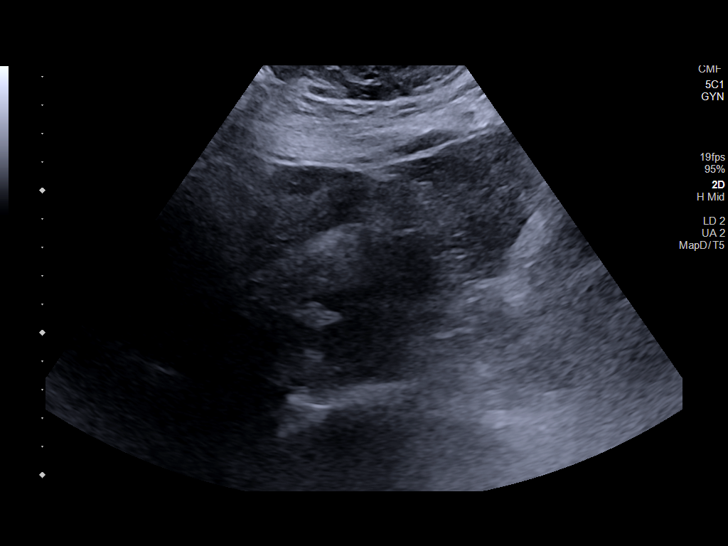
[im 45/68]
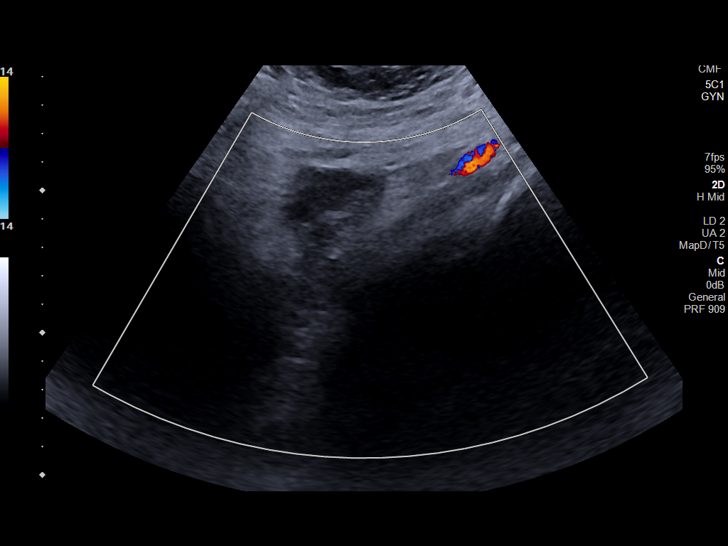
[im 51/68]
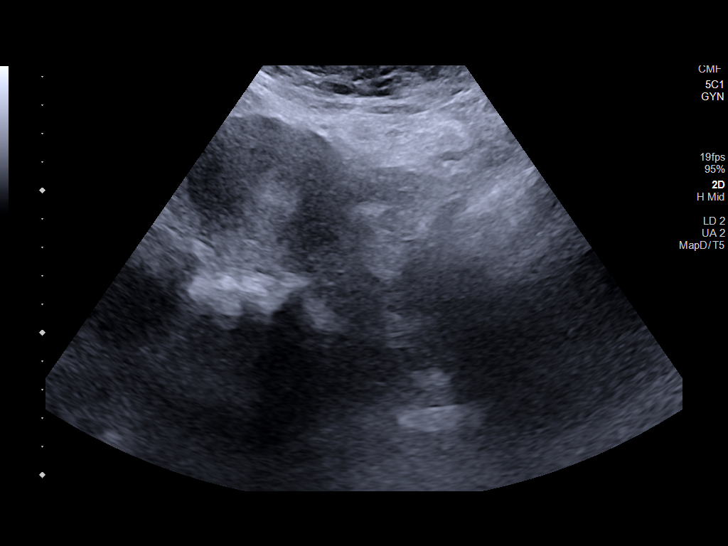
[im 56/68]
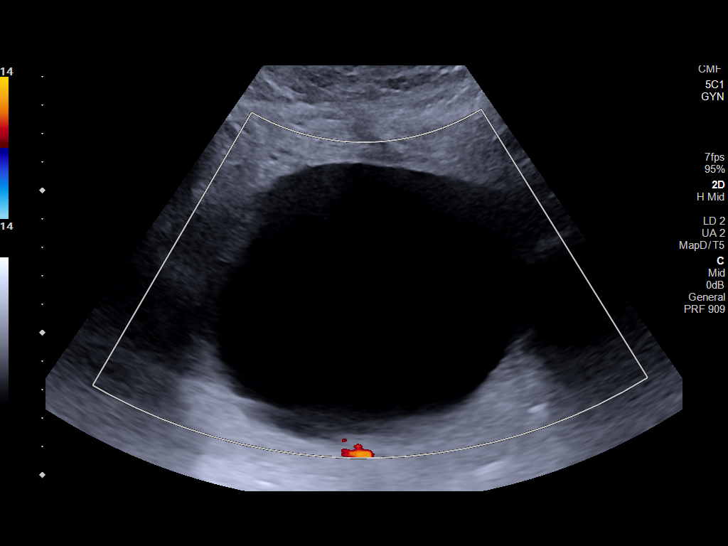
[im 62/68]
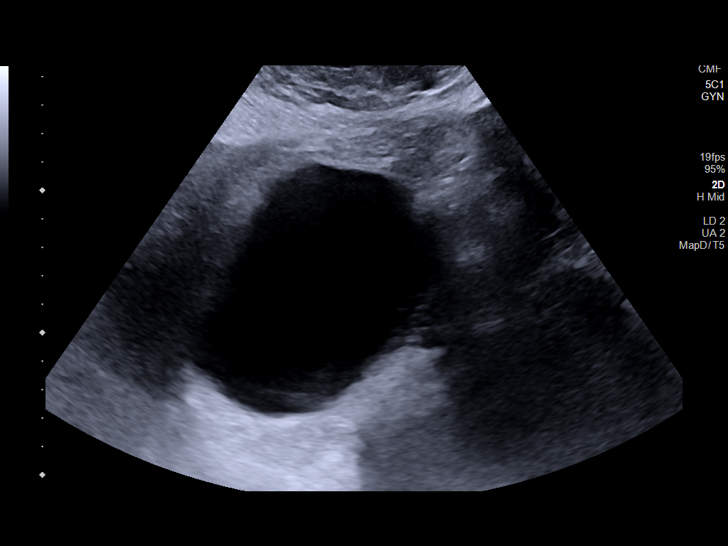
[im 68/68]
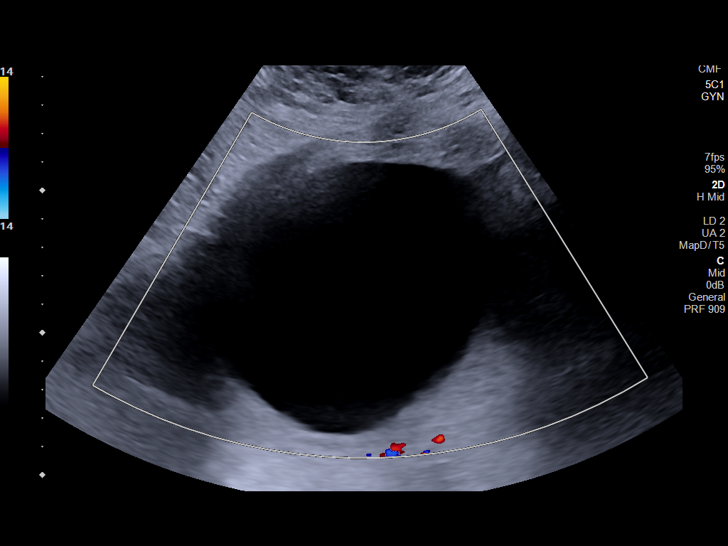

[14 of 25 positions shown; findings below may reference images not displayed]

FINDINGS: Uterus

Measurements: 10.4 x 5.2 x 6.9 cm = volume: 195.87 mL. No fibroids
or other mass visualized.

Endometrium

Thickness: 6 mm.  No focal abnormality visualized.

Right ovary

Measurements: 4.6 x 2.6 x 4.0 cm = volume: 25.5 mL. Normal
appearance/no adnexal mass.

Left ovary

Left ovary is not visualized on exam. A simple cystic structure is
present in the left adnexa measuring 11.9 x 10.3 x 9.3 cm.

Other findings:  No abnormal free fluid.
IMPRESSION: 1. Cystic structure in the left ovary measuring 11.9 x 10.3 x
cm, slightly increased in size from the prior exam. OBGYN
consultation and/or MRI is recommended for further evaluation.
2. The uterus and right ovary are within normal limits.

## 2023-11-06 ENCOUNTER — Encounter: Payer: Self-pay | Admitting: Family Medicine

## 2023-11-08 ENCOUNTER — Other Ambulatory Visit: Payer: Self-pay | Admitting: Family Medicine

## 2023-11-08 DIAGNOSIS — R5383 Other fatigue: Secondary | ICD-10-CM | POA: Diagnosis not present

## 2023-11-08 DIAGNOSIS — M4716 Other spondylosis with myelopathy, lumbar region: Secondary | ICD-10-CM | POA: Diagnosis not present

## 2023-11-08 DIAGNOSIS — E66813 Obesity, class 3: Secondary | ICD-10-CM | POA: Diagnosis not present

## 2023-11-08 DIAGNOSIS — E071 Dyshormogenetic goiter: Secondary | ICD-10-CM

## 2023-11-08 DIAGNOSIS — Z6841 Body Mass Index (BMI) 40.0 and over, adult: Secondary | ICD-10-CM | POA: Diagnosis not present

## 2023-11-08 DIAGNOSIS — Z79899 Other long term (current) drug therapy: Secondary | ICD-10-CM | POA: Diagnosis not present

## 2023-11-09 ENCOUNTER — Ambulatory Visit

## 2023-11-15 ENCOUNTER — Other Ambulatory Visit: Payer: Self-pay | Admitting: Family Medicine

## 2023-11-15 MED ORDER — MISC. DEVICES MISC: 0 refills | Status: DC

## 2023-11-15 NOTE — Telephone Encounter (Signed)
 Can you please print Prescription as I have updated the size? Thanks

## 2023-11-15 NOTE — Telephone Encounter (Signed)
 New script is needed for new size of incontinence supplies

## 2023-11-16 ENCOUNTER — Other Ambulatory Visit: Payer: Self-pay

## 2023-11-16 MED ORDER — MISC. DEVICES MISC: 0 refills | Status: AC

## 2023-12-01 ENCOUNTER — Other Ambulatory Visit: Payer: Self-pay | Admitting: Family Medicine

## 2023-12-01 DIAGNOSIS — R32 Unspecified urinary incontinence: Secondary | ICD-10-CM | POA: Diagnosis not present

## 2023-12-01 DIAGNOSIS — D508 Other iron deficiency anemias: Secondary | ICD-10-CM

## 2023-12-20 ENCOUNTER — Other Ambulatory Visit: Payer: Self-pay | Admitting: Family Medicine

## 2023-12-20 ENCOUNTER — Encounter: Payer: Self-pay | Admitting: Family Medicine

## 2023-12-20 DIAGNOSIS — M545 Low back pain, unspecified: Secondary | ICD-10-CM

## 2023-12-20 DIAGNOSIS — H538 Other visual disturbances: Secondary | ICD-10-CM

## 2023-12-20 DIAGNOSIS — I10 Essential (primary) hypertension: Secondary | ICD-10-CM

## 2023-12-20 DIAGNOSIS — E071 Dyshormogenetic goiter: Secondary | ICD-10-CM

## 2023-12-20 DIAGNOSIS — F32A Depression, unspecified: Secondary | ICD-10-CM

## 2023-12-20 DIAGNOSIS — G8929 Other chronic pain: Secondary | ICD-10-CM | POA: Diagnosis not present

## 2023-12-20 MED ORDER — PROPRANOLOL HCL ER 60 MG PO CP24: 60.0000 mg | ORAL_CAPSULE | Freq: Every day | ORAL | 0 refills | Status: AC

## 2023-12-20 MED ORDER — LEVOTHYROXINE SODIUM 137 MCG PO TABS: 137.0000 ug | ORAL_TABLET | Freq: Every day | ORAL | 0 refills | Status: AC

## 2023-12-21 DIAGNOSIS — E66813 Obesity, class 3: Secondary | ICD-10-CM | POA: Diagnosis not present

## 2023-12-21 DIAGNOSIS — M4716 Other spondylosis with myelopathy, lumbar region: Secondary | ICD-10-CM | POA: Diagnosis not present

## 2023-12-21 DIAGNOSIS — Z79899 Other long term (current) drug therapy: Secondary | ICD-10-CM | POA: Diagnosis not present

## 2023-12-21 DIAGNOSIS — Z6841 Body Mass Index (BMI) 40.0 and over, adult: Secondary | ICD-10-CM | POA: Diagnosis not present

## 2023-12-22 NOTE — Telephone Encounter (Signed)
Please see the MyChart message reply(ies) for my assessment and plan.    This patient gave consent for this Medical Advice Message and is aware that it may result in a bill to their insurance company, as well as the possibility of receiving a bill for a co-payment or deductible. They are an established patient, but are not seeking medical advice exclusively about a problem treated during an in person or video visit in the last seven days. I did not recommend an in person or video visit within seven days of my reply.    I spent a total of 11 minutes cumulative time within 7 days through MyChart messaging.  Camila Maita, MD   

## 2023-12-31 DIAGNOSIS — Z419 Encounter for procedure for purposes other than remedying health state, unspecified: Secondary | ICD-10-CM | POA: Diagnosis not present

## 2024-01-03 DIAGNOSIS — R32 Unspecified urinary incontinence: Secondary | ICD-10-CM | POA: Diagnosis not present

## 2024-01-11 ENCOUNTER — Ambulatory Visit: Admitting: Physical Medicine and Rehabilitation

## 2024-01-21 ENCOUNTER — Encounter: Payer: Self-pay | Admitting: Family Medicine

## 2024-01-28 ENCOUNTER — Telehealth: Payer: Self-pay

## 2024-01-28 NOTE — Telephone Encounter (Signed)
 Form has been faxed to [employer] today 01/28/24       Copied from CRM #[id number]. Topic: General - Other >> Jan 28, 2024  8:35 AM Gustabo D wrote: [employer] will be sending a fax for incontinence supplies please be on the look out for it Callback [phone]

## 2024-02-02 ENCOUNTER — Ambulatory Visit (HOSPITAL_COMMUNITY)

## 2024-02-28 ENCOUNTER — Ambulatory Visit (HOSPITAL_COMMUNITY)

## 2024-04-03 ENCOUNTER — Ambulatory Visit (HOSPITAL_COMMUNITY)

## 2024-04-10 ENCOUNTER — Ambulatory Visit: Admitting: Family Medicine

## 2024-04-12 ENCOUNTER — Ambulatory Visit (HOSPITAL_COMMUNITY)
